# Patient Record
Sex: Female | Born: 1955
Health system: Southern US, Community
[De-identification: ages and names within clinical notes are randomized; demographics above are authoritative.]

## PROBLEM LIST (undated history)

## (undated) DIAGNOSIS — M069 Rheumatoid arthritis, unspecified: Secondary | ICD-10-CM

## (undated) DIAGNOSIS — R202 Paresthesia of skin: Secondary | ICD-10-CM

## (undated) DIAGNOSIS — G709 Myoneural disorder, unspecified: Secondary | ICD-10-CM

## (undated) DIAGNOSIS — F419 Anxiety disorder, unspecified: Secondary | ICD-10-CM

## (undated) DIAGNOSIS — K644 Residual hemorrhoidal skin tags: Secondary | ICD-10-CM

## (undated) DIAGNOSIS — M1990 Unspecified osteoarthritis, unspecified site: Secondary | ICD-10-CM

## (undated) DIAGNOSIS — I509 Heart failure, unspecified: Secondary | ICD-10-CM

## (undated) DIAGNOSIS — R42 Dizziness and giddiness: Secondary | ICD-10-CM

## (undated) DIAGNOSIS — K219 Gastro-esophageal reflux disease without esophagitis: Secondary | ICD-10-CM

## (undated) DIAGNOSIS — E785 Hyperlipidemia, unspecified: Secondary | ICD-10-CM

## (undated) DIAGNOSIS — D649 Anemia, unspecified: Secondary | ICD-10-CM

## (undated) DIAGNOSIS — J45909 Unspecified asthma, uncomplicated: Secondary | ICD-10-CM

## (undated) DIAGNOSIS — I10 Essential (primary) hypertension: Secondary | ICD-10-CM

## (undated) DIAGNOSIS — I429 Cardiomyopathy, unspecified: Secondary | ICD-10-CM

## (undated) DIAGNOSIS — R200 Anesthesia of skin: Secondary | ICD-10-CM

## (undated) DIAGNOSIS — E119 Type 2 diabetes mellitus without complications: Secondary | ICD-10-CM

## (undated) DIAGNOSIS — R350 Frequency of micturition: Secondary | ICD-10-CM

## (undated) HISTORY — DX: Heart failure, unspecified: I50.9

## (undated) HISTORY — PX: ABDOMINAL HYSTERECTOMY: SHX81

## (undated) HISTORY — DX: Type 2 diabetes mellitus without complications: E11.9

## (undated) HISTORY — PX: COLONOSCOPY: SHX174

## (undated) HISTORY — DX: Essential (primary) hypertension: I10

## (undated) HISTORY — DX: Unspecified asthma, uncomplicated: J45.909

## (undated) HISTORY — PX: CARPAL TUNNEL RELEASE: SHX101

## (undated) HISTORY — DX: Myoneural disorder, unspecified: G70.9

## (undated) HISTORY — PX: TUBAL LIGATION: SHX77

## (undated) HISTORY — DX: Cardiomyopathy, unspecified: I42.9

## (undated) HISTORY — PX: COLONOSCOPY W/ POLYPECTOMY: SHX1380

## (undated) HISTORY — PX: HEMORROIDECTOMY: SUR656

## (undated) HISTORY — DX: Hyperlipidemia, unspecified: E78.5

## (undated) HISTORY — DX: Gastro-esophageal reflux disease without esophagitis: K21.9

## (undated) HISTORY — PX: CYST REMOVAL NECK: SHX6281

---

## 2001-08-18 LAB — HM DIABETES EYE EXAM

## 2013-01-15 ENCOUNTER — Telehealth: Payer: Self-pay | Admitting: Internal Medicine

## 2013-01-15 NOTE — Telephone Encounter (Signed)
Received records from Dr.Hoshino;sending 71 pages to Dr.Jones

## 2013-01-30 ENCOUNTER — Ambulatory Visit (INDEPENDENT_AMBULATORY_CARE_PROVIDER_SITE_OTHER): Payer: BC Managed Care – PPO

## 2013-01-30 ENCOUNTER — Ambulatory Visit (INDEPENDENT_AMBULATORY_CARE_PROVIDER_SITE_OTHER): Payer: BC Managed Care – PPO | Admitting: Internal Medicine

## 2013-01-30 ENCOUNTER — Encounter: Payer: Self-pay | Admitting: Internal Medicine

## 2013-01-30 VITALS — BP 128/86 | HR 80 | Temp 98.6°F | Resp 16 | Ht 65.0 in | Wt 221.0 lb

## 2013-01-30 DIAGNOSIS — I10 Essential (primary) hypertension: Secondary | ICD-10-CM

## 2013-01-30 DIAGNOSIS — E785 Hyperlipidemia, unspecified: Secondary | ICD-10-CM

## 2013-01-30 DIAGNOSIS — E876 Hypokalemia: Secondary | ICD-10-CM

## 2013-01-30 DIAGNOSIS — IMO0002 Reserved for concepts with insufficient information to code with codable children: Secondary | ICD-10-CM

## 2013-01-30 DIAGNOSIS — Z23 Encounter for immunization: Secondary | ICD-10-CM

## 2013-01-30 DIAGNOSIS — M48061 Spinal stenosis, lumbar region without neurogenic claudication: Secondary | ICD-10-CM | POA: Insufficient documentation

## 2013-01-30 DIAGNOSIS — Z860100 Personal history of colon polyps, unspecified: Secondary | ICD-10-CM | POA: Insufficient documentation

## 2013-01-30 DIAGNOSIS — M5416 Radiculopathy, lumbar region: Secondary | ICD-10-CM

## 2013-01-30 DIAGNOSIS — F1220 Cannabis dependence, uncomplicated: Secondary | ICD-10-CM

## 2013-01-30 DIAGNOSIS — E118 Type 2 diabetes mellitus with unspecified complications: Secondary | ICD-10-CM | POA: Insufficient documentation

## 2013-01-30 DIAGNOSIS — IMO0001 Reserved for inherently not codable concepts without codable children: Secondary | ICD-10-CM

## 2013-01-30 DIAGNOSIS — N1832 Chronic kidney disease, stage 3b: Secondary | ICD-10-CM | POA: Insufficient documentation

## 2013-01-30 DIAGNOSIS — Z87898 Personal history of other specified conditions: Secondary | ICD-10-CM

## 2013-01-30 LAB — CBC WITH DIFFERENTIAL/PLATELET
Basophils Absolute: 0.1 10*3/uL (ref 0.0–0.1)
Eosinophils Absolute: 0.2 10*3/uL (ref 0.0–0.7)
HCT: 35.7 % — ABNORMAL LOW (ref 36.0–46.0)
Lymphs Abs: 2.7 10*3/uL (ref 0.7–4.0)
MCV: 92.3 fl (ref 78.0–100.0)
Monocytes Absolute: 0.8 10*3/uL (ref 0.1–1.0)
Platelets: 260 10*3/uL (ref 150.0–400.0)
RDW: 14.1 % (ref 11.5–14.6)

## 2013-01-30 LAB — URINALYSIS, ROUTINE W REFLEX MICROSCOPIC
Specific Gravity, Urine: >=1.03 (ref 1.000–1.030)
Total Protein, Urine: NEGATIVE
Urine Glucose: NEGATIVE
Urobilinogen, UA: 0.2 (ref 0.0–1.0)

## 2013-01-30 LAB — LIPID PANEL
Cholesterol: 245 mg/dL — ABNORMAL HIGH (ref 0–200)
Triglycerides: 197 mg/dL — ABNORMAL HIGH (ref 0.0–149.0)

## 2013-01-30 LAB — COMPREHENSIVE METABOLIC PANEL
ALT: 23 U/L (ref 0–35)
Alkaline Phosphatase: 85 U/L (ref 39–117)
Sodium: 140 mEq/L (ref 135–145)
Total Bilirubin: 0.6 mg/dL (ref 0.3–1.2)
Total Protein: 7.4 g/dL (ref 6.0–8.3)

## 2013-01-30 LAB — MICROALBUMIN / CREATININE URINE RATIO: Microalb Creat Ratio: 0.9 mg/g (ref 0.0–30.0)

## 2013-01-30 LAB — HEMOGLOBIN A1C: Hgb A1c MFr Bld: 6.8 % — ABNORMAL HIGH (ref 4.6–6.5)

## 2013-01-30 LAB — LDL CHOLESTEROL, DIRECT: Direct LDL: 162.5 mg/dL

## 2013-01-30 MED ORDER — HYDROCODONE-ACETAMINOPHEN 5-325 MG PO TABS: 1.0000 | ORAL_TABLET | Freq: Four times a day (QID) | ORAL | Status: DC | PRN

## 2013-01-30 NOTE — Progress Notes (Signed)
Subjective:    Patient ID: Emily Wheeler, female    DOB: May 11, 1956, 57 y.o.   MRN: [id number]  Back Pain This is a chronic problem. The current episode started more than 1 year ago. The problem occurs intermittently. The problem has been gradually worsening since onset. The pain is present in the lumbar spine. The quality of the pain is described as burning, aching, shooting and stabbing. The pain radiates to the left thigh. The pain is at a severity of 5/10. The pain is moderate. The pain is worse during the day. The symptoms are aggravated by bending, position, standing and twisting. Associated symptoms include leg pain (left), numbness (left foot) and paresthesias (LLE). Pertinent negatives include no abdominal pain, bladder incontinence, bowel incontinence, chest pain, dysuria, fever, headaches, paresis, pelvic pain, perianal numbness, tingling, weakness or weight loss. She has tried NSAIDs and muscle relaxant for the symptoms. The treatment provided mild relief.      Review of Systems  Constitutional: Negative.  Negative for fever, chills, weight loss, diaphoresis, activity change, appetite change and fatigue.  HENT: Negative.   Eyes: Negative.   Respiratory: Negative.  Negative for cough, choking, chest tightness, shortness of breath, wheezing and stridor.   Cardiovascular: Negative for chest pain, palpitations and leg swelling.  Gastrointestinal: Negative.  Negative for nausea, vomiting, abdominal pain, diarrhea, constipation, blood in stool and bowel incontinence.  Endocrine: Negative.  Negative for polydipsia, polyphagia and polyuria.  Genitourinary: Negative.  Negative for bladder incontinence, dysuria and pelvic pain.  Musculoskeletal: Positive for back pain. Negative for myalgias, joint swelling, arthralgias and gait problem.  Skin: Negative.   Allergic/Immunologic: Negative.   Neurological: Positive for numbness (left foot) and paresthesias (LLE). Negative for dizziness,  tingling, tremors, seizures, syncope, facial asymmetry, speech difficulty, weakness, light-headedness and headaches.  Hematological: Negative.  Negative for adenopathy. Does not bruise/bleed easily.  Psychiatric/Behavioral: Negative.        Objective:   Physical Exam  Vitals reviewed. Constitutional: She is oriented to person, place, and time. She appears well-developed and well-nourished. No distress.  HENT:  Head: Normocephalic and atraumatic.  Mouth/Throat: Oropharynx is clear and moist. No oropharyngeal exudate.  Eyes: Conjunctivae are normal. Right eye exhibits no discharge. Left eye exhibits no discharge. No scleral icterus.  Neck: Normal range of motion. Neck supple. No JVD present. No tracheal deviation present. No thyromegaly present.  Cardiovascular: Normal rate, regular rhythm, normal heart sounds and intact distal pulses.  Exam reveals no gallop and no friction rub.   No murmur heard. Pulmonary/Chest: Effort normal and breath sounds normal. No stridor. No respiratory distress. She has no wheezes. She has no rales. She exhibits no tenderness.  Abdominal: Soft. Bowel sounds are normal. She exhibits no distension and no mass. There is no tenderness. There is no rebound and no guarding.  Musculoskeletal: Normal range of motion. She exhibits no edema and no tenderness.       Lumbar back: Normal. She exhibits normal range of motion, no tenderness, no bony tenderness, no swelling, no edema, no deformity, no laceration, no pain, no spasm and normal pulse.  Lymphadenopathy:    She has no cervical adenopathy.  Neurological: She is alert and oriented to person, place, and time. She has normal strength. She displays no atrophy, no tremor and normal reflexes. No cranial nerve deficit or sensory deficit. She exhibits normal muscle tone. She displays a negative Romberg sign. She displays no seizure activity. Coordination and gait normal. She displays no Babinski's sign on the right side.  She  displays no Babinski's sign on the left side.  Reflex Scores:      Tricep reflexes are 1+ on the right side and 1+ on the left side.      Bicep reflexes are 1+ on the right side and 1+ on the left side.      Brachioradialis reflexes are 1+ on the right side and 1+ on the left side.      Patellar reflexes are 1+ on the right side and 1+ on the left side.      Achilles reflexes are 1+ on the right side and 1+ on the left side. Neg SLR in BLE  Skin: Skin is warm and dry. No rash noted. She is not diaphoretic. No erythema. No pallor.  Psychiatric: She has a normal mood and affect. Her behavior is normal. Judgment and thought content normal.     No results found for this basename: WBC, HGB, HCT, PLT, GLUCOSE, CHOL, TRIG, HDL, LDLDIRECT, LDLCALC, ALT, AST, NA, K, CL, CREATININE, BUN, CO2, TSH, PSA, INR, GLUF, HGBA1C, MICROALBUR       Assessment & Plan:

## 2013-01-30 NOTE — Patient Instructions (Signed)
Back Pain, Adult  Low back pain is very common. About 1 in 5 people have back pain. The cause of low back pain is rarely dangerous. The pain often gets better over time. About half of people with a sudden onset of back pain feel better in just 2 weeks. About 8 in 10 people feel better by 6 weeks.   CAUSES  Some common causes of back pain include:  · Strain of the muscles or ligaments supporting the spine.  · Wear and tear (degeneration) of the spinal discs.  · Arthritis.  · Direct injury to the back.  DIAGNOSIS  Most of the time, the direct cause of low back pain is not known. However, back pain can be treated effectively even when the exact cause of the pain is unknown. Answering your caregiver's questions about your overall health and symptoms is one of the most accurate ways to make sure the cause of your pain is not dangerous. If your caregiver needs more information, he or she may order lab work or imaging tests (X-rays or MRIs). However, even if imaging tests show changes in your back, this usually does not require surgery.  HOME CARE INSTRUCTIONS  For many people, back pain returns. Since low back pain is rarely dangerous, it is often a condition that people can learn to manage on their own.   · Remain active. It is stressful on the back to sit or stand in one place. Do not sit, drive, or stand in one place for more than 30 minutes at a time. Take short walks on level surfaces as soon as pain allows. Try to increase the length of time you walk each day.  · Do not stay in bed. Resting more than 1 or 2 days can delay your recovery.  · Do not avoid exercise or work. Your body is made to move. It is not dangerous to be active, even though your back may hurt. Your back will likely heal faster if you return to being active before your pain is gone.  · Pay attention to your body when you  bend and lift. Many people have less discomfort when lifting if they bend their knees, keep the load close to their bodies, and  avoid twisting. Often, the most comfortable positions are those that put less stress on your recovering back.  · Find a comfortable position to sleep. Use a firm mattress and lie on your side with your knees slightly bent. If you lie on your back, put a pillow under your knees.  · Only take over-the-counter or prescription medicines as directed by your caregiver. Over-the-counter medicines to reduce pain and inflammation are often the most helpful. Your caregiver may prescribe muscle relaxant drugs. These medicines help dull your pain so you can more quickly return to your normal activities and healthy exercise.  · Put ice on the injured area.  · Put ice in a plastic bag.  · Place a towel between your skin and the bag.  · Leave the ice on for 15-20 minutes, 3-4 times a day for the first 2 to 3 days. After that, ice and heat may be alternated to reduce pain and spasms.  · Ask your caregiver about trying back exercises and gentle massage. This may be of some benefit.  · Avoid feeling anxious or stressed. Stress increases muscle tension and can worsen back pain. It is important to recognize when you are anxious or stressed and learn ways to manage it. Exercise is a great option.  SEEK MEDICAL CARE IF:  · You have pain that is not relieved with rest or   medicine.  · You have pain that does not improve in 1 week.  · You have new symptoms.  · You are generally not feeling well.  SEEK IMMEDIATE MEDICAL CARE IF:   · You have pain that radiates from your back into your legs.  · You develop new bowel or bladder control problems.  · You have unusual weakness or numbness in your arms or legs.  · You develop nausea or vomiting.  · You develop abdominal pain.  · You feel faint.  Document Released: 05/14/2005 Document Revised: 11/13/2011 Document Reviewed: 10/02/2010  Exit[employer]® Patient Information ©2014 [employer].

## 2013-01-31 ENCOUNTER — Encounter: Payer: Self-pay | Admitting: Internal Medicine

## 2013-01-31 DIAGNOSIS — Z23 Encounter for immunization: Secondary | ICD-10-CM | POA: Insufficient documentation

## 2013-01-31 DIAGNOSIS — E876 Hypokalemia: Secondary | ICD-10-CM | POA: Insufficient documentation

## 2013-01-31 DIAGNOSIS — F1220 Cannabis dependence, uncomplicated: Secondary | ICD-10-CM | POA: Insufficient documentation

## 2013-01-31 LAB — DRUGS OF ABUSE SCREEN W/O ALC, ROUTINE URINE
Amphetamine Screen, Ur: NEGATIVE
Barbiturate Quant, Ur: NEGATIVE
Benzodiazepines.: NEGATIVE
Cocaine Metabolites: NEGATIVE
Phencyclidine (PCP): NEGATIVE

## 2013-01-31 MED ORDER — POTASSIUM CHLORIDE CRYS ER 20 MEQ PO TBCR: 20.0000 meq | EXTENDED_RELEASE_TABLET | Freq: Three times a day (TID) | ORAL | Status: DC

## 2013-01-31 NOTE — Assessment & Plan Note (Signed)
Her A1C shows good control and her renal function is ok She needs to have an eye exam done

## 2013-01-31 NOTE — Assessment & Plan Note (Signed)
She has radicular s/s so I have ordered an MRI to see if she has HNP, mass/tumor, spinal stenosis, nerve impingement She will add norco for additional pain relief

## 2013-01-31 NOTE — Assessment & Plan Note (Signed)
She is not interested in cessation

## 2013-01-31 NOTE — Assessment & Plan Note (Signed)
I have asked her to get a f/up cononscopy

## 2013-01-31 NOTE — Assessment & Plan Note (Signed)
This is due to the HCTZ She will start K+ replacement therapy

## 2013-01-31 NOTE — Assessment & Plan Note (Signed)
F/up mammogram ordered

## 2013-01-31 NOTE — Assessment & Plan Note (Signed)
Her BP is well controlled Lytes and renal function are normal 

## 2013-02-02 ENCOUNTER — Telehealth: Payer: Self-pay | Admitting: Internal Medicine

## 2013-02-02 NOTE — Telephone Encounter (Signed)
rec'd records from [facility], Forwarding 11pgs to Dr.Jones

## 2013-02-03 LAB — CANNABANOIDS (GC/LC/MS), URINE: THC-COOH (GC/LC/MS), ur confirm: 958 ng/mL

## 2013-02-08 ENCOUNTER — Ambulatory Visit
Admission: RE | Admit: 2013-02-08 | Discharge: 2013-02-08 | Disposition: A | Discharge status: Home / Self Care | Payer: BC Managed Care – PPO | Source: Ambulatory Visit | Attending: Internal Medicine | Admitting: Internal Medicine

## 2013-02-08 DIAGNOSIS — M5416 Radiculopathy, lumbar region: Secondary | ICD-10-CM

## 2013-02-09 ENCOUNTER — Other Ambulatory Visit: Payer: Self-pay | Admitting: Internal Medicine

## 2013-02-09 ENCOUNTER — Encounter: Payer: Self-pay | Admitting: Internal Medicine

## 2013-02-09 ENCOUNTER — Telehealth: Payer: Self-pay

## 2013-02-09 DIAGNOSIS — Z87898 Personal history of other specified conditions: Secondary | ICD-10-CM

## 2013-02-09 DIAGNOSIS — M5416 Radiculopathy, lumbar region: Secondary | ICD-10-CM

## 2013-02-09 MED ORDER — NIFEDIPINE ER OSMOTIC RELEASE 30 MG PO TB24: 30.0000 mg | ORAL_TABLET | Freq: Every day | ORAL | Status: DC

## 2013-02-09 MED ORDER — ATENOLOL 100 MG PO TABS: 100.0000 mg | ORAL_TABLET | Freq: Every day | ORAL | Status: DC

## 2013-02-09 NOTE — Telephone Encounter (Signed)
[facility] w/ [facility] called [facility] stating that original order for mammogram must be changed to diagnostic in order to scheduled. Per [facility], due to hx of abnormal mammogram, they will need to know where last scan was done an d need report.   Order has been changed, routing message to [facility] for other information needed for scheduling. Thanks

## 2013-02-18 ENCOUNTER — Encounter: Payer: Self-pay | Admitting: Gastroenterology

## 2013-02-27 ENCOUNTER — Encounter: Payer: Self-pay | Admitting: Internal Medicine

## 2013-02-27 ENCOUNTER — Ambulatory Visit (INDEPENDENT_AMBULATORY_CARE_PROVIDER_SITE_OTHER): Payer: BC Managed Care – PPO | Admitting: Internal Medicine

## 2013-02-27 VITALS — BP 148/78 | HR 78 | Temp 98.4°F | Resp 16 | Ht 65.0 in | Wt 216.0 lb

## 2013-02-27 DIAGNOSIS — L089 Local infection of the skin and subcutaneous tissue, unspecified: Secondary | ICD-10-CM | POA: Insufficient documentation

## 2013-02-27 DIAGNOSIS — IMO0001 Reserved for inherently not codable concepts without codable children: Secondary | ICD-10-CM

## 2013-02-27 DIAGNOSIS — L723 Sebaceous cyst: Secondary | ICD-10-CM

## 2013-02-27 DIAGNOSIS — M069 Rheumatoid arthritis, unspecified: Secondary | ICD-10-CM

## 2013-02-27 MED ORDER — LEFLUNOMIDE 20 MG PO TABS: 20.0000 mg | ORAL_TABLET | Freq: Every day | ORAL | Status: DC

## 2013-02-27 NOTE — Assessment & Plan Note (Signed)
It does not appear to be infected GS referral for exicsion

## 2013-02-27 NOTE — Assessment & Plan Note (Signed)
She will continue the current meds for now I have asker her to establish with a rheumatologist here

## 2013-02-27 NOTE — Assessment & Plan Note (Signed)
Her blood sugars are well controlled 

## 2013-02-27 NOTE — Progress Notes (Signed)
Subjective:    Patient ID: Emily Wheeler, female    DOB: [birth year], 57 y.o.   MRN: [id number]  Arthritis Presents for follow-up visit. The disease course has been stable. She complains of pain. She reports no stiffness, joint swelling or joint warmth. Affected locations include the left elbow and right elbow. Her pain is at a severity of 2/10. Associated symptoms include pain at night and pain while resting. Pertinent negatives include no diarrhea, dry eyes, dry mouth, dysuria, fatigue, fever, rash, Raynaud's syndrome, uveitis or weight loss. Her past medical history is significant for rheumatoid arthritis. (She has been seeing Dr. Juanetta Snow in [place], [place]) Past treatments include an opioid Ranae Plumber). The treatment provided significant relief. Factors aggravating her arthritis include activity. Compliance with prior treatments has been good.      Review of Systems  Constitutional: Negative.  Negative for fever, chills, weight loss, diaphoresis, activity change, appetite change, fatigue and unexpected weight change.  HENT: Negative.        She has a lump on the back of her right neck that she wants removed.  Eyes: Negative.   Respiratory: Negative.  Negative for cough, chest tightness, shortness of breath, wheezing and stridor.   Cardiovascular: Negative.  Negative for chest pain, palpitations and leg swelling.  Gastrointestinal: Negative.  Negative for nausea, vomiting, abdominal pain and diarrhea.  Endocrine: Negative.   Genitourinary: Negative.  Negative for dysuria.  Musculoskeletal: Positive for back pain and arthritis. Negative for myalgias, joint swelling, gait problem and stiffness.  Skin: Negative.  Negative for rash.  Allergic/Immunologic: Negative.   Neurological: Negative.   Hematological: Negative.  Negative for adenopathy. Does not bruise/bleed easily.  Psychiatric/Behavioral: Negative.        Objective:   Physical Exam  Vitals reviewed. Constitutional: She is oriented to  person, place, and time. She appears well-developed and well-nourished. No distress.  HENT:  Head: Normocephalic and atraumatic.  Mouth/Throat: Oropharynx is clear and moist. No oropharyngeal exudate.  Eyes: Conjunctivae are normal. Right eye exhibits no discharge. Left eye exhibits no discharge. No scleral icterus.  Neck: Normal range of motion. Neck supple. No JVD present. No tracheal deviation present. No mass and no thyromegaly present.    Cardiovascular: Normal rate, regular rhythm, normal heart sounds and intact distal pulses.  Exam reveals no gallop and no friction rub.   No murmur heard. Pulmonary/Chest: Effort normal and breath sounds normal. No stridor. No respiratory distress. She has no wheezes. She has no rales. She exhibits no tenderness.  Abdominal: Soft. Bowel sounds are normal. She exhibits no distension and no mass. There is no tenderness. There is no rebound and no guarding.  Musculoskeletal: Normal range of motion. She exhibits no edema and no tenderness.  Lymphadenopathy:    She has no cervical adenopathy.  Neurological: She is oriented to person, place, and time.  Skin: Skin is warm and dry. No rash noted. She is not diaphoretic. No erythema. No pallor.     Lab Results  Component Value Date   WBC 5.9 01/30/2013   HGB 12.0 01/30/2013   HCT 35.7* 01/30/2013   PLT 260.0 01/30/2013   GLUCOSE 109* 01/30/2013   CHOL 245* 01/30/2013   TRIG 197.0* 01/30/2013   HDL 47.60 01/30/2013   LDLDIRECT 162.5 01/30/2013   ALT 23 01/30/2013   AST 22 01/30/2013   NA 140 01/30/2013   K 3.1* 01/30/2013   CL 103 01/30/2013   CREATININE 0.9 01/30/2013   BUN 14 01/30/2013   CO2 30 01/30/2013  TSH 1.69 01/30/2013   HGBA1C 6.8* 01/30/2013   MICROALBUR 3.0* 01/30/2013        Assessment & Plan:

## 2013-02-27 NOTE — Patient Instructions (Signed)
Type 2 Diabetes Mellitus, Adult Type 2 diabetes mellitus, often simply referred to as type 2 diabetes, is a long-lasting (chronic) disease. In type 2 diabetes, the pancreas does not make enough insulin (a hormone), the cells are less responsive to the insulin that is made (insulin resistance), or both. Normally, insulin moves sugars from food into the tissue cells. The tissue cells use the sugars for energy. The lack of insulin or the lack of normal response to insulin causes excess sugars to build up in the blood instead of going into the tissue cells. As a result, high blood sugar (hyperglycemia) develops. The effect of high sugar (glucose) levels can cause many complications. Type 2 diabetes was also previously called adult-onset diabetes but it can occur at any age.  RISK FACTORS  A person is predisposed to developing type 2 diabetes if someone in the family has the disease and also has one or more of the following primary risk factors:  Overweight.  An inactive lifestyle.  A history of consistently eating high-calorie foods. Maintaining a normal weight and regular physical activity can reduce the chance of developing type 2 diabetes. SYMPTOMS  A person with type 2 diabetes may not show symptoms initially. The symptoms of type 2 diabetes appear slowly. The symptoms include:  Increased thirst (polydipsia).  Increased urination (polyuria).  Increased urination during the night (nocturia).  Weight loss. This weight loss may be rapid.  Frequent, recurring infections.  Tiredness (fatigue).  Weakness.  Vision changes, such as blurred vision.  Fruity smell to your breath.  Abdominal pain.  Nausea or vomiting.  Cuts or bruises which are slow to heal.  Tingling or numbness in the hands or feet. DIAGNOSIS Type 2 diabetes is frequently not diagnosed until complications of diabetes are present. Type 2 diabetes is diagnosed when symptoms or complications are present and when blood  glucose levels are increased. Your blood glucose level may be checked by one or more of the following blood tests:  A fasting blood glucose test. You will not be allowed to eat for at least 8 hours before a blood sample is taken.  A random blood glucose test. Your blood glucose is checked at any time of the day regardless of when you ate.  A hemoglobin A1c blood glucose test. A hemoglobin A1c test provides information about blood glucose control over the previous 3 months.  An oral glucose tolerance test (OGTT). Your blood glucose is measured after you have not eaten (fasted) for 2 hours and then after you drink a glucose-containing beverage. TREATMENT   You may need to take insulin or diabetes medicine daily to keep blood glucose levels in the desired range.  You will need to match insulin dosing with exercise and healthy food choices. The treatment goal is to maintain the before meal blood sugar (preprandial glucose) level at 70 130 mg/dL. HOME CARE INSTRUCTIONS   Have your hemoglobin A1c level checked twice a year.  Perform daily blood glucose monitoring as directed by your caregiver.  Monitor urine ketones when you are ill and as directed by your caregiver.  Take your diabetes medicine or insulin as directed by your caregiver to maintain your blood glucose levels in the desired range.  Never run out of diabetes medicine or insulin. It is needed every day.  Adjust insulin based on your intake of carbohydrates. Carbohydrates can raise blood glucose levels but need to be included in your diet. Carbohydrates provide vitamins, minerals, and fiber which are an essential part of   a healthy diet. Carbohydrates are found in fruits, vegetables, whole grains, dairy products, legumes, and foods containing added sugars.    Eat healthy foods. Alternate 3 meals with 3 snacks.  Lose weight if overweight.  Carry a medical alert card or wear your medical alert jewelry.  Carry a 15 gram  carbohydrate snack with you at all times to treat low blood glucose (hypoglycemia). Some examples of 15 gram carbohydrate snacks include:  Glucose tablets, 3 or 4   Glucose gel, 15 gram tube  Raisins, 2 tablespoons (24 grams)  Jelly beans, 6  Animal crackers, 8  Regular pop, 4 ounces (120 mL)  Gummy treats, 9  Recognize hypoglycemia. Hypoglycemia occurs with blood glucose levels of 70 mg/dL and below. The risk for hypoglycemia increases when fasting or skipping meals, during or after intense exercise, and during sleep. Hypoglycemia symptoms can include:  Tremors or shakes.  Decreased ability to concentrate.  Sweating.  Increased heart rate.  Headache.  Dry mouth.  Hunger.  Irritability.  Anxiety.  Restless sleep.  Altered speech or coordination.  Confusion.  Treat hypoglycemia promptly. If you are alert and able to safely swallow, follow the 15:15 rule:  Take 15 20 grams of rapid-acting glucose or carbohydrate. Rapid-acting options include glucose gel, glucose tablets, or 4 ounces (120 mL) of fruit juice, regular soda, or low fat milk.  Check your blood glucose level 15 minutes after taking the glucose.  Take 15 20 grams more of glucose if the repeat blood glucose level is still 70 mg/dL or below.  Eat a meal or snack within 1 hour once blood glucose levels return to normal.    Be alert to polyuria and polydipsia which are early signs of hyperglycemia. An early awareness of hyperglycemia allows for prompt treatment. Treat hyperglycemia as directed by your caregiver.  Engage in at least 150 minutes of moderate-intensity physical activity a week, spread over at least 3 days of the week or as directed by your caregiver. In addition, you should engage in resistance exercise at least 2 times a week or as directed by your caregiver.  Adjust your medicine and food intake as needed if you start a new exercise or sport.  Follow your sick day plan at any time you  are unable to eat or drink as usual.  Avoid tobacco use.  Limit alcohol intake to no more than 1 drink per day for nonpregnant women and 2 drinks per day for men. You should drink alcohol only when you are also eating food. Talk with your caregiver whether alcohol is safe for you. Tell your caregiver if you drink alcohol several times a week.  Follow up with your caregiver regularly.  Schedule an eye exam soon after the diagnosis of type 2 diabetes and then annually.  Perform daily skin and foot care. Examine your skin and feet daily for cuts, bruises, redness, nail problems, bleeding, blisters, or sores. A foot exam by a caregiver should be done annually.  Brush your teeth and gums at least twice a day and floss at least once a day. Follow up with your dentist regularly.  Share your diabetes management plan with your workplace or school.  Stay up-to-date with immunizations.  Learn to manage stress.  Obtain ongoing diabetes education and support as needed.  Participate in, or seek rehabilitation as needed to maintain or improve independence and quality of life. Request a physical or occupational therapy referral if you are having foot or hand numbness or difficulties with grooming,   dressing, eating, or physical activity. SEEK MEDICAL CARE IF:   You are unable to eat food or drink fluids for more than 6 hours.  You have nausea and vomiting for more than 6 hours.  Your blood glucose level is over 240 mg/dL.  There is a change in mental status.  You develop an additional serious illness.  You have diarrhea for more than 6 hours.  You have been sick or have had a fever for a couple of days and are not getting better.  You have pain during any physical activity.  SEEK IMMEDIATE MEDICAL CARE IF:  You have difficulty breathing.  You have moderate to large ketone levels. MAKE SURE YOU:  Understand these instructions.  Will watch your condition.  Will get help right away if  you are not doing well or get worse. Document Released: 05/14/2005 Document Revised: 02/06/2012 Document Reviewed: 12/11/2011 [employer] Patient Information 2014 [employer].  

## 2013-03-04 ENCOUNTER — Ambulatory Visit (INDEPENDENT_AMBULATORY_CARE_PROVIDER_SITE_OTHER): Payer: BC Managed Care – PPO | Admitting: General Surgery

## 2013-03-27 ENCOUNTER — Encounter (INDEPENDENT_AMBULATORY_CARE_PROVIDER_SITE_OTHER): Payer: Self-pay | Admitting: General Surgery

## 2013-03-27 ENCOUNTER — Ambulatory Visit (INDEPENDENT_AMBULATORY_CARE_PROVIDER_SITE_OTHER): Payer: BC Managed Care – PPO | Admitting: General Surgery

## 2013-03-27 VITALS — BP 140/80 | HR 74 | Temp 97.4°F | Resp 16 | Ht 65.0 in | Wt 212.8 lb

## 2013-03-27 DIAGNOSIS — L720 Epidermal cyst: Secondary | ICD-10-CM

## 2013-03-27 DIAGNOSIS — L723 Sebaceous cyst: Secondary | ICD-10-CM

## 2013-03-27 NOTE — Patient Instructions (Signed)
Stop taking aspirin and fish oil 5 days prior to procedure  Epidermal Cyst An epidermal cyst is sometimes called a sebaceous cyst, epidermal inclusion cyst, or infundibular cyst. These cysts usually contain a substance that looks "pasty" or "cheesy" and may have a bad smell. This substance is a protein called keratin. Epidermal cysts are usually found on the face, neck, or trunk. They may also occur in the vaginal area or other parts of the genitalia of both men and women. Epidermal cysts are usually small, painless, slow-growing bumps or lumps that move freely under the skin. It is important not to try to pop them. This may cause an infection and lead to tenderness and swelling. CAUSES  Epidermal cysts may be caused by a deep penetrating injury to the skin or a plugged hair follicle, often associated with acne. SYMPTOMS  Epidermal cysts can become inflamed and cause:  Redness.  Tenderness.  Increased temperature of the skin over the bumps or lumps.  Grayish-white, bad smelling material that drains from the bump or lump. DIAGNOSIS  Epidermal cysts are easily diagnosed by your caregiver during an exam. Rarely, a tissue sample (biopsy) may be taken to rule out other conditions that may resemble epidermal cysts. TREATMENT   Epidermal cysts often get better and disappear on their own. They are rarely ever cancerous.  If a cyst becomes infected, it may become inflamed and tender. This may require opening and draining the cyst. Treatment with antibiotics may be necessary. When the infection is gone, the cyst may be removed with minor surgery.  Small, inflamed cysts can often be treated with antibiotics or by injecting steroid medicines.  Sometimes, epidermal cysts become large and bothersome. If this happens, surgical removal in your caregiver's office may be necessary. HOME CARE INSTRUCTIONS  Only take over-the-counter or prescription medicines as directed by your caregiver.  Take your  antibiotics as directed. Finish them even if you start to feel better. SEEK MEDICAL CARE IF:   Your cyst becomes tender, red, or swollen.  Your condition is not improving or is getting worse.  You have any other questions or concerns. MAKE SURE YOU:  Understand these instructions.  Will watch your condition.  Will get help right away if you are not doing well or get worse. Document Released: 04/14/2004 Document Revised: 08/06/2011 Document Reviewed: 11/20/2010 [facility] Patient Information 2014 [place], [place].

## 2013-03-27 NOTE — Progress Notes (Signed)
Patient ID: Emily Wheeler, female   DOB: June 08, 1955, 57 y.o.   MRN: [id number]  Chief Complaint  Patient presents with  . New Evaluation    eval seb cyst on neck    HPI Emily Wheeler is a 57 y.o. female.   HPI 57 yo AAF referred by Dr Sanda Linger for evaluation of a posterior neck cyst. The patient states that it has been present for many years. It doesn't cause her any pain or discomfort; however, it drains occasionally and has a real bad odor to it. She denies any other similar soft tissue masses. She states it'll change in size. She denies any fever, chills, night sweats. She is going through menopause. She desires surgical excision. Past Medical History  Diagnosis Date  . Asthma   . Diabetes mellitus without complication   . Hypertension   . Hyperlipidemia     Past Surgical History  Procedure Laterality Date  . Tubal ligation    . Abdominal hysterectomy      Family History  Problem Relation Age of Onset  . Early death Father   . Heart disease Father   . Hypertension Sister   . Hypertension Brother   . Diabetes Brother   . Alcohol abuse Neg Hx   . Cancer Neg Hx   . COPD Neg Hx   . Depression Neg Hx   . Drug abuse Neg Hx   . Hearing loss Neg Hx   . Hyperlipidemia Neg Hx   . Kidney disease Neg Hx   . Stroke Neg Hx     Social History History  Substance Use Topics  . Smoking status: Former Games developer  . Smokeless tobacco: Former Neurosurgeon    Quit date: 01/30/1993  . Alcohol Use: 1.8 oz/week    3 Glasses of wine per week    No Known Allergies  Current Outpatient Prescriptions  Medication Sig Dispense Refill  . atenolol (TENORMIN) 100 MG tablet Take 1 tablet (100 mg total) by mouth daily.  90 tablet  3  . Chromium-Cinnamon [phone] MCG-MG CAPS Take by mouth.      . citalopram (CELEXA) 20 MG tablet Take 20 mg by mouth daily.      Marland Kitchen gabapentin (NEURONTIN) 600 MG tablet Take 600 mg by mouth 3 (three) times daily.      . hydrochlorothiazide (HYDRODIURIL) 25 MG tablet  Take 25 mg by mouth daily.      Marland Kitchen leflunomide (ARAVA) 20 MG tablet Take 1 tablet (20 mg total) by mouth daily.  90 tablet  0  . lisinopril (PRINIVIL,ZESTRIL) 20 MG tablet Take 20 mg by mouth daily.      . metFORMIN (GLUCOPHAGE) 500 MG tablet Take 500 mg by mouth 2 (two) times daily with a meal.      . Multiple Vitamin (MULTIVITAMIN) tablet Take 1 tablet by mouth daily.      Marland Kitchen NIFEdipine (PROCARDIA-XL/ADALAT-CC/NIFEDICAL-XL) 30 MG 24 hr tablet Take 1 tablet (30 mg total) by mouth daily.  90 tablet  3  . Omega-3 Fatty Acids (FISH OIL) 1200 MG CAPS Take by mouth.      . potassium chloride SA (K-DUR,KLOR-CON) 20 MEQ tablet Take 1 tablet (20 mEq total) by mouth 3 (three) times daily.  90 tablet  3  . HYDROcodone-acetaminophen (NORCO/VICODIN) 5-325 MG per tablet Take 1 tablet by mouth every 6 (six) hours as needed for pain.  65 tablet  1   No current facility-administered medications for this visit.    Review of Systems Review of  Systems  Constitutional: Negative for fever, activity change, appetite change and unexpected weight change.  HENT: Negative for nosebleeds and trouble swallowing.   Eyes: Negative for photophobia and visual disturbance.  Respiratory: Negative for chest tightness and shortness of breath.   Cardiovascular: Negative for chest pain and leg swelling.       Denies CP, SOB, orthopnea, PND, DOE  Gastrointestinal: Negative for abdominal pain.  Genitourinary: Negative for dysuria and difficulty urinating.  Musculoskeletal: Negative for arthralgias.  Skin: Negative for pallor and rash.  Neurological: Negative for dizziness, seizures, facial asymmetry and numbness.       Denies TIA and amaurosis fugax   Hematological: Negative for adenopathy. Does not bruise/bleed easily.  Psychiatric/Behavioral: Negative for behavioral problems and agitation.    Blood pressure 140/80, pulse 74, temperature 97.4 F (36.3 C), temperature source Temporal, resp. rate 16, height 5\' 5"  (1.651 m),  weight 212 lb 12.8 oz (96.525 kg), last menstrual period 05/29/1991.  Physical Exam Physical Exam  Vitals reviewed. Constitutional: She is oriented to person, place, and time. She appears well-developed and well-nourished. No distress.  obese  HENT:  Head: Normocephalic and atraumatic.  Right Ear: External ear normal.  Left Ear: External ear normal.  Eyes: Conjunctivae are normal. No scleral icterus.  Neck: Normal range of motion. Neck supple. No tracheal deviation present. No thyromegaly present.    1cm right posterior neck subcu mass at base of hairline. Mobile. Well circumscribed. Soft, NT. No redness  Cardiovascular: Normal rate and normal heart sounds.   Pulmonary/Chest: Effort normal and breath sounds normal. No stridor. No respiratory distress. She has no wheezes.  Abdominal: Soft. She exhibits no distension.  Musculoskeletal: She exhibits no edema and no tenderness.  Lymphadenopathy:    She has no cervical adenopathy.  Neurological: She is alert and oriented to person, place, and time. She exhibits normal muscle tone.  Skin: Skin is warm and dry. No rash noted. She is not diaphoretic. No erythema.  Psychiatric: She has a normal mood and affect. Her behavior is normal. Judgment and thought content normal.    Data Reviewed Dr Yetta Barre note  Assessment    Posterior neck epidermoid inclusion cyst     Plan    We discussed the etiology and management of sebaceous cysts (epidermoid inclusion cysts). The patient was given educational material. We discussed that these lesions can become infected at times. We discussed the signs and symptoms of infection. We discussed the only way to eliminate these lesions is to surgically excise the cyst and its wall in its entirety.   We discussed observation versus surgical excision in the office vs surgery center. We discussed the risks and benefits of surgery including but not limited to bleeding, infection, injury to surrounding structures,  scarring, cosmetic concerns, blood clot formation, anesthesia issues, possible recurrence, and the typical postoperative course.   The patient has elected to have the area excised in the office. She was asked to stop taking ASA and fish oil 5 days prior to procedure which has been scheduled on Wed Nov 23  Mary Sella. Andrey Campanile, MD, FACS General, Bariatric, & Minimally Invasive Surgery [facility], [place]          [facility] M 03/27/2013, 9:29 AM

## 2013-04-16 ENCOUNTER — Encounter (INDEPENDENT_AMBULATORY_CARE_PROVIDER_SITE_OTHER): Payer: Self-pay | Admitting: General Surgery

## 2013-04-16 ENCOUNTER — Ambulatory Visit (INDEPENDENT_AMBULATORY_CARE_PROVIDER_SITE_OTHER): Payer: BC Managed Care – PPO | Admitting: General Surgery

## 2013-04-16 VITALS — BP 172/101 | HR 55 | Temp 98.2°F | Resp 16 | Ht 65.0 in | Wt 216.4 lb

## 2013-04-16 DIAGNOSIS — L720 Epidermal cyst: Secondary | ICD-10-CM

## 2013-04-16 DIAGNOSIS — L723 Sebaceous cyst: Secondary | ICD-10-CM

## 2013-04-16 NOTE — Progress Notes (Signed)
Subjective:     Patient ID: Emily Wheeler, female   DOB: Jan 09, 1956, 57 y.o.   MRN: [id number]  HPI 57 year old African American female comes back in to the office today for excision of a right posterior Epidermoid inclusion cyst. She denies any changes since she was last seen.  PMHx, PSHx, SOCHx, FAMHx, ALL reviewed and unchanged  An 8 point review of systems was performed and all systems are negative  Medications reviewed  Review of Systems     Objective:   Physical Exam  Constitutional: She is oriented to person, place, and time. She appears well-developed and well-nourished. No distress.  obese  HENT:  Head: Normocephalic and atraumatic.  Right Ear: External ear normal.  Left Ear: External ear normal.  Eyes: Conjunctivae are normal.  Neck: Normal range of motion. Neck supple. No tracheal deviation present.    1-1/2 cm soft tissue mass at the base of her right posterior neck line. Soft, nontender, well-circumscribed, no cellulitis  Pulmonary/Chest: Effort normal. No respiratory distress.  Abdominal: She exhibits no distension.  Lymphadenopathy:    She has no cervical adenopathy.  Neurological: She is alert and oriented to person, place, and time.  Skin: Skin is warm and dry. No rash noted. She is not diaphoretic. No erythema.  Psychiatric: She has a normal mood and affect. Her behavior is normal. Judgment and thought content normal.       Assessment:     Right posterior neck epidermoid inclusion     Plan:     We reviewed the risk and benefits of the procedure and she elected to proceed with the procedure.  Preprocedure diagnosis: Right posterior neck epidermoid inclusion cyst  Post procedure diagnosis: Same Procedure: Excision of right posterior neck epidermoid inclusion cyst (1.5 cm)  Surgeon: Mary Sella. Nekeshia Lenhardt M.D. Anesthesia: 4 cc of 1% Xylocaine with epinephrine mixed with sodium bicarbonate  Procedure: After obtaining informed consent, the patient's right  posterior neck was prepped with ChloraPrep. Local was infiltrated directly over the soft tissue mass. A 1-1/2 cm horizontal incision was made sharply with a #15 blade. The deep dermis was incised with the scalpel further. The cyst wall was entered and there was extrusion of sebaceous cyst material. Using a 15 blade I sharply dissected out the entire cyst including its sac. The cyst was completely excised. The wound was irrigated with Betadine. There was some additional local infiltrated. A 3-0 Vicryl was used to close the deep dermis. The skin was closed with a simple interrupted 4-0 Monocryl suture in a subcuticular fashion. Benzoin, Steri-Strips, 2 x 2 and a Tegaderm were applied. The patient tolerated the procedure well. There were no immediate complications. The patient was given wound care instructions. The specimen was discarded. Followup in 4 weeks for a wound check  Mary Sella. Andrey Campanile, MD, FACS General, Bariatric, & Minimally Invasive Surgery [facility], [place]

## 2013-04-16 NOTE — Patient Instructions (Signed)
Keep area dry until Saturday On Saturday, can remove clear see thru bandage White strips on skin will fall off over next 2 weeks  Call for Temperature >101, signs of wound infection (bleeding, redness, worsening swelling, foul smelling drainage), or any questions  [phone]

## 2013-04-20 ENCOUNTER — Ambulatory Visit
Admission: RE | Admit: 2013-04-20 | Discharge: 2013-04-20 | Disposition: A | Discharge status: Home / Self Care | Payer: BC Managed Care – PPO | Source: Ambulatory Visit | Attending: Internal Medicine | Admitting: Internal Medicine

## 2013-04-20 ENCOUNTER — Other Ambulatory Visit: Payer: Self-pay | Admitting: Internal Medicine

## 2013-04-20 DIAGNOSIS — Z87898 Personal history of other specified conditions: Secondary | ICD-10-CM

## 2013-05-14 ENCOUNTER — Encounter (INDEPENDENT_AMBULATORY_CARE_PROVIDER_SITE_OTHER): Payer: BC Managed Care – PPO | Admitting: General Surgery

## 2013-07-03 ENCOUNTER — Telehealth: Payer: Self-pay | Admitting: Internal Medicine

## 2013-07-03 MED ORDER — METFORMIN HCL 500 MG PO TABS: 500.0000 mg | ORAL_TABLET | Freq: Two times a day (BID) | ORAL | Status: DC

## 2013-07-03 MED ORDER — CITALOPRAM HYDROBROMIDE 20 MG PO TABS: 20.0000 mg | ORAL_TABLET | Freq: Every day | ORAL | Status: DC

## 2013-07-03 NOTE — Telephone Encounter (Signed)
Patient is calling to request refill on her Citalopram and Metformin rx's. These were originally rx'd by her PCP while living in [place]. She would like for these to be sent to [facility] on [address]. Please advise.

## 2013-07-03 NOTE — Telephone Encounter (Signed)
Rx sent per pt request 

## 2013-07-10 ENCOUNTER — Other Ambulatory Visit: Payer: Self-pay

## 2013-07-10 MED ORDER — CITALOPRAM HYDROBROMIDE 20 MG PO TABS: 20.0000 mg | ORAL_TABLET | Freq: Every day | ORAL | Status: DC

## 2013-07-10 MED ORDER — METFORMIN HCL 500 MG PO TABS: 500.0000 mg | ORAL_TABLET | Freq: Two times a day (BID) | ORAL | Status: DC

## 2013-07-29 ENCOUNTER — Encounter: Payer: Self-pay | Admitting: Internal Medicine

## 2013-07-29 ENCOUNTER — Ambulatory Visit (INDEPENDENT_AMBULATORY_CARE_PROVIDER_SITE_OTHER): Payer: BC Managed Care – PPO | Admitting: Internal Medicine

## 2013-07-29 ENCOUNTER — Other Ambulatory Visit (INDEPENDENT_AMBULATORY_CARE_PROVIDER_SITE_OTHER): Payer: BC Managed Care – PPO

## 2013-07-29 VITALS — BP 128/70 | HR 55 | Temp 98.6°F | Resp 16 | Ht 65.0 in | Wt 219.0 lb

## 2013-07-29 DIAGNOSIS — J069 Acute upper respiratory infection, unspecified: Secondary | ICD-10-CM

## 2013-07-29 DIAGNOSIS — E785 Hyperlipidemia, unspecified: Secondary | ICD-10-CM

## 2013-07-29 DIAGNOSIS — I10 Essential (primary) hypertension: Secondary | ICD-10-CM

## 2013-07-29 DIAGNOSIS — E876 Hypokalemia: Secondary | ICD-10-CM

## 2013-07-29 DIAGNOSIS — E1165 Type 2 diabetes mellitus with hyperglycemia: Principal | ICD-10-CM

## 2013-07-29 DIAGNOSIS — IMO0001 Reserved for inherently not codable concepts without codable children: Secondary | ICD-10-CM

## 2013-07-29 LAB — HEMOGLOBIN A1C: Hgb A1c MFr Bld: 6.5 % (ref 4.6–6.5)

## 2013-07-29 LAB — BASIC METABOLIC PANEL
BUN: 15 mg/dL (ref 6–23)
CALCIUM: 9.3 mg/dL (ref 8.4–10.5)
CHLORIDE: 105 meq/L (ref 96–112)
CO2: 27 meq/L (ref 19–32)
CREATININE: 0.9 mg/dL (ref 0.4–1.2)
GFR: 78.27 mL/min (ref >60.00)
GLUCOSE: 116 mg/dL — AB (ref 70–99)
Potassium: 4.1 mEq/L (ref 3.5–5.1)
Sodium: 141 mEq/L (ref 135–145)

## 2013-07-29 LAB — CK: Total CK: 202 U/L — ABNORMAL HIGH (ref 7–177)

## 2013-07-29 MED ORDER — ROSUVASTATIN CALCIUM 20 MG PO TABS: 20.0000 mg | ORAL_TABLET | Freq: Every day | ORAL | Status: DC

## 2013-07-29 MED ORDER — PROMETHAZINE-DM 6.25-15 MG/5ML PO SYRP: 5.0000 mL | ORAL_SOLUTION | Freq: Four times a day (QID) | ORAL | Status: DC | PRN

## 2013-07-29 NOTE — Assessment & Plan Note (Signed)
The A1C shows that her blood sugar is well controlled 

## 2013-07-29 NOTE — Patient Instructions (Signed)
Type 2 Diabetes Mellitus, Adult Type 2 diabetes mellitus, often simply referred to as type 2 diabetes, is a long-lasting (chronic) disease. In type 2 diabetes, the pancreas does not make enough insulin (a hormone), the cells are less responsive to the insulin that is made (insulin resistance), or both. Normally, insulin moves sugars from food into the tissue cells. The tissue cells use the sugars for energy. The lack of insulin or the lack of normal response to insulin causes excess sugars to build up in the blood instead of going into the tissue cells. As a result, high blood sugar (hyperglycemia) develops. The effect of high sugar (glucose) levels can cause many complications. Type 2 diabetes was also previously called adult-onset diabetes but it can occur at any age.  RISK FACTORS  A person is predisposed to developing type 2 diabetes if someone in the family has the disease and also has one or more of the following primary risk factors:  Overweight.  An inactive lifestyle.  A history of consistently eating high-calorie foods. Maintaining a normal weight and regular physical activity can reduce the chance of developing type 2 diabetes. SYMPTOMS  A person with type 2 diabetes may not show symptoms initially. The symptoms of type 2 diabetes appear slowly. The symptoms include:  Increased thirst (polydipsia).  Increased urination (polyuria).  Increased urination during the night (nocturia).  Weight loss. This weight loss may be rapid.  Frequent, recurring infections.  Tiredness (fatigue).  Weakness.  Vision changes, such as blurred vision.  Fruity smell to your breath.  Abdominal pain.  Nausea or vomiting.  Cuts or bruises which are slow to heal.  Tingling or numbness in the hands or feet. DIAGNOSIS Type 2 diabetes is frequently not diagnosed until complications of diabetes are present. Type 2 diabetes is diagnosed when symptoms or complications are present and when blood  glucose levels are increased. Your blood glucose level may be checked by one or more of the following blood tests:  A fasting blood glucose test. You will not be allowed to eat for at least 8 hours before a blood sample is taken.  A random blood glucose test. Your blood glucose is checked at any time of the day regardless of when you ate.  A hemoglobin A1c blood glucose test. A hemoglobin A1c test provides information about blood glucose control over the previous 3 months.  An oral glucose tolerance test (OGTT). Your blood glucose is measured after you have not eaten (fasted) for 2 hours and then after you drink a glucose-containing beverage. TREATMENT   You may need to take insulin or diabetes medicine daily to keep blood glucose levels in the desired range.  You will need to match insulin dosing with exercise and healthy food choices. The treatment goal is to maintain the before meal blood sugar (preprandial glucose) level at 70 130 mg/dL. HOME CARE INSTRUCTIONS   Have your hemoglobin A1c level checked twice a year.  Perform daily blood glucose monitoring as directed by your caregiver.  Monitor urine ketones when you are ill and as directed by your caregiver.  Take your diabetes medicine or insulin as directed by your caregiver to maintain your blood glucose levels in the desired range.  Never run out of diabetes medicine or insulin. It is needed every day.  Adjust insulin based on your intake of carbohydrates. Carbohydrates can raise blood glucose levels but need to be included in your diet. Carbohydrates provide vitamins, minerals, and fiber which are an essential part of   a healthy diet. Carbohydrates are found in fruits, vegetables, whole grains, dairy products, legumes, and foods containing added sugars.    Eat healthy foods. Alternate 3 meals with 3 snacks.  Lose weight if overweight.  Carry a medical alert card or wear your medical alert jewelry.  Carry a 15 gram  carbohydrate snack with you at all times to treat low blood glucose (hypoglycemia). Some examples of 15 gram carbohydrate snacks include:  Glucose tablets, 3 or 4   Glucose gel, 15 gram tube  Raisins, 2 tablespoons (24 grams)  Jelly beans, 6  Animal crackers, 8  Regular pop, 4 ounces (120 mL)  Gummy treats, 9  Recognize hypoglycemia. Hypoglycemia occurs with blood glucose levels of 70 mg/dL and below. The risk for hypoglycemia increases when fasting or skipping meals, during or after intense exercise, and during sleep. Hypoglycemia symptoms can include:  Tremors or shakes.  Decreased ability to concentrate.  Sweating.  Increased heart rate.  Headache.  Dry mouth.  Hunger.  Irritability.  Anxiety.  Restless sleep.  Altered speech or coordination.  Confusion.  Treat hypoglycemia promptly. If you are alert and able to safely swallow, follow the 15:15 rule:  Take 15 20 grams of rapid-acting glucose or carbohydrate. Rapid-acting options include glucose gel, glucose tablets, or 4 ounces (120 mL) of fruit juice, regular soda, or low fat milk.  Check your blood glucose level 15 minutes after taking the glucose.  Take 15 20 grams more of glucose if the repeat blood glucose level is still 70 mg/dL or below.  Eat a meal or snack within 1 hour once blood glucose levels return to normal.    Be alert to polyuria and polydipsia which are early signs of hyperglycemia. An early awareness of hyperglycemia allows for prompt treatment. Treat hyperglycemia as directed by your caregiver.  Engage in at least 150 minutes of moderate-intensity physical activity a week, spread over at least 3 days of the week or as directed by your caregiver. In addition, you should engage in resistance exercise at least 2 times a week or as directed by your caregiver.  Adjust your medicine and food intake as needed if you start a new exercise or sport.  Follow your sick day plan at any time you  are unable to eat or drink as usual.  Avoid tobacco use.  Limit alcohol intake to no more than 1 drink per day for nonpregnant women and 2 drinks per day for men. You should drink alcohol only when you are also eating food. Talk with your caregiver whether alcohol is safe for you. Tell your caregiver if you drink alcohol several times a week.  Follow up with your caregiver regularly.  Schedule an eye exam soon after the diagnosis of type 2 diabetes and then annually.  Perform daily skin and foot care. Examine your skin and feet daily for cuts, bruises, redness, nail problems, bleeding, blisters, or sores. A foot exam by a caregiver should be done annually.  Brush your teeth and gums at least twice a day and floss at least once a day. Follow up with your dentist regularly.  Share your diabetes management plan with your workplace or school.  Stay up-to-date with immunizations.  Learn to manage stress.  Obtain ongoing diabetes education and support as needed.  Participate in, or seek rehabilitation as needed to maintain or improve independence and quality of life. Request a physical or occupational therapy referral if you are having foot or hand numbness or difficulties with grooming,   dressing, eating, or physical activity. SEEK MEDICAL CARE IF:   You are unable to eat food or drink fluids for more than 6 hours.  You have nausea and vomiting for more than 6 hours.  Your blood glucose level is over 240 mg/dL.  There is a change in mental status.  You develop an additional serious illness.  You have diarrhea for more than 6 hours.  You have been sick or have had a fever for a couple of days and are not getting better.  You have pain during any physical activity.  SEEK IMMEDIATE MEDICAL CARE IF:  You have difficulty breathing.  You have moderate to large ketone levels. MAKE SURE YOU:  Understand these instructions.  Will watch your condition.  Will get help right away if  you are not doing well or get worse. Document Released: 05/14/2005 Document Revised: 02/06/2012 Document Reviewed: 12/11/2011 [employer] Patient Information 2014 [employer].  

## 2013-07-29 NOTE — Assessment & Plan Note (Signed)
Her BP is well controlled 

## 2013-07-29 NOTE — Assessment & Plan Note (Signed)
This is viral so antibiotics are not indicated She will try phenergan-dm for the cough

## 2013-07-29 NOTE — Assessment & Plan Note (Signed)
I have asked her to start K+ replacement for this

## 2013-07-29 NOTE — Progress Notes (Signed)
Subjective:    Patient ID: Emily Wheeler, female    DOB: May 16, 1956, 58 y.o.   MRN: [id number]  URI  The current episode started in the past 7 days. The problem has been gradually improving. There has been no fever. Associated symptoms include congestion, coughing (mild NP), rhinorrhea, sneezing and a sore throat. Pertinent negatives include no abdominal pain, chest pain, diarrhea, dysuria, ear pain, headaches, joint pain, joint swelling, nausea, neck pain, plugged ear sensation, rash, sinus pain, swollen glands, vomiting or wheezing. She has tried nothing for the symptoms. The treatment provided mild relief.      Review of Systems  Constitutional: Negative.  Negative for fever, chills, diaphoresis, appetite change and fatigue.  HENT: Positive for congestion, postnasal drip, rhinorrhea, sneezing and sore throat. Negative for ear pain, facial swelling, sinus pressure, tinnitus, trouble swallowing and voice change.   Eyes: Negative.   Respiratory: Positive for cough (mild NP). Negative for wheezing.   Cardiovascular: Negative.  Negative for chest pain, palpitations and leg swelling.  Gastrointestinal: Negative.  Negative for nausea, vomiting, abdominal pain, diarrhea, constipation and blood in stool.  Endocrine: Negative.   Genitourinary: Negative.  Negative for dysuria.  Musculoskeletal: Positive for arthralgias and back pain. Negative for gait problem, joint pain, joint swelling, myalgias, neck pain and neck stiffness.  Skin: Negative.  Negative for rash.  Allergic/Immunologic: Negative.   Neurological: Negative.  Negative for headaches.  Hematological: Negative.  Negative for adenopathy. Does not bruise/bleed easily.  Psychiatric/Behavioral: Negative.        Objective:   Physical Exam  Vitals reviewed. Constitutional: She is oriented to person, place, and time. She appears well-developed and well-nourished.  Non-toxic appearance. She does not have a sickly appearance. She does not  appear ill. No distress.  HENT:  Head: Normocephalic and atraumatic.  Right Ear: Hearing, tympanic membrane, external ear and ear canal normal.  Left Ear: Hearing, tympanic membrane, external ear and ear canal normal.  Nose: No mucosal edema or rhinorrhea. Right sinus exhibits no maxillary sinus tenderness and no frontal sinus tenderness. Left sinus exhibits no maxillary sinus tenderness and no frontal sinus tenderness.  Mouth/Throat: Oropharynx is clear and moist and mucous membranes are normal. Mucous membranes are not pale, not dry and not cyanotic. No oral lesions. No trismus in the jaw. No uvula swelling. No oropharyngeal exudate, posterior oropharyngeal edema, posterior oropharyngeal erythema or tonsillar abscesses.  Eyes: Conjunctivae are normal. Right eye exhibits no discharge. Left eye exhibits no discharge. No scleral icterus.  Neck: Normal range of motion. Neck supple. No JVD present. No tracheal deviation present. No thyromegaly present.  Cardiovascular: Normal rate, regular rhythm, normal heart sounds and intact distal pulses.  Exam reveals no gallop and no friction rub.   No murmur heard. Pulmonary/Chest: Effort normal and breath sounds normal. No stridor. No respiratory distress. She has no wheezes. She has no rales. She exhibits no tenderness.  Abdominal: Soft. Bowel sounds are normal. She exhibits no distension and no mass. There is no tenderness. There is no rebound and no guarding.  Musculoskeletal: Normal range of motion. She exhibits no edema and no tenderness.  Lymphadenopathy:    She has no cervical adenopathy.  Neurological: She is oriented to person, place, and time.  Skin: Skin is warm and dry. No rash noted. She is not diaphoretic. No erythema. No pallor.      Lab Results  Component Value Date   WBC 5.9 01/30/2013   HGB 12.0 01/30/2013   HCT 35.7* 01/30/2013  PLT 260.0 01/30/2013   GLUCOSE 109* 01/30/2013   CHOL 245* 01/30/2013   TRIG 197.0* 01/30/2013   HDL 47.60  01/30/2013   LDLDIRECT 162.5 01/30/2013   ALT 23 01/30/2013   AST 22 01/30/2013   NA 140 01/30/2013   K 3.1* 01/30/2013   CL 103 01/30/2013   CREATININE 0.9 01/30/2013   BUN 14 01/30/2013   CO2 30 01/30/2013   TSH 1.69 01/30/2013   HGBA1C 6.8* 01/30/2013   MICROALBUR 3.0* 01/30/2013      Assessment & Plan:

## 2013-07-29 NOTE — Assessment & Plan Note (Signed)
Will stop lovastatin due to a lack of efficacy I have asked her to start crestor

## 2013-08-03 ENCOUNTER — Telehealth: Payer: Self-pay | Admitting: *Deleted

## 2013-08-03 MED ORDER — LISINOPRIL 20 MG PO TABS: 20.0000 mg | ORAL_TABLET | Freq: Every day | ORAL | Status: DC

## 2013-08-03 NOTE — Telephone Encounter (Signed)
Patient phoned requesting refill for lisinopril.  Refilled per protocol and notified patient via personal voicemail message.

## 2013-08-12 ENCOUNTER — Telehealth: Payer: Self-pay

## 2013-08-12 DIAGNOSIS — M5416 Radiculopathy, lumbar region: Secondary | ICD-10-CM

## 2013-08-12 NOTE — Telephone Encounter (Signed)
The patient called and is hoping to get a refill on her Norco rx.    Callback - [phone]

## 2013-08-13 MED ORDER — HYDROCODONE-ACETAMINOPHEN 5-325 MG PO TABS: 1.0000 | ORAL_TABLET | Freq: Four times a day (QID) | ORAL | Status: DC | PRN

## 2013-08-13 NOTE — Telephone Encounter (Signed)
Med refilled, pt will pick up rx from office

## 2013-08-13 NOTE — Telephone Encounter (Signed)
This pt would like a refill on her norco

## 2013-08-13 NOTE — Telephone Encounter (Signed)
done

## 2013-09-02 ENCOUNTER — Telehealth: Payer: Self-pay | Admitting: *Deleted

## 2013-09-02 DIAGNOSIS — M069 Rheumatoid arthritis, unspecified: Secondary | ICD-10-CM

## 2013-09-02 MED ORDER — ALBUTEROL SULFATE HFA 108 (90 BASE) MCG/ACT IN AERS: 1.0000 | INHALATION_SPRAY | Freq: Four times a day (QID) | RESPIRATORY_TRACT | Status: DC | PRN

## 2013-09-02 MED ORDER — HYDROCHLOROTHIAZIDE 25 MG PO TABS: 25.0000 mg | ORAL_TABLET | Freq: Every day | ORAL | Status: DC

## 2013-09-02 MED ORDER — LEFLUNOMIDE 20 MG PO TABS: 20.0000 mg | ORAL_TABLET | Freq: Every day | ORAL | Status: DC

## 2013-09-02 NOTE — Telephone Encounter (Signed)
done

## 2013-09-02 NOTE — Telephone Encounter (Signed)
Pt called states she uses Albuterol, however it is not listed on med list.  Please advise refill.

## 2013-09-02 NOTE — Telephone Encounter (Signed)
Spoke with pt advised rx sent. 

## 2013-09-09 ENCOUNTER — Other Ambulatory Visit: Payer: Self-pay | Admitting: Neurosurgery

## 2013-09-14 ENCOUNTER — Telehealth: Payer: Self-pay | Admitting: *Deleted

## 2013-09-14 ENCOUNTER — Other Ambulatory Visit: Payer: Self-pay | Admitting: Internal Medicine

## 2013-09-14 DIAGNOSIS — Z87898 Personal history of other specified conditions: Secondary | ICD-10-CM

## 2013-09-14 NOTE — Telephone Encounter (Signed)
done

## 2013-09-14 NOTE — Telephone Encounter (Signed)
Spoke with pt advised referral sent 

## 2013-09-14 NOTE — Telephone Encounter (Signed)
[facility].  Please advise

## 2013-09-15 ENCOUNTER — Other Ambulatory Visit: Payer: Self-pay | Admitting: Internal Medicine

## 2013-09-15 DIAGNOSIS — N63 Unspecified lump in unspecified breast: Secondary | ICD-10-CM

## 2013-09-22 ENCOUNTER — Other Ambulatory Visit: Payer: Self-pay

## 2013-09-22 ENCOUNTER — Other Ambulatory Visit: Payer: Self-pay | Admitting: Internal Medicine

## 2013-09-22 DIAGNOSIS — N63 Unspecified lump in unspecified breast: Secondary | ICD-10-CM

## 2013-09-24 ENCOUNTER — Encounter (HOSPITAL_COMMUNITY): Payer: Self-pay | Admitting: Pharmacy Technician

## 2013-09-28 ENCOUNTER — Ambulatory Visit
Admission: RE | Admit: 2013-09-28 | Discharge: 2013-09-28 | Disposition: A | Discharge status: Home / Self Care | Payer: BC Managed Care – PPO | Source: Ambulatory Visit | Attending: Internal Medicine | Admitting: Internal Medicine

## 2013-09-28 DIAGNOSIS — N63 Unspecified lump in unspecified breast: Secondary | ICD-10-CM

## 2013-09-28 LAB — HM MAMMOGRAPHY: HM MAMMO: ABNORMAL

## 2013-09-30 ENCOUNTER — Encounter (HOSPITAL_COMMUNITY): Payer: Self-pay

## 2013-09-30 ENCOUNTER — Encounter (HOSPITAL_COMMUNITY)
Admission: RE | Admit: 2013-09-30 | Discharge: 2013-09-30 | Disposition: A | Discharge status: Home / Self Care | Payer: BC Managed Care – PPO | Source: Ambulatory Visit | Attending: Neurosurgery | Admitting: Neurosurgery

## 2013-09-30 DIAGNOSIS — Z01810 Encounter for preprocedural cardiovascular examination: Secondary | ICD-10-CM | POA: Insufficient documentation

## 2013-09-30 DIAGNOSIS — Z01818 Encounter for other preprocedural examination: Secondary | ICD-10-CM | POA: Insufficient documentation

## 2013-09-30 DIAGNOSIS — Z01812 Encounter for preprocedural laboratory examination: Secondary | ICD-10-CM | POA: Insufficient documentation

## 2013-09-30 HISTORY — DX: Unspecified osteoarthritis, unspecified site: M19.90

## 2013-09-30 HISTORY — DX: Anesthesia of skin: R20.2

## 2013-09-30 HISTORY — DX: Anxiety disorder, unspecified: F41.9

## 2013-09-30 HISTORY — DX: Anemia, unspecified: D64.9

## 2013-09-30 HISTORY — DX: Dizziness and giddiness: R42

## 2013-09-30 HISTORY — DX: Residual hemorrhoidal skin tags: K64.4

## 2013-09-30 HISTORY — DX: Rheumatoid arthritis, unspecified: M06.9

## 2013-09-30 HISTORY — DX: Frequency of micturition: R35.0

## 2013-09-30 HISTORY — DX: Anesthesia of skin: R20.0

## 2013-09-30 LAB — CBC
HEMATOCRIT: 34.6 % — AB (ref 36.0–46.0)
Hemoglobin: 11.5 g/dL — ABNORMAL LOW (ref 12.0–15.0)
MCH: 30.7 pg (ref 26.0–34.0)
MCHC: 33.2 g/dL (ref 30.0–36.0)
MCV: 92.3 fL (ref 78.0–100.0)
Platelets: 237 10*3/uL (ref 150–400)
RBC: 3.75 MIL/uL — ABNORMAL LOW (ref 3.87–5.11)
RDW: 13.9 % (ref 11.5–15.5)
WBC: 6 10*3/uL (ref 4.0–10.5)

## 2013-09-30 LAB — BASIC METABOLIC PANEL
BUN: 14 mg/dL (ref 6–23)
CALCIUM: 8.9 mg/dL (ref 8.4–10.5)
CHLORIDE: 105 meq/L (ref 96–112)
CO2: 25 meq/L (ref 19–32)
CREATININE: 0.83 mg/dL (ref 0.50–1.10)
GFR calc Af Amer: 88 mL/min — ABNORMAL LOW (ref >90)
GFR calc non Af Amer: 76 mL/min — ABNORMAL LOW (ref >90)
Glucose, Bld: 161 mg/dL — ABNORMAL HIGH (ref 70–99)
Potassium: 4 mEq/L (ref 3.7–5.3)
Sodium: 144 mEq/L (ref 137–147)

## 2013-09-30 LAB — SURGICAL PCR SCREEN
MRSA, PCR: NEGATIVE
Staphylococcus aureus: NEGATIVE

## 2013-09-30 LAB — TYPE AND SCREEN
ABO/RH(D): O POS
Antibody Screen: NEGATIVE

## 2013-09-30 LAB — ABO/RH: ABO/RH(D): O POS

## 2013-09-30 NOTE — Progress Notes (Signed)
Patient denied having a stress test, cardiac cath, or sleep study. PCP is Sanda Linger.

## 2013-09-30 NOTE — Pre-Procedure Instructions (Signed)
Emily Wheeler  09/30/2013   Your procedure is scheduled on:  Thursday Oct 08, 2013 at 7:30 AM.  Report to [facility] Short Stay Entrance "A"  Admitting at 5:30 AM.  Call this number if you have problems the morning of surgery: [phone]   Remember:   Do not eat food or drink liquids after midnight.   Take these medicines the morning of surgery with A SIP OF WATER: Albuterol inhaler if needed, Atenolol (Tenormin), Citalopram (Celexa), Gabapentin (Neurontin), Hydrocodone if needed for pain, Nifedipine (Procardia)    Discontinue aspirin, and herbal medications 7 days before surgery (Ex. Fish oil, Cinnamon)   Do not take any diabetic medications the morning of your surgery   Do not wear jewelry, make-up or nail polish.  Do not wear lotions, powders, or perfumes.   Do not shave 48 hours prior to surgery.   Do not bring valuables to the hospital.  [facility] is not responsible for any belongings or valuables.               Contacts, dentures or bridgework may not be worn into surgery.  Leave suitcase in the car. After surgery it may be brought to your room.  For patients admitted to the hospital, discharge time is determined by your treatment team.               Patients discharged the day of surgery will not be allowed to drive home.  Name and phone number of your driver: Family/Friend  Special Instructions: Shower the night before and the morning of your surgery using CHG soap   Please read over the following fact sheets that you were given: Pain Booklet, Coughing and Deep Breathing, Blood Transfusion Information, MRSA Information and Surgical Site Infection Prevention

## 2013-10-01 ENCOUNTER — Encounter (HOSPITAL_COMMUNITY): Payer: Self-pay

## 2013-10-01 NOTE — Progress Notes (Signed)
Anesthesia Chart Review:  Patient is a 58 year old female scheduled for L3-4, L4-5, L5-S1 MAS, PLIF on 10/08/13 by Dr. Venetia Maxon.  History includes former smoker, DM2, asthma, HLD, HTN, anxiety, arthritis, anemia, RA, hysterectomy, marijuana use (reports none in "months").  BMI is 38 consistent with obesity.  PCP is Dr. Sanda Linger. Rheumatologist is Dr. Dareen Piano.  Remicade was held for surgery. [place] two [place].  EKG on 09/30/13 showed: SB @ 55 bpm, LAD, incomplete right BBB, minimal voltage criteria for LVH, may be normal variant, septal infarct (age undetermined), non-specific ST/T wave abnormality, in inferolateral leads.  There are no currently available comparison EKGs available in Epic or Muse. She denied prior stress, echo, or cath. She denies CP, SOB.  She get mild occasional mild edema in her LLE (the one affected by her disc disease) and also in other joints affected by RA.  She is not particularly active as she has to walk with a cane.   CXR on 09/30/13 showed: No acute infiltrate or pulmonary edema. Mild degenerative changes thoracic spine.  Preoperative labs noted.  Glucose 161. A1C was 6.5 on 07/29/13.   History and EKG reviewed with anesthesiologist Dr. Jean Rosenthal.  If patient remains asymptomatic from a CV standpoint then it is anticipated that she could proceed as planned.  Velna Ochs [facility] Short Stay Center/Anesthesiology Phone [phone] 10/02/2013 3:14 PM

## 2013-10-07 MED ORDER — CEFAZOLIN SODIUM-DEXTROSE 2-3 GM-% IV SOLR
2.0000 g | INTRAVENOUS | Status: AC
  Administered 2013-10-08 (×2): 2 g via INTRAVENOUS
  Filled 2013-10-07: qty 50

## 2013-10-07 NOTE — H&P (Signed)
> [address] [place], [place] [address] Phone: [phone]   Patient ID:   [id number] Patient: Emily Wheeler  Date of Birth: July 03, 1955 Visit Type: Office Visit   Date: 09/07/2013 03:00 PM Provider: Danae Orleans. Venetia Maxon MD   This 58 year old female presents for Follow Up of back pain.  History of Present Illness: 1.  Follow Up of back pain  Emily Wheeler visits reporting lumbar and left hip/leg pain x39yrs.  She saw Dr. Conchita Paris once, transferring at her sister's urging.  Norco BID-TID helps some.   MRI, X-ray on [place]  Patient comes into the office today with her sister.  I performed a lumbar fusion surgery on her sister and she did extremely well.  She encouraged her to see me for further care.  The patient has rheumatoid arthritis.  She describes 10 out of 10 pain.  She has lost weight and went from 265 pounds to 222 pounds but has a BMI of 37.  I reviewed her goal weight and with a height of 5 feet 5 inches she should be proximally 160 pounds.  The patient is currently describing left greater than right lower extremity pain.  She says she is falling and has weakness in the legs and can barely walk.  She says she uses a cane.  She did not get relief with conservative management including injections and therapy.  She says this is been going on for greater than 2 years and is steadily worsening and is now taking Norco up to 4 times daily.  Patient has bilaterally positive straight leg raise left greater than right.  She has sciatic notch discomfort and is limited in terms of walking due to back and leg pain.        PAST MEDICAL/SURGICAL HISTORY   (Detailed)  Disease/disorder Onset Date Management Date Comments    Hysterectomy, total 1985     Carpal tunnel release 1980     Tubal Ligation 1977   Arthritis      Diabetes type 2      High cholesterol      Hypertension       DIAGNOSTICS HISTORY: Test Ordered Interpretation Result completed  ESI - L4-L5  (2nd of 2 planned) 03/05/2013   03/05/2013   Test Ordered Ordering Comments Modifier  ESI - L4-L5 (2nd of 2 planned) 03/05/2013       PAST MEDICAL HISTORY, SURGICAL HISTORY, FAMILY HISTORY, SOCIAL HISTORY AND REVIEW OF SYSTEMS I have reviewed the patient's past medical, surgical, family and social history as well as the comprehensive review of systems as included on the [facility] history form dated 03/05/2013, which I have signed.  Family History  (Detailed)  Relationship Family Member Name Deceased Age at Death Condition Onset Age Cause of Death      Family history of Cancer, colon  N      Family history of Diabetes mellitus  N      Family history of Myocardial infarction  N   SOCIAL HISTORY  (Detailed) Tobacco use reviewed. Preferred language is Unknown.   Smoking status: Former smoker.  SMOKING STATUS Use Status Type Smoking Status Usage Per Day Years Used Total Pack Years  yes Cigarette Former smoker     yes  Former smoker      CESSATION Type Date Quit Longest Tobacco Free Cessation Method  Cigarette 05/28/1992            MEDICATIONS(added, continued or stopped this visit):  Started Medication Directions Instruction Stopped   atenolol 100 mg tablet take 1 tablet by oral route  every day     citalopram 20 mg tablet take 1 tablet by oral route  every day     gabapentin 600 mg tablet take 1 tablet by oral route 2 times every day     hydrochlorothiazide 25 mg tablet take 1 tablet by oral route  every day     Klor-Con 20  ORAL      leflunomide 20 mg tablet take 1 tablet by oral route  every day     lisinopril 20 mg tablet take 1 tablet by oral route  every day     metformin 500 mg tablet take 1 tablet by oral route 2 times every day with morning and evening meals     Norco 5 mg-325 mg tablet take 1 tablet by oral route  every 6 hours as needed for pain     ProAir HFA 90 mcg/actuation aerosol inhaler inhale 2 puff by inhalation route   every 4 - 6 hours as needed     Procardia XL 30 mg tablet,extended release take 1 tablet by oral route  every day      ALLERGIES:  Ingredient Reaction Medication Name Comment  NO KNOWN ALLERGIES     No known allergies. Reviewed, no changes.   Vitals Date Temp F BP Pulse Ht In Wt Lb BMI BSA Pain Score  09/07/2013  162/96 58 64 221 37.93  9/10      DIAGNOSTIC RESULTS I reviewed and lumbar MRI and also plain lumbar radiographs.  Lumbar radiographs demonstrate spondylolisthesis of L3 on L4 and at L4-L5 with retrolisthesis of L5 on S1.  At L3 L4 neutral lateral radiograph there is 9.6 mm of listhesis which increases to 10.5 mm on flexion and decreases to 8.5 mm on extension.  At the L4 L5 level there is 8 mm of anterolisthesis on neutral lateral radiograph which increases to 9 mm on flexion and decreases to 6.5 mm on extension.  At the L5-S1 level there is 7 mm of retrolisthesis on extension and 5 mm on neutral lateral radiograph and flexion.  There is severe degeneration at the 3 lowest disc levels in her lumbar spine along with associated lumbar spinal stenosis.    IMPRESSION Emily Wheeler explained to the patient that she has 3 level lumbar disc disease and is obese and that she needs to lose weight.  She says she is not able to tolerate her current level of discomfort and wants to get some relief.  She has not improved with conservative management including physical therapy and injections.  I therefore recommended proceeding with decompression and fusion at the L3 L4, L4 L5, L5-S1 levels.  She wishes to do so and we went over the details of the surgery today.  Completed Orders (this encounter) Order Details Reason Side Interpretation Result Initial Treatment Date Region  Lifestyle education regarding diet Encouraged to eat a well balanced diet and follow up with primary care physician.        Hypertension education Follow up with primary care physician.        Lumbar Spine-  AP/Lat/Obls/Spot/Flex/Ex      09/07/2013 All Levels to All Levels   Assessment/Plan # Detail Type Description   1. Assessment BMI 37.0-37.9,ADULT (V85.37).   Plan Orders Today's instructions / counseling include(s) Lifestyle education regarding diet.       2. Assessment Hypertension, Unspecified (401.9).  3. Assessment Lumbar stenosis with neurogenic claudication (724.03).       4. Assessment Lumbosacral spondylosis without myelopathy (721.3).       5. Assessment Acquired spondylolisthesis (738.4).       6. Assessment Lumbago (724.2).       7. Assessment Lumbar radiculopathy (724.4).         Pain Assessment/Treatment Pain Scale: 9/10. Method: Numeric Pain Intensity Scale. Location: back. Onset: 09/08/2010. Duration: varies. Quality: stabbing. Pain Assessment/Treatment follow-up plan of care: Patient currently taking pain medication daily..  Plan his decompression and fusion L3 L4 L4 L5 and L5-S1 levels on 10/08/13.  Patient was fitted for an LSO brace in the office today.  Orders: Diagnostic Procedures: Assessment Procedure  724.03 Lumbar Spine- AP/Lat/Obls/Spot/Flex/Ex  738.4 PLIF - L3-L4 - L4-L5 - L5-S1  Instruction(s)/Education: Assessment Instruction  401.9 Hypertension education  V85.37 Lifestyle education regarding diet             Provider:  Danae Orleans. Venetia Maxon MD  09/09/2013 08:01 AM Dictation edited by: Danae Orleans. Venetia Maxon    CC Providers: Sanda Linger [facility] [address] [place],  [place]  [address]-   Sanda Linger [facility] [address] [place], [place] [address]-  ----------------------------------------------------------------------------------------------------------------------------------------------------------------------         Electronically signed by Danae Orleans. Venetia Maxon MD on 09/09/2013 08:01 AM

## 2013-10-08 ENCOUNTER — Inpatient Hospital Stay (HOSPITAL_COMMUNITY): Payer: BC Managed Care – PPO | Admitting: Anesthesiology

## 2013-10-08 ENCOUNTER — Encounter (HOSPITAL_COMMUNITY)
Admission: RE | Disposition: A | Discharge status: Home / Self Care | Payer: Self-pay | Source: Ambulatory Visit | Attending: Neurosurgery

## 2013-10-08 ENCOUNTER — Encounter (HOSPITAL_COMMUNITY): Payer: BC Managed Care – PPO | Admitting: Vascular Surgery

## 2013-10-08 ENCOUNTER — Inpatient Hospital Stay (HOSPITAL_COMMUNITY): Payer: BC Managed Care – PPO

## 2013-10-08 ENCOUNTER — Encounter (HOSPITAL_COMMUNITY): Payer: Self-pay | Admitting: Anesthesiology

## 2013-10-08 ENCOUNTER — Inpatient Hospital Stay (HOSPITAL_COMMUNITY)
Admission: RE | Admit: 2013-10-08 | Discharge: 2013-10-12 | DRG: 460 | Disposition: A | Discharge status: Home / Self Care | Payer: BC Managed Care – PPO | Source: Ambulatory Visit | Attending: Neurosurgery | Admitting: Neurosurgery

## 2013-10-08 DIAGNOSIS — Z87891 Personal history of nicotine dependence: Secondary | ICD-10-CM

## 2013-10-08 DIAGNOSIS — M519 Unspecified thoracic, thoracolumbar and lumbosacral intervertebral disc disorder: Secondary | ICD-10-CM | POA: Diagnosis present

## 2013-10-08 DIAGNOSIS — M4310 Spondylolisthesis, site unspecified: Principal | ICD-10-CM | POA: Diagnosis present

## 2013-10-08 DIAGNOSIS — I10 Essential (primary) hypertension: Secondary | ICD-10-CM | POA: Diagnosis present

## 2013-10-08 DIAGNOSIS — Z6838 Body mass index (BMI) 38.0-38.9, adult: Secondary | ICD-10-CM

## 2013-10-08 DIAGNOSIS — E7800 Pure hypercholesterolemia, unspecified: Secondary | ICD-10-CM | POA: Diagnosis present

## 2013-10-08 DIAGNOSIS — M47817 Spondylosis without myelopathy or radiculopathy, lumbosacral region: Secondary | ICD-10-CM | POA: Diagnosis present

## 2013-10-08 DIAGNOSIS — M069 Rheumatoid arthritis, unspecified: Secondary | ICD-10-CM | POA: Diagnosis present

## 2013-10-08 DIAGNOSIS — M4316 Spondylolisthesis, lumbar region: Secondary | ICD-10-CM | POA: Diagnosis present

## 2013-10-08 DIAGNOSIS — J45909 Unspecified asthma, uncomplicated: Secondary | ICD-10-CM | POA: Diagnosis present

## 2013-10-08 DIAGNOSIS — E119 Type 2 diabetes mellitus without complications: Secondary | ICD-10-CM | POA: Diagnosis present

## 2013-10-08 DIAGNOSIS — M62838 Other muscle spasm: Secondary | ICD-10-CM | POA: Diagnosis not present

## 2013-10-08 DIAGNOSIS — F411 Generalized anxiety disorder: Secondary | ICD-10-CM | POA: Diagnosis not present

## 2013-10-08 HISTORY — PX: MAXIMUM ACCESS (MAS)POSTERIOR LUMBAR INTERBODY FUSION (PLIF) 3 LEVEL: SHX6370

## 2013-10-08 LAB — GLUCOSE, CAPILLARY
GLUCOSE-CAPILLARY: 144 mg/dL — AB (ref 70–99)
Glucose-Capillary: 151 mg/dL — ABNORMAL HIGH (ref 70–99)
Glucose-Capillary: 152 mg/dL — ABNORMAL HIGH (ref 70–99)
Glucose-Capillary: 119 mg/dL — ABNORMAL HIGH (ref 70–99)

## 2013-10-08 LAB — HEMOGLOBIN A1C
HEMOGLOBIN A1C: 7.1 % — AB (ref <5.7)
Mean Plasma Glucose: 157 mg/dL — ABNORMAL HIGH (ref <117)

## 2013-10-08 SURGERY — FOR MAXIMUM ACCESS (MAS) POSTERIOR LUMBAR INTERBODY FUSION (PLIF) 3 LEVEL
Anesthesia: General | Site: Back

## 2013-10-08 MED ORDER — ONDANSETRON HCL 4 MG/2ML IJ SOLN
INTRAMUSCULAR | Status: AC
  Filled 2013-10-08: qty 2

## 2013-10-08 MED ORDER — ATORVASTATIN CALCIUM 10 MG PO TABS
10.0000 mg | ORAL_TABLET | Freq: Every day | ORAL | Status: DC
  Administered 2013-10-08 – 2013-10-11 (×4): 10 mg via ORAL
  Filled 2013-10-08 (×5): qty 1

## 2013-10-08 MED ORDER — LISINOPRIL 20 MG PO TABS
20.0000 mg | ORAL_TABLET | Freq: Every day | ORAL | Status: DC
  Administered 2013-10-08 – 2013-10-12 (×4): 20 mg via ORAL
  Filled 2013-10-08 (×5): qty 1

## 2013-10-08 MED ORDER — PROPOFOL 10 MG/ML IV BOLUS
INTRAVENOUS | Status: AC
  Filled 2013-10-08: qty 20

## 2013-10-08 MED ORDER — SODIUM CHLORIDE 0.9 % IV SOLN: 250.0000 mL | INTRAVENOUS | Status: DC

## 2013-10-08 MED ORDER — THROMBIN 20000 UNITS EX SOLR
CUTANEOUS | Status: DC | PRN
  Administered 2013-10-08: 09:00:00 via TOPICAL

## 2013-10-08 MED ORDER — SODIUM CHLORIDE 0.9 % IJ SOLN
3.0000 mL | Freq: Two times a day (BID) | INTRAMUSCULAR | Status: DC
  Administered 2013-10-08 – 2013-10-11 (×7): 3 mL via INTRAVENOUS

## 2013-10-08 MED ORDER — LIDOCAINE HCL (CARDIAC) 20 MG/ML IV SOLN
INTRAVENOUS | Status: AC
  Filled 2013-10-08: qty 5

## 2013-10-08 MED ORDER — FENTANYL CITRATE 0.05 MG/ML IJ SOLN
INTRAMUSCULAR | Status: AC
  Filled 2013-10-08: qty 5

## 2013-10-08 MED ORDER — PHENOL 1.4 % MT LIQD: 1.0000 | OROMUCOSAL | Status: DC | PRN

## 2013-10-08 MED ORDER — LABETALOL HCL 5 MG/ML IV SOLN
INTRAVENOUS | Status: AC
  Filled 2013-10-08: qty 4

## 2013-10-08 MED ORDER — CEFAZOLIN SODIUM-DEXTROSE 2-3 GM-% IV SOLR
INTRAVENOUS | Status: AC
  Filled 2013-10-08: qty 50

## 2013-10-08 MED ORDER — HYDROCODONE-ACETAMINOPHEN 5-325 MG PO TABS: 1.0000 | ORAL_TABLET | Freq: Four times a day (QID) | ORAL | Status: DC | PRN

## 2013-10-08 MED ORDER — ACETAMINOPHEN 325 MG PO TABS: 650.0000 mg | ORAL_TABLET | ORAL | Status: DC | PRN

## 2013-10-08 MED ORDER — VANCOMYCIN HCL 1000 MG IV SOLR
INTRAVENOUS | Status: AC
  Filled 2013-10-08: qty 1000

## 2013-10-08 MED ORDER — HYDROMORPHONE HCL PF 1 MG/ML IJ SOLN
INTRAMUSCULAR | Status: DC | PRN
  Administered 2013-10-08 (×2): 0.5 mg via INTRAVENOUS

## 2013-10-08 MED ORDER — METFORMIN HCL 500 MG PO TABS
500.0000 mg | ORAL_TABLET | Freq: Two times a day (BID) | ORAL | Status: DC
  Administered 2013-10-08 – 2013-10-12 (×7): 500 mg via ORAL
  Filled 2013-10-08 (×10): qty 1

## 2013-10-08 MED ORDER — VANCOMYCIN HCL 1000 MG IV SOLR
INTRAVENOUS | Status: DC | PRN
  Administered 2013-10-08: 1000 mg

## 2013-10-08 MED ORDER — CHROMIUM-CINNAMON 200-1000 MCG-MG PO CAPS: 1.0000 | ORAL_CAPSULE | Freq: Every day | ORAL | Status: DC

## 2013-10-08 MED ORDER — LIDOCAINE-EPINEPHRINE 1 %-1:100000 IJ SOLN
INTRAMUSCULAR | Status: DC | PRN
  Administered 2013-10-08: 10 mL

## 2013-10-08 MED ORDER — PHENYLEPHRINE 40 MCG/ML (10ML) SYRINGE FOR IV PUSH (FOR BLOOD PRESSURE SUPPORT)
PREFILLED_SYRINGE | INTRAVENOUS | Status: AC
  Filled 2013-10-08: qty 10

## 2013-10-08 MED ORDER — DIAZEPAM 5 MG PO TABS
5.0000 mg | ORAL_TABLET | Freq: Four times a day (QID) | ORAL | Status: DC | PRN
  Administered 2013-10-09 – 2013-10-12 (×9): 5 mg via ORAL
  Filled 2013-10-08 (×9): qty 1

## 2013-10-08 MED ORDER — ARTIFICIAL TEARS OP OINT
TOPICAL_OINTMENT | OPHTHALMIC | Status: AC
  Filled 2013-10-08: qty 3.5

## 2013-10-08 MED ORDER — SENNA 8.6 MG PO TABS
1.0000 | ORAL_TABLET | Freq: Two times a day (BID) | ORAL | Status: DC
  Administered 2013-10-08 – 2013-10-12 (×6): 8.6 mg via ORAL
  Filled 2013-10-08 (×8): qty 1

## 2013-10-08 MED ORDER — PHENYLEPHRINE HCL 10 MG/ML IJ SOLN
10.0000 mg | INTRAVENOUS | Status: DC | PRN
  Administered 2013-10-08: 10 ug/min via INTRAVENOUS

## 2013-10-08 MED ORDER — DOCUSATE SODIUM 100 MG PO CAPS
100.0000 mg | ORAL_CAPSULE | Freq: Two times a day (BID) | ORAL | Status: DC
  Administered 2013-10-09 – 2013-10-11 (×4): 100 mg via ORAL
  Filled 2013-10-08 (×7): qty 1

## 2013-10-08 MED ORDER — SODIUM CHLORIDE 0.9 % IJ SOLN: 3.0000 mL | INTRAMUSCULAR | Status: DC | PRN

## 2013-10-08 MED ORDER — ADULT MULTIVITAMIN W/MINERALS CH
1.0000 | ORAL_TABLET | Freq: Every day | ORAL | Status: DC
  Administered 2013-10-08 – 2013-10-12 (×4): 1 via ORAL
  Filled 2013-10-08 (×5): qty 1

## 2013-10-08 MED ORDER — HYDROCODONE-ACETAMINOPHEN 5-325 MG PO TABS: 1.0000 | ORAL_TABLET | ORAL | Status: DC | PRN

## 2013-10-08 MED ORDER — HYDROMORPHONE HCL PF 1 MG/ML IJ SOLN
0.2500 mg | INTRAMUSCULAR | Status: DC | PRN
  Administered 2013-10-08 (×2): 0.5 mg via INTRAVENOUS

## 2013-10-08 MED ORDER — OXYCODONE-ACETAMINOPHEN 5-325 MG PO TABS
1.0000 | ORAL_TABLET | ORAL | Status: DC | PRN
Start: 2013-10-08 — End: 2013-10-12
  Administered 2013-10-08 – 2013-10-12 (×13): 2 via ORAL
  Filled 2013-10-08 (×14): qty 2

## 2013-10-08 MED ORDER — SUCCINYLCHOLINE CHLORIDE 20 MG/ML IJ SOLN
INTRAMUSCULAR | Status: AC
  Filled 2013-10-08: qty 1

## 2013-10-08 MED ORDER — LACTATED RINGERS IV SOLN
INTRAVENOUS | Status: DC | PRN
  Administered 2013-10-08 (×4): via INTRAVENOUS

## 2013-10-08 MED ORDER — OXYCODONE HCL 5 MG/5ML PO SOLN: 5.0000 mg | Freq: Once | ORAL | Status: DC | PRN

## 2013-10-08 MED ORDER — BUPIVACAINE HCL (PF) 0.5 % IJ SOLN
INTRAMUSCULAR | Status: DC | PRN
  Administered 2013-10-08: 10 mL

## 2013-10-08 MED ORDER — KCL IN DEXTROSE-NACL 20-5-0.45 MEQ/L-%-% IV SOLN
INTRAVENOUS | Status: DC
  Administered 2013-10-08 – 2013-10-10 (×3): via INTRAVENOUS
  Filled 2013-10-08 (×8): qty 1000

## 2013-10-08 MED ORDER — MENTHOL 3 MG MT LOZG: 1.0000 | LOZENGE | OROMUCOSAL | Status: DC | PRN

## 2013-10-08 MED ORDER — NIFEDIPINE ER 30 MG PO TB24
30.0000 mg | ORAL_TABLET | Freq: Every day | ORAL | Status: DC
  Administered 2013-10-08 – 2013-10-12 (×4): 30 mg via ORAL
  Filled 2013-10-08 (×5): qty 1

## 2013-10-08 MED ORDER — LIDOCAINE HCL 4 % MT SOLN
OROMUCOSAL | Status: DC | PRN
  Administered 2013-10-08: 2 mL via TOPICAL

## 2013-10-08 MED ORDER — FLEET ENEMA 7-19 GM/118ML RE ENEM: 1.0000 | ENEMA | Freq: Once | RECTAL | Status: AC | PRN

## 2013-10-08 MED ORDER — CEFAZOLIN SODIUM 1-5 GM-% IV SOLN
1.0000 g | Freq: Three times a day (TID) | INTRAVENOUS | Status: AC
  Administered 2013-10-08 – 2013-10-09 (×2): 1 g via INTRAVENOUS
  Filled 2013-10-08 (×3): qty 50

## 2013-10-08 MED ORDER — 0.9 % SODIUM CHLORIDE (POUR BTL) OPTIME
TOPICAL | Status: DC | PRN
  Administered 2013-10-08: 1000 mL

## 2013-10-08 MED ORDER — ALUM & MAG HYDROXIDE-SIMETH 200-200-20 MG/5ML PO SUSP: 30.0000 mL | Freq: Four times a day (QID) | ORAL | Status: DC | PRN

## 2013-10-08 MED ORDER — ONDANSETRON HCL 4 MG/2ML IJ SOLN
4.0000 mg | INTRAMUSCULAR | Status: DC | PRN
  Administered 2013-10-09: 4 mg via INTRAVENOUS
  Filled 2013-10-08: qty 2

## 2013-10-08 MED ORDER — FENTANYL CITRATE 0.05 MG/ML IJ SOLN
INTRAMUSCULAR | Status: DC | PRN
  Administered 2013-10-08: 100 ug via INTRAVENOUS
  Administered 2013-10-08 (×8): 50 ug via INTRAVENOUS
  Administered 2013-10-08: 100 ug via INTRAVENOUS
  Administered 2013-10-08 (×3): 50 ug via INTRAVENOUS
  Administered 2013-10-08 (×2): 100 ug via INTRAVENOUS
  Administered 2013-10-08: 50 ug via INTRAVENOUS

## 2013-10-08 MED ORDER — EPHEDRINE SULFATE 50 MG/ML IJ SOLN
INTRAMUSCULAR | Status: DC | PRN
  Administered 2013-10-08 (×2): 10 mg via INTRAVENOUS
  Administered 2013-10-08: 5 mg via INTRAVENOUS

## 2013-10-08 MED ORDER — ACETAMINOPHEN 650 MG RE SUPP: 650.0000 mg | RECTAL | Status: DC | PRN

## 2013-10-08 MED ORDER — HYDROCHLOROTHIAZIDE 25 MG PO TABS
25.0000 mg | ORAL_TABLET | Freq: Every day | ORAL | Status: DC
  Administered 2013-10-08 – 2013-10-12 (×4): 25 mg via ORAL
  Filled 2013-10-08 (×5): qty 1

## 2013-10-08 MED ORDER — PROPOFOL 10 MG/ML IV BOLUS
INTRAVENOUS | Status: DC | PRN
  Administered 2013-10-08: 250 mg via INTRAVENOUS
  Administered 2013-10-08: 50 mg via INTRAVENOUS

## 2013-10-08 MED ORDER — METOCLOPRAMIDE HCL 5 MG/ML IJ SOLN: 10.0000 mg | Freq: Once | INTRAMUSCULAR | Status: DC | PRN

## 2013-10-08 MED ORDER — LIDOCAINE HCL (CARDIAC) 20 MG/ML IV SOLN
INTRAVENOUS | Status: DC | PRN
  Administered 2013-10-08: 40 mg via INTRAVENOUS
  Administered 2013-10-08: 20 mg via INTRAVENOUS

## 2013-10-08 MED ORDER — PROPOFOL INFUSION 10 MG/ML OPTIME
INTRAVENOUS | Status: DC | PRN
  Administered 2013-10-08: 50 ug/kg/min via INTRAVENOUS

## 2013-10-08 MED ORDER — ONE-DAILY MULTI VITAMINS PO TABS: 1.0000 | ORAL_TABLET | Freq: Every day | ORAL | Status: DC

## 2013-10-08 MED ORDER — MIDAZOLAM HCL 5 MG/5ML IJ SOLN
INTRAMUSCULAR | Status: DC | PRN
  Administered 2013-10-08: 2 mg via INTRAVENOUS

## 2013-10-08 MED ORDER — GLYCOPYRROLATE 0.2 MG/ML IJ SOLN
INTRAMUSCULAR | Status: DC | PRN
  Administered 2013-10-08: 0.2 mg via INTRAVENOUS

## 2013-10-08 MED ORDER — MIDAZOLAM HCL 2 MG/2ML IJ SOLN
INTRAMUSCULAR | Status: AC
  Filled 2013-10-08: qty 2

## 2013-10-08 MED ORDER — POTASSIUM CHLORIDE CRYS ER 20 MEQ PO TBCR
20.0000 meq | EXTENDED_RELEASE_TABLET | Freq: Three times a day (TID) | ORAL | Status: DC
  Administered 2013-10-08 – 2013-10-12 (×9): 20 meq via ORAL
  Filled 2013-10-08 (×16): qty 1

## 2013-10-08 MED ORDER — CITALOPRAM HYDROBROMIDE 20 MG PO TABS
20.0000 mg | ORAL_TABLET | Freq: Every day | ORAL | Status: DC
  Administered 2013-10-08 – 2013-10-12 (×5): 20 mg via ORAL
  Filled 2013-10-08 (×5): qty 1

## 2013-10-08 MED ORDER — STERILE WATER FOR INJECTION IJ SOLN
INTRAMUSCULAR | Status: AC
  Filled 2013-10-08: qty 10

## 2013-10-08 MED ORDER — HYDROMORPHONE HCL PF 1 MG/ML IJ SOLN
INTRAMUSCULAR | Status: AC
  Filled 2013-10-08: qty 1

## 2013-10-08 MED ORDER — ONDANSETRON HCL 4 MG/2ML IJ SOLN
INTRAMUSCULAR | Status: DC | PRN
  Administered 2013-10-08: 4 mg via INTRAVENOUS

## 2013-10-08 MED ORDER — BUPIVACAINE LIPOSOME 1.3 % IJ SUSP
INTRAMUSCULAR | Status: DC | PRN
  Administered 2013-10-08: 20 mL

## 2013-10-08 MED ORDER — ALBUTEROL SULFATE (2.5 MG/3ML) 0.083% IN NEBU: 3.0000 mL | INHALATION_SOLUTION | Freq: Four times a day (QID) | RESPIRATORY_TRACT | Status: DC | PRN

## 2013-10-08 MED ORDER — INSULIN ASPART 100 UNIT/ML ~~LOC~~ SOLN: 0.0000 [IU] | Freq: Every day | SUBCUTANEOUS | Status: DC

## 2013-10-08 MED ORDER — INSULIN ASPART 100 UNIT/ML ~~LOC~~ SOLN
0.0000 [IU] | Freq: Three times a day (TID) | SUBCUTANEOUS | Status: DC
Start: 2013-10-08 — End: 2013-10-12
  Administered 2013-10-08: 3 [IU] via SUBCUTANEOUS
  Administered 2013-10-09: 2 [IU] via SUBCUTANEOUS
  Administered 2013-10-10: 5 [IU] via SUBCUTANEOUS
  Administered 2013-10-10: 2 [IU] via SUBCUTANEOUS
  Administered 2013-10-11 (×2): 3 [IU] via SUBCUTANEOUS

## 2013-10-08 MED ORDER — MORPHINE SULFATE 2 MG/ML IJ SOLN
1.0000 mg | INTRAMUSCULAR | Status: DC | PRN
  Administered 2013-10-08 – 2013-10-09 (×2): 4 mg via INTRAVENOUS
  Administered 2013-10-09 – 2013-10-10 (×4): 2 mg via INTRAVENOUS
  Filled 2013-10-08: qty 2
  Filled 2013-10-08 (×4): qty 1
  Filled 2013-10-08: qty 2

## 2013-10-08 MED ORDER — INSULIN ASPART 100 UNIT/ML ~~LOC~~ SOLN
4.0000 [IU] | Freq: Three times a day (TID) | SUBCUTANEOUS | Status: DC
  Administered 2013-10-08 – 2013-10-12 (×10): 4 [IU] via SUBCUTANEOUS

## 2013-10-08 MED ORDER — ARTIFICIAL TEARS OP OINT
TOPICAL_OINTMENT | OPHTHALMIC | Status: DC | PRN
  Administered 2013-10-08: 1 via OPHTHALMIC

## 2013-10-08 MED ORDER — OXYCODONE HCL 5 MG PO TABS: 5.0000 mg | ORAL_TABLET | Freq: Once | ORAL | Status: DC | PRN

## 2013-10-08 MED ORDER — SODIUM CHLORIDE 0.9 % IV SOLN
INTRAVENOUS | Status: DC | PRN
  Administered 2013-10-08: 13:00:00 via INTRAVENOUS

## 2013-10-08 MED ORDER — LABETALOL HCL 5 MG/ML IV SOLN
INTRAVENOUS | Status: DC | PRN
  Administered 2013-10-08 (×3): 5 mg via INTRAVENOUS

## 2013-10-08 MED ORDER — PANTOPRAZOLE SODIUM 40 MG IV SOLR
40.0000 mg | Freq: Every day | INTRAVENOUS | Status: DC
  Administered 2013-10-09 (×2): 40 mg via INTRAVENOUS
  Filled 2013-10-08 (×4): qty 40

## 2013-10-08 MED ORDER — BUPIVACAINE LIPOSOME 1.3 % IJ SUSP
20.0000 mL | INTRAMUSCULAR | Status: DC
  Filled 2013-10-08: qty 20

## 2013-10-08 MED ORDER — EPHEDRINE SULFATE 50 MG/ML IJ SOLN
INTRAMUSCULAR | Status: AC
  Filled 2013-10-08: qty 1

## 2013-10-08 MED ORDER — ALBUMIN HUMAN 5 % IV SOLN
INTRAVENOUS | Status: DC | PRN
  Administered 2013-10-08 (×2): via INTRAVENOUS

## 2013-10-08 MED ORDER — ATENOLOL 100 MG PO TABS
100.0000 mg | ORAL_TABLET | Freq: Every day | ORAL | Status: DC
  Administered 2013-10-08 – 2013-10-12 (×4): 100 mg via ORAL
  Filled 2013-10-08 (×5): qty 1

## 2013-10-08 MED ORDER — GABAPENTIN 600 MG PO TABS
600.0000 mg | ORAL_TABLET | Freq: Three times a day (TID) | ORAL | Status: DC
  Administered 2013-10-08 – 2013-10-12 (×11): 600 mg via ORAL
  Filled 2013-10-08 (×15): qty 1

## 2013-10-08 MED ORDER — SUCCINYLCHOLINE CHLORIDE 20 MG/ML IJ SOLN
INTRAMUSCULAR | Status: DC | PRN
  Administered 2013-10-08: 120 mg via INTRAVENOUS

## 2013-10-08 MED ORDER — SENNOSIDES-DOCUSATE SODIUM 8.6-50 MG PO TABS: 1.0000 | ORAL_TABLET | Freq: Every evening | ORAL | Status: DC | PRN

## 2013-10-08 MED ORDER — BISACODYL 10 MG RE SUPP: 10.0000 mg | Freq: Every day | RECTAL | Status: DC | PRN

## 2013-10-08 SURGICAL SUPPLY — 99 items
BAG DECANTER FOR FLEXI CONT (MISCELLANEOUS) ×3 IMPLANT
BENZOIN TINCTURE PRP APPL 2/3 (GAUZE/BANDAGES/DRESSINGS) ×3 IMPLANT
BLADE 10 SAFETY STRL DISP (BLADE) IMPLANT
BLADE SURG ROTATE 9660 (MISCELLANEOUS) IMPLANT
BONE MATRIX OSTEOCEL PRO MED (Bone Implant) ×9 IMPLANT
BUR MATCHSTICK NEURO 3.0 LAGG (BURR) ×3 IMPLANT
BUR PRECISION FLUTE 5.0 (BURR) ×3 IMPLANT
CAGE COROENT LG 10X9X23-12 (Cage) ×6 IMPLANT
CAGE PLIF 8X9X23-12 LUMBAR (Cage) ×12 IMPLANT
CANISTER SUCT 3000ML (MISCELLANEOUS) ×3 IMPLANT
CLIP NEUROVISION LG (CLIP) ×3 IMPLANT
CLOSURE WOUND 1/2 X4 (GAUZE/BANDAGES/DRESSINGS) ×1
CONT SPEC 4OZ CLIKSEAL STRL BL (MISCELLANEOUS) ×6 IMPLANT
COVER BACK TABLE 24X17X13 BIG (DRAPES) IMPLANT
COVER TABLE BACK 60X90 (DRAPES) ×3 IMPLANT
DERMABOND ADHESIVE PROPEN (GAUZE/BANDAGES/DRESSINGS) ×2
DERMABOND ADVANCED (GAUZE/BANDAGES/DRESSINGS) ×2
DERMABOND ADVANCED .7 DNX12 (GAUZE/BANDAGES/DRESSINGS) ×1 IMPLANT
DERMABOND ADVANCED .7 DNX6 (GAUZE/BANDAGES/DRESSINGS) ×1 IMPLANT
DRAPE C-ARM 42X72 X-RAY (DRAPES) ×6 IMPLANT
DRAPE LAPAROTOMY 100X72X124 (DRAPES) ×3 IMPLANT
DRAPE POUCH INSTRU U-SHP 10X18 (DRAPES) ×3 IMPLANT
DRAPE SURG 17X23 STRL (DRAPES) ×3 IMPLANT
DRESSING TELFA 8X3 (GAUZE/BANDAGES/DRESSINGS) IMPLANT
DRSG OPSITE 4X5.5 SM (GAUZE/BANDAGES/DRESSINGS) ×3 IMPLANT
DRSG OPSITE POSTOP 4X8 (GAUZE/BANDAGES/DRESSINGS) ×3 IMPLANT
DURAPREP 26ML APPLICATOR (WOUND CARE) ×3 IMPLANT
ELECT BLADE 4.0 EZ CLEAN MEGAD (MISCELLANEOUS) ×3
ELECT REM PT RETURN 9FT ADLT (ELECTROSURGICAL) ×3
ELECTRODE BLDE 4.0 EZ CLN MEGD (MISCELLANEOUS) ×1 IMPLANT
ELECTRODE REM PT RTRN 9FT ADLT (ELECTROSURGICAL) ×1 IMPLANT
EVACUATOR 1/8 PVC DRAIN (DRAIN) IMPLANT
GAUZE SPONGE 4X4 16PLY XRAY LF (GAUZE/BANDAGES/DRESSINGS) IMPLANT
GLOVE BIO SURGEON STRL SZ8 (GLOVE) ×9 IMPLANT
GLOVE BIOGEL PI IND STRL 7.0 (GLOVE) ×1 IMPLANT
GLOVE BIOGEL PI IND STRL 7.5 (GLOVE) ×1 IMPLANT
GLOVE BIOGEL PI IND STRL 8 (GLOVE) ×2 IMPLANT
GLOVE BIOGEL PI IND STRL 8.5 (GLOVE) ×2 IMPLANT
GLOVE BIOGEL PI INDICATOR 7.0 (GLOVE) ×2
GLOVE BIOGEL PI INDICATOR 7.5 (GLOVE) ×2
GLOVE BIOGEL PI INDICATOR 8 (GLOVE) ×4
GLOVE BIOGEL PI INDICATOR 8.5 (GLOVE) ×4
GLOVE ECLIPSE 7.0 STRL STRAW (GLOVE) ×3 IMPLANT
GLOVE ECLIPSE 8.0 STRL XLNG CF (GLOVE) ×6 IMPLANT
GLOVE EXAM NITRILE LRG STRL (GLOVE) IMPLANT
GLOVE EXAM NITRILE MD LF STRL (GLOVE) IMPLANT
GLOVE EXAM NITRILE XL STR (GLOVE) IMPLANT
GLOVE EXAM NITRILE XS STR PU (GLOVE) IMPLANT
GLOVE SS N UNI LF 7.0 STRL (GLOVE) ×12 IMPLANT
GOWN BRE IMP SLV AUR LG STRL (GOWN DISPOSABLE) IMPLANT
GOWN BRE IMP SLV AUR XL STRL (GOWN DISPOSABLE) ×9 IMPLANT
GOWN STRL REIN 2XL LVL4 (GOWN DISPOSABLE) IMPLANT
GOWN STRL REUS W/ TWL LRG LVL3 (GOWN DISPOSABLE) ×1 IMPLANT
GOWN STRL REUS W/ TWL XL LVL3 (GOWN DISPOSABLE) ×3 IMPLANT
GOWN STRL REUS W/TWL 2XL LVL3 (GOWN DISPOSABLE) ×3 IMPLANT
GOWN STRL REUS W/TWL LRG LVL3 (GOWN DISPOSABLE) ×2
GOWN STRL REUS W/TWL XL LVL3 (GOWN DISPOSABLE) ×6
KIT BASIN OR (CUSTOM PROCEDURE TRAY) ×3 IMPLANT
KIT NEEDLE NVM5 EMG ELECT (KITS) ×1 IMPLANT
KIT NEEDLE NVM5 EMG ELECTRODE (KITS) ×2
KIT POSITION SURG JACKSON T1 (MISCELLANEOUS) ×3 IMPLANT
KIT ROOM TURNOVER OR (KITS) ×3 IMPLANT
MILL MEDIUM DISP (BLADE) ×3 IMPLANT
NEEDLE HYPO 25X1 1.5 SAFETY (NEEDLE) ×3 IMPLANT
NEEDLE SPNL 18GX3.5 QUINCKE PK (NEEDLE) IMPLANT
NS IRRIG 1000ML POUR BTL (IV SOLUTION) ×3 IMPLANT
PACK LAMINECTOMY NEURO (CUSTOM PROCEDURE TRAY) ×3 IMPLANT
PAD ARMBOARD 7.5X6 YLW CONV (MISCELLANEOUS) ×9 IMPLANT
PATTIES SURGICAL .5 X.5 (GAUZE/BANDAGES/DRESSINGS) IMPLANT
PATTIES SURGICAL .5 X1 (DISPOSABLE) IMPLANT
PATTIES SURGICAL 1X1 (DISPOSABLE) IMPLANT
ROD PREBENT PLIF 90MM (Rod) ×6 IMPLANT
SCREW 5.0X30 (Screw) ×3 IMPLANT
SCREW LOCK (Screw) ×16 IMPLANT
SCREW LOCK FXNS SPNE MAS PL (Screw) ×8 IMPLANT
SCREW MAS PLIF 5.5X30 (Screw) ×8 IMPLANT
SCREW PAS PLIF 5X30 (Screw) ×4 IMPLANT
SCREW PLIF MAS 5.5X35 LUMBAR (Screw) ×3 IMPLANT
SCREW SHANK 5.0X30MM (Screw) ×6 IMPLANT
SCREW TULIP 5.5 (Screw) ×6 IMPLANT
SPONGE GAUZE 4X4 12PLY (GAUZE/BANDAGES/DRESSINGS) ×3 IMPLANT
SPONGE LAP 4X18 X RAY DECT (DISPOSABLE) IMPLANT
SPONGE SURGIFOAM ABS GEL 100 (HEMOSTASIS) ×3 IMPLANT
STAPLER SKIN PROX WIDE 3.9 (STAPLE) IMPLANT
STRIP CLOSURE SKIN 1/2X4 (GAUZE/BANDAGES/DRESSINGS) ×2 IMPLANT
SUT VIC AB 1 CT1 18XBRD ANBCTR (SUTURE) ×2 IMPLANT
SUT VIC AB 1 CT1 8-18 (SUTURE) ×4
SUT VIC AB 2-0 CT1 18 (SUTURE) ×6 IMPLANT
SUT VIC AB 3-0 SH 8-18 (SUTURE) ×6 IMPLANT
SYR 20CC LL (SYRINGE) ×3 IMPLANT
SYR 20ML ECCENTRIC (SYRINGE) ×3 IMPLANT
SYR 3ML LL SCALE MARK (SYRINGE) ×12 IMPLANT
SYR 5ML LL (SYRINGE) IMPLANT
TAPE STRIPS DRAPE STRL (GAUZE/BANDAGES/DRESSINGS) ×3 IMPLANT
TOWEL OR 17X24 6PK STRL BLUE (TOWEL DISPOSABLE) ×3 IMPLANT
TOWEL OR 17X26 10 PK STRL BLUE (TOWEL DISPOSABLE) ×3 IMPLANT
TRAP SPECIMEN MUCOUS 40CC (MISCELLANEOUS) ×3 IMPLANT
TRAY FOLEY CATH 14FRSI W/METER (CATHETERS) ×3 IMPLANT
WATER STERILE IRR 1000ML POUR (IV SOLUTION) ×3 IMPLANT

## 2013-10-08 NOTE — Interval H&P Note (Signed)
History and Physical Interval Note:  10/08/2013 7:21 AM  Emily Wheeler  has presented today for surgery, with the diagnosis of Spondylolisthesis, Lumbar stenosis, Lumbar spondylosis, Lumbar radiculopathy  The various methods of treatment have been discussed with the patient and family. After consideration of risks, benefits and other options for treatment, the patient has consented to  Procedure(s) with comments: FOR MAXIMUM ACCESS (MAS) POSTERIOR LUMBAR INTERBODY FUSION (PLIF) 3 LEVEL (N/A) - L3-4 L4-5 L5-S1 maximum access posterior lumbar interbody fusion with decompression as a surgical intervention .  The patient's history has been reviewed, patient examined, no change in status, stable for surgery.  I have reviewed the patient's chart and labs.  Questions were answered to the patient's satisfaction.     Maeola Harman

## 2013-10-08 NOTE — Anesthesia Preprocedure Evaluation (Signed)
Anesthesia Evaluation  Patient identified by MRN, date of birth, ID band Patient awake    Reviewed: Allergy & Precautions, H&P , NPO status , Patient's Chart, lab work & pertinent test results, reviewed documented beta blocker date and time   Airway Mallampati: II TM Distance: >3 FB Neck ROM: full    Dental   Pulmonary asthma , former smoker,  breath sounds clear to auscultation        Cardiovascular hypertension, On Medications and On Home Beta Blockers Rhythm:regular     Neuro/Psych negative neurological ROS  negative psych ROS   GI/Hepatic negative GI ROS, Neg liver ROS,   Endo/Other  diabetes, Oral Hypoglycemic AgentsMorbid obesity  Renal/GU negative Renal ROS  negative genitourinary   Musculoskeletal   Abdominal   Peds  Hematology negative hematology ROS (+)   Anesthesia Other Findings See surgeon's H&P   Reproductive/Obstetrics negative OB ROS                           Anesthesia Physical Anesthesia Plan  ASA: III  Anesthesia Plan: General   Post-op Pain Management:    Induction: Intravenous  Airway Management Planned: Oral ETT  Additional Equipment:   Intra-op Plan:   Post-operative Plan: Extubation in OR  Informed Consent: I have reviewed the patients History and Physical, chart, labs and discussed the procedure including the risks, benefits and alternatives for the proposed anesthesia with the patient or authorized representative who has indicated his/her understanding and acceptance.   Dental Advisory Given  Plan Discussed with: CRNA and Surgeon  Anesthesia Plan Comments:         Anesthesia Quick Evaluation

## 2013-10-08 NOTE — Progress Notes (Signed)
PHARMACIST - PHYSICIAN ORDER COMMUNICATION  CONCERNING: P&T Medication Policy on Herbal Medications  DESCRIPTION:  This patient's order for:  Chromium-Cinnamon  has been noted.  This product(s) is classified as an "herbal" or natural product. Due to a lack of definitive safety studies or FDA approval, nonstandard manufacturing practices, plus the potential risk of unknown drug-drug interactions while on inpatient medications, the Pharmacy and Therapeutics Committee does not permit the use of "herbal" or natural products of this type within [facility].   ACTION TAKEN: The pharmacy department is unable to verify this order at this time and your patient has been informed of this safety policy. Please reevaluate patient's clinical condition at discharge and address if the herbal or natural product(s) should be resumed at that time.   Marisue Humble, PharmD Clinical Pharmacist [facility]

## 2013-10-08 NOTE — Op Note (Signed)
10/08/2013  2:19 PM  PATIENT:  Emily Wheeler  58 y.o. female  PRE-OPERATIVE DIAGNOSIS:  Spondylolisthesis, Lumbar stenosis, Lumbar spondylosis, Lumbar radiculopathy, Rheumatoid arthritis L 34, L 45, L 5 S1 levels  POST-OPERATIVE DIAGNOSIS:  Spondylolisthesis, Lumbar stenosis, Lumbar spondylosis, Lumbar radiculopathy, Rheumatoid arthritis L 34, L 45, L 5 S1 levels   PROCEDURE:  Procedure(s) with comments: FOR MAXIMUM ACCESS (MAS) POSTERIOR LUMBAR INTERBODY FUSION (PLIF) 3 LEVEL (N/A) - L3-4 L4-5 L5-S1 maximum access posterior lumbar interbody fusion with decompression Decompression greater than typical for standard posterior decompression and fusion at each level  SURGEON:  Surgeon(s) and Role:    * Edia Pursifull, MD - Primary    * David S Jones, MD - Assisting  PHYSICIAN ASSISTANT:   ASSISTANTS: Poteat, RN   ANESTHESIA:   general  EBL:  Total I/O In: 4240 [I.V.:3500; Blood:240; IV Piggyback:500] Out: 1150 [Urine:250; Blood:900]  BLOOD ADMINISTERED:200 CC PRBC  DRAINS: (Medium) Hemovact drain(s) in the epidural space with  Suction Open   LOCAL MEDICATIONS USED:  MARCAINE     SPECIMEN:  No Specimen  DISPOSITION OF SPECIMEN:  N/A  COUNTS:  YES  TOURNIQUET:  * No tourniquets in log *  DICTATION: DICTATION: Patient is a 58-year-old with spondylosis , stenosis, spondylolisthesis, disc herniation and severe back and bilateral lower extremity pain at L 34, L45, L 5 S1  levels of the lumbar spine. It was elected to take her to surgery for Decompression with MASPLIFat L 34, L45, L 5 S1 levels  with posterolateral arthrodesis.  Procedure:   Following uncomplicated induction of GETA, and placement of electrodes for neural monitoring, patient was turned into a prone position on the [place] tableand using AP  fluoroscopy the area of planned incision was marked, prepped with betadine scrub and Duraprep, then draped. Exposure was performed of facet joint complex at at L 34, L45, L 5  S1  levels and the MAS retractor was placed. 5.0 x 30 mm cortical Nuvasive screws were placed at L 3 bilaterally according to standard landmarks using neural monitoring.  A total laminectomy of L 3, 4, 5 S 1 was then performed with disarticulation of facets.  Decompression was performed with painstaking dissection of scarred in neural elements.  This dissection was far more involved than in standard PLIF procedure. This bone was saved for grafting, combined with Osteocel after being run through bone mill and was placed in bone packing device.  Thorough discectomy was performed bilaterally at at L 34, L45, L 5 S1  levels  and the endplates were prepared for grafting.  23 x 8 x 12 degree cages were placed in the interspace and positioning was confirmed with AP and lateral fluoroscopy at both the L 34 and L 5 S1 levels.  23 x 10 x 12 degree cages were placed at L 45 level.  I was able to rotate the left cage fully, but not completely on the right. 10 cc of autograft/Osteocel was packed in the interspace medial to the second cage at each level.   Remaining screws were placed at L 4,  L 5 and S 1 levels  and 90 mm rods were placed.   And the screws were locked and torqued.Final Xrays showed well positioned implants and screw fixation. The posterolateral region was packed with remaining 60 cc of autograft (30 cc on each side of midline). A medium Hemovac drain was placed. The wounds were irrigated with Vancomycin and then closed with 1, 2-0 and 3-0 Vicryl   stitches. Sterile occlusive dressing was placed with Dermabond. The patient was then extubated in the operating room and taken to recovery in stable and satisfactory condition having tolerated her operation well. Counts were correct at the end of the case.  PLAN OF CARE: Admit to inpatient   PATIENT DISPOSITION:  PACU - hemodynamically stable.   Delay start of Pharmacological VTE agent (>24hrs) due to surgical blood loss or risk of bleeding: yes  

## 2013-10-08 NOTE — Transfer of Care (Signed)
Immediate Anesthesia Transfer of Care Note  Patient: Emily Wheeler  Procedure(s) Performed: Procedure(s) with comments: FOR MAXIMUM ACCESS (MAS) POSTERIOR LUMBAR INTERBODY FUSION (PLIF) 3 LEVEL (N/A) - L3-4 L4-5 L5-S1 maximum access posterior lumbar interbody fusion with decompression  Patient Location: PACU  Anesthesia Type:General  Level of Consciousness: awake, patient cooperative and responds to stimulation  Airway & Oxygen Therapy: Patient Spontanous Breathing and Patient connected to nasal cannula oxygen  Post-op Assessment: Report given to PACU RN, Post -op Vital signs reviewed and stable and Patient moving all extremities X 4  Post vital signs: Reviewed and stable  Complications: No apparent anesthesia complications

## 2013-10-08 NOTE — Brief Op Note (Signed)
10/08/2013  2:19 PM  PATIENT:  Emily Wheeler  58 y.o. female  PRE-OPERATIVE DIAGNOSIS:  Spondylolisthesis, Lumbar stenosis, Lumbar spondylosis, Lumbar radiculopathy, Rheumatoid arthritis L 34, L 45, L 5 S1 levels  POST-OPERATIVE DIAGNOSIS:  Spondylolisthesis, Lumbar stenosis, Lumbar spondylosis, Lumbar radiculopathy, Rheumatoid arthritis L 34, L 45, L 5 S1 levels   PROCEDURE:  Procedure(s) with comments: FOR MAXIMUM ACCESS (MAS) POSTERIOR LUMBAR INTERBODY FUSION (PLIF) 3 LEVEL (N/A) - L3-4 L4-5 L5-S1 maximum access posterior lumbar interbody fusion with decompression Decompression greater than typical for standard posterior decompression and fusion at each level  SURGEON:  Surgeon(s) and Role:    * Maeola Harman, MD - Primary    * Tia Alert, MD - Assisting  PHYSICIAN ASSISTANT:   ASSISTANTS: Poteat, RN   ANESTHESIA:   general  EBL:  Total I/O In: 4240 [I.V.:3500; Blood:240; IV Piggyback:500] Out: 1150 [Urine:250; Blood:900]  BLOOD ADMINISTERED:200 CC PRBC  DRAINS: (Medium) Hemovact drain(s) in the epidural space with  Suction Open   LOCAL MEDICATIONS USED:  MARCAINE     SPECIMEN:  No Specimen  DISPOSITION OF SPECIMEN:  N/A  COUNTS:  YES  TOURNIQUET:  * No tourniquets in log *  DICTATION: DICTATION: Patient is a 58 year old with spondylosis , stenosis, spondylolisthesis, disc herniation and severe back and bilateral lower extremity pain at L 34, L45, L 5 S1  levels of the lumbar spine. It was elected to take her to surgery for Decompression with MASPLIFat L 34, L45, L 5 S1 levels  with posterolateral arthrodesis.  Procedure:   Following uncomplicated induction of GETA, and placement of electrodes for neural monitoring, patient was turned into a prone position on the [place] tableand using AP  fluoroscopy the area of planned incision was marked, prepped with betadine scrub and Duraprep, then draped. Exposure was performed of facet joint complex at at L 34, L45, L 5  S1  levels and the MAS retractor was placed. 5.0 x 30 mm cortical Nuvasive screws were placed at L 3 bilaterally according to standard landmarks using neural monitoring.  A total laminectomy of L 3, 4, 5 S 1 was then performed with disarticulation of facets.  Decompression was performed with painstaking dissection of scarred in neural elements.  This dissection was far more involved than in standard PLIF procedure. This bone was saved for grafting, combined with Osteocel after being run through bone mill and was placed in bone packing device.  Thorough discectomy was performed bilaterally at at L 34, L45, L 5 S1  levels  and the endplates were prepared for grafting.  23 x 8 x 12 degree cages were placed in the interspace and positioning was confirmed with AP and lateral fluoroscopy at both the L 34 and L 5 S1 levels.  23 x 10 x 12 degree cages were placed at L 45 level.  I was able to rotate the left cage fully, but not completely on the right. 10 cc of autograft/Osteocel was packed in the interspace medial to the second cage at each level.   Remaining screws were placed at L 4,  L 5 and S 1 levels  and 90 mm rods were placed.   And the screws were locked and torqued.Final Xrays showed well positioned implants and screw fixation. The posterolateral region was packed with remaining 60 cc of autograft (30 cc on each side of midline). A medium Hemovac drain was placed. The wounds were irrigated with Vancomycin and then closed with 1, 2-0 and 3-0 Vicryl  stitches. Sterile occlusive dressing was placed with Dermabond. The patient was then extubated in the operating room and taken to recovery in stable and satisfactory condition having tolerated her operation well. Counts were correct at the end of the case.  PLAN OF CARE: Admit to inpatient   PATIENT DISPOSITION:  PACU - hemodynamically stable.   Delay start of Pharmacological VTE agent (>24hrs) due to surgical blood loss or risk of bleeding: yes

## 2013-10-08 NOTE — Progress Notes (Signed)
Awake, alert, conversant.  Full strength both lower extremities.  Doing well.  

## 2013-10-08 NOTE — Progress Notes (Signed)
Patient admitted to the floor via OR.Patient alert and oriented x4.

## 2013-10-08 NOTE — Progress Notes (Signed)
Pt has on pad from bleeding hemmoroids today

## 2013-10-08 NOTE — Anesthesia Postprocedure Evaluation (Signed)
Anesthesia Post Note  Patient: Emily Wheeler  Procedure(s) Performed: Procedure(s) (LRB): FOR MAXIMUM ACCESS (MAS) POSTERIOR LUMBAR INTERBODY FUSION (PLIF) 3 LEVEL (N/A)  Anesthesia type: General  Patient location: PACU  Post pain: Pain level controlled  Post assessment: Patient's Cardiovascular Status Stable  Last Vitals:  Filed Vitals:   10/08/13 1416  BP: 122/71  Pulse: 97  Temp:   Resp: 22    Post vital signs: Reviewed and stable  Level of consciousness: alert  Complications: No apparent anesthesia complications

## 2013-10-09 LAB — GLUCOSE, CAPILLARY
GLUCOSE-CAPILLARY: 169 mg/dL — AB (ref 70–99)
Glucose-Capillary: 167 mg/dL — ABNORMAL HIGH (ref 70–99)
Glucose-Capillary: 123 mg/dL — ABNORMAL HIGH (ref 70–99)
Glucose-Capillary: 192 mg/dL — ABNORMAL HIGH (ref 70–99)

## 2013-10-09 NOTE — Evaluation (Signed)
Physical Therapy Evaluation Patient Details Name: Emily Wheeler MRN: [id number] DOB: 27-Feb-1956 Today's Date: 10/09/2013   History of Present Illness  Pt is 58 y.o. Female s/p PLIF 3 level for spondylolisthesis of lumbar region on 10/08/13.  Clinical Impression  Pt admitted with/for back pain, weakness and numbness. S/P multi-level lumbar PLIF.  Pt currently limited functionally due to the problems listed below.  (see problems list.)  Pt will benefit from PT to maximize function and safety to be able to get home safely with available assist of family.     Follow Up Recommendations Other (comment) (likely will not need followup)    Equipment Recommendations  None recommended by PT    Recommendations for Other Services       Precautions / Restrictions Precautions Precautions: Back Precaution Booklet Issued: Yes (comment) Precaution Comments: Educated pt on 3/3 back precautions and incorporating into ADLs. Required Braces or Orthoses: Spinal Brace Spinal Brace: Lumbar corset;Applied in sitting position Restrictions Weight Bearing Restrictions: No      Mobility  Bed Mobility Overal bed mobility: Needs Assistance Bed Mobility: Sidelying to Sit;Sit to Sidelying   Sidelying to sit: Min assist     Sit to sidelying: Min assist General bed mobility comments: cues for log roll and best technique to EOB.  Truncal assist needed  Transfers Overall transfer level: Needs assistance Equipment used: Rolling walker (2 wheeled) Transfers: Sit to/from [employer] Sit to Stand: Min assist Stand pivot transfers: Min assist       General transfer comment: cues for hand placement, sequencing/safe technique;  mild lifting assist  Ambulation/Gait Ambulation/Gait assistance: Min assist;Min guard Ambulation Distance (Feet): 100 Feet Assistive device: Rolling walker (2 wheeled) Gait Pattern/deviations: Step-through pattern   Gait velocity interpretation: Below normal  speed for age/gender General Gait Details: mildly unsteady at times.  Heavy use of the RW  Stairs            Wheelchair Mobility    Modified Rankin (Stroke Patients Only)       Balance Overall balance assessment: No apparent balance deficits (not formally assessed) Sitting-balance support: No upper extremity supported;Feet supported Sitting balance-Leahy Scale: Fair     Standing balance support: Single extremity supported;During functional activity Standing balance-Leahy Scale: Poor Standing balance comment: Pt required one person hand held assist for functional mobility and balance.                             Pertinent Vitals/Pain 5/10 ~45 minutes after pain meds    Home Living Family/patient expects to be discharged to:: Private residence Living Arrangements: Other relatives (pt lives with brother and sister-in-law) Available Help at Discharge: Family;Available 24 hours/day Type of Home: House Home Access: Stairs to enter Entrance Stairs-Rails: None Entrance Stairs-Number of Steps: 3 Home Layout: One level Home Equipment: Shower seat;Bedside commode;Grab bars - tub/shower;Hand held shower head      Prior Function Level of Independence: Independent         Comments: Pt reports she was independent, however had difficulty with LB ADLs.     Hand Dominance   Dominant Hand: Right    Extremity/Trunk Assessment   Upper Extremity Assessment: Overall WFL for tasks assessed           Lower Extremity Assessment: Generalized weakness      Cervical / Trunk Assessment: Normal  Communication   Communication: No difficulties  Cognition Arousal/Alertness: Awake/alert Behavior During Therapy: WFL for tasks assessed/performed Overall  Cognitive Status: Within Functional Limits for tasks assessed       Memory: Decreased recall of precautions              General Comments General comments (skin integrity, edema, etc.): Educated/reinforced  back prec., lifting restrictions, bracing issues and progression of activity as well as practice on bed mobility.    Exercises        Assessment/Plan    PT Assessment Patient needs continued PT services  PT Diagnosis Acute pain;Generalized weakness   PT Problem List Decreased strength;Decreased activity tolerance;Decreased mobility;Decreased knowledge of precautions;Pain  PT Treatment Interventions DME instruction;Gait training;Stair training;Functional mobility training;Therapeutic activities;Patient/family education   PT Goals (Current goals can be found in the Care Plan section) Acute Rehab PT Goals Patient Stated Goal: To return home PT Goal Formulation: With patient Time For Goal Achievement: 10/16/13 Potential to Achieve Goals: Good    Frequency Min 5X/week   Barriers to discharge        Co-evaluation               End of Session Equipment Utilized During Treatment: Back brace Activity Tolerance: Patient tolerated treatment well Patient left: in bed;with call bell/phone within reach Nurse Communication: Mobility status         Time: 1520-1550 PT Time Calculation (min): 30 min   Charges:   PT Evaluation $Initial PT Evaluation Tier I: 1 Procedure PT Treatments $Gait Training: 8-22 mins $Self Care/Home Management: 8-22   PT G CodesEliseo Gum Brodi Kari 10/09/2013, 4:00 PM 10/09/2013  Badger Lee Bing, PT [phone]  (pager)

## 2013-10-09 NOTE — Progress Notes (Signed)
Occupational Therapy Evaluation Patient Details Name: Emily Wheeler MRN: [id number] DOB: 26-Nov-1955 Today's Date: 10/09/2013    History of Present Illness Pt is 58 y.o. Female s/p PLIF 3 level for spondylolisthesis of lumbar region on 10/08/13.   Clinical Impression   PTA pt lived at home with her brother and sister-in-law and was independent with ADLs and functional mobility, however she reports LB ADLs were very difficult. Pt participated fully in education and training. Pt would benefit from continued OT to increase independence prior to d/c home.     Follow Up Recommendations  No OT follow up;Supervision/Assistance - 24 hour    Equipment Recommendations  None recommended by OT       Precautions / Restrictions Precautions Precautions: Back Precaution Booklet Issued: Yes (comment) Precaution Comments: Educated pt on 3/3 back precautions and incorporating into ADLs. Required Braces or Orthoses: Spinal Brace Spinal Brace: Lumbar corset;Applied in sitting position Restrictions Weight Bearing Restrictions: No      Mobility Bed Mobility               General bed mobility comments: Pt sitting on BSC when OT arrived- pt reports she performed log roll to get OOB. Reviewed log roll technique and demonstrated for pt.   Transfers Overall transfer level: Needs assistance Equipment used: 1 person hand held assist Transfers: Sit to/from [employer] Sit to Stand: Min assist Stand pivot transfers: Min assist       General transfer comment: Pt required min (A) to stand, however feel that she will progress quickly. Verbal cues for sequencing and reaching back with arms before sitting.    Balance Overall balance assessment: Needs assistance Sitting-balance support: No upper extremity supported;Feet supported Sitting balance-Leahy Scale: Fair     Standing balance support: Single extremity supported;During functional activity Standing balance-Leahy Scale:  Poor Standing balance comment: Pt required one person hand held assist for functional mobility and balance.                            ADL Overall ADL's : Needs assistance/impaired Eating/Feeding: Independent;Sitting   Grooming: Set up;Sitting   Upper Body Bathing: Supervision/ safety;Set up;Sitting   Lower Body Bathing: Minimal assistance;Sit to/from stand;Adhering to back precautions   Upper Body Dressing : Minimal assistance;Sitting (Min A due to assist for donning/doffing brace)   Lower Body Dressing: Sit to/from stand;Minimal assistance;Adhering to back precautions   Toilet Transfer: Minimal assistance;Stand-pivot;BSC Toilet Transfer Details (indicate cue type and reason): min (A) to power up to full standing Toileting- Clothing Manipulation and Hygiene: Minimal assistance;Adhering to back precautions;Sit to/from stand Toileting - Clothing Manipulation Details (indicate cue type and reason): educated pt on use of baby wipes and tongs to perform toilet hygiene while adhering to back precautions. Tub/ Shower Transfer: Tub transfer;Minimal assistance;Ambulation;Shower seat;Grab bars (On person hand held assist)   Functional mobility during ADLs: Minimal assistance (One person hand held assist) General ADL Comments: Pt performed transfer from [employer] recliner. Pt required assist to power up to standing and utilized one person hand held assist to take steps to the recliner. Educated pt on incorporating back precautions into ADLs, energy conservation, and fall prevention strategies.      Vision  Per pt report, no change from baseline.                   Perception Perception Perception Tested?: No   Praxis Praxis Praxis tested?: Within functional limits    Pertinent Vitals/Pain  Pt reports pain as 8/10 and notified RN. Repositioned pt in recliner for comfort.     Hand Dominance Right   Extremity/Trunk Assessment Upper Extremity Assessment Upper Extremity  Assessment: Overall WFL for tasks assessed   Lower Extremity Assessment Lower Extremity Assessment: Defer to PT evaluation   Cervical / Trunk Assessment Cervical / Trunk Assessment: Normal   Communication Communication Communication: No difficulties   Cognition Arousal/Alertness: Awake/alert Behavior During Therapy: WFL for tasks assessed/performed Overall Cognitive Status: Within Functional Limits for tasks assessed       Memory: Decreased recall of precautions                        Home Living Family/patient expects to be discharged to:: Private residence Living Arrangements: Other relatives (pt lives with brother and sister-in-law) Available Help at Discharge: Family;Available 24 hours/day Type of Home: House Home Access: Stairs to enter [employer] of Steps: 3 Entrance Stairs-Rails: None Home Layout: One level     Bathroom Shower/Tub: Tub/shower unit Shower/tub characteristics: Engineer, building services: Standard     Home Equipment: Shower seat;Bedside commode;Grab bars - tub/shower;Hand held shower head          Prior Functioning/Environment Level of Independence: Independent        Comments: Pt reports she was independent, however had difficulty with LB ADLs.    OT Diagnosis: Generalized weakness;Acute pain   OT Problem List: Decreased strength;Decreased range of motion;Decreased activity tolerance;Impaired balance (sitting and/or standing);Decreased safety awareness;Decreased knowledge of use of DME or AE;Decreased knowledge of precautions;Pain   OT Treatment/Interventions: Self-care/ADL training;Therapeutic exercise;Energy conservation;DME and/or AE instruction;Therapeutic activities;Patient/family education;Balance training    OT Goals(Current goals can be found in the care plan section) Acute Rehab OT Goals Patient Stated Goal: To return home OT Goal Formulation: With patient Time For Goal Achievement: 10/16/13 Potential to  Achieve Goals: Good ADL Goals Pt Will Perform Grooming: with supervision;standing Pt Will Perform Lower Body Bathing: with supervision;with adaptive equipment;sit to/from stand Pt Will Perform Lower Body Dressing: with supervision;with adaptive equipment;sit to/from stand Pt Will Transfer to Toilet: with supervision;ambulating;bedside commode Pt Will Perform Toileting - Clothing Manipulation and hygiene: with supervision;with adaptive equipment;sit to/from stand Pt Will Perform Tub/Shower Transfer: Tub transfer;with min guard assist;ambulating;shower seat;grab bars Additional ADL Goal #1: Pt will perform bed mobility using log roll technique with supervision to prepare for ADLs.   OT Frequency: Min 2X/week    End of Session Equipment Utilized During Treatment: Gait belt;Back brace Nurse Communication: Patient requests pain meds  Activity Tolerance: Patient tolerated treatment well Patient left: in chair;with call bell/phone within reach   Time: 1158-1225 OT Time Calculation (min): 27 min Charges:  OT General Charges $OT Visit: 1 Procedure OT Evaluation $Initial OT Evaluation Tier I: 1 Procedure OT Treatments $Self Care/Home Management : 8-22 mins  Rae Lips [phone] 10/09/2013, 1:29 PM

## 2013-10-09 NOTE — Progress Notes (Signed)
Pt nauseated this am, vomiting large amount of bile-colored emesis. IV Zofran given with relief, pt medicated for pain afterward. Pt now resting comfortably.

## 2013-10-09 NOTE — Progress Notes (Signed)
CARE MANAGEMENT NOTE 10/09/2013  Patient:  Emily Wheeler, Emily Wheeler   Account Number:  [id number]  Date Initiated:  10/09/2013  Documentation initiated by:  Jiles Crocker  Subjective/Objective Assessment:   ADMITTED FOR BACK SURGERY     Action/Plan:   CM FOLLOWING FOR DCP   Anticipated DC Date:  10/16/2013   Anticipated DC Plan:  AWAITING ON PT/OT EVALS FOR DISPOSITION NEEDS     DC Planning Services  CM consult          Status of service:  In process, will continue to follow Medicare Important Message given?  NA - LOS <3 / Initial given by admissions (If response is "NO", the following Medicare IM given date fields will be blank)  Per UR Regulation:  Reviewed for med. necessity/level of care/duration of stay Comments:  5/15/2015Abelino Derrick RN,BSN,MHA [phone]

## 2013-10-09 NOTE — Progress Notes (Signed)
Subjective: Patient reports "I'm numb in my thighs...my back hurts"  Objective: Vital signs in last 24 hours: Temp:  [97.9 F (36.6 C)-98.5 F (36.9 C)] 98 F (36.7 C) (05/15 0654) Pulse Rate:  [66-104] 80 (05/15 0654) Resp:  [12-27] 18 (05/15 0654) BP: (122-202)/(69-100) 151/69 mmHg (05/15 0654) SpO2:  [95 %-100 %] 100 % (05/15 0654) Weight:  [100.699 kg (222 lb)] 100.699 kg (222 lb) (05/14 1728)  Intake/Output from previous day: 05/14 0701 - 05/15 0700 In: 4443 [I.V.:3703; Blood:240; IV Piggyback:500] Out: 3635 [Urine:2300; Drains:435; Blood:900] Intake/Output this shift:    Alert, cooperative. Good strength BLE. Pt reports vomiting earlier, lumbar pain & bilat thigh numbness. Drsg intact, with small amount blood on chux from dislodged drain. Site withou erythema or swelling.  Lab Results: No results found for this basename: WBC, HGB, HCT, PLT,  in the last 72 hours BMET No results found for this basename: NA, K, CL, CO2, GLUCOSE, BUN, CREATININE, CALCIUM,  in the last 72 hours  Studies/Results: Dg Lumbar Spine 2-3 Views  10/08/2013   CLINICAL DATA:  L3 through S1 PLIF.  EXAM: LUMBAR SPINE - 2-3 VIEW  COMPARISON:  DG LUMBAR SPINE COMPLETE W/ BEND 6+V dated 09/07/2013  FINDINGS: Two fluoroscopic spot views of the lumbar spine submitted, radiologist was not present at time of image acquisition.  Intraoperative fluoroscopic spot views demonstrate L3 through S1 bilateral pedicle screw placement with L3-4 through L5-S1 interbody disc material with radio-opaque markers present.  IMPRESSION: Intraoperative fluoroscopic spot views demonstrate L3 through S1 bilateral pedicle screw placement with L3-4 through L5-S1 interbody disc material and radio-opaque markers .   Electronically Signed   By: Awilda Metro   On: 10/08/2013 14:12    Assessment/Plan: Improving   LOS: 1 day  Hemovac removed (no longer effective) & drsg patched. Reassured pt re: lumbar pain & thigh numbness. Will  mobilize in LSO with PT & monitor pain levels.   Arlys John Scot Shiraishi 10/09/2013, 9:32 AM

## 2013-10-09 NOTE — Clinical Social Work Note (Signed)
CSW received consult for possible SNF placement at time of discharge. CSW awaiting PT/OT evaluations for recommendations. CSW continuing to follow for discharge disposition.  Darlyn Chamber, LCSWA Clinical Social Worker [phone]

## 2013-10-09 NOTE — Significant Event (Signed)
Rapid Response Event Note  Overview:  Called by Rn for respiratory distress Time Called: 1248 Arrival Time: 1252 Event Type: Other (Comment)  Initial Focused Assessment:  Upon my arrival to patients room family and RN at bedside.  VS 179/99, Sat 100% on 2 lpm.  As per Rn patient got out of bed and was sitting in chair about to eat lunch when she suddenly developed respiratory distress.  S per patient used her inhaler 2 times at bedside with no relief.     Interventions:  Breath sounds diminished throughout.  Patient appears to be anxious, body is shaking and hyperventilating.  MD paged and updated.  Primary RN administered valium as per order.  Patient states she is feeling better now.  Rn to call if assistance needed   Event Summary:   at      at          [facility]

## 2013-10-09 NOTE — Progress Notes (Addendum)
RN received call from pt room stating that she "couldn't breathe". Pt used her own inhaler and self-administered 3 puffs Albuterol.  Upon entering the room, pt was shaking, diaphoretic, and tearful, taking deep breaths.  V/S taken, BP 179/99, HR 103, O2 sats 100 on 5L [place].  Rapid RN and MD paged, instructed to give 5 mg Valium for anxiety.  Pt now resting, visibly in less distress and verbalized that she feels much better and that the meds "are working", although she is having some new numbness in her lips (MD aware). Call light within reach and pt educated about when to call RN.  Will continue to monitor closely.

## 2013-10-09 NOTE — Progress Notes (Signed)
Patient progressing well on POD 1.  Will mobilize with PT.

## 2013-10-10 LAB — GLUCOSE, CAPILLARY
Glucose-Capillary: 234 mg/dL — ABNORMAL HIGH (ref 70–99)
Glucose-Capillary: 82 mg/dL (ref 70–99)
Glucose-Capillary: 142 mg/dL — ABNORMAL HIGH (ref 70–99)
Glucose-Capillary: 90 mg/dL (ref 70–99)

## 2013-10-10 MED ORDER — PANTOPRAZOLE SODIUM 40 MG PO TBEC
40.0000 mg | DELAYED_RELEASE_TABLET | Freq: Every day | ORAL | Status: DC
  Administered 2013-10-10 – 2013-10-11 (×2): 40 mg via ORAL
  Filled 2013-10-10 (×2): qty 1

## 2013-10-10 NOTE — Progress Notes (Signed)
Occupational Therapy Treatment Patient Details Name: Emily Wheeler MRN: [id number] DOB: [birth year] Today's Date: 10/10/2013    History of present illness Pt is 58 y.o. Female s/p PLIF 3 level for spondylolisthesis of lumbar region on 10/08/13.   OT comments   Still requiring assistance for sit/stand with cues for tech. And maintaining back precautions.  States RW difficult secondary to hx of RA with reports that RUE is greatly affected.  Able to complete ub/lb dressing and all aspects of toileting.  Reports sister (who was present for today's session-joan) will be able to assist at home.    Follow Up Recommendations  No OT follow up;Supervision/Assistance - 24 hour    Equipment Recommendations  None recommended by OT          Precautions / Restrictions Precautions Precautions: Back Precaution Comments: Educated pt on 3/3 back precautions and incorporating into ADLs. Required Braces or Orthoses: Spinal Brace Spinal Brace: Lumbar corset;Applied in sitting position       Mobility Bed Mobility Overal bed mobility: Needs Assistance Bed Mobility: Rolling;Sidelying to Sit Rolling: Min assist Sidelying to sit: Min assist       General bed mobility comments: cues for log rolling tech. and assistance for guiding trunk while transitioning into sitting eob  Transfers Overall transfer level: Needs assistance Equipment used: Rolling walker (2 wheeled) Transfers: Sit to/from Stand Sit to Stand: Min assist         General transfer comment: cues for hand placement, sequencing/safe technique;  mild lifting assist                                       ADL Overall ADL's : Needs assistance/impaired     Grooming: Wash/dry hands;Wash/dry face;Applying deodorant;Minimal assistance;Set up;Sitting Grooming Details (indicate cue type and reason): cues for maintaining back precautions Upper Body Bathing: Min guard;Sitting Upper Body Bathing Details (indicate cue type  and reason): cues for maintaining back precautions     Upper Body Dressing : Minimal assistance;Sitting Upper Body Dressing Details (indicate cue type and reason): cues for maintaining back precautions Lower Body Dressing: Minimal assistance;Cueing for back precautions;Sitting/lateral leans Lower Body Dressing Details (indicate cue type and reason): able to pull b les onto bed one at a time to reach feet, states she has always done it that way secondary to reported pre-existing arthritis issues Toilet Transfer: Min guard;Ambulation;Comfort height toilet;Grab bars Toilet Transfer Details (indicate cue type and reason): cues not to twist while cleaning peri areas Toileting- Clothing Manipulation and Hygiene: Min guard;Cueing for back precautions;Sitting/lateral lean       Functional mobility during ADLs: Minimal assistance General ADL Comments: reports she will have assistance from her sister at d/c.  has reported hx of RA so states she has been implementing compensatory tech. for years now.  able to perform LB dressing while seated by pulling b les up on bed.  donned brace and gown with setup/cga.  demonstrational cues to state 3/3 precautions.  intermittent cues during toileting to maintain precautions.  Pertinent Vitals/ Pain       Pt. Did not rate pain but did moan initially with sit/stand                                                          Frequency Min 2X/week     Progress Toward Goals  OT Goals(current goals can now be found in the care plan section)  Progress towards OT goals: Progressing toward goals     Plan Discharge plan remains appropriate                     End of Session Equipment Utilized During Treatment: Back brace;Rolling walker   Activity Tolerance Patient tolerated treatment well    Patient Left in chair;with call bell/phone within reach;with family/visitor present             Time: 1950-9326 OT Time Calculation (min): 36 min  Charges: OT General Charges $OT Visit: 1 Procedure OT Treatments $Self Care/Home Management : 23-37 mins  Earvin Hansen Keerthana Vanrossum, COTA/L 10/10/2013, 1:56 PM

## 2013-10-10 NOTE — Progress Notes (Signed)
Physical Therapy Treatment Patient Details Name: Emily Wheeler MRN: [id number] DOB: 1956/01/21 Today's Date: 2013-11-07    History of Present Illness Pt is 58 y.o. Female s/p PLIF 3 level for spondylolisthesis of lumbar region on 10/08/13.    PT Comments    Pt making steady progress toward goals.  Follow Up Recommendations  Other (comment) (likley will not need follow up)     Equipment Recommendations  None recommended by PT    Precautions / Restrictions Precautions Precautions: Back Precaution Comments: Reinforced education on 3/3 back precautions throughout session Required Braces or Orthoses: Spinal Brace Spinal Brace: Lumbar corset;Applied in sitting position Restrictions Weight Bearing Restrictions: No    Mobility  Bed Mobility Overal bed mobility: Needs Assistance Bed Mobility: Rolling;Sidelying to Sit Rolling: Min assist Sidelying to sit: Min assist       General bed mobility comments: out of bed in chair before and after session  Transfers Overall transfer level: Needs assistance Equipment used: Rolling walker (2 wheeled) Transfers: Sit to/from Stand Sit to Stand: Min assist         General transfer comment: cues for hand placement, anterior weight shift and to power through legs with standing. minimal assist needed to complete the stand. min guard assist with sitting back down with cues on technique.  Ambulation/Gait Ambulation/Gait assistance: Min guard Ambulation Distance (Feet): 150 Feet Assistive device: Rolling walker (2 wheeled) Gait Pattern/deviations: Step-through pattern;Decreased stride length Gait velocity: decreased Gait velocity interpretation: Below normal speed for age/gender General Gait Details: cues on posture and to decr UE reliance on walker with gait.        Cognition Arousal/Alertness: Awake/alert Behavior During Therapy: WFL for tasks assessed/performed Overall Cognitive Status: Within Functional Limits for tasks  assessed                 PT Goals (current goals can now be found in the care plan section) Acute Rehab PT Goals Patient Stated Goal: To return home PT Goal Formulation: With patient Time For Goal Achievement: 10/16/13 Potential to Achieve Goals: Good Progress towards PT goals: Progressing toward goals    Frequency  Min 5X/week    PT Plan Current plan remains appropriate    End of Session Equipment Utilized During Treatment: Gait belt;Back brace Activity Tolerance: Patient tolerated treatment well Patient left: in chair;with family/visitor present;with call bell/phone within reach     Time: 1025-8527 PT Time Calculation (min): 16 min  Charges:  $Gait Training: 8-22 mins                    G Codes:      Sallyanne Kuster 11/07/2013, 3:23 PM  Sallyanne Kuster, PTA Office- [phone]

## 2013-10-10 NOTE — Progress Notes (Signed)
Subjective: Patient reports numbness in both thighs  Objective: Vital signs in last 24 hours: Temp:  [97.3 F (36.3 C)-98.9 F (37.2 C)] 98 F (36.7 C) (05/16 1008) Pulse Rate:  [88-103] 98 (05/16 1008) Resp:  [18-20] 20 (05/16 1008) BP: (98-179)/(52-99) 132/52 mmHg (05/16 1008) SpO2:  [99 %-100 %] 99 % (05/16 1008)  Intake/Output from previous day: 05/15 0701 - 05/16 0700 In: 940 [P.O.:940] Out: 1350 [Urine:1350] Intake/Output this shift: Total I/O In: 120 [P.O.:120] Out: -   Physical Exam: Full strength both lower extremities.  Dressing CDI.  C/o numbness in both thighs to touch.  Lab Results: No results found for this basename: WBC, HGB, HCT, PLT,  in the last 72 hours BMET No results found for this basename: NA, K, CL, CO2, GLUCOSE, BUN, CREATININE, CALCIUM,  in the last 72 hours  Studies/Results: Dg Lumbar Spine 2-3 Views  10/08/2013   CLINICAL DATA:  L3 through S1 PLIF.  EXAM: LUMBAR SPINE - 2-3 VIEW  COMPARISON:  DG LUMBAR SPINE COMPLETE W/ BEND 6+V dated 09/07/2013  FINDINGS: Two fluoroscopic spot views of the lumbar spine submitted, radiologist was not present at time of image acquisition.  Intraoperative fluoroscopic spot views demonstrate L3 through S1 bilateral pedicle screw placement with L3-4 through L5-S1 interbody disc material with radio-opaque markers present.  IMPRESSION: Intraoperative fluoroscopic spot views demonstrate L3 through S1 bilateral pedicle screw placement with L3-4 through L5-S1 interbody disc material and radio-opaque markers .   Electronically Signed   By: Awilda Metro   On: 10/08/2013 14:12    Assessment/Plan: Improving, continue to mobilize, valium for muscle spasms and anxiety.    LOS: 2 days    Maeola Harman, MD 10/10/2013, 10:46 AM

## 2013-10-11 LAB — GLUCOSE, CAPILLARY
GLUCOSE-CAPILLARY: 116 mg/dL — AB (ref 70–99)
GLUCOSE-CAPILLARY: 137 mg/dL — AB (ref 70–99)
GLUCOSE-CAPILLARY: 147 mg/dL — AB (ref 70–99)
Glucose-Capillary: 112 mg/dL — ABNORMAL HIGH (ref 70–99)

## 2013-10-11 NOTE — Progress Notes (Signed)
Subjective: Patient reports sore in back  Objective: Vital signs in last 24 hours: Temp:  [98 F (36.7 C)-99.6 F (37.6 C)] 98 F (36.7 C) (05/17 1003) Pulse Rate:  [76-92] 92 (05/17 1003) Resp:  [20] 20 (05/17 1003) BP: (115-144)/(59-78) 140/68 mmHg (05/17 1003) SpO2:  [100 %] 100 % (05/17 1003)  Intake/Output from previous day: 05/16 0701 - 05/17 0700 In: 960 [P.O.:960] Out: 550 [Urine:550] Intake/Output this shift:    Physical Exam: Strength full, still c/o numbness both thighs.  Dressing CDI  Lab Results: No results found for this basename: WBC, HGB, HCT, PLT,  in the last 72 hours BMET No results found for this basename: NA, K, CL, CO2, GLUCOSE, BUN, CREATININE, CALCIUM,  in the last 72 hours  Studies/Results: No results found.  Assessment/Plan: Mobilizing with PT.  Still with significant back pain, but leg pain much improved.    LOS: 3 days    Maeola Harman, MD 10/11/2013, 11:50 AM

## 2013-10-12 LAB — GLUCOSE, CAPILLARY
GLUCOSE-CAPILLARY: 119 mg/dL — AB (ref 70–99)
GLUCOSE-CAPILLARY: 107 mg/dL — AB (ref 70–99)

## 2013-10-12 MED FILL — Heparin Sodium (Porcine) Inj 1000 Unit/ML: INTRAMUSCULAR | Qty: 30 | Status: AC

## 2013-10-12 MED FILL — Sodium Chloride IV Soln 0.9%: INTRAVENOUS | Qty: 3000 | Status: AC

## 2013-10-12 NOTE — Discharge Summary (Signed)
Physician Discharge Summary  Patient ID: Emily Wheeler MRN: [id number] DOB/AGE: [birth year] 58 y.o.  Admit date: 10/08/2013 Discharge date: 10/12/2013  Admission Diagnoses: Spondylolisthesis, Lumbar stenosis, Lumbar spondylosis, Lumbar radiculopathy, Rheumatoid arthritis L 34, L 45, L 5 S1 levels   Discharge Diagnoses: Spondylolisthesis, Lumbar stenosis, Lumbar spondylosis, Lumbar radiculopathy, Rheumatoid arthritis L 34, L 45, L 5 S1 levels s/p FOR MAXIMUM ACCESS (MAS) POSTERIOR LUMBAR INTERBODY FUSION (PLIF) 3 LEVEL (N/A) - L3-4 L4-5 L5-S1 maximum access posterior lumbar interbody fusion with decompression Decompression greater than typical for standard posterior decompression and fusion at each level  Active Problems:   Spondylolisthesis of lumbar region   Discharged Condition: good  Hospital Course: Emily Wheeler was admitted for surgery with Dx spondylolisthesis, stenosis, and radiculopathy. Following uncomplicated MAS PLIF L3-S1, she recovered and transferred to [facility] for nursing care and therapies. She has progressed steadily, with one apparent panic attack episode.    Consults: None  Significant Diagnostic Studies: radiology: X-Ray: intra-operative  Treatments: surgery: FOR MAXIMUM ACCESS (MAS) POSTERIOR LUMBAR INTERBODY FUSION (PLIF) 3 LEVEL (N/A) - L3-4 L4-5 L5-S1 maximum access posterior lumbar interbody fusion with decompression Decompression greater than typical for standard posterior decompression and fusion at each level   Discharge Exam: Blood pressure 120/73, pulse 78, temperature 98.8 F (37.1 C), temperature source Oral, resp. rate 20, height 5\' 4"  (1.626 m), weight 100.699 kg (222 lb), last menstrual period 05/29/1991, SpO2 100.00%. Alert, smiling, eating breakfast. Good strength BLE. Incision without erythema, swelling, or drainage. Pt reports buttock pain/spasms this am, but lumbar pain & leg pain absent this am   Disposition: Discharge to home. Reassured re:  muscle spasms buttocks, back.   Pt verbalizes understanding of d/c instructions. Rx's for Percocet & Valium. Pt will call office to schedule 3-4 week f/u with DrStern. She will contact DrJones, her primary care MD for f/u of panic attacks and any future prescriptions r/t anxiety.      Medication List    ASK your doctor about these medications       albuterol 108 (90 BASE) MCG/ACT inhaler  Commonly known as:  PROAIR HFA  Inhale 1-2 puffs into the lungs every 6 (six) hours as needed for wheezing or shortness of breath.     atenolol 100 MG tablet  Commonly known as:  TENORMIN  Take 1 tablet (100 mg total) by mouth daily.     Chromium-Cinnamon [phone] MCG-MG Caps  Take 1 tablet by mouth daily.     citalopram 20 MG tablet  Commonly known as:  CELEXA  Take 1 tablet (20 mg total) by mouth daily.     Fish Oil 1200 MG Caps  Take 1,200 mg by mouth daily.     gabapentin 600 MG tablet  Commonly known as:  NEURONTIN  Take 600 mg by mouth 3 (three) times daily.     hydrochlorothiazide 25 MG tablet  Commonly known as:  HYDRODIURIL  Take 1 tablet (25 mg total) by mouth daily.     HYDROcodone-acetaminophen 5-325 MG per tablet  Commonly known as:  NORCO/VICODIN  Take 1 tablet by mouth every 6 (six) hours as needed (pain).     leflunomide 20 MG tablet  Commonly known as:  ARAVA  Take 1 tablet (20 mg total) by mouth daily.     lisinopril 20 MG tablet  Commonly known as:  PRINIVIL,ZESTRIL  Take 1 tablet (20 mg total) by mouth daily.     metFORMIN 500 MG tablet  Commonly known as:  GLUCOPHAGE  Take 1 tablet (500 mg total) by mouth 2 (two) times daily with a meal.     multivitamin tablet  Take 1 tablet by mouth daily.     NIFEdipine 30 MG 24 hr tablet  Commonly known as:  PROCARDIA-XL/ADALAT-CC/NIFEDICAL-XL  Take 1 tablet (30 mg total) by mouth daily.     potassium chloride SA 20 MEQ tablet  Commonly known as:  K-DUR,KLOR-CON  Take 1 tablet (20 mEq total) by mouth 3 (three)  times daily.     REMICADE 100 MG injection  Generic drug:  inFLIXimab  Inject 300 mg into the vein every 8 (eight) weeks.     rosuvastatin 20 MG tablet  Commonly known as:  CRESTOR  Take 1 tablet (20 mg total) by mouth daily.         SignedGeorgiann Cocker 10/12/2013, 8:29 AM

## 2013-10-12 NOTE — Progress Notes (Signed)
Subjective: Patient reports "I'm doing fine! I hurt some in my butt today, but my legs are fine"  Objective: Vital signs in last 24 hours: Temp:  [97.9 F (36.6 C)-99 F (37.2 C)] 98.8 F (37.1 C) (05/18 0526) Pulse Rate:  [70-92] 78 (05/18 0526) Resp:  [18-22] 20 (05/18 0526) BP: (120-160)/(64-88) 120/73 mmHg (05/18 0526) SpO2:  [98 %-100 %] 100 % (05/18 0526)  Intake/Output from previous day: 05/17 0701 - 05/18 0700 In: 240 [P.O.:240] Out: -  Intake/Output this shift:    Alert, smiling, eating breakfast. Good strength BLE. Incision without erythema, swelling, or drainage. Pt reports buttock pain/spasms this am, but lumbar pain & leg pain absent this am.   Lab Results: No results found for this basename: WBC, HGB, HCT, PLT,  in the last 72 hours BMET No results found for this basename: NA, K, CL, CO2, GLUCOSE, BUN, CREATININE, CALCIUM,  in the last 72 hours  Studies/Results: No results found.  Assessment/Plan: Improved   LOS: 4 days  Reassured re: muscle spasms buttocks, back.  Per DrStern, d/c IV, d/c to home.  Pt verbalizes understanding of d/c instructions. Rx's for Percocet & Valium. Pt will call offie to schedule 3-4 week f/u with DrStern. She will contact DrJones, her primary care MD for f/u of panic attacks and any future prescriptions r/t anxiety.   Arlys John Shannon Balthazar 10/12/2013, 8:21 AM

## 2013-10-12 NOTE — Progress Notes (Signed)
OT Cancellation Note  Patient Details Name: Emily Wheeler MRN: [id number] DOB: [birth year]   Cancelled Treatment:    Reason Eval/Treat Not Completed: Other (comment).  Pt states she has no OT concerns at this time and is discharging today. Pt has assistance at home.  Earlie Raveling OTR/L [phone] 10/12/2013, 8:58 AM

## 2013-10-13 ENCOUNTER — Encounter (HOSPITAL_COMMUNITY): Payer: Self-pay | Admitting: Neurosurgery

## 2013-11-02 ENCOUNTER — Other Ambulatory Visit: Payer: Self-pay

## 2013-11-02 MED ORDER — GABAPENTIN 600 MG PO TABS: 600.0000 mg | ORAL_TABLET | Freq: Three times a day (TID) | ORAL | Status: DC

## 2014-01-27 ENCOUNTER — Other Ambulatory Visit: Payer: Self-pay | Admitting: Internal Medicine

## 2014-01-27 DIAGNOSIS — M25559 Pain in unspecified hip: Secondary | ICD-10-CM | POA: Diagnosis not present

## 2014-01-27 DIAGNOSIS — M069 Rheumatoid arthritis, unspecified: Secondary | ICD-10-CM | POA: Diagnosis not present

## 2014-01-27 DIAGNOSIS — M1710 Unilateral primary osteoarthritis, unspecified knee: Secondary | ICD-10-CM | POA: Diagnosis not present

## 2014-01-27 DIAGNOSIS — IMO0002 Reserved for concepts with insufficient information to code with codable children: Secondary | ICD-10-CM | POA: Diagnosis not present

## 2014-01-27 DIAGNOSIS — M25569 Pain in unspecified knee: Secondary | ICD-10-CM | POA: Diagnosis not present

## 2014-02-08 ENCOUNTER — Ambulatory Visit (INDEPENDENT_AMBULATORY_CARE_PROVIDER_SITE_OTHER): Payer: Medicare Other | Admitting: Internal Medicine

## 2014-02-08 ENCOUNTER — Encounter: Payer: Self-pay | Admitting: Internal Medicine

## 2014-02-08 ENCOUNTER — Other Ambulatory Visit (INDEPENDENT_AMBULATORY_CARE_PROVIDER_SITE_OTHER): Payer: Medicare Other

## 2014-02-08 VITALS — BP 162/94 | HR 62 | Temp 98.1°F | Resp 16 | Wt 214.1 lb

## 2014-02-08 DIAGNOSIS — E876 Hypokalemia: Secondary | ICD-10-CM | POA: Diagnosis not present

## 2014-02-08 DIAGNOSIS — I10 Essential (primary) hypertension: Secondary | ICD-10-CM | POA: Diagnosis not present

## 2014-02-08 DIAGNOSIS — D539 Nutritional anemia, unspecified: Secondary | ICD-10-CM

## 2014-02-08 DIAGNOSIS — E785 Hyperlipidemia, unspecified: Secondary | ICD-10-CM | POA: Diagnosis not present

## 2014-02-08 DIAGNOSIS — Z23 Encounter for immunization: Secondary | ICD-10-CM

## 2014-02-08 DIAGNOSIS — IMO0001 Reserved for inherently not codable concepts without codable children: Secondary | ICD-10-CM

## 2014-02-08 DIAGNOSIS — E1165 Type 2 diabetes mellitus with hyperglycemia: Principal | ICD-10-CM

## 2014-02-08 DIAGNOSIS — Z86010 Personal history of colonic polyps: Secondary | ICD-10-CM

## 2014-02-08 DIAGNOSIS — D510 Vitamin B12 deficiency anemia due to intrinsic factor deficiency: Secondary | ICD-10-CM | POA: Insufficient documentation

## 2014-02-08 LAB — HEMOGLOBIN A1C: Hgb A1c MFr Bld: 7.8 % — ABNORMAL HIGH (ref 4.6–6.5)

## 2014-02-08 LAB — COMPREHENSIVE METABOLIC PANEL
ALBUMIN: 3.6 g/dL (ref 3.5–5.2)
ALT: 18 U/L (ref 0–35)
AST: 18 U/L (ref 0–37)
Alkaline Phosphatase: 91 U/L (ref 39–117)
BUN: 16 mg/dL (ref 6–23)
CALCIUM: 8.5 mg/dL (ref 8.4–10.5)
CHLORIDE: 104 meq/L (ref 96–112)
CO2: 25 meq/L (ref 19–32)
Creatinine, Ser: 0.9 mg/dL (ref 0.4–1.2)
GFR: 81.11 mL/min (ref >60.00)
Glucose, Bld: 138 mg/dL — ABNORMAL HIGH (ref 70–99)
POTASSIUM: 3.7 meq/L (ref 3.5–5.1)
Sodium: 139 mEq/L (ref 135–145)
Total Bilirubin: 0.6 mg/dL (ref 0.2–1.2)
Total Protein: 7.1 g/dL (ref 6.0–8.3)

## 2014-02-08 LAB — RETICULOCYTES
ABS RETIC: 38.8 10*3/uL (ref 19.0–186.0)
RBC.: 3.88 MIL/uL (ref 3.87–5.11)
RETIC CT PCT: 1 % (ref 0.4–2.3)

## 2014-02-08 LAB — CBC WITH DIFFERENTIAL/PLATELET
Basophils Absolute: 0 10*3/uL (ref 0.0–0.1)
Basophils Relative: 0.4 % (ref 0.0–3.0)
EOS ABS: 0.3 10*3/uL (ref 0.0–0.7)
Eosinophils Relative: 4.4 % (ref 0.0–5.0)
HCT: 35.1 % — ABNORMAL LOW (ref 36.0–46.0)
HEMOGLOBIN: 11.4 g/dL — AB (ref 12.0–15.0)
LYMPHS PCT: 43.5 % (ref 12.0–46.0)
Lymphs Abs: 2.6 10*3/uL (ref 0.7–4.0)
MCHC: 32.6 g/dL (ref 30.0–36.0)
MCV: 90.4 fl (ref 78.0–100.0)
MONOS PCT: 9.9 % (ref 3.0–12.0)
Monocytes Absolute: 0.6 10*3/uL (ref 0.1–1.0)
NEUTROS ABS: 2.5 10*3/uL (ref 1.4–7.7)
NEUTROS PCT: 41.8 % — AB (ref 43.0–77.0)
PLATELETS: 245 10*3/uL (ref 150.0–400.0)
RBC: 3.88 Mil/uL (ref 3.87–5.11)
RDW: 16.4 % — AB (ref 11.5–15.5)
WBC: 6 10*3/uL (ref 4.0–10.5)

## 2014-02-08 LAB — LIPID PANEL
Cholesterol: 145 mg/dL (ref 0–200)
HDL: 36.6 mg/dL — AB (ref >39.00)
LDL Cholesterol: 72 mg/dL (ref 0–99)
NONHDL: 108.4
Total CHOL/HDL Ratio: 4
Triglycerides: 183 mg/dL — ABNORMAL HIGH (ref 0.0–149.0)
VLDL: 36.6 mg/dL (ref 0.0–40.0)

## 2014-02-08 LAB — IBC PANEL
Iron: 52 ug/dL (ref 42–145)
Saturation Ratios: 13.8 % — ABNORMAL LOW (ref 20.0–50.0)
Transferrin: 269.3 mg/dL (ref 212.0–360.0)

## 2014-02-08 LAB — FERRITIN: Ferritin: 24.9 ng/mL (ref 10.0–291.0)

## 2014-02-08 LAB — TSH: TSH: 0.71 u[IU]/mL (ref 0.35–4.50)

## 2014-02-08 MED ORDER — NEBIVOLOL HCL 5 MG PO TABS: 5.0000 mg | ORAL_TABLET | Freq: Every day | ORAL | Status: DC

## 2014-02-08 MED ORDER — ROSUVASTATIN CALCIUM 20 MG PO TABS: 20.0000 mg | ORAL_TABLET | Freq: Every day | ORAL | Status: DC

## 2014-02-08 NOTE — Patient Instructions (Signed)

## 2014-02-08 NOTE — Assessment & Plan Note (Signed)
She is due for a follow up colonoscopy

## 2014-02-08 NOTE — Assessment & Plan Note (Signed)
She is doing well con crestor Will recheck her FLP today

## 2014-02-08 NOTE — Progress Notes (Signed)
   Subjective:    Patient ID: Emily Wheeler, female    DOB: Oct 20, 1955, 58 y.o.   MRN: [id number]  Hypertension This is a chronic problem. The current episode started more than 1 year ago. The problem is unchanged. The problem is uncontrolled. Pertinent negatives include no anxiety, blurred vision, chest pain, headaches, malaise/fatigue, neck pain, orthopnea, palpitations, peripheral edema, PND, shortness of breath or sweats. There are no associated agents to hypertension. Past treatments include calcium channel blockers, beta blockers, diuretics and ACE inhibitors. The current treatment provides moderate improvement. Compliance problems include diet and exercise.       Review of Systems  Constitutional: Negative.  Negative for fever, chills, malaise/fatigue, diaphoresis, appetite change and fatigue.  HENT: Negative.   Eyes: Negative.  Negative for blurred vision.  Respiratory: Negative.  Negative for cough, choking, chest tightness, shortness of breath and stridor.   Cardiovascular: Negative.  Negative for chest pain, palpitations, orthopnea, leg swelling and PND.  Gastrointestinal: Positive for blood in stool and anal bleeding. Negative for nausea, vomiting, abdominal pain, diarrhea, constipation, abdominal distention and rectal pain.  Endocrine: Negative.   Genitourinary: Negative.   Musculoskeletal: Negative.  Negative for arthralgias, myalgias and neck pain.  Skin: Negative.  Negative for rash.  Allergic/Immunologic: Negative.   Neurological: Negative.  Negative for dizziness, syncope, facial asymmetry, speech difficulty, light-headedness, numbness and headaches.  Hematological: Negative.  Negative for adenopathy. Does not bruise/bleed easily.  Psychiatric/Behavioral: Negative.        Objective:   Physical Exam  Vitals reviewed. Constitutional: She is oriented to person, place, and time. She appears well-developed and well-nourished. No distress.  HENT:  Head: Normocephalic  and atraumatic.  Mouth/Throat: No oropharyngeal exudate.  Eyes: Conjunctivae are normal. Right eye exhibits no discharge. Left eye exhibits no discharge. No scleral icterus.  Neck: Normal range of motion. Neck supple. No JVD present. No tracheal deviation present. No thyromegaly present.  Cardiovascular: Normal rate, regular rhythm, normal heart sounds and intact distal pulses.  Exam reveals no gallop and no friction rub.   No murmur heard. Pulmonary/Chest: Effort normal and breath sounds normal. No stridor. No respiratory distress. She has no wheezes. She has no rales. She exhibits no tenderness.  Abdominal: Soft. Bowel sounds are normal. She exhibits no distension and no mass. There is no tenderness. There is no rebound and no guarding.  Musculoskeletal: Normal range of motion. She exhibits no edema and no tenderness.  Lymphadenopathy:    She has no cervical adenopathy.  Neurological: She is oriented to person, place, and time.  Skin: Skin is warm and dry. No rash noted. She is not diaphoretic. No erythema. No pallor.     Lab Results  Component Value Date   WBC 6.0 09/30/2013   HGB 11.5* 09/30/2013   HCT 34.6* 09/30/2013   PLT 237 09/30/2013   GLUCOSE 161* 09/30/2013   CHOL 245* 01/30/2013   TRIG 197.0* 01/30/2013   HDL 47.60 01/30/2013   LDLDIRECT 162.5 01/30/2013   ALT 23 01/30/2013   AST 22 01/30/2013   NA 144 09/30/2013   K 4.0 09/30/2013   CL 105 09/30/2013   CREATININE 0.83 09/30/2013   BUN 14 09/30/2013   CO2 25 09/30/2013   TSH 1.69 01/30/2013   HGBA1C 7.1* 10/08/2013   MICROALBUR 3.0* 01/30/2013       Assessment & Plan:

## 2014-02-08 NOTE — Assessment & Plan Note (Signed)
Her BP is not well controlled I think bystolic would be a more effective Beta-blocker than atenolol for BP control so will make that change She will cont all the other agents as directed I will monitor her lytes and renal function today

## 2014-02-08 NOTE — Assessment & Plan Note (Signed)
The software blocked my order for a B12 and folate level Will recheck her CBC and iron levels

## 2014-02-08 NOTE — Assessment & Plan Note (Signed)
I will recheck her A1C and will address if needed Will also monitor her renal function 

## 2014-02-08 NOTE — Progress Notes (Signed)
Pre visit review using our clinic review tool, if applicable. No additional management support is needed unless otherwise documented below in the visit note. 

## 2014-02-09 ENCOUNTER — Encounter: Payer: Self-pay | Admitting: Internal Medicine

## 2014-02-10 ENCOUNTER — Other Ambulatory Visit (HOSPITAL_COMMUNITY): Payer: Self-pay | Admitting: *Deleted

## 2014-02-11 ENCOUNTER — Encounter (HOSPITAL_COMMUNITY)
Admission: RE | Admit: 2014-02-11 | Discharge: 2014-02-11 | Disposition: A | Discharge status: Home / Self Care | Payer: Medicare Other | Source: Ambulatory Visit | Attending: Rheumatology | Admitting: Rheumatology

## 2014-02-11 DIAGNOSIS — M069 Rheumatoid arthritis, unspecified: Secondary | ICD-10-CM | POA: Diagnosis not present

## 2014-02-11 MED ORDER — ACETAMINOPHEN 325 MG PO TABS
ORAL_TABLET | ORAL | Status: AC
  Administered 2014-02-11: 650 mg via ORAL
  Filled 2014-02-11: qty 2

## 2014-02-11 MED ORDER — SODIUM CHLORIDE 0.9 % IV SOLN
3.0000 mg/kg | INTRAVENOUS | Status: DC
  Administered 2014-02-11: 300 mg via INTRAVENOUS
  Filled 2014-02-11: qty 30

## 2014-02-11 MED ORDER — ACETAMINOPHEN 325 MG PO TABS
650.0000 mg | ORAL_TABLET | Freq: Four times a day (QID) | ORAL | Status: DC | PRN
  Administered 2014-02-11: 650 mg via ORAL

## 2014-02-11 MED ORDER — SODIUM CHLORIDE 0.9 % IV SOLN
INTRAVENOUS | Status: DC
  Administered 2014-02-11: 13:00:00 via INTRAVENOUS

## 2014-02-25 ENCOUNTER — Other Ambulatory Visit: Payer: Self-pay | Admitting: Internal Medicine

## 2014-03-05 ENCOUNTER — Other Ambulatory Visit: Payer: Self-pay | Admitting: Internal Medicine

## 2014-03-05 DIAGNOSIS — N63 Unspecified lump in unspecified breast: Secondary | ICD-10-CM

## 2014-03-18 ENCOUNTER — Encounter: Payer: Self-pay | Admitting: Gastroenterology

## 2014-03-26 ENCOUNTER — Other Ambulatory Visit: Payer: Self-pay | Admitting: Internal Medicine

## 2014-03-30 ENCOUNTER — Telehealth: Payer: Self-pay | Admitting: *Deleted

## 2014-03-30 DIAGNOSIS — I10 Essential (primary) hypertension: Secondary | ICD-10-CM

## 2014-03-30 NOTE — Telephone Encounter (Signed)
ok 

## 2014-03-30 NOTE — Telephone Encounter (Signed)
Left msg on triage wanting to get handi-cap form, also want to know does md have some bystolic...Raechel Chute

## 2014-03-31 MED ORDER — NEBIVOLOL HCL 5 MG PO TABS: 5.0000 mg | ORAL_TABLET | Freq: Every day | ORAL | Status: DC

## 2014-03-31 NOTE — Telephone Encounter (Signed)
Notified pt form ready for pick-up../lmb 

## 2014-03-31 NOTE — Telephone Encounter (Signed)
Completed handi-capp form place on md desk for signature...Raechel Chute

## 2014-04-05 ENCOUNTER — Other Ambulatory Visit: Payer: Self-pay | Admitting: Internal Medicine

## 2014-04-08 ENCOUNTER — Ambulatory Visit (HOSPITAL_COMMUNITY)
Admission: RE | Admit: 2014-04-08 | Discharge: 2014-04-08 | Disposition: A | Discharge status: Home / Self Care | Payer: Medicare Other | Source: Ambulatory Visit | Attending: Rheumatology | Admitting: Rheumatology

## 2014-04-08 DIAGNOSIS — M069 Rheumatoid arthritis, unspecified: Secondary | ICD-10-CM | POA: Insufficient documentation

## 2014-04-08 MED ORDER — SODIUM CHLORIDE 0.9 % IV SOLN
3.0000 mg/kg | INTRAVENOUS | Status: DC
  Administered 2014-04-08: 300 mg via INTRAVENOUS
  Filled 2014-04-08: qty 30

## 2014-04-08 MED ORDER — SODIUM CHLORIDE 0.9 % IV SOLN
INTRAVENOUS | Status: DC
  Administered 2014-04-08: 10:00:00 via INTRAVENOUS

## 2014-04-08 MED ORDER — ACETAMINOPHEN 325 MG PO TABS
650.0000 mg | ORAL_TABLET | Freq: Four times a day (QID) | ORAL | Status: DC | PRN
  Administered 2014-04-08: 650 mg via ORAL

## 2014-04-08 MED ORDER — ACETAMINOPHEN 325 MG PO TABS
ORAL_TABLET | ORAL | Status: AC
  Filled 2014-04-08: qty 2

## 2014-05-06 ENCOUNTER — Other Ambulatory Visit: Payer: Medicare Other

## 2014-05-07 ENCOUNTER — Other Ambulatory Visit: Payer: Self-pay | Admitting: Internal Medicine

## 2014-05-10 DIAGNOSIS — M4316 Spondylolisthesis, lumbar region: Secondary | ICD-10-CM | POA: Diagnosis not present

## 2014-05-10 DIAGNOSIS — M5416 Radiculopathy, lumbar region: Secondary | ICD-10-CM | POA: Diagnosis not present

## 2014-05-10 DIAGNOSIS — Z6837 Body mass index (BMI) 37.0-37.9, adult: Secondary | ICD-10-CM | POA: Diagnosis not present

## 2014-05-10 DIAGNOSIS — I10 Essential (primary) hypertension: Secondary | ICD-10-CM | POA: Diagnosis not present

## 2014-05-10 DIAGNOSIS — M545 Low back pain: Secondary | ICD-10-CM | POA: Diagnosis not present

## 2014-05-12 ENCOUNTER — Telehealth: Payer: Self-pay | Admitting: Internal Medicine

## 2014-05-12 DIAGNOSIS — I10 Essential (primary) hypertension: Secondary | ICD-10-CM

## 2014-05-12 MED ORDER — CARVEDILOL 6.25 MG PO TABS: 6.2500 mg | ORAL_TABLET | Freq: Two times a day (BID) | ORAL | Status: DC

## 2014-05-12 NOTE — Telephone Encounter (Signed)
Pt called in and said that her ins is no longer covering her nebivolol (BYSTOLIC) 5 MG tablet [[id number]] .  What should she do?

## 2014-05-12 NOTE — Telephone Encounter (Signed)
Changed to a generic 

## 2014-05-19 ENCOUNTER — Encounter: Payer: Self-pay | Admitting: Gastroenterology

## 2014-05-19 ENCOUNTER — Ambulatory Visit (INDEPENDENT_AMBULATORY_CARE_PROVIDER_SITE_OTHER): Payer: Medicare Other | Admitting: Gastroenterology

## 2014-05-19 VITALS — BP 140/82 | HR 64 | Ht 64.0 in | Wt 218.0 lb

## 2014-05-19 DIAGNOSIS — Z86010 Personal history of colonic polyps: Secondary | ICD-10-CM

## 2014-05-19 NOTE — Progress Notes (Signed)
HPI: This is a   very pleasant 58 year old woman whom I am meeting for the first time today.   Had colonoscopy in [place], 10 years ago, Dr. Berta Minor colonoscopy.  Underwent 2 procedures to remove a polyp. I think this was 2 colonoscopies however it may have a flexible sigmoidoscopy followed by a colonoscopy. She did not undergo surgery definitely.Emily Wheeler  She was told she did not need another colonoscopy for 10 years.  No colon cancer in immediate family.  No bowel changes.    Overall her weight is up and down.   Review of systems: Pertinent positive and negative review of systems were noted in the above HPI section. Complete review of systems was performed and was otherwise normal.    Past Medical History  Diagnosis Date  . Asthma   . Diabetes mellitus without complication   . Hypertension   . Hyperlipidemia   . Anxiety   . Vertigo     hx of  . Frequency of urination     at night  . Numbness and tingling of both legs   . Arthritis   . Rheumatoid arthritis   . Hemorrhoids, external   . Anemia     mild    Past Surgical History  Procedure Laterality Date  . Tubal ligation    . Abdominal hysterectomy    . Carpal tunnel release Left   . Cyst removal neck    . Colonoscopy w/ polypectomy    . Hemorroidectomy    . Maximum access (mas)posterior lumbar interbody fusion (plif) 3 level N/A 10/08/2013    Procedure: FOR MAXIMUM ACCESS (MAS) POSTERIOR LUMBAR INTERBODY FUSION (PLIF) 3 LEVEL;  Surgeon: Maeola Harman, MD;  Location: [facility];  Service: Neurosurgery;  Laterality: N/A;  L3-4 L4-5 L5-S1 maximum access posterior lumbar interbody fusion with decompression    Current Outpatient Prescriptions  Medication Sig Dispense Refill  . albuterol (PROAIR HFA) 108 (90 BASE) MCG/ACT inhaler Inhale 1-2 puffs into the lungs every 6 (six) hours as needed for wheezing or shortness of breath. 18 g 3  . carvedilol (COREG) 6.25 MG tablet Take 1 tablet (6.25 mg total) by mouth 2 (two) times  daily with a meal. 180 tablet 3  . Chromium-Cinnamon [phone] MCG-MG CAPS Take 1 tablet by mouth daily.     . citalopram (CELEXA) 20 MG tablet TAKE ONE TABLET BY MOUTH ONCE DAILY 30 tablet 11  . hydrochlorothiazide (HYDRODIURIL) 25 MG tablet Take 1 tablet (25 mg total) by mouth daily. 90 tablet 3  . inFLIXimab (REMICADE) 100 MG injection Inject into the vein every 8 (eight) weeks.    Emily Wheeler lisinopril (PRINIVIL,ZESTRIL) 20 MG tablet TAKE ONE TABLET BY MOUTH ONCE DAILY 30 tablet 5  . metFORMIN (GLUCOPHAGE) 500 MG tablet TAKE ONE TABLET BY MOUTH TWICE DAILY WITH A MEAL 60 tablet 5  . Multiple Vitamin (MULTIVITAMIN) tablet Take 1 tablet by mouth daily.    Emily Wheeler NIFEdipine (PROCARDIA-XL/ADALAT-CC/NIFEDICAL-XL) 30 MG 24 hr tablet TAKE ONE TABLET BY MOUTH ONCE DAILY 90 tablet 3  . Omega-3 Fatty Acids (FISH OIL) 1200 MG CAPS Take 1,200 mg by mouth daily.     . rosuvastatin (CRESTOR) 20 MG tablet Take 1 tablet (20 mg total) by mouth daily. 90 tablet 3   No current facility-administered medications for this visit.    Allergies as of 05/19/2014  . (No Known Allergies)    Family History  Problem Relation Age of Onset  . Early death Father   . Heart disease Father   .  Hypertension Sister   . Hypertension Brother   . Diabetes Brother   . Alcohol abuse Neg Hx   . Hashimoto's thyroiditis Sister   . COPD Neg Hx   . Depression Neg Hx   . Drug abuse Neg Hx   . Hearing loss Neg Hx   . Hyperlipidemia Neg Hx   . Kidney disease Neg Hx   . Stroke Neg Hx   . Colon cancer Maternal Grandmother     in her 35s    History   Social History  . Marital Status: Single    Spouse Name: N/A    Number of Children: N/A  . Years of Education: N/A   Occupational History  . Not on file.   Social History Main Topics  . Smoking status: Former Games developer  . Smokeless tobacco: Former Neurosurgeon    Quit date: 01/30/1993  . Alcohol Use: 1.8 oz/week    3 Glasses of wine per week     Comment: 2 glasses of wine per week  . Drug  Use: Yes    Special: Marijuana  . Sexual Activity: Not Currently   Other Topics Concern  . Not on file   Social History Narrative       Physical Exam: BP 140/82 mmHg  Pulse 64  Ht 5\' 4"  (1.626 m)  Wt 218 lb (98.884 kg)  BMI 37.40 kg/m2  LMP 05/29/1991 Constitutional: generally well-appearing Psychiatric: alert and oriented x3 Eyes: extraocular movements intact Mouth: oral pharynx moist, no lesions Neck: supple no lymphadenopathy Cardiovascular: heart regular rate and rhythm Lungs: clear to auscultation bilaterally Abdomen: soft, nontender, nondistended, no obvious ascites, no peritoneal signs, normal bowel sounds Extremities: no lower extremity edema bilaterally Skin: no lesions on visible extremities    Assessment and plan: 58 y.o. female with  history of colon polyps  We will try to track down her colonoscopy, pathology reports from 65 from colonoscopy that was about 10 years ago and I will advise her on timing of her next screening, surveillance colonoscopy.

## 2014-05-19 NOTE — Patient Instructions (Addendum)
We will get records sent from your previous gastroenterologist in [facility], for review.  This will include any endoscopic (colonoscopy or upper endoscopy) procedures and any associated pathology reports.   After reviewing those records will decide on timing of your next screening, surveillance colonoscopy.

## 2014-05-26 ENCOUNTER — Ambulatory Visit
Admission: RE | Admit: 2014-05-26 | Discharge: 2014-05-26 | Disposition: A | Discharge status: Home / Self Care | Payer: Medicare Other | Source: Ambulatory Visit | Attending: Internal Medicine | Admitting: Internal Medicine

## 2014-05-26 ENCOUNTER — Other Ambulatory Visit: Payer: Self-pay | Admitting: Internal Medicine

## 2014-05-26 DIAGNOSIS — N63 Unspecified lump in unspecified breast: Secondary | ICD-10-CM

## 2014-05-28 LAB — HM MAMMOGRAPHY: HM Mammogram: NORMAL

## 2014-06-02 ENCOUNTER — Other Ambulatory Visit (HOSPITAL_COMMUNITY): Payer: Self-pay | Admitting: *Deleted

## 2014-06-03 ENCOUNTER — Ambulatory Visit (HOSPITAL_COMMUNITY)
Admission: RE | Admit: 2014-06-03 | Discharge: 2014-06-03 | Disposition: A | Discharge status: Home / Self Care | Payer: Medicare Other | Source: Ambulatory Visit | Attending: Rheumatology | Admitting: Rheumatology

## 2014-06-03 DIAGNOSIS — M456 Ankylosing spondylitis lumbar region: Secondary | ICD-10-CM | POA: Insufficient documentation

## 2014-06-03 MED ORDER — ACETAMINOPHEN 325 MG PO TABS
ORAL_TABLET | ORAL | Status: AC
  Administered 2014-06-03: 650 mg via ORAL
  Filled 2014-06-03: qty 2

## 2014-06-03 MED ORDER — ACETAMINOPHEN 325 MG PO TABS
650.0000 mg | ORAL_TABLET | ORAL | Status: AC
  Administered 2014-06-03 (×2): 650 mg via ORAL

## 2014-06-03 MED ORDER — SODIUM CHLORIDE 0.9 % IV SOLN
3.0000 mg/kg | INTRAVENOUS | Status: DC
  Administered 2014-06-03: 300 mg via INTRAVENOUS
  Filled 2014-06-03: qty 30

## 2014-06-03 MED ORDER — SODIUM CHLORIDE 0.9 % IV SOLN
INTRAVENOUS | Status: DC
  Administered 2014-06-03: 12:00:00 via INTRAVENOUS

## 2014-06-08 ENCOUNTER — Telehealth: Payer: Self-pay | Admitting: Internal Medicine

## 2014-06-08 DIAGNOSIS — M069 Rheumatoid arthritis, unspecified: Secondary | ICD-10-CM

## 2014-06-08 NOTE — Telephone Encounter (Signed)
done

## 2014-06-08 NOTE — Telephone Encounter (Signed)
Pt requesting a new referral to a rheumatologist, she has been seeing Dr Dareen Piano and owes him money and cannot be seen there, requesting new referral.

## 2014-06-14 ENCOUNTER — Other Ambulatory Visit: Payer: Self-pay | Admitting: Internal Medicine

## 2014-06-14 DIAGNOSIS — M069 Rheumatoid arthritis, unspecified: Secondary | ICD-10-CM

## 2014-07-08 ENCOUNTER — Ambulatory Visit (INDEPENDENT_AMBULATORY_CARE_PROVIDER_SITE_OTHER): Payer: Medicare Other | Admitting: Internal Medicine

## 2014-07-08 ENCOUNTER — Other Ambulatory Visit (INDEPENDENT_AMBULATORY_CARE_PROVIDER_SITE_OTHER): Payer: Medicare Other

## 2014-07-08 ENCOUNTER — Encounter: Payer: Self-pay | Admitting: Internal Medicine

## 2014-07-08 VITALS — BP 140/80 | HR 70 | Temp 98.5°F | Resp 20 | Ht 65.0 in | Wt 219.5 lb

## 2014-07-08 DIAGNOSIS — R0609 Other forms of dyspnea: Secondary | ICD-10-CM | POA: Insufficient documentation

## 2014-07-08 DIAGNOSIS — I10 Essential (primary) hypertension: Secondary | ICD-10-CM

## 2014-07-08 DIAGNOSIS — E876 Hypokalemia: Secondary | ICD-10-CM

## 2014-07-08 DIAGNOSIS — R05 Cough: Secondary | ICD-10-CM | POA: Insufficient documentation

## 2014-07-08 DIAGNOSIS — E118 Type 2 diabetes mellitus with unspecified complications: Secondary | ICD-10-CM | POA: Diagnosis not present

## 2014-07-08 DIAGNOSIS — T464X5A Adverse effect of angiotensin-converting-enzyme inhibitors, initial encounter: Principal | ICD-10-CM

## 2014-07-08 DIAGNOSIS — R9431 Abnormal electrocardiogram [ECG] [EKG]: Secondary | ICD-10-CM

## 2014-07-08 LAB — URINALYSIS, ROUTINE W REFLEX MICROSCOPIC
Bilirubin Urine: NEGATIVE
Hgb urine dipstick: NEGATIVE
Ketones, ur: NEGATIVE
Nitrite: NEGATIVE
RBC / HPF: NONE SEEN (ref 0–?)
Specific Gravity, Urine: 1.02 (ref 1.000–1.030)
Total Protein, Urine: NEGATIVE
Urine Glucose: NEGATIVE
Urobilinogen, UA: 0.2 (ref 0.0–1.0)
pH: 7 (ref 5.0–8.0)

## 2014-07-08 LAB — CBC WITH DIFFERENTIAL/PLATELET
Basophils Absolute: 0 10*3/uL (ref 0.0–0.1)
Basophils Relative: 0.7 % (ref 0.0–3.0)
Eosinophils Absolute: 0.3 10*3/uL (ref 0.0–0.7)
Eosinophils Relative: 5.3 % — ABNORMAL HIGH (ref 0.0–5.0)
HCT: 34.2 % — ABNORMAL LOW (ref 36.0–46.0)
Hemoglobin: 11.6 g/dL — ABNORMAL LOW (ref 12.0–15.0)
Lymphocytes Relative: 47.5 % — ABNORMAL HIGH (ref 12.0–46.0)
Lymphs Abs: 2.4 10*3/uL (ref 0.7–4.0)
MCHC: 33.8 g/dL (ref 30.0–36.0)
MCV: 90 fl (ref 78.0–100.0)
Monocytes Absolute: 0.7 10*3/uL (ref 0.1–1.0)
Monocytes Relative: 14.2 % — ABNORMAL HIGH (ref 3.0–12.0)
Neutro Abs: 1.6 10*3/uL (ref 1.4–7.7)
Neutrophils Relative %: 32.3 % — ABNORMAL LOW (ref 43.0–77.0)
Platelets: 219 10*3/uL (ref 150.0–400.0)
RBC: 3.79 Mil/uL — ABNORMAL LOW (ref 3.87–5.11)
RDW: 14.8 % (ref 11.5–15.5)
WBC: 5 10*3/uL (ref 4.0–10.5)

## 2014-07-08 LAB — COMPREHENSIVE METABOLIC PANEL
ALT: 19 U/L (ref 0–35)
AST: 18 U/L (ref 0–37)
Albumin: 4 g/dL (ref 3.5–5.2)
Alkaline Phosphatase: 96 U/L (ref 39–117)
BUN: 16 mg/dL (ref 6–23)
CO2: 29 mEq/L (ref 19–32)
Calcium: 8.9 mg/dL (ref 8.4–10.5)
Chloride: 108 mEq/L (ref 96–112)
Creatinine, Ser: 0.87 mg/dL (ref 0.40–1.20)
GFR: 85.31 mL/min (ref >60.00)
Glucose, Bld: 107 mg/dL — ABNORMAL HIGH (ref 70–99)
Potassium: 3.8 mEq/L (ref 3.5–5.1)
Sodium: 142 mEq/L (ref 135–145)
Total Bilirubin: 0.5 mg/dL (ref 0.2–1.2)
Total Protein: 6.9 g/dL (ref 6.0–8.3)

## 2014-07-08 LAB — HEMOGLOBIN A1C: Hgb A1c MFr Bld: 7.1 % — ABNORMAL HIGH (ref 4.6–6.5)

## 2014-07-08 LAB — CARDIAC PANEL
CK-MB: 2.4 ng/mL (ref 0.3–4.0)
Relative Index: 1.5 calc (ref 0.0–2.5)
Total CK: 158 U/L (ref 7–177)

## 2014-07-08 LAB — MICROALBUMIN / CREATININE URINE RATIO
Creatinine,U: 118.9 mg/dL
Microalb Creat Ratio: 1.8 mg/g (ref 0.0–30.0)
Microalb, Ur: 2.2 mg/dL — ABNORMAL HIGH (ref 0.0–1.9)

## 2014-07-08 LAB — TROPONIN I: TNIDX: 0 ug/l (ref 0.00–0.06)

## 2014-07-08 LAB — TSH: TSH: 1.42 u[IU]/mL (ref 0.35–4.50)

## 2014-07-08 LAB — BRAIN NATRIURETIC PEPTIDE: PRO B NATRI PEPTIDE: 24 pg/mL (ref 0.0–100.0)

## 2014-07-08 NOTE — Assessment & Plan Note (Signed)
Her exam is normal but her EKG shows mild LVH with NS ST/T wave changes Will check labs today for fluid overload, ischemia, anemia, and other causes Will check a CXR for edema and have asked her to get an ECHO done to check her EF and to check for DD

## 2014-07-08 NOTE — Assessment & Plan Note (Signed)
Will stop the ACEI Will check a CXR to look for mass, edema, TB, PNA

## 2014-07-08 NOTE — Progress Notes (Signed)
Pre visit review using our clinic review tool, if applicable. No additional management support is needed unless otherwise documented below in the visit note. 

## 2014-07-08 NOTE — Progress Notes (Signed)
Subjective:    Patient ID: Emily Wheeler, female    DOB: April 17, 1956, 59 y.o.   MRN: [id number]  Cough This is a recurrent problem. The current episode started more than 1 month ago. The problem has been unchanged. The cough is non-productive. Associated symptoms include shortness of breath (mild DOE). Pertinent negatives include no chest pain, chills, ear congestion, ear pain, fever, headaches, heartburn, hemoptysis, myalgias, nasal congestion, postnasal drip, rash, rhinorrhea, sore throat, sweats, weight loss or wheezing. Nothing aggravates the symptoms. She has tried OTC cough suppressant for the symptoms. The treatment provided mild relief. There is no history of asthma, bronchiectasis, bronchitis, COPD, emphysema, environmental allergies or pneumonia.      Review of Systems  Constitutional: Negative.  Negative for fever, chills, weight loss, diaphoresis, appetite change and fatigue.  HENT: Negative.  Negative for ear pain, postnasal drip, rhinorrhea and sore throat.   Eyes: Negative.   Respiratory: Positive for cough and shortness of breath (mild DOE). Negative for apnea, hemoptysis, choking, chest tightness, wheezing and stridor.   Cardiovascular: Negative.  Negative for chest pain, palpitations and leg swelling.  Gastrointestinal: Negative.  Negative for heartburn, nausea, vomiting, abdominal pain, diarrhea, constipation and blood in stool.  Endocrine: Negative.   Genitourinary: Negative.  Negative for dysuria, urgency, frequency, hematuria, decreased urine volume and difficulty urinating.  Musculoskeletal: Positive for arthralgias. Negative for myalgias, back pain, joint swelling, gait problem, neck pain and neck stiffness.  Skin: Negative.  Negative for rash.  Allergic/Immunologic: Negative.  Negative for environmental allergies.  Neurological: Negative.  Negative for headaches.  Hematological: Negative.  Negative for adenopathy. Does not bruise/bleed easily.    Psychiatric/Behavioral: Negative.        Objective:   Physical Exam  Constitutional: She is oriented to person, place, and time. She appears well-developed and well-nourished. No distress.  HENT:  Head: Normocephalic and atraumatic.  Mouth/Throat: Oropharynx is clear and moist. No oropharyngeal exudate.  Eyes: Conjunctivae are normal. Right eye exhibits no discharge. Left eye exhibits no discharge. No scleral icterus.  Neck: Normal range of motion. Neck supple. No JVD present. No tracheal deviation present. No thyromegaly present.  Cardiovascular: Normal rate, regular rhythm, normal heart sounds and intact distal pulses.  Exam reveals no gallop and no friction rub.   No murmur heard. Pulmonary/Chest: Effort normal and breath sounds normal. No stridor. No respiratory distress. She has no wheezes. She has no rales. She exhibits no tenderness.  Abdominal: Soft. Bowel sounds are normal. She exhibits no distension and no mass. There is no tenderness. There is no rebound and no guarding.  Musculoskeletal: Normal range of motion. She exhibits no edema or tenderness.  Lymphadenopathy:    She has no cervical adenopathy.  Neurological: She is oriented to person, place, and time.  Skin: Skin is warm and dry. No rash noted. She is not diaphoretic. No erythema. No pallor.  Vitals reviewed.     Lab Results  Component Value Date   WBC 6.0 02/08/2014   HGB 11.4* 02/08/2014   HCT 35.1* 02/08/2014   PLT 245.0 02/08/2014   GLUCOSE 138* 02/08/2014   CHOL 145 02/08/2014   TRIG 183.0* 02/08/2014   HDL 36.60* 02/08/2014   LDLDIRECT 162.5 01/30/2013   LDLCALC 72 02/08/2014   ALT 18 02/08/2014   AST 18 02/08/2014   NA 139 02/08/2014   K 3.7 02/08/2014   CL 104 02/08/2014   CREATININE 0.9 02/08/2014   BUN 16 02/08/2014   CO2 25 02/08/2014   TSH  0.71 02/08/2014   HGBA1C 7.8* 02/08/2014   MICROALBUR 3.0* 01/30/2013      Assessment & Plan:

## 2014-07-08 NOTE — Patient Instructions (Signed)
Cough, Adult  A cough is a reflex that helps clear your throat and airways. It can help heal the body or may be a reaction to an irritated airway. A cough may only last 2 or 3 weeks (acute) or may last more than 8 weeks (chronic).  CAUSES Acute cough:  Viral or bacterial infections. Chronic cough:  Infections.  Allergies.  Asthma.  Post-nasal drip.  Smoking.  Heartburn or acid reflux.  Some medicines.  Chronic lung problems (COPD).  Cancer. SYMPTOMS   Cough.  Fever.  Chest pain.  Increased breathing rate.  High-pitched whistling sound when breathing (wheezing).  Colored mucus that you cough up (sputum). TREATMENT   A bacterial cough may be treated with antibiotic medicine.  A viral cough must run its course and will not respond to antibiotics.  Your caregiver may recommend other treatments if you have a chronic cough. HOME CARE INSTRUCTIONS   Only take over-the-counter or prescription medicines for pain, discomfort, or fever as directed by your caregiver. Use cough suppressants only as directed by your caregiver.  Use a cold steam vaporizer or humidifier in your bedroom or home to help loosen secretions.  Sleep in a semi-upright position if your cough is worse at night.  Rest as needed.  Stop smoking if you smoke. SEEK IMMEDIATE MEDICAL CARE IF:   You have pus in your sputum.  Your cough starts to worsen.  You cannot control your cough with suppressants and are losing sleep.  You begin coughing up blood.  You have difficulty breathing.  You develop pain which is getting worse or is uncontrolled with medicine.  You have a fever. MAKE SURE YOU:   Understand these instructions.  Will watch your condition.  Will get help right away if you are not doing well or get worse. Document Released: 11/10/2010 Document Revised: 08/06/2011 Document Reviewed: 11/10/2010 ExitCare Patient Information 2015 [employer]. This information is not intended  to replace advice given to you by your health care provider. Make sure you discuss any questions you have with your health care provider.  

## 2014-07-08 NOTE — Assessment & Plan Note (Signed)
She is due for an A1C Will also check her renal function

## 2014-07-08 NOTE — Assessment & Plan Note (Signed)
Her BP is well controlled Will stop the ACEI Will recheck BP in 2-3 months and will treat if needed

## 2014-07-09 ENCOUNTER — Ambulatory Visit (INDEPENDENT_AMBULATORY_CARE_PROVIDER_SITE_OTHER)
Admission: RE | Admit: 2014-07-09 | Discharge: 2014-07-09 | Disposition: A | Discharge status: Home / Self Care | Payer: Medicare Other | Source: Ambulatory Visit | Attending: Internal Medicine | Admitting: Internal Medicine

## 2014-07-09 DIAGNOSIS — T464X5A Adverse effect of angiotensin-converting-enzyme inhibitors, initial encounter: Principal | ICD-10-CM

## 2014-07-09 DIAGNOSIS — R0602 Shortness of breath: Secondary | ICD-10-CM | POA: Diagnosis not present

## 2014-07-09 DIAGNOSIS — R05 Cough: Secondary | ICD-10-CM

## 2014-07-11 ENCOUNTER — Encounter: Payer: Self-pay | Admitting: Internal Medicine

## 2014-07-23 ENCOUNTER — Other Ambulatory Visit: Payer: Self-pay | Admitting: Internal Medicine

## 2014-07-29 ENCOUNTER — Ambulatory Visit (HOSPITAL_COMMUNITY)
Admission: RE | Admit: 2014-07-29 | Discharge: 2014-07-29 | Disposition: A | Discharge status: Home / Self Care | Payer: Medicare Other | Source: Ambulatory Visit | Attending: Rheumatology | Admitting: Rheumatology

## 2014-07-29 DIAGNOSIS — M0579 Rheumatoid arthritis with rheumatoid factor of multiple sites without organ or systems involvement: Secondary | ICD-10-CM | POA: Diagnosis not present

## 2014-07-29 MED ORDER — SODIUM CHLORIDE 0.9 % IV SOLN
INTRAVENOUS | Status: AC
  Administered 2014-07-29: 11:00:00 via INTRAVENOUS

## 2014-07-29 MED ORDER — SODIUM CHLORIDE 0.9 % IV SOLN
3.0000 mg/kg | INTRAVENOUS | Status: AC
  Administered 2014-07-29: 300 mg via INTRAVENOUS
  Filled 2014-07-29: qty 30

## 2014-07-29 MED ORDER — ACETAMINOPHEN 325 MG PO TABS
650.0000 mg | ORAL_TABLET | ORAL | Status: DC
  Administered 2014-07-29: 650 mg via ORAL

## 2014-07-30 MED ORDER — ACETAMINOPHEN 325 MG PO TABS
ORAL_TABLET | ORAL | Status: AC
  Filled 2014-07-30: qty 2

## 2014-08-30 ENCOUNTER — Ambulatory Visit (HOSPITAL_COMMUNITY): Payer: Medicare Other | Attending: Cardiology | Admitting: Cardiology

## 2014-08-30 DIAGNOSIS — M25562 Pain in left knee: Secondary | ICD-10-CM | POA: Diagnosis not present

## 2014-08-30 DIAGNOSIS — R9431 Abnormal electrocardiogram [ECG] [EKG]: Secondary | ICD-10-CM | POA: Diagnosis not present

## 2014-08-30 DIAGNOSIS — M25561 Pain in right knee: Secondary | ICD-10-CM | POA: Diagnosis not present

## 2014-08-30 DIAGNOSIS — M79641 Pain in right hand: Secondary | ICD-10-CM | POA: Diagnosis not present

## 2014-08-30 DIAGNOSIS — R0609 Other forms of dyspnea: Secondary | ICD-10-CM

## 2014-08-30 DIAGNOSIS — M79642 Pain in left hand: Secondary | ICD-10-CM | POA: Diagnosis not present

## 2014-08-30 DIAGNOSIS — M0570 Rheumatoid arthritis with rheumatoid factor of unspecified site without organ or systems involvement: Secondary | ICD-10-CM | POA: Diagnosis not present

## 2014-08-30 DIAGNOSIS — F5104 Psychophysiologic insomnia: Secondary | ICD-10-CM | POA: Diagnosis not present

## 2014-08-30 NOTE — Progress Notes (Signed)
Echo performed. 

## 2014-09-01 ENCOUNTER — Encounter: Payer: Self-pay | Admitting: Internal Medicine

## 2014-09-01 ENCOUNTER — Other Ambulatory Visit: Payer: Self-pay | Admitting: Internal Medicine

## 2014-09-01 DIAGNOSIS — R9431 Abnormal electrocardiogram [ECG] [EKG]: Secondary | ICD-10-CM

## 2014-09-01 DIAGNOSIS — R0609 Other forms of dyspnea: Secondary | ICD-10-CM

## 2014-09-01 DIAGNOSIS — R9430 Abnormal result of cardiovascular function study, unspecified: Secondary | ICD-10-CM

## 2014-09-02 ENCOUNTER — Other Ambulatory Visit: Payer: Self-pay | Admitting: Internal Medicine

## 2014-09-22 ENCOUNTER — Other Ambulatory Visit (HOSPITAL_COMMUNITY): Payer: Self-pay

## 2014-09-23 ENCOUNTER — Inpatient Hospital Stay (HOSPITAL_COMMUNITY): Admission: RE | Admit: 2014-09-23 | Payer: Medicare Other | Source: Ambulatory Visit

## 2014-10-19 ENCOUNTER — Ambulatory Visit (INDEPENDENT_AMBULATORY_CARE_PROVIDER_SITE_OTHER): Payer: Medicare Other | Admitting: Cardiology

## 2014-10-19 ENCOUNTER — Encounter: Payer: Self-pay | Admitting: Cardiology

## 2014-10-19 VITALS — BP 124/64 | HR 68 | Ht 64.0 in | Wt 218.0 lb

## 2014-10-19 DIAGNOSIS — E785 Hyperlipidemia, unspecified: Secondary | ICD-10-CM

## 2014-10-19 DIAGNOSIS — I429 Cardiomyopathy, unspecified: Secondary | ICD-10-CM | POA: Diagnosis not present

## 2014-10-19 DIAGNOSIS — I10 Essential (primary) hypertension: Secondary | ICD-10-CM

## 2014-10-19 DIAGNOSIS — R9430 Abnormal result of cardiovascular function study, unspecified: Secondary | ICD-10-CM | POA: Diagnosis not present

## 2014-10-19 DIAGNOSIS — R0609 Other forms of dyspnea: Secondary | ICD-10-CM | POA: Diagnosis not present

## 2014-10-19 MED ORDER — LOSARTAN POTASSIUM 50 MG PO TABS: 50.0000 mg | ORAL_TABLET | Freq: Every day | ORAL | Status: DC

## 2014-10-19 NOTE — Progress Notes (Signed)
Patient ID: Emily Wheeler, female   DOB: 10/21/1955, 59 y.o.   MRN: [id number]      Cardiology Office Note  Date:  10/19/2014   ID:  Emily Wheeler, DOB 10/22/1955, MRN [id number]  PCP:  Thomas Jones, MD  Cardiologist:  Jasier Calabretta H, MD   No chief complaint on file.  History of Present Illness: Emily Wheeler is a 59 y.o. female who presents for DOE. On disability for arthritis, doesn't walk because of that. Able to do ADL with limitations. Able to go buy groceries. She denies orthopnea, SOB at rest, PND, LE edema. No claudications, she quit smoking when she was 38. Denies chest pain.  Mother had MI, CHF in 50', father died of MI at age 34.    Past Medical History  Diagnosis Date  . Asthma   . Diabetes mellitus without complication   . Hypertension   . Hyperlipidemia   . Anxiety   . Vertigo     hx of  . Frequency of urination     at night  . Numbness and tingling of both legs   . Arthritis   . Rheumatoid arthritis   . Hemorrhoids, external   . Anemia     mild    Past Surgical History  Procedure Laterality Date  . Tubal ligation    . Abdominal hysterectomy    . Carpal tunnel release Left   . Cyst removal neck    . Colonoscopy w/ polypectomy    . Hemorroidectomy    . Maximum access (mas)posterior lumbar interbody fusion (plif) 3 level N/A 10/08/2013    Procedure: FOR MAXIMUM ACCESS (MAS) POSTERIOR LUMBAR INTERBODY FUSION (PLIF) 3 LEVEL;  Surgeon: Joseph Stern, MD;  Location: [facility];  Service: Neurosurgery;  Laterality: N/A;  L3-4 L4-5 L5-S1 maximum access posterior lumbar interbody fusion with decompression    Current Outpatient Prescriptions  Medication Sig Dispense Refill  . albuterol (PROAIR HFA) 108 (90 BASE) MCG/ACT inhaler Inhale 1-2 puffs into the lungs every 6 (six) hours as needed for wheezing or shortness of breath. 18 g 3  . carvedilol (COREG) 6.25 MG tablet Take 1 tablet (6.25 mg total) by mouth 2 (two) times daily with a meal. 180 tablet 3   . Chromium-Cinnamon 200-1000 MCG-MG CAPS Take 1 tablet by mouth daily.     . citalopram (CELEXA) 20 MG tablet TAKE ONE TABLET BY MOUTH ONCE DAILY 30 tablet 11  . gabapentin (NEURONTIN) 100 MG capsule Take 200 mg by mouth 3 (three) times daily.    . gabapentin (NEURONTIN) 600 MG tablet Take 1 tablet (600 mg total) by mouth 3 (three) times daily. 270 tablet 1  . hydrochlorothiazide (HYDRODIURIL) 25 MG tablet Take 1 tablet (25 mg total) by mouth daily. 90 tablet 3  . inFLIXimab (REMICADE) 100 MG injection Inject into the vein every 8 (eight) weeks.    . leflunomide (ARAVA) 10 MG tablet Take 10 mg by mouth daily.    . metFORMIN (GLUCOPHAGE) 500 MG tablet TAKE ONE TABLET BY MOUTH TWICE DAILY WITH A MEAL 60 tablet 5  . Multiple Vitamin (MULTIVITAMIN) tablet Take 1 tablet by mouth daily.    . Omega-3 Fatty Acids (FISH OIL) 1200 MG CAPS Take 1,200 mg by mouth daily.     . rosuvastatin (CRESTOR) 20 MG tablet Take 1 tablet (20 mg total) by mouth daily. 90 tablet 3  . losartan (COZAAR) 50 MG tablet Take 1 tablet (50 mg total) by mouth daily. 90 tablet 3     No current facility-administered medications for this visit.    Allergies:   Lisinopril    Social History:  The patient  reports that she has quit smoking. She quit smokeless tobacco use about 21 years ago. She reports that she drinks about 1.8 oz of alcohol per week. She reports that she uses illicit drugs (Marijuana).   Family History:  The patient's family history includes Colon cancer in her maternal grandmother; Diabetes in her brother; Early death in her father; Hashimoto's thyroiditis in her sister; Heart disease in her father; Hypertension in her brother and sister. There is no history of Alcohol abuse, COPD, Depression, Drug abuse, Hearing loss, Hyperlipidemia, Kidney disease, or Stroke.    ROS:  Please see the history of present illness.   Otherwise, review of systems are positive for none.   All other systems are reviewed and negative.     PHYSICAL EXAM: VS:  BP 124/64 mmHg  Pulse 68  Ht 5\' 4"  (1.626 m)  Wt 218 lb (98.884 kg)  BMI 37.40 kg/m2  LMP 05/29/1991 , BMI Body mass index is 37.4 kg/(m^2). GEN: Well nourished, well developed, in no acute distress HEENT: normal Neck: no JVD, carotid bruits, or masses Cardiac: RRR; 2/6 systolic murmur, rubs, or gallops,no edema  Respiratory:  clear to auscultation bilaterally, normal work of breathing GI: soft, nontender, nondistended, + BS MS: no deformity or atrophy Skin: warm and dry, no rash Neuro:  Strength and sensation are intact Psych: euthymic mood, full affect   EKG:  EKG is ordered today. The ekg ordered today demonstrates SR, LAD   Recent Labs: 07/08/2014: ALT 19; BUN 16; Creatinine 0.87; Hemoglobin 11.6*; Platelets 219.0; Potassium 3.8; Pro B Natriuretic peptide (BNP) 24.0; Sodium 142; TSH 1.42    Lipid Panel    Component Value Date/Time   CHOL 145 02/08/2014 1114   TRIG 183.0* 02/08/2014 1114   HDL 36.60* 02/08/2014 1114   CHOLHDL 4 02/08/2014 1114   VLDL 36.6 02/08/2014 1114   LDLCALC 72 02/08/2014 1114   LDLDIRECT 162.5 01/30/2013 1602      Wt Readings from Last 3 Encounters:  10/19/14 218 lb (98.884 kg)  07/29/14 219 lb (99.338 kg)  07/08/14 219 lb 8 oz (99.565 kg)     TTE: 08/30/14 Left ventricle: The cavity size was moderately dilated. Systolic function was moderately reduced. The estimated ejection fraction was in the range of 35% to 40%. Diffuse hypokinesis. There was an increased relative contribution of atrial contraction to ventricular filling. Doppler parameters are consistent with abnormal left ventricular relaxation (grade 1 diastolic dysfunction). - Mitral valve: There was trivial regurgitation. - Left atrium: The atrium was mildly dilated. - Tricuspid valve: There was trivial regurgitation. - Pulmonic valve: There was trivial regurgitation.   ASSESSMENT AND PLAN:  1. New dg of cardiomyopathy of unknown  etiology, moderately dilated LV with LVEF 35-40%, diffuse hypokinesis, we will schedule a Lexiscan nuclear stress test to evaluate for ischemia. Continue carvedilol, Lisinopril was discontinued because of cough, we will start losartan 50 mg po daily and d/c nifedipine.   2. HTN - controlled  3. HLP - on crestor 20 mg po daily.    Labs/ tests ordered today include:   Orders Placed This Encounter  Procedures  . Myocardial Perfusion Imaging  . EKG 12-Lead    Disposition:  Follow up in 3 months.  Signed, 10/30/14, MD  10/19/2014 3:01 PM    [facility] [address] [place], [place], [place]  [place]  Phone: [phone]; Fax: [phone]

## 2014-10-19 NOTE — Patient Instructions (Signed)
Medication Instructions:   STOP TAKING NIFEDIPINE NOW  START TAKING LOSARTAN 50 MG ONCE DAILY    Testing/Procedures:  Your physician has requested that you have a lexiscan myoview. For further information please visit [email or web address] Please follow instruction sheet, as given.    Follow-Up:  3 MONTHS WITH DR Delton See  Any Other Special Instructions Will Be Listed Below (If Applicable).

## 2014-10-20 ENCOUNTER — Ambulatory Visit (HOSPITAL_COMMUNITY): Payer: Medicare Other | Attending: Cardiology

## 2014-10-20 DIAGNOSIS — I429 Cardiomyopathy, unspecified: Secondary | ICD-10-CM | POA: Diagnosis not present

## 2014-10-20 DIAGNOSIS — R9430 Abnormal result of cardiovascular function study, unspecified: Secondary | ICD-10-CM

## 2014-10-20 DIAGNOSIS — R0609 Other forms of dyspnea: Secondary | ICD-10-CM

## 2014-10-20 MED ORDER — TECHNETIUM TC 99M SESTAMIBI GENERIC - CARDIOLITE
33.0000 | Freq: Once | INTRAVENOUS | Status: AC | PRN
  Administered 2014-10-20: 33 via INTRAVENOUS

## 2014-10-20 MED ORDER — REGADENOSON 0.4 MG/5ML IV SOLN
0.4000 mg | Freq: Once | INTRAVENOUS | Status: AC
  Administered 2014-10-20: 0.4 mg via INTRAVENOUS

## 2014-10-21 ENCOUNTER — Ambulatory Visit (HOSPITAL_COMMUNITY): Payer: Medicare Other | Attending: Internal Medicine

## 2014-10-21 LAB — MYOCARDIAL PERFUSION IMAGING
Estimated workload: 1 METS
LV dias vol: 164 mL
LV sys vol: 104 mL
Nuc Stress EF: 36 %
Peak BP: 158 mmHg
Peak HR: 108 {beats}/min
Percent of predicted max HR: 67 %
RATE: 0.28
Rest HR: 55 {beats}/min
SDS: 7
SRS: 6
SSS: 13
Stage 1 DBP: 94 mmHg
Stage 1 Grade: 0 %
Stage 1 HR: 59 {beats}/min
Stage 1 SBP: 159 mmHg
Stage 1 Speed: 0 mph
Stage 2 Grade: 0 %
Stage 2 HR: 58 {beats}/min
Stage 2 Speed: 0 mph
Stage 3 Grade: 0 %
Stage 3 HR: 98 {beats}/min
Stage 3 Speed: 0 mph
Stage 4 DBP: 99 mmHg
Stage 4 Grade: 0 %
Stage 4 HR: 108 {beats}/min
Stage 4 SBP: 158 mmHg
Stage 4 Speed: 0 mph
Stage 5 DBP: 101 mmHg
Stage 5 Grade: 0 %
Stage 5 HR: 105 {beats}/min
Stage 5 SBP: 172 mmHg
Stage 5 Speed: 0 mph
Stage 6 DBP: 101 mmHg
Stage 6 Grade: 0 %
Stage 6 HR: 77 {beats}/min
Stage 6 SBP: 166 mmHg
Stage 6 Speed: 0 mph
TID: 0.99

## 2014-10-21 MED ORDER — TECHNETIUM TC 99M SESTAMIBI GENERIC - CARDIOLITE
33.0000 | Freq: Once | INTRAVENOUS | Status: AC | PRN
  Administered 2014-10-21: 33 via INTRAVENOUS

## 2014-10-22 ENCOUNTER — Encounter: Payer: Self-pay | Admitting: *Deleted

## 2014-10-22 ENCOUNTER — Telehealth: Payer: Self-pay | Admitting: *Deleted

## 2014-10-22 DIAGNOSIS — Z01812 Encounter for preprocedural laboratory examination: Secondary | ICD-10-CM

## 2014-10-22 MED ORDER — ASPIRIN EC 81 MG PO TBEC: 81.0000 mg | DELAYED_RELEASE_TABLET | Freq: Every day | ORAL | Status: DC

## 2014-10-22 NOTE — Telephone Encounter (Signed)
Contacted the pt back to inform her that I scheduled her left cardiac cath for next Thursday 10/28/14 at 0730 with Dr Eldridge Dace.  Informed the pt that she must arrive at [facility], at 0530 the morning of her cath.  Informed the pt that I scheduled her for a lab appt at our office for next Tuesday 5/31 to check pre-procedure labs--PT/INR, CMET, CBC W DIFF.  Informed the pt that she will need to hold 2 meds for her cath.  Informed the pt that she will need to hold her HCTZ the day of, and hold her Metformin 24 hours prior to her cath, and 48 hours after her cath.  Informed the pt that all her other meds are safe to take.  Informed the pt that when she comes to our office for her 5/31 lab appt, there will be a cath letter of instruction for her to pick-up.  Informed the pt that she should ask the front desk receptionist at our office for this instruction letter, for this will inform her of all instructions to follow prior to having a cardiac cath.  Informed the pt if she has additional questions at that time, then she could ask to speak with myself or a triage nurse to go over the instruction letter with her.  Pt verbalized understanding and agrees with this plan.  Will send Dr Delton See a message to place hospital orders in for 6/2 cath.

## 2014-10-22 NOTE — Telephone Encounter (Signed)
Contacted the pt to inform her that per Dr Delton See her nuclear stress test was abnormal and she needs to be scheduled for a left sided cardiac cath. Informed the pt that this is done at [facility], report to short stay.  Informed the pt that she will need to come in for pre-procedure lab work at our office prior to her cath.  Pt requesting her cath to be set up for next Thursday 10/28/14, early morning, and come in for lab appt next Tuesday 5/31.  Informed the pt that I will call the cath lab to schedule this and return a call back to her very shortly.  Pt verbalized understanding and agrees with this plan.

## 2014-10-22 NOTE — Addendum Note (Signed)
Addended by: Lars Masson on: 10/22/2014 12:42 PM   Modules accepted: Orders

## 2014-10-22 NOTE — Telephone Encounter (Signed)
-----   Message from Lars Masson, MD sent at 10/22/2014  7:37 AM EDT ----- Abnormal stress test, she needs to be scheduled for a left sided cath

## 2014-10-22 NOTE — Telephone Encounter (Signed)
Contacted the pt to inform her that per Dr Delton See she would also like for the pt to start taking Aspirin 81 mg po daily, and continue taking this especially prior to her cath on 6/2.  Pt verbalized understanding and agrees with this plan.

## 2014-10-26 ENCOUNTER — Other Ambulatory Visit (INDEPENDENT_AMBULATORY_CARE_PROVIDER_SITE_OTHER): Payer: Medicare Other | Admitting: *Deleted

## 2014-10-26 DIAGNOSIS — Z01812 Encounter for preprocedural laboratory examination: Secondary | ICD-10-CM | POA: Diagnosis not present

## 2014-10-26 DIAGNOSIS — Z5181 Encounter for therapeutic drug level monitoring: Secondary | ICD-10-CM | POA: Diagnosis not present

## 2014-10-26 DIAGNOSIS — E785 Hyperlipidemia, unspecified: Secondary | ICD-10-CM | POA: Diagnosis not present

## 2014-10-26 DIAGNOSIS — I10 Essential (primary) hypertension: Secondary | ICD-10-CM | POA: Diagnosis not present

## 2014-10-26 LAB — COMPREHENSIVE METABOLIC PANEL
ALT: 20 U/L (ref 0–35)
AST: 18 U/L (ref 0–37)
Albumin: 4 g/dL (ref 3.5–5.2)
Alkaline Phosphatase: 76 U/L (ref 39–117)
BUN: 18 mg/dL (ref 6–23)
CO2: 30 mEq/L (ref 19–32)
Calcium: 8.9 mg/dL (ref 8.4–10.5)
Chloride: 103 mEq/L (ref 96–112)
Creatinine, Ser: 0.83 mg/dL (ref 0.40–1.20)
GFR: 89.98 mL/min (ref >60.00)
Glucose, Bld: 114 mg/dL — ABNORMAL HIGH (ref 70–99)
Potassium: 3.7 mEq/L (ref 3.5–5.1)
Sodium: 139 mEq/L (ref 135–145)
Total Bilirubin: 0.4 mg/dL (ref 0.2–1.2)
Total Protein: 6.9 g/dL (ref 6.0–8.3)

## 2014-10-26 LAB — CBC WITH DIFFERENTIAL/PLATELET
Basophils Absolute: 0 10*3/uL (ref 0.0–0.1)
Basophils Relative: 0.5 % (ref 0.0–3.0)
Eosinophils Absolute: 0.3 10*3/uL (ref 0.0–0.7)
Eosinophils Relative: 5.5 % — ABNORMAL HIGH (ref 0.0–5.0)
HCT: 34.2 % — ABNORMAL LOW (ref 36.0–46.0)
Hemoglobin: 11.4 g/dL — ABNORMAL LOW (ref 12.0–15.0)
Lymphocytes Relative: 40.8 % (ref 12.0–46.0)
Lymphs Abs: 2.2 10*3/uL (ref 0.7–4.0)
MCHC: 33.4 g/dL (ref 30.0–36.0)
MCV: 92.8 fl (ref 78.0–100.0)
Monocytes Absolute: 0.5 10*3/uL (ref 0.1–1.0)
Monocytes Relative: 9.6 % (ref 3.0–12.0)
Neutro Abs: 2.4 10*3/uL (ref 1.4–7.7)
Neutrophils Relative %: 43.6 % (ref 43.0–77.0)
Platelets: 239 10*3/uL (ref 150.0–400.0)
RBC: 3.68 Mil/uL — ABNORMAL LOW (ref 3.87–5.11)
RDW: 14.6 % (ref 11.5–15.5)
WBC: 5.4 10*3/uL (ref 4.0–10.5)

## 2014-10-26 LAB — PROTIME-INR
INR: 0.9 ratio (ref 0.8–1.0)
Prothrombin Time: 10.4 s (ref 9.6–13.1)

## 2014-10-28 ENCOUNTER — Ambulatory Visit (HOSPITAL_COMMUNITY)
Admission: RE | Admit: 2014-10-28 | Discharge: 2014-10-28 | Disposition: A | Discharge status: Home / Self Care | Payer: Commercial Managed Care - HMO | Source: Ambulatory Visit | Attending: Interventional Cardiology | Admitting: Interventional Cardiology

## 2014-10-28 ENCOUNTER — Encounter (HOSPITAL_COMMUNITY)
Admission: RE | Disposition: A | Discharge status: Home / Self Care | Payer: Commercial Managed Care - HMO | Source: Ambulatory Visit | Attending: Interventional Cardiology

## 2014-10-28 ENCOUNTER — Encounter (HOSPITAL_COMMUNITY): Payer: Self-pay | Admitting: Interventional Cardiology

## 2014-10-28 DIAGNOSIS — I2510 Atherosclerotic heart disease of native coronary artery without angina pectoris: Secondary | ICD-10-CM | POA: Diagnosis not present

## 2014-10-28 DIAGNOSIS — Z9851 Tubal ligation status: Secondary | ICD-10-CM | POA: Diagnosis not present

## 2014-10-28 DIAGNOSIS — M069 Rheumatoid arthritis, unspecified: Secondary | ICD-10-CM | POA: Diagnosis not present

## 2014-10-28 DIAGNOSIS — E119 Type 2 diabetes mellitus without complications: Secondary | ICD-10-CM | POA: Diagnosis not present

## 2014-10-28 DIAGNOSIS — J45909 Unspecified asthma, uncomplicated: Secondary | ICD-10-CM | POA: Diagnosis not present

## 2014-10-28 DIAGNOSIS — Z79899 Other long term (current) drug therapy: Secondary | ICD-10-CM | POA: Insufficient documentation

## 2014-10-28 DIAGNOSIS — R931 Abnormal findings on diagnostic imaging of heart and coronary circulation: Secondary | ICD-10-CM | POA: Diagnosis present

## 2014-10-28 DIAGNOSIS — F419 Anxiety disorder, unspecified: Secondary | ICD-10-CM | POA: Diagnosis not present

## 2014-10-28 DIAGNOSIS — R9430 Abnormal result of cardiovascular function study, unspecified: Secondary | ICD-10-CM

## 2014-10-28 DIAGNOSIS — I10 Essential (primary) hypertension: Secondary | ICD-10-CM | POA: Insufficient documentation

## 2014-10-28 DIAGNOSIS — E785 Hyperlipidemia, unspecified: Secondary | ICD-10-CM | POA: Insufficient documentation

## 2014-10-28 DIAGNOSIS — Z90710 Acquired absence of both cervix and uterus: Secondary | ICD-10-CM | POA: Insufficient documentation

## 2014-10-28 DIAGNOSIS — R9439 Abnormal result of other cardiovascular function study: Secondary | ICD-10-CM | POA: Insufficient documentation

## 2014-10-28 HISTORY — PX: CARDIAC CATHETERIZATION: SHX172

## 2014-10-28 LAB — GLUCOSE, CAPILLARY: Glucose-Capillary: 129 mg/dL — ABNORMAL HIGH (ref 65–99)

## 2014-10-28 SURGERY — LEFT HEART CATH AND CORONARY ANGIOGRAPHY
Anesthesia: LOCAL

## 2014-10-28 MED ORDER — SODIUM CHLORIDE 0.9 % IJ SOLN: 3.0000 mL | INTRAMUSCULAR | Status: DC | PRN

## 2014-10-28 MED ORDER — ASPIRIN 81 MG PO CHEW: 81.0000 mg | CHEWABLE_TABLET | ORAL | Status: DC

## 2014-10-28 MED ORDER — VERAPAMIL HCL 2.5 MG/ML IV SOLN
INTRAVENOUS | Status: DC | PRN
  Administered 2014-10-28: 08:00:00 via INTRA_ARTERIAL

## 2014-10-28 MED ORDER — FENTANYL CITRATE (PF) 100 MCG/2ML IJ SOLN
INTRAMUSCULAR | Status: AC
  Filled 2014-10-28: qty 2

## 2014-10-28 MED ORDER — HEPARIN SODIUM (PORCINE) 1000 UNIT/ML IJ SOLN
INTRAMUSCULAR | Status: DC | PRN
  Administered 2014-10-28: 5000 [IU] via INTRAVENOUS

## 2014-10-28 MED ORDER — MIDAZOLAM HCL 2 MG/2ML IJ SOLN
INTRAMUSCULAR | Status: AC
  Filled 2014-10-28: qty 2

## 2014-10-28 MED ORDER — VERAPAMIL HCL 2.5 MG/ML IV SOLN
INTRAVENOUS | Status: AC
  Filled 2014-10-28: qty 2

## 2014-10-28 MED ORDER — HEPARIN (PORCINE) IN NACL 2-0.9 UNIT/ML-% IJ SOLN
INTRAMUSCULAR | Status: AC
  Filled 2014-10-28: qty 1000

## 2014-10-28 MED ORDER — FENTANYL CITRATE (PF) 100 MCG/2ML IJ SOLN
INTRAMUSCULAR | Status: DC | PRN
  Administered 2014-10-28 (×2): 25 ug via INTRAVENOUS

## 2014-10-28 MED ORDER — METFORMIN HCL 500 MG PO TABS: 500.0000 mg | ORAL_TABLET | Freq: Two times a day (BID) | ORAL | Status: DC

## 2014-10-28 MED ORDER — SODIUM CHLORIDE 0.9 % IV SOLN
INTRAVENOUS | Status: DC
  Administered 2014-10-28: 06:00:00 via INTRAVENOUS

## 2014-10-28 MED ORDER — SODIUM CHLORIDE 0.9 % IJ SOLN: 3.0000 mL | Freq: Two times a day (BID) | INTRAMUSCULAR | Status: DC

## 2014-10-28 MED ORDER — LIDOCAINE HCL (PF) 1 % IJ SOLN
INTRAMUSCULAR | Status: AC
  Filled 2014-10-28: qty 30

## 2014-10-28 MED ORDER — NITROGLYCERIN 1 MG/10 ML FOR IR/CATH LAB
INTRA_ARTERIAL | Status: DC | PRN
  Administered 2014-10-28: 200 ug via INTRACORONARY

## 2014-10-28 MED ORDER — SODIUM CHLORIDE 0.9 % IV SOLN: 250.0000 mL | INTRAVENOUS | Status: DC | PRN

## 2014-10-28 MED ORDER — HEPARIN SODIUM (PORCINE) 1000 UNIT/ML IJ SOLN
INTRAMUSCULAR | Status: AC
  Filled 2014-10-28: qty 1

## 2014-10-28 MED ORDER — MIDAZOLAM HCL 2 MG/2ML IJ SOLN
INTRAMUSCULAR | Status: DC | PRN
  Administered 2014-10-28: 2 mg via INTRAVENOUS
  Administered 2014-10-28: 1 mg via INTRAVENOUS

## 2014-10-28 MED ORDER — SODIUM CHLORIDE 0.9 % WEIGHT BASED INFUSION: 1.0000 mL/kg/h | INTRAVENOUS | Status: DC

## 2014-10-28 MED ORDER — IOHEXOL 350 MG/ML SOLN
INTRAVENOUS | Status: DC | PRN
  Administered 2014-10-28: 70 mL via INTRA_ARTERIAL

## 2014-10-28 MED ORDER — NITROGLYCERIN 1 MG/10 ML FOR IR/CATH LAB
INTRA_ARTERIAL | Status: AC
  Filled 2014-10-28: qty 10

## 2014-10-28 SURGICAL SUPPLY — 14 items

## 2014-10-28 NOTE — Discharge Instructions (Signed)
Radial Site Care °Refer to this sheet in the next few weeks. These instructions provide you with information on caring for yourself after your procedure. Your caregiver may also give you more specific instructions. Your treatment has been planned according to current medical practices, but problems sometimes occur. Call your caregiver if you have any problems or questions after your procedure. °HOME CARE INSTRUCTIONS °· You may shower the day after the procedure. Remove the bandage (dressing) and gently wash the site with plain soap and water. Gently pat the site dry. °· Do not apply powder or lotion to the site. °· Do not submerge the affected site in water for 3 to 5 days. °· Inspect the site at least twice daily. °· Do not flex or bend the affected arm for 24 hours. °· No lifting over 5 pounds (2.3 kg) for 5 days after your procedure. °· Do not drive home if you are discharged the same day of the procedure. Have someone else drive you. °· You may drive 24 hours after the procedure unless otherwise instructed by your caregiver. °· Do not operate machinery or power tools for 24 hours. °· A responsible adult should be with you for the first 24 hours after you arrive home. °What to expect: °· Any bruising will usually fade within 1 to 2 weeks. °· Blood that collects in the tissue (hematoma) may be painful to the touch. It should usually decrease in size and tenderness within 1 to 2 weeks. °SEEK IMMEDIATE MEDICAL CARE IF: °· You have unusual pain at the radial site. °· You have redness, warmth, swelling, or pain at the radial site. °· You have drainage (other than a small amount of blood on the dressing). °· You have chills. °· You have a fever or persistent symptoms for more than 72 hours. °· You have a fever and your symptoms suddenly get worse. °· Your arm becomes pale, cool, tingly, or numb. °· You have heavy bleeding from the site. Hold pressure on the site and call 911. °Document Released: 06/16/2010 Document  Revised: 08/06/2011 Document Reviewed: 06/16/2010 °ExitCare® Patient Information ©2015 [employer]. This information is not intended to replace advice given to you by your health care provider. Make sure you discuss any questions you have with your health care provider. ° °

## 2014-10-28 NOTE — Interval H&P Note (Signed)
History and Physical Interval Note:  10/28/2014 7:53 AM  Emily Wheeler  has presented today for surgery, with the diagnosis of abnormal stress test  The various methods of treatment have been discussed with the patient and family. After consideration of risks, benefits and other options for treatment, the patient has consented to  Procedure(s): Left Heart Cath and Coronary Angiography (N/A) as a surgical intervention .  The patient's history has been reviewed, patient examined, no change in status, stable for surgery.  I have reviewed the patient's chart and labs.  Questions were answered to the patient's satisfaction.    Cath Lab Visit (complete for each Cath Lab visit)  Clinical Evaluation Leading to the Procedure:   ACS: No.  Non-ACS:    Anginal Classification: CCS III  Anti-ischemic medical therapy: Minimal Therapy (1 class of medications)  Non-Invasive Test Results: Intermediate-risk stress test findings: cardiac mortality 1-3%/year  Prior CABG: No previous CABG  Ischemic Symptoms? CCS III (Marked limitation of ordinary activity) Anti-ischemic Medical Therapy? Minimal Therapy (1 class of medications) Non-invasive Test Results? Intermediate-risk stress test findings: cardiac mortality 1-3%/year Prior CABG? No Previous CABG   Patient Information:   1-2V CAD, no prox LAD  U (6)  Indication: 16; Score: 6   Patient Information:   CTO of 1 vessel, no other CAD  U (6)  Indication: 26; Score: 6   Patient Information:   1V CAD with prox LAD  A (7)  Indication: 32; Score: 7   Patient Information:   2V-CAD with prox LAD  A (8)  Indication: 38; Score: 8   Patient Information:   3V-CAD without LMCA  A (8)  Indication: 44; Score: 8   Patient Information:   3V-CAD without LMCA With Abnormal LV systolic function  A (9)  Indication: 48; Score: 9   Patient Information:   LMCA-CAD  A (9)  Indication: 49; Score: 9   Patient Information:   2V-CAD  with prox LAD PCI  A (7)  Indication: 62; Score: 7   Patient Information:   2V-CAD with prox LAD CABG  A (8)  Indication: 62; Score: 8   Patient Information:   3V-CAD without LMCA With Low CAD burden(i.e., 3 focal stenoses, low SYNTAX score) PCI  A (7)  Indication: 63; Score: 7   Patient Information:   3V-CAD without LMCA With Low CAD burden(i.e., 3 focal stenoses, low SYNTAX score) CABG  A (9)  Indication: 63; Score: 9   Patient Information:   3V-CAD without LMCA E06c - Intermediate-high CAD burden (i.e., multiple diffuse lesions, presence of CTO, or high SYNTAX score) PCI  U (4)  Indication: 64; Score: 4   Patient Information:   3V-CAD without LMCA E06c - Intermediate-high CAD burden (i.e., multiple diffuse lesions, presence of CTO, or high SYNTAX score) CABG  A (9)  Indication: 64; Score: 9   Patient Information:   LMCA-CAD With Isolated LMCA stenosis  PCI  U (6)  Indication: 65; Score: 6   Patient Information:   LMCA-CAD Additional CAD, low CAD burden (i.e., 1- to 2-vessel additional involvement, low SYNTAX score) PCI  U (5)  Indication: 66; Score: 5   Patient Information:   LMCA-CAD Additional CAD, low CAD burden (i.e., 1- to 2-vessel additional involvement, low SYNTAX score) CABG  A (9)  Indication: 66; Score: 9   Patient Information:   LMCA-CAD With Isolated LMCA stenosis  CABG  A (9)  Indication: 66; Score: 9   Patient Information:   LMCA-CAD Additional CAD,  intermediate-high CAD burden (i.e., 3-vessel involvement, presence of CTO, or high SYNTAX score) PCI  I (3)  Indication: 67; Score: 3   Patient Information:   LMCA-CAD Additional CAD, intermediate-high CAD burden (i.e., 3-vessel involvement, presence of CTO, or high SYNTAX score) CABG  A (9)  Indication: 67; Score: 9      Eren Ryser S.

## 2014-10-28 NOTE — H&P (View-Only) (Signed)
Patient ID: Emily Wheeler, female   DOB: 05-18-1956, 59 y.o.   MRN: [id number]      Cardiology Office Note  Date:  10/19/2014   ID:  Emily Wheeler, DOB March 01, 1956, MRN [id number]  PCP:  Sanda Linger, MD  Cardiologist:  Lars Masson, MD   No chief complaint on file.  History of Present Illness: Emily Wheeler is a 59 y.o. female who presents for DOE. On disability for arthritis, doesn't walk because of that. Able to do ADL with limitations. Able to go buy groceries. She denies orthopnea, SOB at rest, PND, LE edema. No claudications, she quit smoking when she was 38. Denies chest pain.  Mother had MI, CHF in 73', father died of MI at age 76.    Past Medical History  Diagnosis Date  . Asthma   . Diabetes mellitus without complication   . Hypertension   . Hyperlipidemia   . Anxiety   . Vertigo     hx of  . Frequency of urination     at night  . Numbness and tingling of both legs   . Arthritis   . Rheumatoid arthritis   . Hemorrhoids, external   . Anemia     mild    Past Surgical History  Procedure Laterality Date  . Tubal ligation    . Abdominal hysterectomy    . Carpal tunnel release Left   . Cyst removal neck    . Colonoscopy w/ polypectomy    . Hemorroidectomy    . Maximum access (mas)posterior lumbar interbody fusion (plif) 3 level N/A 10/08/2013    Procedure: FOR MAXIMUM ACCESS (MAS) POSTERIOR LUMBAR INTERBODY FUSION (PLIF) 3 LEVEL;  Surgeon: Maeola Harman, MD;  Location: [facility];  Service: Neurosurgery;  Laterality: N/A;  L3-4 L4-5 L5-S1 maximum access posterior lumbar interbody fusion with decompression    Current Outpatient Prescriptions  Medication Sig Dispense Refill  . albuterol (PROAIR HFA) 108 (90 BASE) MCG/ACT inhaler Inhale 1-2 puffs into the lungs every 6 (six) hours as needed for wheezing or shortness of breath. 18 g 3  . carvedilol (COREG) 6.25 MG tablet Take 1 tablet (6.25 mg total) by mouth 2 (two) times daily with a meal. 180 tablet 3   . Chromium-Cinnamon [phone] MCG-MG CAPS Take 1 tablet by mouth daily.     . citalopram (CELEXA) 20 MG tablet TAKE ONE TABLET BY MOUTH ONCE DAILY 30 tablet 11  . gabapentin (NEURONTIN) 100 MG capsule Take 200 mg by mouth 3 (three) times daily.    Marland Kitchen gabapentin (NEURONTIN) 600 MG tablet Take 1 tablet (600 mg total) by mouth 3 (three) times daily. 270 tablet 1  . hydrochlorothiazide (HYDRODIURIL) 25 MG tablet Take 1 tablet (25 mg total) by mouth daily. 90 tablet 3  . inFLIXimab (REMICADE) 100 MG injection Inject into the vein every 8 (eight) weeks.    Marland Kitchen leflunomide (ARAVA) 10 MG tablet Take 10 mg by mouth daily.    . metFORMIN (GLUCOPHAGE) 500 MG tablet TAKE ONE TABLET BY MOUTH TWICE DAILY WITH A MEAL 60 tablet 5  . Multiple Vitamin (MULTIVITAMIN) tablet Take 1 tablet by mouth daily.    . Omega-3 Fatty Acids (FISH OIL) 1200 MG CAPS Take 1,200 mg by mouth daily.     . rosuvastatin (CRESTOR) 20 MG tablet Take 1 tablet (20 mg total) by mouth daily. 90 tablet 3  . losartan (COZAAR) 50 MG tablet Take 1 tablet (50 mg total) by mouth daily. 90 tablet 3  No current facility-administered medications for this visit.    Allergies:   Lisinopril    Social History:  The patient  reports that she has quit smoking. She quit smokeless tobacco use about 21 years ago. She reports that she drinks about 1.8 oz of alcohol per week. She reports that she uses illicit drugs (Marijuana).   Family History:  The patient's family history includes Colon cancer in her maternal grandmother; Diabetes in her brother; Early death in her father; Hashimoto's thyroiditis in her sister; Heart disease in her father; Hypertension in her brother and sister. There is no history of Alcohol abuse, COPD, Depression, Drug abuse, Hearing loss, Hyperlipidemia, Kidney disease, or Stroke.    ROS:  Please see the history of present illness.   Otherwise, review of systems are positive for none.   All other systems are reviewed and negative.     PHYSICAL EXAM: VS:  BP 124/64 mmHg  Pulse 68  Ht 5\' 4"  (1.626 m)  Wt 218 lb (98.884 kg)  BMI 37.40 kg/m2  LMP 05/29/1991 , BMI Body mass index is 37.4 kg/(m^2). GEN: Well nourished, well developed, in no acute distress HEENT: normal Neck: no JVD, carotid bruits, or masses Cardiac: RRR; 2/6 systolic murmur, rubs, or gallops,no edema  Respiratory:  clear to auscultation bilaterally, normal work of breathing GI: soft, nontender, nondistended, + BS MS: no deformity or atrophy Skin: warm and dry, no rash Neuro:  Strength and sensation are intact Psych: euthymic mood, full affect   EKG:  EKG is ordered today. The ekg ordered today demonstrates SR, LAD   Recent Labs: 07/08/2014: ALT 19; BUN 16; Creatinine 0.87; Hemoglobin 11.6*; Platelets 219.0; Potassium 3.8; Pro B Natriuretic peptide (BNP) 24.0; Sodium 142; TSH 1.42    Lipid Panel    Component Value Date/Time   CHOL 145 02/08/2014 1114   TRIG 183.0* 02/08/2014 1114   HDL 36.60* 02/08/2014 1114   CHOLHDL 4 02/08/2014 1114   VLDL 36.6 02/08/2014 1114   LDLCALC 72 02/08/2014 1114   LDLDIRECT 162.5 01/30/2013 1602      Wt Readings from Last 3 Encounters:  10/19/14 218 lb (98.884 kg)  07/29/14 219 lb (99.338 kg)  07/08/14 219 lb 8 oz (99.565 kg)     TTE: 08/30/14 Left ventricle: The cavity size was moderately dilated. Systolic function was moderately reduced. The estimated ejection fraction was in the range of 35% to 40%. Diffuse hypokinesis. There was an increased relative contribution of atrial contraction to ventricular filling. Doppler parameters are consistent with abnormal left ventricular relaxation (grade 1 diastolic dysfunction). - Mitral valve: There was trivial regurgitation. - Left atrium: The atrium was mildly dilated. - Tricuspid valve: There was trivial regurgitation. - Pulmonic valve: There was trivial regurgitation.   ASSESSMENT AND PLAN:  1. New dg of cardiomyopathy of unknown  etiology, moderately dilated LV with LVEF 35-40%, diffuse hypokinesis, we will schedule a Lexiscan nuclear stress test to evaluate for ischemia. Continue carvedilol, Lisinopril was discontinued because of cough, we will start losartan 50 mg po daily and d/c nifedipine.   2. HTN - controlled  3. HLP - on crestor 20 mg po daily.    Labs/ tests ordered today include:   Orders Placed This Encounter  Procedures  . Myocardial Perfusion Imaging  . EKG 12-Lead    Disposition:  Follow up in 3 months.  Signed, 10/30/14, MD  10/19/2014 3:01 PM    [facility] [address] [place], [place], [place]  [place]  Phone: [phone]; Fax: [phone]

## 2014-10-29 ENCOUNTER — Telehealth: Payer: Self-pay | Admitting: *Deleted

## 2014-10-29 DIAGNOSIS — I428 Other cardiomyopathies: Secondary | ICD-10-CM

## 2014-10-29 MED FILL — Lidocaine HCl Local Preservative Free (PF) Inj 1%: INTRAMUSCULAR | Qty: 30 | Status: AC

## 2014-10-29 MED FILL — Heparin Sodium (Porcine) 2 Unit/ML in Sodium Chloride 0.9%: INTRAMUSCULAR | Qty: 1000 | Status: AC

## 2014-10-29 NOTE — Telephone Encounter (Signed)
Pt scheduled for an echo on 01/12/15 at 1130, prior to follow-up OV with Dr Delton See.

## 2014-10-29 NOTE — Telephone Encounter (Signed)
Contacted the pt to inform her that Dr Delton See reviewed her cardiac cath report that was sent by Dr Eldridge Dace, who performed her cath.  Per Dr Hoyle Barr report the pt has non-ischemic cardiomyopathy.  Provided pt education on what this diagnosis is.  Informed the pt that per Dr Delton See her recommendations for this pt based on her cath report is for her to have a repeat echo in early August, prior to her scheduled follow-up appt with Delton See on 01/20/15.  Informed the pt that I will send our schedulers a message to contact the pt to have this test scheduled.  Pt verbalized understanding and agrees with this plan.

## 2014-11-02 DIAGNOSIS — M069 Rheumatoid arthritis, unspecified: Secondary | ICD-10-CM | POA: Diagnosis not present

## 2014-11-02 DIAGNOSIS — M545 Low back pain: Secondary | ICD-10-CM | POA: Diagnosis not present

## 2014-11-02 DIAGNOSIS — M79641 Pain in right hand: Secondary | ICD-10-CM | POA: Diagnosis not present

## 2014-11-02 DIAGNOSIS — M79642 Pain in left hand: Secondary | ICD-10-CM | POA: Diagnosis not present

## 2014-11-02 DIAGNOSIS — M2392 Unspecified internal derangement of left knee: Secondary | ICD-10-CM | POA: Diagnosis not present

## 2014-11-09 ENCOUNTER — Other Ambulatory Visit (HOSPITAL_COMMUNITY): Payer: Commercial Managed Care - HMO

## 2014-11-30 ENCOUNTER — Other Ambulatory Visit: Payer: Self-pay | Admitting: Internal Medicine

## 2014-12-08 ENCOUNTER — Telehealth: Payer: Self-pay | Admitting: Internal Medicine

## 2014-12-08 DIAGNOSIS — M4316 Spondylolisthesis, lumbar region: Secondary | ICD-10-CM

## 2014-12-08 NOTE — Telephone Encounter (Signed)
Pt called in said that she needs another referral to Dr Andres Shad office, Neurology.  They updated her medicare card and now she needs a new referral put in

## 2014-12-09 NOTE — Addendum Note (Signed)
Addended by: [name] on: 12/09/2014 08:20 AM   Modules accepted: Orders

## 2014-12-09 NOTE — Telephone Encounter (Signed)
Dr. Venetia Maxon Berkley Harvey #[id number] valid 12/09/14-06/07/15 for 6 visits. Left message for patient to make her aware.

## 2014-12-31 ENCOUNTER — Other Ambulatory Visit: Payer: Self-pay | Admitting: Internal Medicine

## 2015-01-12 ENCOUNTER — Other Ambulatory Visit (HOSPITAL_COMMUNITY): Payer: Commercial Managed Care - HMO

## 2015-01-20 ENCOUNTER — Ambulatory Visit: Payer: Medicare Other | Admitting: Cardiology

## 2015-01-25 DIAGNOSIS — M545 Low back pain: Secondary | ICD-10-CM | POA: Diagnosis not present

## 2015-01-25 DIAGNOSIS — M5416 Radiculopathy, lumbar region: Secondary | ICD-10-CM | POA: Diagnosis not present

## 2015-01-25 DIAGNOSIS — M4316 Spondylolisthesis, lumbar region: Secondary | ICD-10-CM | POA: Diagnosis not present

## 2015-02-01 ENCOUNTER — Other Ambulatory Visit: Payer: Self-pay | Admitting: Internal Medicine

## 2015-02-02 DIAGNOSIS — Z79899 Other long term (current) drug therapy: Secondary | ICD-10-CM | POA: Diagnosis not present

## 2015-02-02 DIAGNOSIS — M2550 Pain in unspecified joint: Secondary | ICD-10-CM | POA: Diagnosis not present

## 2015-02-02 DIAGNOSIS — E119 Type 2 diabetes mellitus without complications: Secondary | ICD-10-CM | POA: Diagnosis not present

## 2015-02-02 DIAGNOSIS — M25421 Effusion, right elbow: Secondary | ICD-10-CM | POA: Diagnosis not present

## 2015-02-02 DIAGNOSIS — M25461 Effusion, right knee: Secondary | ICD-10-CM | POA: Diagnosis not present

## 2015-02-10 ENCOUNTER — Other Ambulatory Visit: Payer: Self-pay

## 2015-02-10 ENCOUNTER — Ambulatory Visit (HOSPITAL_COMMUNITY): Payer: Commercial Managed Care - HMO | Attending: Cardiology

## 2015-02-10 DIAGNOSIS — E785 Hyperlipidemia, unspecified: Secondary | ICD-10-CM | POA: Diagnosis not present

## 2015-02-10 DIAGNOSIS — E119 Type 2 diabetes mellitus without complications: Secondary | ICD-10-CM | POA: Insufficient documentation

## 2015-02-10 DIAGNOSIS — I428 Other cardiomyopathies: Secondary | ICD-10-CM

## 2015-02-10 DIAGNOSIS — I10 Essential (primary) hypertension: Secondary | ICD-10-CM | POA: Insufficient documentation

## 2015-02-10 DIAGNOSIS — I429 Cardiomyopathy, unspecified: Secondary | ICD-10-CM | POA: Diagnosis not present

## 2015-02-11 ENCOUNTER — Other Ambulatory Visit: Payer: Self-pay | Admitting: Internal Medicine

## 2015-02-17 ENCOUNTER — Encounter: Payer: Self-pay | Admitting: Cardiology

## 2015-02-17 ENCOUNTER — Ambulatory Visit (INDEPENDENT_AMBULATORY_CARE_PROVIDER_SITE_OTHER): Payer: Commercial Managed Care - HMO | Admitting: Cardiology

## 2015-02-17 VITALS — BP 142/80 | HR 57 | Ht 64.0 in | Wt 219.2 lb

## 2015-02-17 DIAGNOSIS — E785 Hyperlipidemia, unspecified: Secondary | ICD-10-CM

## 2015-02-17 DIAGNOSIS — I5021 Acute systolic (congestive) heart failure: Secondary | ICD-10-CM

## 2015-02-17 DIAGNOSIS — I10 Essential (primary) hypertension: Secondary | ICD-10-CM | POA: Diagnosis not present

## 2015-02-17 DIAGNOSIS — I429 Cardiomyopathy, unspecified: Secondary | ICD-10-CM

## 2015-02-17 LAB — COMPREHENSIVE METABOLIC PANEL
ALT: 20 U/L (ref 0–35)
AST: 11 U/L (ref 0–37)
Albumin: 4 g/dL (ref 3.5–5.2)
Alkaline Phosphatase: 82 U/L (ref 39–117)
BUN: 20 mg/dL (ref 6–23)
CO2: 31 mEq/L (ref 19–32)
Calcium: 9 mg/dL (ref 8.4–10.5)
Chloride: 99 mEq/L (ref 96–112)
Creatinine, Ser: 0.88 mg/dL (ref 0.40–1.20)
GFR: 84.02 mL/min (ref >60.00)
Glucose, Bld: 129 mg/dL — ABNORMAL HIGH (ref 70–99)
Potassium: 3.9 mEq/L (ref 3.5–5.1)
Sodium: 136 mEq/L (ref 135–145)
Total Bilirubin: 0.5 mg/dL (ref 0.2–1.2)
Total Protein: 6.9 g/dL (ref 6.0–8.3)

## 2015-02-17 LAB — LIPID PANEL
Cholesterol: 167 mg/dL (ref 0–200)
HDL: 61.8 mg/dL (ref >39.00)
LDL Cholesterol: 77 mg/dL (ref 0–99)
NonHDL: 104.71
Total CHOL/HDL Ratio: 3
Triglycerides: 140 mg/dL (ref 0.0–149.0)
VLDL: 28 mg/dL (ref 0.0–40.0)

## 2015-02-17 MED ORDER — LOSARTAN POTASSIUM 100 MG PO TABS: 100.0000 mg | ORAL_TABLET | Freq: Every day | ORAL | Status: DC

## 2015-02-17 NOTE — Progress Notes (Signed)
Patient ID: Emily Wheeler, female   DOB: July 12, 1955, 59 y.o.   MRN: [id number] Patient ID: Emily Wheeler, female   DOB: 27-Mar-1956, 59 y.o.   MRN: [id number]      Cardiology Office Note  Date:  2015/03/10   ID:  Emily Wheeler, DOB [birth year], MRN [id number]  PCP:  Sanda Linger, MD  Cardiologist:  Lars Masson, MD   No chief complaint on file.  History of Present Illness: Emily Wheeler is a 59 y.o. female who presents for DOE. On disability for arthritis, doesn't walk because of that. Able to do ADL with limitations. Able to go buy groceries. She denies orthopnea, SOB at rest, PND, LE edema. No claudications, she quit smoking when she was 38. Denies chest pain.  Mother had MI, CHF in 16', father died of MI at age 15.   03-10-2015 - 4 months follow up, she underwent cardiac cath for an abnormal stress test, cath showed mild CAD, a diagnosis of NICMP was made. She feels much better, trying to exercise, her functional capacity is improving, denies chest pain mild DOE, improved palpitations with no dizziness. No orthopnea, PND, minimal LE edema.   Past Medical History  Diagnosis Date  . Asthma   . Diabetes mellitus without complication   . Hypertension   . Hyperlipidemia   . Anxiety   . Vertigo     hx of  . Frequency of urination     at night  . Numbness and tingling of both legs   . Arthritis   . Rheumatoid arthritis   . Hemorrhoids, external   . Anemia     mild    Past Surgical History  Procedure Laterality Date  . Tubal ligation    . Abdominal hysterectomy    . Carpal tunnel release Left   . Cyst removal neck    . Colonoscopy w/ polypectomy    . Hemorroidectomy    . Maximum access (mas)posterior lumbar interbody fusion (plif) 3 level N/A 10/08/2013    Procedure: FOR MAXIMUM ACCESS (MAS) POSTERIOR LUMBAR INTERBODY FUSION (PLIF) 3 LEVEL;  Surgeon: Maeola Harman, MD;  Location: [facility];  Service: Neurosurgery;  Laterality: N/A;  L3-4 L4-5 L5-S1 maximum  access posterior lumbar interbody fusion with decompression  . Cardiac catheterization N/A 10/28/2014    Procedure: Left Heart Cath and Coronary Angiography;  Surgeon: Corky Crafts, MD;  Location: [facility] CV [facility];  Service: Cardiovascular;  Laterality: N/A;    Current Outpatient Prescriptions  Medication Sig Dispense Refill  . albuterol (PROAIR HFA) 108 (90 BASE) MCG/ACT inhaler Inhale 1-2 puffs into the lungs every 6 (six) hours as needed for wheezing or shortness of breath. 18 g 3  . aspirin EC 81 MG tablet Take 1 tablet (81 mg total) by mouth daily. 90 tablet 3  . Calcium Carb-Cholecalciferol (CALCIUM 600 + D PO) Take 1 tablet by mouth daily.    . carvedilol (COREG) 6.25 MG tablet Take 1 tablet (6.25 mg total) by mouth 2 (two) times daily with a meal. 180 tablet 3  . Chromium-Cinnamon [phone] MCG-MG CAPS Take 1 tablet by mouth daily.     . citalopram (CELEXA) 20 MG tablet TAKE ONE TABLET BY MOUTH ONCE DAILY 30 tablet 11  . gabapentin (NEURONTIN) 600 MG tablet TAKE ONE TABLET BY MOUTH THREE TIMES DAILY 270 tablet 3  . Glycerin-Hypromellose-PEG 400 (VISINE TEARS OP) Place 1 drop into both eyes daily as needed (for dry eyes).    . hydrochlorothiazide (  HYDRODIURIL) 25 MG tablet TAKE ONE TABLET BY MOUTH ONCE DAILY 90 tablet 0  . losartan (COZAAR) 50 MG tablet Take 1 tablet (50 mg total) by mouth daily. 90 tablet 3  . metFORMIN (GLUCOPHAGE) 500 MG tablet Take 1 tablet (500 mg total) by mouth 2 (two) times daily with a meal. 60 tablet 5  . Multiple Vitamin (MULTIVITAMIN) tablet Take 1 tablet by mouth daily.    . Omega-3 Fatty Acids (FISH OIL) 1200 MG CAPS Take 1,200 mg by mouth daily.     . predniSONE (DELTASONE) 10 MG tablet Take 10 mg by mouth daily.    . rosuvastatin (CRESTOR) 20 MG tablet Take 1 tablet (20 mg total) by mouth daily. 90 tablet 3  . vitamin C (ASCORBIC ACID) 500 MG tablet Take 500 mg by mouth daily as needed (for cold symptoms).     No current facility-administered  medications for this visit.    Allergies:   Lisinopril    Social History:  The patient  reports that she has quit smoking. She quit smokeless tobacco use about 22 years ago. She reports that she drinks about 1.8 oz of alcohol per week. She reports that she uses illicit drugs (Marijuana).   Family History:  The patient's family history includes Colon cancer in her maternal grandmother; Diabetes in her brother; Early death in her father; Hashimoto's thyroiditis in her sister; Heart disease in her father; Hypertension in her brother and sister. There is no history of Alcohol abuse, COPD, Depression, Drug abuse, Hearing loss, Hyperlipidemia, Kidney disease, or Stroke.    ROS:  Please see the history of present illness.   Otherwise, review of systems are positive for none.   All other systems are reviewed and negative.    PHYSICAL EXAM: VS:  BP 142/80 mmHg  Pulse 57  Ht 5\' 4"  (1.626 m)  Wt 219 lb 3.2 oz (99.428 kg)  BMI 37.61 kg/m2  LMP 05/29/1991 , BMI Body mass index is 37.61 kg/(m^2). GEN: Well nourished, well developed, in no acute distress HEENT: normal Neck: no JVD, carotid bruits, or masses Cardiac: RRR; 2/6 systolic murmur, rubs, or gallops,no edema  Respiratory:  clear to auscultation bilaterally, normal work of breathing GI: soft, nontender, nondistended, + BS MS: no deformity or atrophy Skin: warm and dry, no rash Neuro:  Strength and sensation are intact Psych: euthymic mood, full affect   EKG:  EKG is ordered today. The ekg ordered today demonstrates SR, LAD   Recent Labs: 07/08/2014: Pro B Natriuretic peptide (BNP) 24.0; TSH 1.42 10/26/2014: ALT 20; BUN 18; Creatinine, Ser 0.83; Hemoglobin 11.4*; Platelets 239.0; Potassium 3.7; Sodium 139    Lipid Panel    Component Value Date/Time   CHOL 145 02/08/2014 1114   TRIG 183.0* 02/08/2014 1114   HDL 36.60* 02/08/2014 1114   CHOLHDL 4 02/08/2014 1114   VLDL 36.6 02/08/2014 1114   LDLCALC 72 02/08/2014 1114    LDLDIRECT 162.5 01/30/2013 1602      Wt Readings from Last 3 Encounters:  02/17/15 219 lb 3.2 oz (99.428 kg)  10/28/14 219 lb (99.338 kg)  10/20/14 215 lb (97.523 kg)     TTE: 08/30/14 Left ventricle: The cavity size was moderately dilated. Systolic function was moderately reduced. The estimated ejection fraction was in the range of 35% to 40%. Diffuse hypokinesis. There was an increased relative contribution of atrial contraction to ventricular filling. Doppler parameters are consistent with abnormal left ventricular relaxation (grade 1 diastolic dysfunction). - Mitral valve: There was trivial  regurgitation. - Left atrium: The atrium was mildly dilated. - Tricuspid valve: There was trivial regurgitation. - Pulmonic valve: There was trivial regurgitation.  TTE 02/10/2015 - Left ventricle: The cavity size was normal. Wall thickness was normal. Systolic function was mildly to moderately reduced. The estimated ejection fraction was in the range of 40% to 45%. Diffuse hypokinesis. Doppler parameters are consistent with abnormal left ventricular relaxation (grade 1 diastolic dysfunction). Impressions: - When compared to prior, EF is mildly improved.    ASSESSMENT AND PLAN:  1. New dg of cardiomyopathy of unknown etiology, moderately dilated LV with LVEF 35-40%, diffuse hypokinesis, cath showed mild to moderate CAD, continue carvedilol, losartan, crestor, ASA. Lisinopril was discontinued because of cough.  LVEF improved to 40-45% on echo from 02/10/15, appears euvolemic, NYHA II. Increase losartan to 100 mg po daily.  2. HTN - uncontrolled, increase losartan to 100 mg po daily.  3. HLP - on crestor 20 mg po daily.   Follow up in 3 months, check CMP and lipids today.  Signed, Lars Masson, MD  02/17/2015 10:26 AM    [facility] [address] [place], [place], [place]  [address] Phone: [phone]; Fax: [phone]

## 2015-02-17 NOTE — Patient Instructions (Signed)
Medication Instructions:   INCREASE YOUR LOSARTAN TO 100 MG ONCE DAILY    Labwork:  TODAY---CMET AND LIPIDS      Follow-Up:  3 MONTHS WITH DR Delton See

## 2015-02-18 ENCOUNTER — Other Ambulatory Visit: Payer: Self-pay | Admitting: Internal Medicine

## 2015-02-24 ENCOUNTER — Other Ambulatory Visit: Payer: Self-pay | Admitting: Internal Medicine

## 2015-02-28 DIAGNOSIS — M25521 Pain in right elbow: Secondary | ICD-10-CM | POA: Diagnosis not present

## 2015-02-28 DIAGNOSIS — M25561 Pain in right knee: Secondary | ICD-10-CM | POA: Diagnosis not present

## 2015-02-28 DIAGNOSIS — Z79899 Other long term (current) drug therapy: Secondary | ICD-10-CM | POA: Diagnosis not present

## 2015-02-28 DIAGNOSIS — M25562 Pain in left knee: Secondary | ICD-10-CM | POA: Diagnosis not present

## 2015-02-28 DIAGNOSIS — M069 Rheumatoid arthritis, unspecified: Secondary | ICD-10-CM | POA: Diagnosis not present

## 2015-03-05 ENCOUNTER — Other Ambulatory Visit: Payer: Self-pay | Admitting: Internal Medicine

## 2015-03-09 ENCOUNTER — Encounter: Payer: Self-pay | Admitting: Internal Medicine

## 2015-03-09 ENCOUNTER — Ambulatory Visit (INDEPENDENT_AMBULATORY_CARE_PROVIDER_SITE_OTHER): Payer: Commercial Managed Care - HMO | Admitting: Internal Medicine

## 2015-03-09 ENCOUNTER — Other Ambulatory Visit (INDEPENDENT_AMBULATORY_CARE_PROVIDER_SITE_OTHER): Payer: Commercial Managed Care - HMO

## 2015-03-09 VITALS — BP 124/80 | HR 64 | Temp 98.1°F | Resp 16 | Ht 64.0 in | Wt 220.0 lb

## 2015-03-09 DIAGNOSIS — D510 Vitamin B12 deficiency anemia due to intrinsic factor deficiency: Secondary | ICD-10-CM | POA: Diagnosis not present

## 2015-03-09 DIAGNOSIS — Z23 Encounter for immunization: Secondary | ICD-10-CM

## 2015-03-09 DIAGNOSIS — I10 Essential (primary) hypertension: Secondary | ICD-10-CM

## 2015-03-09 DIAGNOSIS — E785 Hyperlipidemia, unspecified: Secondary | ICD-10-CM

## 2015-03-09 DIAGNOSIS — M48061 Spinal stenosis, lumbar region without neurogenic claudication: Secondary | ICD-10-CM

## 2015-03-09 DIAGNOSIS — E118 Type 2 diabetes mellitus with unspecified complications: Secondary | ICD-10-CM | POA: Diagnosis not present

## 2015-03-09 DIAGNOSIS — E876 Hypokalemia: Secondary | ICD-10-CM | POA: Diagnosis not present

## 2015-03-09 DIAGNOSIS — I429 Cardiomyopathy, unspecified: Secondary | ICD-10-CM

## 2015-03-09 DIAGNOSIS — Z86010 Personal history of colonic polyps: Secondary | ICD-10-CM | POA: Insufficient documentation

## 2015-03-09 DIAGNOSIS — M4806 Spinal stenosis, lumbar region: Secondary | ICD-10-CM

## 2015-03-09 LAB — IBC PANEL
Iron: 81 ug/dL (ref 42–145)
SATURATION RATIOS: 21.8 % (ref 20.0–50.0)
TRANSFERRIN: 266 mg/dL (ref 212.0–360.0)

## 2015-03-09 LAB — CBC WITH DIFFERENTIAL/PLATELET
BASOS ABS: 0 10*3/uL (ref 0.0–0.1)
BASOS PCT: 0.4 % (ref 0.0–3.0)
EOS ABS: 0.1 10*3/uL (ref 0.0–0.7)
Eosinophils Relative: 1.4 % (ref 0.0–5.0)
HEMATOCRIT: 36.9 % (ref 36.0–46.0)
HEMOGLOBIN: 11.9 g/dL — AB (ref 12.0–15.0)
LYMPHS PCT: 52 % — AB (ref 12.0–46.0)
Lymphs Abs: 4.1 10*3/uL — ABNORMAL HIGH (ref 0.7–4.0)
MCHC: 32.2 g/dL (ref 30.0–36.0)
MCV: 93.9 fl (ref 78.0–100.0)
Monocytes Absolute: 0.6 10*3/uL (ref 0.1–1.0)
Monocytes Relative: 7.9 % (ref 3.0–12.0)
Neutro Abs: 3 10*3/uL (ref 1.4–7.7)
Neutrophils Relative %: 38.3 % — ABNORMAL LOW (ref 43.0–77.0)
Platelets: 227 10*3/uL (ref 150.0–400.0)
RBC: 3.93 Mil/uL (ref 3.87–5.11)
RDW: 16.6 % — ABNORMAL HIGH (ref 11.5–15.5)
WBC: 7.9 10*3/uL (ref 4.0–10.5)

## 2015-03-09 LAB — BASIC METABOLIC PANEL
BUN: 15 mg/dL (ref 6–23)
CALCIUM: 8.9 mg/dL (ref 8.4–10.5)
CO2: 30 mEq/L (ref 19–32)
CREATININE: 0.82 mg/dL (ref 0.40–1.20)
Chloride: 102 mEq/L (ref 96–112)
GFR: 91.14 mL/min (ref >60.00)
Glucose, Bld: 111 mg/dL — ABNORMAL HIGH (ref 70–99)
Potassium: 3.4 mEq/L — ABNORMAL LOW (ref 3.5–5.1)
SODIUM: 139 meq/L (ref 135–145)

## 2015-03-09 LAB — HEMOGLOBIN A1C: Hgb A1c MFr Bld: 7.6 % — ABNORMAL HIGH (ref 4.6–6.5)

## 2015-03-09 LAB — VITAMIN B12: VITAMIN B 12: 332 pg/mL (ref 211–911)

## 2015-03-09 LAB — TSH: TSH: 1.5 u[IU]/mL (ref 0.35–4.50)

## 2015-03-09 LAB — CK: CK TOTAL: 84 U/L (ref 7–177)

## 2015-03-09 LAB — FOLATE: Folate: >23.8 ng/mL (ref >5.9)

## 2015-03-09 LAB — FERRITIN: FERRITIN: 14.8 ng/mL (ref 10.0–291.0)

## 2015-03-09 MED ORDER — GABAPENTIN 600 MG PO TABS: 600.0000 mg | ORAL_TABLET | Freq: Three times a day (TID) | ORAL | Status: DC

## 2015-03-09 MED ORDER — POTASSIUM CHLORIDE CRYS ER 20 MEQ PO TBCR: 20.0000 meq | EXTENDED_RELEASE_TABLET | Freq: Two times a day (BID) | ORAL | Status: DC

## 2015-03-09 MED ORDER — CRESTOR 20 MG PO TABS: 20.0000 mg | ORAL_TABLET | Freq: Every day | ORAL | Status: DC

## 2015-03-09 MED ORDER — CANAGLIFLOZIN 100 MG PO TABS: 100.0000 mg | ORAL_TABLET | Freq: Every day | ORAL | Status: DC

## 2015-03-09 NOTE — Patient Instructions (Signed)

## 2015-03-09 NOTE — Progress Notes (Signed)
Pre visit review using our clinic review tool, if applicable. No additional management support is needed unless otherwise documented below in the visit note. 

## 2015-03-09 NOTE — Progress Notes (Signed)
Subjective:  Patient ID: Emily Wheeler, female    DOB: 07/31/1955  Age: 59 y.o. MRN: [id number]  CC: Hypertension; Hyperlipidemia; and Diabetes   HPI Emily Wheeler presents for follow up on DM2. She offers no complaints today.  Outpatient Prescriptions Prior to Visit  Medication Sig Dispense Refill  . albuterol (PROAIR HFA) 108 (90 BASE) MCG/ACT inhaler Inhale 1-2 puffs into the lungs every 6 (six) hours as needed for wheezing or shortness of breath. 18 g 3  . aspirin EC 81 MG tablet Take 1 tablet (81 mg total) by mouth daily. 90 tablet 3  . Calcium Carb-Cholecalciferol (CALCIUM 600 + D PO) Take 1 tablet by mouth daily.    . carvedilol (COREG) 6.25 MG tablet Take 1 tablet (6.25 mg total) by mouth 2 (two) times daily with a meal. 180 tablet 3  . Chromium-Cinnamon [phone] MCG-MG CAPS Take 1 tablet by mouth daily.     . citalopram (CELEXA) 20 MG tablet TAKE ONE TABLET BY MOUTH ONCE DAILY 30 tablet 11  . Glycerin-Hypromellose-PEG 400 (VISINE TEARS OP) Place 1 drop into both eyes daily as needed (for dry eyes).    . hydrochlorothiazide (HYDRODIURIL) 25 MG tablet TAKE ONE TABLET BY MOUTH ONCE DAILY 90 tablet 0  . losartan (COZAAR) 100 MG tablet Take 1 tablet (100 mg total) by mouth daily. 90 tablet 3  . metFORMIN (GLUCOPHAGE) 500 MG tablet Take 1 tablet (500 mg total) by mouth 2 (two) times daily with a meal. 60 tablet 5  . Multiple Vitamin (MULTIVITAMIN) tablet Take 1 tablet by mouth daily.    . Omega-3 Fatty Acids (FISH OIL) 1200 MG CAPS Take 1,200 mg by mouth daily.     . predniSONE (DELTASONE) 10 MG tablet Take 10 mg by mouth daily.    . vitamin C (ASCORBIC ACID) 500 MG tablet Take 500 mg by mouth daily as needed (for cold symptoms).    . CRESTOR 20 MG tablet TAKE ONE TABLET BY MOUTH ONCE DAILY 30 tablet 0  . gabapentin (NEURONTIN) 600 MG tablet TAKE ONE TABLET BY MOUTH THREE TIMES DAILY 270 tablet 3   No facility-administered medications prior to visit.    ROS Review of  Systems  Constitutional: Negative.  Negative for fever, chills, diaphoresis, appetite change and fatigue.  HENT: Negative.  Negative for sore throat.   Eyes: Negative.  Negative for photophobia and visual disturbance.  Respiratory: Negative.  Negative for cough, choking, chest tightness, shortness of breath and stridor.   Cardiovascular: Negative.  Negative for chest pain, palpitations and leg swelling.  Gastrointestinal: Negative.  Negative for nausea, vomiting, abdominal pain, constipation and blood in stool.  Endocrine: Negative.  Negative for polydipsia, polyphagia and polyuria.  Genitourinary: Negative.  Negative for dysuria, urgency, frequency, enuresis and difficulty urinating.  Musculoskeletal: Positive for back pain. Negative for myalgias, joint swelling and arthralgias.  Skin: Negative.   Allergic/Immunologic: Negative.   Neurological: Negative.  Negative for dizziness.  Hematological: Negative.  Negative for adenopathy. Does not bruise/bleed easily.  Psychiatric/Behavioral: Negative.     Objective:  BP 124/80 mmHg  Pulse 57  Temp(Src) 98.1 F (36.7 C) (Oral)  Ht 5\' 4"  (1.626 m)  Wt 220 lb (99.791 kg)  BMI 37.74 kg/m2  SpO2 97%  LMP 05/29/1991  BP Readings from Last 3 Encounters:  03/09/15 124/80  02/17/15 142/80  10/28/14 122/83    Wt Readings from Last 3 Encounters:  03/09/15 220 lb (99.791 kg)  02/17/15 219 lb 3.2 oz (99.428 kg)  10/28/14 219 lb (99.338 kg)    Physical Exam  Constitutional: She is oriented to person, place, and time. She appears well-developed and well-nourished. No distress.  HENT:  Mouth/Throat: Oropharynx is clear and moist. No oropharyngeal exudate.  Eyes: Conjunctivae are normal. Right eye exhibits no discharge. Left eye exhibits no discharge. No scleral icterus.  Neck: Normal range of motion. Neck supple. No JVD present. No tracheal deviation present. No thyromegaly present.  Cardiovascular: Normal rate, regular rhythm, normal heart  sounds and intact distal pulses.  Exam reveals no gallop and no friction rub.   No murmur heard. Pulmonary/Chest: Effort normal and breath sounds normal. No stridor. No respiratory distress. She has no wheezes. She has no rales. She exhibits no tenderness.  Abdominal: Soft. Bowel sounds are normal. She exhibits no distension and no mass. There is no tenderness. There is no rebound and no guarding.  Musculoskeletal: Normal range of motion. She exhibits no edema or tenderness.  Lymphadenopathy:    She has no cervical adenopathy.  Neurological: She is oriented to person, place, and time.  Skin: Skin is warm and dry. No rash noted. She is not diaphoretic. No erythema. No pallor.  Psychiatric: She has a normal mood and affect. Her behavior is normal. Judgment and thought content normal.  Vitals reviewed.   Lab Results  Component Value Date   WBC 7.9 03/09/2015   HGB 11.9* 03/09/2015   HCT 36.9 03/09/2015   PLT 227.0 03/09/2015   GLUCOSE 111* 03/09/2015   CHOL 167 02/17/2015   TRIG 140.0 02/17/2015   HDL 61.80 02/17/2015   LDLDIRECT 162.5 01/30/2013   LDLCALC 77 02/17/2015   ALT 20 02/17/2015   AST 11 02/17/2015   NA 139 03/09/2015   K 3.4* 03/09/2015   CL 102 03/09/2015   CREATININE 0.82 03/09/2015   BUN 15 03/09/2015   CO2 30 03/09/2015   TSH 1.50 03/09/2015   INR 0.9 10/26/2014   HGBA1C 7.6* 03/09/2015   MICROALBUR 2.2* 07/08/2014    No results found.  Assessment & Plan:   Emily Wheeler was seen today for hypertension, hyperlipidemia and diabetes.  Diagnoses and all orders for this visit:  Type 2 diabetes mellitus with complication, without long-term current use of insulin (HCC)- Her A1C is up to 7.6%, I have asked her to add an SGLT-2 I to her medical regimen -     CRESTOR 20 MG tablet; Take 1 tablet (20 mg total) by mouth daily. -     Basic metabolic panel; Future -     Hemoglobin A1c; Future -     Ambulatory referral to Ophthalmology -     canagliflozin (INVOKANA) 100 MG  TABS tablet; Take 1 tablet (100 mg total) by mouth daily.  Essential hypertension, benign- her BP is well controlled but her K+ level is slightly low so I have asked her to start K+ replacement therapy -     Basic metabolic panel; Future -     potassium chloride SA (K-DUR,KLOR-CON) 20 MEQ tablet; Take 1 tablet (20 mEq total) by mouth 2 (two) times daily.  Cardiomyopathy (HCC)  Pernicious anemia- improvement noted, her vitamin levels are normal, she may have some degree of anemia of chronic disease -     CBC with Differential/Platelet; Future -     Folate; Future -     Ferritin; Future -     IBC panel; Future -     Vitamin B12; Future  Hypokalemia- will start a K+ supplement -  Basic metabolic panel; Future -     potassium chloride SA (K-DUR,KLOR-CON) 20 MEQ tablet; Take 1 tablet (20 mEq total) by mouth 2 (two) times daily.  Spinal stenosis of lumbar region -     gabapentin (NEURONTIN) 600 MG tablet; Take 1 tablet (600 mg total) by mouth 3 (three) times daily.  Hyperlipidemia with target LDL less than 100- she has achieved her LDL goal -     CRESTOR 20 MG tablet; Take 1 tablet (20 mg total) by mouth daily. -     TSH; Future -     CK; Future  History of colonic polyps -     Ambulatory referral to Gastroenterology  Need for influenza vaccination -     Flu Vaccine QUAD 36+ mos IM   I have changed Emily Wheeler's gabapentin and CRESTOR. I am also having her start on canagliflozin and potassium chloride SA. Additionally, I am having her maintain her Chromium-Cinnamon, multivitamin, Fish Oil, albuterol, carvedilol, aspirin EC, Calcium Carb-Cholecalciferol (CALCIUM 600 + D PO), vitamin C, Glycerin-Hypromellose-PEG 400 (VISINE TEARS OP), metFORMIN, hydrochlorothiazide, citalopram, predniSONE, losartan, and leflunomide.  Meds ordered this encounter  Medications  . leflunomide (ARAVA) 20 MG tablet    Sig:   . gabapentin (NEURONTIN) 600 MG tablet    Sig: Take 1 tablet (600 mg total) by  mouth 3 (three) times daily.    Dispense:  270 tablet    Refill:  3  . CRESTOR 20 MG tablet    Sig: Take 1 tablet (20 mg total) by mouth daily.    Dispense:  30 tablet    Refill:  11  . canagliflozin (INVOKANA) 100 MG TABS tablet    Sig: Take 1 tablet (100 mg total) by mouth daily.    Dispense:  30 tablet    Refill:  11  . potassium chloride SA (K-DUR,KLOR-CON) 20 MEQ tablet    Sig: Take 1 tablet (20 mEq total) by mouth 2 (two) times daily.    Dispense:  60 tablet    Refill:  5     Follow-up: Return in about 4 months (around 07/10/2015).  Sanda Linger, MD

## 2015-03-23 LAB — HM DIABETES EYE EXAM

## 2015-03-28 ENCOUNTER — Other Ambulatory Visit: Payer: Self-pay | Admitting: Internal Medicine

## 2015-04-07 DIAGNOSIS — Z79899 Other long term (current) drug therapy: Secondary | ICD-10-CM | POA: Diagnosis not present

## 2015-04-07 DIAGNOSIS — M069 Rheumatoid arthritis, unspecified: Secondary | ICD-10-CM | POA: Diagnosis not present

## 2015-04-27 DIAGNOSIS — H2513 Age-related nuclear cataract, bilateral: Secondary | ICD-10-CM | POA: Diagnosis not present

## 2015-04-27 DIAGNOSIS — H1851 Endothelial corneal dystrophy: Secondary | ICD-10-CM | POA: Diagnosis not present

## 2015-04-27 DIAGNOSIS — E119 Type 2 diabetes mellitus without complications: Secondary | ICD-10-CM | POA: Diagnosis not present

## 2015-05-09 ENCOUNTER — Telehealth: Payer: Self-pay | Admitting: *Deleted

## 2015-05-09 DIAGNOSIS — M069 Rheumatoid arthritis, unspecified: Secondary | ICD-10-CM

## 2015-05-09 NOTE — Telephone Encounter (Signed)
Left msg on triage stating she is needing refill on her rheumatoid arthritis medication Dr. Reeves Dam will not refill unless she has an appt & pt states she can not afford the specialist copay. Wanting to ask Dr. Yetta Barre will he ok refill...Raechel Chute

## 2015-05-10 NOTE — Telephone Encounter (Signed)
Pls advise on msg below.../lmb 

## 2015-05-11 MED ORDER — LEFLUNOMIDE 20 MG PO TABS: 20.0000 mg | ORAL_TABLET | Freq: Every day | ORAL | Status: DC

## 2015-05-11 NOTE — Telephone Encounter (Signed)
Pt called to check up on this request. Please advise, she is out of this med for couple days now.

## 2015-05-11 NOTE — Telephone Encounter (Signed)
Rx sent to [facility]. 

## 2015-05-11 NOTE — Telephone Encounter (Signed)
Notified pt md sent to [facility]..../lmb 

## 2015-05-11 NOTE — Telephone Encounter (Signed)
Its the Arava...Emily Wheeler

## 2015-05-11 NOTE — Telephone Encounter (Signed)
Which med?

## 2015-05-14 ENCOUNTER — Other Ambulatory Visit: Payer: Self-pay | Admitting: Interventional Cardiology

## 2015-05-25 ENCOUNTER — Ambulatory Visit: Payer: Commercial Managed Care - HMO | Admitting: Cardiology

## 2015-05-25 ENCOUNTER — Other Ambulatory Visit: Payer: Self-pay | Admitting: Internal Medicine

## 2015-07-14 ENCOUNTER — Other Ambulatory Visit: Payer: Self-pay | Admitting: General Practice

## 2015-07-14 MED ORDER — METFORMIN HCL 500 MG PO TABS: 500.0000 mg | ORAL_TABLET | Freq: Two times a day (BID) | ORAL | Status: DC

## 2015-07-21 ENCOUNTER — Telehealth: Payer: Self-pay | Admitting: Internal Medicine

## 2015-07-21 DIAGNOSIS — E785 Hyperlipidemia, unspecified: Secondary | ICD-10-CM

## 2015-07-21 DIAGNOSIS — E118 Type 2 diabetes mellitus with unspecified complications: Secondary | ICD-10-CM

## 2015-07-21 MED ORDER — ATORVASTATIN CALCIUM 40 MG PO TABS: 40.0000 mg | ORAL_TABLET | Freq: Every day | ORAL | Status: DC

## 2015-07-21 NOTE — Telephone Encounter (Signed)
Patient called to advise that her CRESTOR 20 MG tablet [[id number]] is not covered by insurance any longer. She is asking that a generic be called in or an alternative. Pharmacy is [facility] on [address].

## 2015-07-21 NOTE — Telephone Encounter (Signed)
done

## 2015-08-01 ENCOUNTER — Telehealth: Payer: Self-pay | Admitting: Internal Medicine

## 2015-08-01 DIAGNOSIS — M069 Rheumatoid arthritis, unspecified: Secondary | ICD-10-CM

## 2015-08-01 DIAGNOSIS — I10 Essential (primary) hypertension: Secondary | ICD-10-CM

## 2015-08-01 MED ORDER — CARVEDILOL 6.25 MG PO TABS: 6.2500 mg | ORAL_TABLET | Freq: Two times a day (BID) | ORAL | Status: DC

## 2015-08-01 MED ORDER — LEFLUNOMIDE 20 MG PO TABS: 20.0000 mg | ORAL_TABLET | Freq: Every day | ORAL | Status: DC

## 2015-08-01 NOTE — Telephone Encounter (Signed)
30 day supply sent must keep appt for further fills

## 2015-08-01 NOTE — Telephone Encounter (Signed)
Pt is needing refills for leflunomide (ARAVA) 20 MG tablet [[id number] and carvedilol (COREG) 6.25 MG tablet [[id number] Pharmacy is [facility]  I did get her scheduled for next week

## 2015-08-08 ENCOUNTER — Encounter: Payer: Self-pay | Admitting: Internal Medicine

## 2015-08-08 ENCOUNTER — Other Ambulatory Visit (INDEPENDENT_AMBULATORY_CARE_PROVIDER_SITE_OTHER): Payer: Commercial Managed Care - HMO

## 2015-08-08 ENCOUNTER — Ambulatory Visit (INDEPENDENT_AMBULATORY_CARE_PROVIDER_SITE_OTHER): Payer: Commercial Managed Care - HMO | Admitting: Internal Medicine

## 2015-08-08 VITALS — BP 130/82 | HR 59 | Temp 98.2°F | Resp 18 | Ht 64.0 in | Wt 214.0 lb

## 2015-08-08 DIAGNOSIS — D510 Vitamin B12 deficiency anemia due to intrinsic factor deficiency: Secondary | ICD-10-CM

## 2015-08-08 DIAGNOSIS — I10 Essential (primary) hypertension: Secondary | ICD-10-CM

## 2015-08-08 DIAGNOSIS — Z86010 Personal history of colonic polyps: Secondary | ICD-10-CM

## 2015-08-08 DIAGNOSIS — E118 Type 2 diabetes mellitus with unspecified complications: Secondary | ICD-10-CM

## 2015-08-08 DIAGNOSIS — E876 Hypokalemia: Secondary | ICD-10-CM | POA: Diagnosis not present

## 2015-08-08 DIAGNOSIS — Z87898 Personal history of other specified conditions: Secondary | ICD-10-CM

## 2015-08-08 DIAGNOSIS — M069 Rheumatoid arthritis, unspecified: Secondary | ICD-10-CM | POA: Diagnosis not present

## 2015-08-08 LAB — CBC WITH DIFFERENTIAL/PLATELET
BASOS ABS: 0 10*3/uL (ref 0.0–0.1)
Basophils Relative: 0.6 % (ref 0.0–3.0)
EOS ABS: 0.2 10*3/uL (ref 0.0–0.7)
Eosinophils Relative: 3.6 % (ref 0.0–5.0)
HCT: 37.6 % (ref 36.0–46.0)
Hemoglobin: 12.3 g/dL (ref 12.0–15.0)
LYMPHS ABS: 2.6 10*3/uL (ref 0.7–4.0)
Lymphocytes Relative: 45.4 % (ref 12.0–46.0)
MCHC: 32.8 g/dL (ref 30.0–36.0)
MCV: 92 fl (ref 78.0–100.0)
MONO ABS: 0.7 10*3/uL (ref 0.1–1.0)
MONOS PCT: 12.3 % — AB (ref 3.0–12.0)
NEUTROS PCT: 38.1 % — AB (ref 43.0–77.0)
Neutro Abs: 2.2 10*3/uL (ref 1.4–7.7)
Platelets: 267 10*3/uL (ref 150.0–400.0)
RBC: 4.09 Mil/uL (ref 3.87–5.11)
RDW: 15.7 % — ABNORMAL HIGH (ref 11.5–15.5)
WBC: 5.8 10*3/uL (ref 4.0–10.5)

## 2015-08-08 LAB — BASIC METABOLIC PANEL
BUN: 15 mg/dL (ref 6–23)
CALCIUM: 9.1 mg/dL (ref 8.4–10.5)
CO2: 28 mEq/L (ref 19–32)
Chloride: 104 mEq/L (ref 96–112)
Creatinine, Ser: 0.87 mg/dL (ref 0.40–1.20)
GFR: 85 mL/min (ref >60.00)
GLUCOSE: 106 mg/dL — AB (ref 70–99)
Potassium: 3.7 mEq/L (ref 3.5–5.1)
SODIUM: 141 meq/L (ref 135–145)

## 2015-08-08 LAB — URINALYSIS, ROUTINE W REFLEX MICROSCOPIC
Bilirubin Urine: NEGATIVE
Hgb urine dipstick: NEGATIVE
KETONES UR: NEGATIVE
Leukocytes, UA: NEGATIVE
Nitrite: NEGATIVE
PH: 7 (ref 5.0–8.0)
RBC / HPF: NONE SEEN (ref 0–?)
SPECIFIC GRAVITY, URINE: 1.01 (ref 1.000–1.030)
Total Protein, Urine: NEGATIVE
Urobilinogen, UA: 0.2 (ref 0.0–1.0)

## 2015-08-08 LAB — MICROALBUMIN / CREATININE URINE RATIO
Creatinine,U: 51.4 mg/dL
MICROALB UR: 0.8 mg/dL (ref 0.0–1.9)
MICROALB/CREAT RATIO: 1.6 mg/g (ref 0.0–30.0)

## 2015-08-08 LAB — HEMOGLOBIN A1C: Hgb A1c MFr Bld: 6.8 % — ABNORMAL HIGH (ref 4.6–6.5)

## 2015-08-08 LAB — MAGNESIUM: MAGNESIUM: 1.8 mg/dL (ref 1.5–2.5)

## 2015-08-08 MED ORDER — LEFLUNOMIDE 20 MG PO TABS: 20.0000 mg | ORAL_TABLET | Freq: Every day | ORAL | Status: DC

## 2015-08-08 MED ORDER — CARVEDILOL 6.25 MG PO TABS: 6.2500 mg | ORAL_TABLET | Freq: Two times a day (BID) | ORAL | Status: DC

## 2015-08-08 NOTE — Progress Notes (Signed)
Subjective:  Patient ID: Emily Wheeler, female    DOB: 12-28-1955  Age: 60 y.o. MRN: [id number]  CC: Anemia; Hypertension; and Diabetes   HPI Emily Wheeler presents for f/up on several medical concerns.  She needs a refill on Arava for rheumatoid arthritis. She has mild aching in both knees and hips but says it's improving since she restarted Arava.  She tells me her blood sugars have been well controlled and she has improved on her lifestyle modifications. She has lost about 8 pounds since I last saw her. She tells me that her blood sugars are usually in the 110-139 range. She denies polyuria, polydipsia or polyphagia.  She also tells me that her blood pressures been well controlled on the combination of carvedilol, hydrochlorothiazide, and losartan. She denies any episodes of chest pain, shortness of breath, palpates patients, or dizziness.  Outpatient Prescriptions Prior to Visit  Medication Sig Dispense Refill  . albuterol (PROAIR HFA) 108 (90 BASE) MCG/ACT inhaler Inhale 1-2 puffs into the lungs every 6 (six) hours as needed for wheezing or shortness of breath. 18 g 3  . aspirin EC 81 MG tablet Take 1 tablet (81 mg total) by mouth daily. 90 tablet 3  . atorvastatin (LIPITOR) 40 MG tablet Take 1 tablet (40 mg total) by mouth daily. 90 tablet 3  . Calcium Carb-Cholecalciferol (CALCIUM 600 + D PO) Take 1 tablet by mouth daily.    . canagliflozin (INVOKANA) 100 MG TABS tablet Take 1 tablet (100 mg total) by mouth daily. 30 tablet 11  . Chromium-Cinnamon [phone] MCG-MG CAPS Take 1 tablet by mouth daily.     . citalopram (CELEXA) 20 MG tablet TAKE ONE TABLET BY MOUTH ONCE DAILY 30 tablet 11  . gabapentin (NEURONTIN) 600 MG tablet Take 1 tablet (600 mg total) by mouth 3 (three) times daily. 270 tablet 3  . Glycerin-Hypromellose-PEG 400 (VISINE TEARS OP) Place 1 drop into both eyes daily as needed (for dry eyes).    . hydrochlorothiazide (HYDRODIURIL) 25 MG tablet TAKE ONE TABLET BY  MOUTH ONCE DAILY 90 tablet 1  . losartan (COZAAR) 100 MG tablet Take 1 tablet (100 mg total) by mouth daily. 90 tablet 3  . metFORMIN (GLUCOPHAGE) 500 MG tablet Take 1 tablet (500 mg total) by mouth 2 (two) times daily with a meal. 180 tablet 0  . Multiple Vitamin (MULTIVITAMIN) tablet Take 1 tablet by mouth daily.    . Omega-3 Fatty Acids (FISH OIL) 1200 MG CAPS Take 1,200 mg by mouth daily.     . potassium chloride SA (K-DUR,KLOR-CON) 20 MEQ tablet Take 1 tablet (20 mEq total) by mouth 2 (two) times daily. 60 tablet 5  . vitamin C (ASCORBIC ACID) 500 MG tablet Take 500 mg by mouth daily as needed (for cold symptoms).    . carvedilol (COREG) 6.25 MG tablet Take 1 tablet (6.25 mg total) by mouth 2 (two) times daily with a meal. 60 tablet 0  . leflunomide (ARAVA) 20 MG tablet Take 1 tablet (20 mg total) by mouth daily. 30 tablet 0  . predniSONE (DELTASONE) 10 MG tablet Take 10 mg by mouth daily.     No facility-administered medications prior to visit.    ROS Review of Systems  Constitutional: Negative.  Negative for fever, chills, diaphoresis, activity change, appetite change, fatigue and unexpected weight change.  HENT: Negative.  Negative for sinus pressure and sore throat.   Eyes: Negative.   Respiratory: Negative.  Negative for cough, choking, chest tightness,  shortness of breath and stridor.   Cardiovascular: Negative.  Negative for chest pain, palpitations and leg swelling.  Gastrointestinal: Negative.  Negative for nausea, vomiting, abdominal pain, diarrhea and constipation.  Endocrine: Negative.  Negative for polydipsia, polyphagia and polyuria.  Genitourinary: Negative.  Negative for difficulty urinating.  Musculoskeletal: Positive for arthralgias. Negative for myalgias, back pain, joint swelling and neck pain.  Skin: Negative.  Negative for color change and rash.  Allergic/Immunologic: Negative.   Neurological: Negative.  Negative for dizziness, tremors, weakness,  light-headedness, numbness and headaches.  Hematological: Negative.  Negative for adenopathy. Does not bruise/bleed easily.  Psychiatric/Behavioral: Negative.     Objective:  BP 130/82 mmHg  Pulse 59  Temp(Src) 98.2 F (36.8 C) (Oral)  Resp 18  Ht 5\' 4"  (1.626 m)  Wt 214 lb (97.07 kg)  BMI 36.72 kg/m2  SpO2 98%  LMP 05/29/1991  BP Readings from Last 3 Encounters:  08/08/15 130/82  03/09/15 124/80  02/17/15 142/80    Wt Readings from Last 3 Encounters:  08/08/15 214 lb (97.07 kg)  03/09/15 220 lb (99.791 kg)  02/17/15 219 lb 3.2 oz (99.428 kg)    Physical Exam  Constitutional: She is oriented to person, place, and time. No distress.  HENT:  Head: Normocephalic and atraumatic.  Mouth/Throat: Oropharynx is clear and moist. No oropharyngeal exudate.  Eyes: Conjunctivae are normal. Right eye exhibits no discharge. Left eye exhibits no discharge. No scleral icterus.  Neck: Normal range of motion. Neck supple. No JVD present. No tracheal deviation present. No thyromegaly present.  Cardiovascular: Normal rate, regular rhythm, normal heart sounds and intact distal pulses.  Exam reveals no gallop and no friction rub.   No murmur heard. Pulmonary/Chest: Effort normal and breath sounds normal. No stridor. No respiratory distress. She has no wheezes. She has no rales. She exhibits no tenderness.  Abdominal: Soft. Bowel sounds are normal. She exhibits no distension and no mass. There is no tenderness. There is no rebound and no guarding.  Musculoskeletal: Normal range of motion. She exhibits no edema or tenderness.  Lymphadenopathy:    She has no cervical adenopathy.  Neurological: She is oriented to person, place, and time.  Skin: Skin is warm and dry. No rash noted. She is not diaphoretic. No erythema. No pallor.  Psychiatric: She has a normal mood and affect. Her behavior is normal. Judgment and thought content normal.  Vitals reviewed.   Lab Results  Component Value Date    WBC 5.8 08/08/2015   HGB 12.3 08/08/2015   HCT 37.6 08/08/2015   PLT 267.0 08/08/2015   GLUCOSE 106* 08/08/2015   CHOL 167 02/17/2015   TRIG 140.0 02/17/2015   HDL 61.80 02/17/2015   LDLDIRECT 162.5 01/30/2013   LDLCALC 77 02/17/2015   ALT 20 02/17/2015   AST 11 02/17/2015   NA 141 08/08/2015   K 3.7 08/08/2015   CL 104 08/08/2015   CREATININE 0.87 08/08/2015   BUN 15 08/08/2015   CO2 28 08/08/2015   TSH 1.50 03/09/2015   INR 0.9 10/26/2014   HGBA1C 6.8* 08/08/2015   MICROALBUR 0.8 08/08/2015    No results found.  Assessment & Plan:   Emily Wheeler was seen today for anemia, hypertension and diabetes.  Diagnoses and all orders for this visit:  Rheumatoid arthritis involving multiple joints (HCC) -     leflunomide (ARAVA) 20 MG tablet; Take 1 tablet (20 mg total) by mouth daily.  Type 2 diabetes mellitus with complication, without long-term current use of insulin (HCC)-  Her A1c is down to 6.8%, her blood sugars are well-controlled. She will continue taking metformin and info,. -     Basic metabolic panel; Future -     Microalbumin / creatinine urine ratio; Future -     Hemoglobin A1c; Future  Essential hypertension, benign- her blood pressures well controlled and electrolytes and renal function are stable. -     carvedilol (COREG) 6.25 MG tablet; Take 1 tablet (6.25 mg total) by mouth 2 (two) times daily with a meal. -     Urinalysis, Routine w reflex microscopic (not at [facility]); Future  Hypokalemia- improvement noted -     Basic metabolic panel; Future -     Magnesium; Future  Pernicious anemia- her hemoglobin and hematocrit are normal today, this has resolved. -     CBC with Differential/Platelet; Future  Personal history of colonic polyps -     Ambulatory referral to Gastroenterology  Hx of abnormal mammogram -     MM DIGITAL SCREENING BILATERAL; Future   I have discontinued Ms. Abaya's predniSONE. I am also having her maintain her Chromium-Cinnamon, multivitamin,  Fish Oil, albuterol, aspirin EC, Calcium Carb-Cholecalciferol (CALCIUM 600 + D PO), vitamin C, Glycerin-Hypromellose-PEG 400 (VISINE TEARS OP), citalopram, losartan, gabapentin, canagliflozin, potassium chloride SA, hydrochlorothiazide, metFORMIN, atorvastatin, leflunomide, and carvedilol.  Meds ordered this encounter  Medications  . leflunomide (ARAVA) 20 MG tablet    Sig: Take 1 tablet (20 mg total) by mouth daily.    Dispense:  90 tablet    Refill:  1  . carvedilol (COREG) 6.25 MG tablet    Sig: Take 1 tablet (6.25 mg total) by mouth 2 (two) times daily with a meal.    Dispense:  180 tablet    Refill:  1     Follow-up: Return in about 6 months (around 02/08/2016).  Sanda Linger, MD

## 2015-08-08 NOTE — Patient Instructions (Signed)

## 2015-08-08 NOTE — Progress Notes (Signed)
Pre visit review using our clinic review tool, if applicable. No additional management support is needed unless otherwise documented below in the visit note. 

## 2015-08-09 ENCOUNTER — Encounter: Payer: Self-pay | Admitting: Gastroenterology

## 2015-08-09 ENCOUNTER — Other Ambulatory Visit: Payer: Self-pay | Admitting: Internal Medicine

## 2015-08-09 DIAGNOSIS — Z1231 Encounter for screening mammogram for malignant neoplasm of breast: Secondary | ICD-10-CM

## 2015-09-15 ENCOUNTER — Other Ambulatory Visit: Payer: Self-pay | Admitting: Internal Medicine

## 2015-09-20 ENCOUNTER — Ambulatory Visit
Admission: RE | Admit: 2015-09-20 | Discharge: 2015-09-20 | Disposition: A | Discharge status: Home / Self Care | Payer: Commercial Managed Care - HMO | Source: Ambulatory Visit | Attending: Internal Medicine | Admitting: Internal Medicine

## 2015-09-20 DIAGNOSIS — Z1231 Encounter for screening mammogram for malignant neoplasm of breast: Secondary | ICD-10-CM

## 2015-09-20 LAB — HM MAMMOGRAPHY

## 2015-09-20 NOTE — Addendum Note (Signed)
Addended by: [name] on: 09/20/2015 05:05 PM   Modules accepted: Kipp Brood

## 2015-09-26 ENCOUNTER — Encounter: Payer: Self-pay | Admitting: Gastroenterology

## 2015-09-26 ENCOUNTER — Ambulatory Visit (AMBULATORY_SURGERY_CENTER): Payer: Self-pay | Admitting: *Deleted

## 2015-09-26 VITALS — Ht 65.0 in | Wt 218.0 lb

## 2015-09-26 DIAGNOSIS — Z1211 Encounter for screening for malignant neoplasm of colon: Secondary | ICD-10-CM

## 2015-09-26 MED ORDER — NA SULFATE-K SULFATE-MG SULF 17.5-3.13-1.6 GM/177ML PO SOLN: 1.0000 | Freq: Once | ORAL | Status: DC

## 2015-09-26 NOTE — Progress Notes (Signed)
No egg or soy allergy known to patient  No issues with past sedation with any surgeries  or procedures, no intubation problems  No diet pills per patient No home 02 use per patient  No blood thinners per patient  Pt denies issues with constipation   

## 2015-10-10 ENCOUNTER — Ambulatory Visit (AMBULATORY_SURGERY_CENTER): Payer: Commercial Managed Care - HMO | Admitting: Gastroenterology

## 2015-10-10 ENCOUNTER — Encounter: Payer: Self-pay | Admitting: Gastroenterology

## 2015-10-10 VITALS — BP 141/88 | HR 59 | Temp 97.5°F | Resp 16 | Ht 65.0 in | Wt 218.0 lb

## 2015-10-10 DIAGNOSIS — I509 Heart failure, unspecified: Secondary | ICD-10-CM | POA: Diagnosis not present

## 2015-10-10 DIAGNOSIS — Z1211 Encounter for screening for malignant neoplasm of colon: Secondary | ICD-10-CM | POA: Diagnosis not present

## 2015-10-10 DIAGNOSIS — J45909 Unspecified asthma, uncomplicated: Secondary | ICD-10-CM | POA: Diagnosis not present

## 2015-10-10 DIAGNOSIS — M069 Rheumatoid arthritis, unspecified: Secondary | ICD-10-CM | POA: Diagnosis not present

## 2015-10-10 DIAGNOSIS — I10 Essential (primary) hypertension: Secondary | ICD-10-CM | POA: Diagnosis not present

## 2015-10-10 DIAGNOSIS — E119 Type 2 diabetes mellitus without complications: Secondary | ICD-10-CM | POA: Diagnosis not present

## 2015-10-10 DIAGNOSIS — F329 Major depressive disorder, single episode, unspecified: Secondary | ICD-10-CM | POA: Diagnosis not present

## 2015-10-10 LAB — GLUCOSE, CAPILLARY
GLUCOSE-CAPILLARY: 137 mg/dL — AB (ref 65–99)
Glucose-Capillary: 126 mg/dL — ABNORMAL HIGH (ref 65–99)

## 2015-10-10 LAB — HM COLONOSCOPY

## 2015-10-10 MED ORDER — SODIUM CHLORIDE 0.9 % IV SOLN: 500.0000 mL | INTRAVENOUS | Status: DC

## 2015-10-10 NOTE — Patient Instructions (Signed)
Discharge instructions given. Handouts on hemorrhoids and a high fiber diet. Resume previous medications. YOU HAD AN ENDOSCOPIC PROCEDURE TODAY AT THE [facility]:   Refer to the procedure report that was given to you for any specific questions about what was found during the examination.  If the procedure report does not answer your questions, please call your gastroenterologist to clarify.  If you requested that your care partner not be given the details of your procedure findings, then the procedure report has been included in a sealed envelope for you to review at your convenience later.  YOU SHOULD EXPECT: Some feelings of bloating in the abdomen. Passage of more gas than usual.  Walking can help get rid of the air that was put into your GI tract during the procedure and reduce the bloating. If you had a lower endoscopy (such as a colonoscopy or flexible sigmoidoscopy) you may notice spotting of blood in your stool or on the toilet paper. If you underwent a bowel prep for your procedure, you may not have a normal bowel movement for a few days.  Please Note:  You might notice some irritation and congestion in your nose or some drainage.  This is from the oxygen used during your procedure.  There is no need for concern and it should clear up in a day or so.  SYMPTOMS TO REPORT IMMEDIATELY:   Following lower endoscopy (colonoscopy or flexible sigmoidoscopy):  Excessive amounts of blood in the stool  Significant tenderness or worsening of abdominal pains  Swelling of the abdomen that is new, acute  Fever of 100F or higher   For urgent or emergent issues, a gastroenterologist can be reached at any hour by calling [phone].   DIET: Your first meal following the procedure should be a small meal and then it is ok to progress to your normal diet. Heavy or fried foods are harder to digest and may make you feel nauseous or bloated.  Likewise, meals heavy in dairy and vegetables  can increase bloating.  Drink plenty of fluids but you should avoid alcoholic beverages for 24 hours.  ACTIVITY:  You should plan to take it easy for the rest of today and you should NOT DRIVE or use heavy machinery until tomorrow (because of the sedation medicines used during the test).    FOLLOW UP: Our staff will call the number listed on your records the next business day following your procedure to check on you and address any questions or concerns that you may have regarding the information given to you following your procedure. If we do not reach you, we will leave a message.  However, if you are feeling well and you are not experiencing any problems, there is no need to return our call.  We will assume that you have returned to your regular daily activities without incident.  If any biopsies were taken you will be contacted by phone or by letter within the next 1-3 weeks.  Please call us at [phone] if you have not heard about the biopsies in 3 weeks.    SIGNATURES/CONFIDENTIALITY: You and/or your care partner have signed paperwork which will be entered into your electronic medical record.  These signatures attest to the fact that that the information above on your After Visit Summary has been reviewed and is understood.  Full responsibility of the confidentiality of this discharge information lies with you and/or your care-partner. 

## 2015-10-10 NOTE — Progress Notes (Signed)
Report to PACU, RN, vss, BBS= Clear.  

## 2015-10-10 NOTE — Op Note (Signed)
[facility] Patient Name: Emily Wheeler Procedure Date: 10/10/2015 7:49 AM MRN: [id number] Endoscopist: Rachael Fee , MD Age: 60 Referring MD:  Date of Birth: 1955/11/14 Gender: Female Procedure:                Colonoscopy Indications:              Screening for colorectal malignant neoplasm Medicines:                Monitored Anesthesia Care Procedure:                Pre-Anesthesia Assessment:                           - Prior to the procedure, a History and Physical                            was performed, and patient medications and                            allergies were reviewed. The patient's tolerance of                            previous anesthesia was also reviewed. The risks                            and benefits of the procedure and the sedation                            options and risks were discussed with the patient.                            All questions were answered, and informed consent                            was obtained. Prior Anticoagulants: The patient has                            taken no previous anticoagulant or antiplatelet                            agents. ASA Grade Assessment: II - A patient with                            mild systemic disease. After reviewing the risks                            and benefits, the patient was deemed in                            satisfactory condition to undergo the procedure.                           After obtaining informed consent, the colonoscope  was passed under direct vision. Throughout the                            procedure, the patient's blood pressure, pulse, and                            oxygen saturations were monitored continuously. The                            Model CF-HQ190L [id number]) scope was introduced                            through the anus and advanced to the the cecum,                            identified by appendiceal orifice and  ileocecal                            valve. The colonoscopy was performed without                            difficulty. The patient tolerated the procedure                            well. The quality of the bowel preparation was                            excellent. The ileocecal valve, appendiceal                            orifice, and rectum were photographed. Scope In: 7:55:02 AM Scope Out: 8:02:07 AM Scope Withdrawal Time: 0 hours 5 minutes 49 seconds  Total Procedure Duration: 0 hours 7 minutes 5 seconds  Findings:                 External and internal hemorrhoids were found. The                            hemorrhoids were medium-sized.                           The appendiceal orifice was slightly inverted.                           The exam was otherwise without abnormality on                            direct and retroflexion views. Complications:            No immediate complications. Estimated blood loss:                            None. Estimated Blood Loss:     Estimated blood loss: none. Impression:               - External and internal hemorrhoids.                           -  The examination was otherwise normal on direct                            and retroflexion views.                           - The appendiceal orifice was slightl inverted.                           - No specimens collected. Recommendation:           - Patient has a contact number available for                            emergencies. The signs and symptoms of potential                            delayed complications were discussed with the                            patient. Return to normal activities tomorrow.                            Written discharge instructions were provided to the                            patient.                           - Resume previous diet.                           - Continue present medications.                           - Repeat colonoscopy in 10 years for  screening                            purposes. There is no need for colon cancer                            screening by any method (including stool testing)                            prior to then. Rachael Fee, MD 10/10/2015 8:05:02 AM This report has been signed electronically.

## 2015-10-10 NOTE — Addendum Note (Signed)
Addended by: [name] on: 10/10/2015 09:14 AM   Modules accepted: Kipp Brood

## 2015-10-11 ENCOUNTER — Telehealth: Payer: Self-pay

## 2015-10-11 LAB — HM COLONOSCOPY

## 2015-10-11 NOTE — Telephone Encounter (Signed)
Left message on answering machine. 

## 2015-10-11 NOTE — Addendum Note (Signed)
Addended by: [name] on: 10/11/2015 04:13 PM   Modules accepted: Kipp Brood

## 2015-11-24 ENCOUNTER — Other Ambulatory Visit: Payer: Self-pay | Admitting: Internal Medicine

## 2015-12-28 ENCOUNTER — Other Ambulatory Visit: Payer: Self-pay | Admitting: Internal Medicine

## 2016-01-05 ENCOUNTER — Telehealth: Payer: Self-pay | Admitting: *Deleted

## 2016-01-05 MED ORDER — ALBUTEROL SULFATE HFA 108 (90 BASE) MCG/ACT IN AERS: 1.0000 | INHALATION_SPRAY | Freq: Four times a day (QID) | RESPIRATORY_TRACT | 11 refills | Status: DC | PRN

## 2016-01-05 MED ORDER — CITALOPRAM HYDROBROMIDE 20 MG PO TABS: 20.0000 mg | ORAL_TABLET | Freq: Every day | ORAL | 1 refills | Status: DC

## 2016-01-05 NOTE — Addendum Note (Signed)
Addended by: Deatra James on: 01/05/2016 12:16 PM   Modules accepted: Orders

## 2016-01-05 NOTE — Telephone Encounter (Signed)
Rec'd fax stating pt insurance plan does not cover  ProAir. Pls send script for Ventolin. Resent electronically...Emily Wheeler

## 2016-01-31 ENCOUNTER — Other Ambulatory Visit: Payer: Self-pay | Admitting: Internal Medicine

## 2016-02-01 ENCOUNTER — Other Ambulatory Visit: Payer: Self-pay | Admitting: Cardiology

## 2016-02-01 DIAGNOSIS — E785 Hyperlipidemia, unspecified: Secondary | ICD-10-CM

## 2016-02-01 DIAGNOSIS — I5021 Acute systolic (congestive) heart failure: Secondary | ICD-10-CM

## 2016-02-01 DIAGNOSIS — I429 Cardiomyopathy, unspecified: Secondary | ICD-10-CM

## 2016-02-01 DIAGNOSIS — I10 Essential (primary) hypertension: Secondary | ICD-10-CM

## 2016-02-02 ENCOUNTER — Other Ambulatory Visit: Payer: Self-pay | Admitting: Internal Medicine

## 2016-02-02 DIAGNOSIS — I10 Essential (primary) hypertension: Secondary | ICD-10-CM

## 2016-03-09 ENCOUNTER — Other Ambulatory Visit: Payer: Self-pay | Admitting: Internal Medicine

## 2016-03-09 DIAGNOSIS — E118 Type 2 diabetes mellitus with unspecified complications: Secondary | ICD-10-CM

## 2016-03-19 ENCOUNTER — Ambulatory Visit: Payer: Self-pay | Admitting: Internal Medicine

## 2016-03-26 ENCOUNTER — Encounter: Payer: Self-pay | Admitting: Cardiology

## 2016-03-27 ENCOUNTER — Other Ambulatory Visit: Payer: Self-pay | Admitting: Cardiology

## 2016-03-27 DIAGNOSIS — I10 Essential (primary) hypertension: Secondary | ICD-10-CM

## 2016-03-27 DIAGNOSIS — I429 Cardiomyopathy, unspecified: Secondary | ICD-10-CM

## 2016-03-27 DIAGNOSIS — E785 Hyperlipidemia, unspecified: Secondary | ICD-10-CM

## 2016-03-27 DIAGNOSIS — I5021 Acute systolic (congestive) heart failure: Secondary | ICD-10-CM

## 2016-03-28 ENCOUNTER — Ambulatory Visit (INDEPENDENT_AMBULATORY_CARE_PROVIDER_SITE_OTHER): Payer: Commercial Managed Care - HMO | Admitting: Cardiology

## 2016-03-28 VITALS — BP 124/80 | HR 59 | Ht 65.0 in | Wt 212.0 lb

## 2016-03-28 DIAGNOSIS — E782 Mixed hyperlipidemia: Secondary | ICD-10-CM

## 2016-03-28 DIAGNOSIS — I428 Other cardiomyopathies: Secondary | ICD-10-CM

## 2016-03-28 DIAGNOSIS — R0609 Other forms of dyspnea: Secondary | ICD-10-CM

## 2016-03-28 DIAGNOSIS — R0600 Dyspnea, unspecified: Secondary | ICD-10-CM

## 2016-03-28 DIAGNOSIS — I110 Hypertensive heart disease with heart failure: Secondary | ICD-10-CM | POA: Diagnosis not present

## 2016-03-28 DIAGNOSIS — R9430 Abnormal result of cardiovascular function study, unspecified: Secondary | ICD-10-CM | POA: Diagnosis not present

## 2016-03-28 NOTE — Patient Instructions (Signed)
**Note De-Identified  Obfuscation** Medication Instructions:  Same-no changes  Labwork: Nine  Testing/Procedures: Your physician has requested that you have an echocardiogram. Echocardiography is a painless test that uses sound waves to create images of your heart. It provides your doctor with information about the size and shape of your heart and how well your heart's chambers and valves are working. This procedure takes approximately one hour. There are no restrictions for this procedure.   Follow-Up: Your physician wants you to follow-up in: 6 months. You will receive a reminder letter in the mail two months in advance. If you don't receive a letter, please call our office to schedule the follow-up appointment.     If you need a refill on your cardiac medications before your next appointment, please call your pharmacy.

## 2016-03-28 NOTE — Progress Notes (Signed)
Cardiology Office Note    Date:  03/28/2016   ID:  Emily Wheeler, DOB 27-Feb-1956, MRN [id number]  PCP:  Sanda Linger, MD  Cardiologist:   Tobias Alexander, MD   No chief complaint on file.   History of Present Illness:  Emily Wheeler is a 60 y.o. female who originally presented for DOE. On disability for arthritis, doesn't walk because of that. Able to do ADL with limitations. Able to go buy groceries. She denies orthopnea, SOB at rest, PND, LE edema. No claudications, she quit smoking when she was 38. Denies chest pain.  Mother had MI, CHF in 42', father died of MI at age 91.  She underwent cardiac cath for an abnormal stress test  In June 2016, cath showed mild CAD, a diagnosis of NICMP was made.    03/28/2016 -  The patient is coming after one year , she has been doing well , she has stable dyspnea on exertion , but overall improved from when she first presented. She denies any chest pain , she states that she gets occasional few second lasting palpitations with uneasy feeling but no dizziness or syncope. She has chronic stable puffiness in her ankles but no edema, no orthopnea or paroxymal nocturnal dyspnea. She ha been compliant with he medicines.  Past Medical History:  Diagnosis Date  . Anemia    mild  . Anxiety   . Arthritis   . Asthma   . Cardiomyopathy (HCC)   . CHF (congestive heart failure) (HCC)   . Diabetes mellitus without complication (HCC)   . Frequency of urination    at night  . GERD (gastroesophageal reflux disease)    potassium worsens reflux  . Hemorrhoids, external   . Hyperlipidemia   . Hypertension   . Neuromuscular disorder (HCC)    neuropathy  . Numbness and tingling of both legs   . Rheumatoid arthritis (HCC)   . Vertigo    hx of    Past Surgical History:  Procedure Laterality Date  . ABDOMINAL HYSTERECTOMY    . CARDIAC CATHETERIZATION N/A 10/28/2014   Procedure: Left Heart Cath and Coronary Angiography;  Surgeon: Corky Crafts,  MD;  Location: [facility] INVASIVE CV LAB;  Service: Cardiovascular;  Laterality: N/A;  . CARPAL TUNNEL RELEASE Left   . COLONOSCOPY     pt states 11 yr ago in [place] had a colon with 2 polyps- one cecal polyp per pt. one ? location  . COLONOSCOPY W/ POLYPECTOMY    . CYST REMOVAL NECK    . HEMORROIDECTOMY    . MAXIMUM ACCESS (MAS)POSTERIOR LUMBAR INTERBODY FUSION (PLIF) 3 LEVEL N/A 10/08/2013   Procedure: FOR MAXIMUM ACCESS (MAS) POSTERIOR LUMBAR INTERBODY FUSION (PLIF) 3 LEVEL;  Surgeon: Maeola Harman, MD;  Location: [facility] NEURO ORS;  Service: Neurosurgery;  Laterality: N/A;  L3-4 L4-5 L5-S1 maximum access posterior lumbar interbody fusion with decompression  . TUBAL LIGATION      Current Medications: Outpatient Medications Prior to Visit  Medication Sig Dispense Refill  . albuterol (PROVENTIL HFA;VENTOLIN HFA) 108 (90 Base) MCG/ACT inhaler Inhale 1-2 puffs into the lungs every 6 (six) hours as needed for wheezing or shortness of breath. 18 g 11  . aspirin EC 81 MG tablet Take 1 tablet (81 mg total) by mouth daily. 90 tablet 3  . atorvastatin (LIPITOR) 40 MG tablet Take 1 tablet (40 mg total) by mouth daily. 90 tablet 3  . Calcium Carb-Cholecalciferol (CALCIUM 600 + D PO) Take 1 tablet by  mouth daily.    . canagliflozin (INVOKANA) 100 MG TABS tablet Take 1 tablet (100 mg total) by mouth daily. Yearly physical w/labs are due must see Md for refills 30 tablet 0  . carvedilol (COREG) 6.25 MG tablet TAKE ONE TABLET BY MOUTH TWICE DAILY WITH A  MEAL 180 tablet 1  . Chromium-Cinnamon [phone] MCG-MG CAPS Take 1 tablet by mouth daily.     . citalopram (CELEXA) 20 MG tablet Take 1 tablet (20 mg total) by mouth daily. 90 tablet 1  . gabapentin (NEURONTIN) 600 MG tablet Take 1 tablet (600 mg total) by mouth 3 (three) times daily. 270 tablet 3  . Glycerin-Hypromellose-PEG 400 (VISINE TEARS OP) Place 1 drop into both eyes daily as needed (for dry eyes).    . hydrochlorothiazide (HYDRODIURIL) 25 MG tablet TAKE ONE  TABLET BY MOUTH ONCE DAILY 90 tablet 1  . leflunomide (ARAVA) 20 MG tablet Take 1 tablet (20 mg total) by mouth daily. 90 tablet 1  . losartan (COZAAR) 100 MG tablet TAKE ONE TABLET BY MOUTH ONCE DAILY. NEED OFFICE VISIT 30 tablet 0  . metFORMIN (GLUCOPHAGE) 500 MG tablet TAKE ONE TABLET BY MOUTH TWICE DAILY A MEAL 180 tablet 1  . Omega-3 Fatty Acids (FISH OIL) 1200 MG CAPS Take 1,200 mg by mouth daily.     . potassium chloride SA (K-DUR,KLOR-CON) 20 MEQ tablet Take 1 tablet (20 mEq total) by mouth 2 (two) times daily. 60 tablet 5  . vitamin C (ASCORBIC ACID) 500 MG tablet Take 500 mg by mouth daily as needed (for cold symptoms).    . citalopram (CELEXA) 20 MG tablet TAKE ONE TABLET BY MOUTH ONCE DAILY 90 tablet 1  . metFORMIN (GLUCOPHAGE) 500 MG tablet Take 1 tablet (500 mg total) by mouth 2 (two) times daily with a meal. 180 tablet 0   No facility-administered medications prior to visit.      Allergies:   Lisinopril   Social History   Social History  . Marital status: Single    Spouse name: N/A  . Number of children: N/A  . Years of education: N/A   Social History Main Topics  . Smoking status: Former Games developer  . Smokeless tobacco: Never Used  . Alcohol use Yes     Comment: 3 x a year -rare   . Drug use: No  . Sexual activity: Not Currently   Other Topics Concern  . Not on file   Social History Narrative  . No narrative on file     Family History:  The patient's family history includes Colon cancer in her maternal grandmother; Colon polyps in her mother; Diabetes in her brother; Early death in her father; Hashimoto's thyroiditis in her sister; Heart disease in her father; Hypertension in her brother and sister; Non-Hodgkin's lymphoma in her sister; Stomach cancer in her maternal aunt.   ROS:   Please see the history of present illness.    ROS All other systems reviewed and are negative.   PHYSICAL EXAM:   VS:  BP 124/80   Pulse (!) 59   Ht 5\' 5"  (1.651 m)   Wt 212 lb  (96.2 kg)   LMP 05/29/1991   BMI 35.28 kg/m    GEN: Well nourished, well developed, in no acute distress  HEENT: normal  Neck: no JVD, carotid bruits, or masses Cardiac: RRR; no murmurs, rubs, or gallops,no edema  Respiratory:  clear to auscultation bilaterally, normal work of breathing GI: soft, nontender, nondistended, + BS MS: no  deformity or atrophy  Skin: warm and dry, no rash Neuro:  Alert and Oriented x 3, Strength and sensation are intact Psych: euthymic mood, full affect  Wt Readings from Last 3 Encounters:  03/28/16 212 lb (96.2 kg)  10/10/15 218 lb (98.9 kg)  09/26/15 218 lb (98.9 kg)      Studies/Labs Reviewed:   EKG:  EKG is ordered today.  The ekg ordered today demonstrates  Sinus rhythm, new  ST depression and negative T waves in inferior and anteroateral leads.  Recent Labs: 08/08/2015: BUN 15; Creatinine, Ser 0.87; Hemoglobin 12.3; Magnesium 1.8; Platelets 267.0; Potassium 3.7; Sodium 141   Lipid Panel    Component Value Date/Time   CHOL 167 02/17/2015 1103   TRIG 140.0 02/17/2015 1103   HDL 61.80 02/17/2015 1103   CHOLHDL 3 02/17/2015 1103   VLDL 28.0 02/17/2015 1103   LDLCALC 77 02/17/2015 1103   LDLDIRECT 162.5 01/30/2013 1602    Additional studies/ records that were reviewed today include:  TTE: 08/30/14 Left ventricle: The cavity size was moderately dilated. Systolic function was moderately reduced. The estimated ejection fraction was in the range of 35% to 40%. Diffuse hypokinesis. There was an increased relative contribution of atrial contraction to ventricular filling. Doppler parameters are consistent with abnormal left ventricular relaxation (grade 1 diastolic dysfunction). - Mitral valve: There was trivial regurgitation. - Left atrium: The atrium was mildly dilated. - Tricuspid valve: There was trivial regurgitation. - Pulmonic valve: There was trivial regurgitation.  TTE 02/10/2015 - Left ventricle: The cavity size was  normal. Wall thickness was normal. Systolic function was mildly to moderately reduced. The estimated ejection fraction was in the range of 40% to 45%. Diffuse hypokinesis. Doppler parameters are consistent with abnormal left ventricular relaxation (grade 1 diastolic dysfunction). Impressions: - When compared to prior, EF is mildly improved.    ASSESSMENT:    1. Other cardiomyopathy (HCC)   2. Non-ischemic cardiomyopathy (HCC)   3. Cardiac LV ejection fraction 30-35%   4. Hypertensive heart disease with CHF (HCC)   5. Mixed hyperlipidemia   6. DOE (dyspnea on exertion)      PLAN:  In order of problems listed above:  1.  Nonischemic cardiomyopathy, moderately dilated LV with LVEF 35-40% in June 2016, improved to 40-45% in September 2016, diffuse hypokinesis, cath showed mild to moderate CAD, continue carvedilol, losartan, crestor, ASA. Lisinopril was discontinued because of cough. Her blood pressure is well controlled we will repeat an echocardiogram to evalute her LVEF.  2. CAD -  Today's EKG shows new ST depression negative T wave in inferio and anterolateral leads these are new compared to the old  EKG , however she is asymptomatic , we will order an echocardiogram and follow. 2. HTN - uncontrolled, increase losartan to 100 mg po daily. Continue aggressive medical management for moderate CAD.  3. HLP -  On atorvastatin 40 mg daily , all lipids at goal in October 2016 she'll have repeat labs tomorrow.   Follow up in 6 months.   Medication Adjustments/Labs and Tests Ordered: Current medicines are reviewed at length with the patient today.  Concerns regarding medicines are outlined above.  Medication changes, Labs and Tests ordered today are listed in the Patient Instructions below. Patient Instructions  Medication Instructions:  Same-no changes  Labwork: Nine  Testing/Procedures: Your physician has requested that you have an echocardiogram. Echocardiography is a  painless test that uses sound waves to create images of your heart. It provides your doctor with information  about the size and shape of your heart and how well your heart's chambers and valves are working. This procedure takes approximately one hour. There are no restrictions for this procedure.   Follow-Up: Your physician wants you to follow-up in: 6 months. You will receive a reminder letter in the mail two months in advance. If you don't receive a letter, please call our office to schedule the follow-up appointment.     If you need a refill on your cardiac medications before your next appointment, please call your pharmacy.      Signed, Tobias Alexander, MD  03/28/2016 8:33 AM    [facility] [address] [place], [place], [place]  [address] Phone: [phone]; Fax: [phone]

## 2016-03-29 ENCOUNTER — Encounter: Payer: Self-pay | Admitting: Internal Medicine

## 2016-03-29 ENCOUNTER — Other Ambulatory Visit (INDEPENDENT_AMBULATORY_CARE_PROVIDER_SITE_OTHER): Payer: Commercial Managed Care - HMO

## 2016-03-29 ENCOUNTER — Ambulatory Visit (INDEPENDENT_AMBULATORY_CARE_PROVIDER_SITE_OTHER): Payer: Commercial Managed Care - HMO | Admitting: Internal Medicine

## 2016-03-29 VITALS — BP 150/78 | HR 55 | Temp 98.5°F | Ht 65.0 in | Wt 210.2 lb

## 2016-03-29 DIAGNOSIS — E118 Type 2 diabetes mellitus with unspecified complications: Secondary | ICD-10-CM

## 2016-03-29 DIAGNOSIS — I10 Essential (primary) hypertension: Secondary | ICD-10-CM

## 2016-03-29 DIAGNOSIS — M069 Rheumatoid arthritis, unspecified: Secondary | ICD-10-CM

## 2016-03-29 DIAGNOSIS — E876 Hypokalemia: Secondary | ICD-10-CM

## 2016-03-29 DIAGNOSIS — E785 Hyperlipidemia, unspecified: Secondary | ICD-10-CM

## 2016-03-29 DIAGNOSIS — Z23 Encounter for immunization: Secondary | ICD-10-CM | POA: Diagnosis not present

## 2016-03-29 DIAGNOSIS — I5021 Acute systolic (congestive) heart failure: Secondary | ICD-10-CM

## 2016-03-29 DIAGNOSIS — Z0000 Encounter for general adult medical examination without abnormal findings: Secondary | ICD-10-CM | POA: Diagnosis not present

## 2016-03-29 LAB — URINALYSIS, ROUTINE W REFLEX MICROSCOPIC
Bilirubin Urine: NEGATIVE
Hgb urine dipstick: NEGATIVE
KETONES UR: NEGATIVE
Nitrite: NEGATIVE
PH: 6 (ref 5.0–8.0)
SPECIFIC GRAVITY, URINE: 1.01 (ref 1.000–1.030)
Total Protein, Urine: NEGATIVE
UROBILINOGEN UA: 0.2 (ref 0.0–1.0)

## 2016-03-29 LAB — COMPREHENSIVE METABOLIC PANEL
ALBUMIN: 4.2 g/dL (ref 3.5–5.2)
ALK PHOS: 82 U/L (ref 39–117)
ALT: 12 U/L (ref 0–35)
AST: 13 U/L (ref 0–37)
BILIRUBIN TOTAL: 0.5 mg/dL (ref 0.2–1.2)
BUN: 14 mg/dL (ref 6–23)
CALCIUM: 9.5 mg/dL (ref 8.4–10.5)
CHLORIDE: 105 meq/L (ref 96–112)
CO2: 30 mEq/L (ref 19–32)
CREATININE: 0.95 mg/dL (ref 0.40–1.20)
GFR: 76.64 mL/min (ref >60.00)
Glucose, Bld: 103 mg/dL — ABNORMAL HIGH (ref 70–99)
Potassium: 4.2 mEq/L (ref 3.5–5.1)
Sodium: 141 mEq/L (ref 135–145)
TOTAL PROTEIN: 6.8 g/dL (ref 6.0–8.3)

## 2016-03-29 LAB — CBC WITH DIFFERENTIAL/PLATELET
BASOS ABS: 0 10*3/uL (ref 0.0–0.1)
BASOS PCT: 0.5 % (ref 0.0–3.0)
EOS ABS: 0.3 10*3/uL (ref 0.0–0.7)
Eosinophils Relative: 4.8 % (ref 0.0–5.0)
HEMATOCRIT: 36 % (ref 36.0–46.0)
HEMOGLOBIN: 11.8 g/dL — AB (ref 12.0–15.0)
LYMPHS PCT: 41.9 % (ref 12.0–46.0)
Lymphs Abs: 2.5 10*3/uL (ref 0.7–4.0)
MCHC: 32.9 g/dL (ref 30.0–36.0)
MCV: 93 fl (ref 78.0–100.0)
Monocytes Absolute: 0.8 10*3/uL (ref 0.1–1.0)
Monocytes Relative: 13.1 % — ABNORMAL HIGH (ref 3.0–12.0)
Neutro Abs: 2.4 10*3/uL (ref 1.4–7.7)
Neutrophils Relative %: 39.7 % — ABNORMAL LOW (ref 43.0–77.0)
Platelets: 235 10*3/uL (ref 150.0–400.0)
RBC: 3.87 Mil/uL (ref 3.87–5.11)
RDW: 15 % (ref 11.5–15.5)
WBC: 6 10*3/uL (ref 4.0–10.5)

## 2016-03-29 LAB — MICROALBUMIN / CREATININE URINE RATIO
CREATININE, U: 84.2 mg/dL
Microalb Creat Ratio: 1.2 mg/g (ref 0.0–30.0)
Microalb, Ur: 1 mg/dL (ref 0.0–1.9)

## 2016-03-29 LAB — LIPID PANEL
CHOLESTEROL: 149 mg/dL (ref 0–200)
HDL: 46.4 mg/dL (ref >39.00)
LDL CALC: 76 mg/dL (ref 0–99)
NonHDL: 102.94
TRIGLYCERIDES: 136 mg/dL (ref 0.0–149.0)
Total CHOL/HDL Ratio: 3
VLDL: 27.2 mg/dL (ref 0.0–40.0)

## 2016-03-29 LAB — HEMOGLOBIN A1C: HEMOGLOBIN A1C: 6.3 % (ref 4.6–6.5)

## 2016-03-29 LAB — TSH: TSH: 1.19 u[IU]/mL (ref 0.35–4.50)

## 2016-03-29 LAB — MAGNESIUM: MAGNESIUM: 1.8 mg/dL (ref 1.5–2.5)

## 2016-03-29 MED ORDER — LOSARTAN POTASSIUM 100 MG PO TABS: 100.0000 mg | ORAL_TABLET | Freq: Every day | ORAL | 3 refills | Status: DC

## 2016-03-29 NOTE — Patient Instructions (Signed)
Preventive Care for Adults, Female A healthy lifestyle and preventive care can promote health and wellness. Preventive health guidelines for women include the following key practices.  A routine yearly physical is a good way to check with your health care provider about your health and preventive screening. It is a chance to share any concerns and updates on your health and to receive a thorough exam.  Visit your dentist for a routine exam and preventive care every 6 months. Brush your teeth twice a day and floss once a day. Good oral hygiene prevents tooth decay and gum disease.  The frequency of eye exams is based on your age, health, family medical history, use of contact lenses, and other factors. Follow your health care provider's recommendations for frequency of eye exams.  Eat a healthy diet. Foods like vegetables, fruits, whole grains, low-fat dairy products, and lean protein foods contain the nutrients you need without too many calories. Decrease your intake of foods high in solid fats, added sugars, and salt. Eat the right amount of calories for you.Get information about a proper diet from your health care provider, if necessary.  Regular physical exercise is one of the most important things you can do for your health. Most adults should get at least 150 minutes of moderate-intensity exercise (any activity that increases your heart rate and causes you to sweat) each week. In addition, most adults need muscle-strengthening exercises on 2 or more days a week.  Maintain a healthy weight. The body mass index (BMI) is a screening tool to identify possible weight problems. It provides an estimate of body fat based on height and weight. Your health care provider can find your BMI and can help you achieve or maintain a healthy weight.For adults 20 years and older:  A BMI below 18.5 is considered underweight.  A BMI of 18.5 to 24.9 is normal.  A BMI of 25 to 29.9 is considered overweight.  A  BMI of 30 and above is considered obese.  Maintain normal blood lipids and cholesterol levels by exercising and minimizing your intake of saturated fat. Eat a balanced diet with plenty of fruit and vegetables. Blood tests for lipids and cholesterol should begin at age 45 and be repeated every 5 years. If your lipid or cholesterol levels are high, you are over 50, or you are at high risk for heart disease, you may need your cholesterol levels checked more frequently.Ongoing high lipid and cholesterol levels should be treated with medicines if diet and exercise are not working.  If you smoke, find out from your health care provider how to quit. If you do not use tobacco, do not start.  Lung cancer screening is recommended for adults aged 45-80 years who are at high risk for developing lung cancer because of a history of smoking. A yearly low-dose CT scan of the lungs is recommended for people who have at least a 30-pack-year history of smoking and are a current smoker or have quit within the past 15 years. A pack year of smoking is smoking an average of 1 pack of cigarettes a day for 1 year (for example: 1 pack a day for 30 years or 2 packs a day for 15 years). Yearly screening should continue until the smoker has stopped smoking for at least 15 years. Yearly screening should be stopped for people who develop a health problem that would prevent them from having lung cancer treatment.  If you are pregnant, do not drink alcohol. If you are  breastfeeding, be very cautious about drinking alcohol. If you are not pregnant and choose to drink alcohol, do not have more than 1 drink per day. One drink is considered to be 12 ounces (355 mL) of beer, 5 ounces (148 mL) of wine, or 1.5 ounces (44 mL) of liquor.  Avoid use of street drugs. Do not share needles with anyone. Ask for help if you need support or instructions about stopping the use of drugs.  High blood pressure causes heart disease and increases the risk  of stroke. Your blood pressure should be checked at least every 1 to 2 years. Ongoing high blood pressure should be treated with medicines if weight loss and exercise do not work.  If you are 55-79 years old, ask your health care provider if you should take aspirin to prevent strokes.  Diabetes screening is done by taking a blood sample to check your blood glucose level after you have not eaten for a certain period of time (fasting). If you are not overweight and you do not have risk factors for diabetes, you should be screened once every 3 years starting at age 45. If you are overweight or obese and you are 40-70 years of age, you should be screened for diabetes every year as part of your cardiovascular risk assessment.  Breast cancer screening is essential preventive care for women. You should practice "breast self-awareness." This means understanding the normal appearance and feel of your breasts and may include breast self-examination. Any changes detected, no matter how small, should be reported to a health care provider. Women in their 20s and 30s should have a clinical breast exam (CBE) by a health care provider as part of a regular health exam every 1 to 3 years. After age 40, women should have a CBE every year. Starting at age 40, women should consider having a mammogram (breast X-ray test) every year. Women who have a family history of breast cancer should talk to their health care provider about genetic screening. Women at a high risk of breast cancer should talk to their health care providers about having an MRI and a mammogram every year.  Breast cancer gene (BRCA)-related cancer risk assessment is recommended for women who have family members with BRCA-related cancers. BRCA-related cancers include breast, ovarian, tubal, and peritoneal cancers. Having family members with these cancers may be associated with an increased risk for harmful changes (mutations) in the breast cancer genes BRCA1 and  BRCA2. Results of the assessment will determine the need for genetic counseling and BRCA1 and BRCA2 testing.  Your health care provider may recommend that you be screened regularly for cancer of the pelvic organs (ovaries, uterus, and vagina). This screening involves a pelvic examination, including checking for microscopic changes to the surface of your cervix (Pap test). You may be encouraged to have this screening done every 3 years, beginning at age 21.  For women ages 30-65, health care providers may recommend pelvic exams and Pap testing every 3 years, or they may recommend the Pap and pelvic exam, combined with testing for human papilloma virus (HPV), every 5 years. Some types of HPV increase your risk of cervical cancer. Testing for HPV may also be done on women of any age with unclear Pap test results.  Other health care providers may not recommend any screening for nonpregnant women who are considered low risk for pelvic cancer and who do not have symptoms. Ask your health care provider if a screening pelvic exam is right for   you.  If you have had past treatment for cervical cancer or a condition that could lead to cancer, you need Pap tests and screening for cancer for at least 20 years after your treatment. If Pap tests have been discontinued, your risk factors (such as having a new sexual partner) need to be reassessed to determine if screening should resume. Some women have medical problems that increase the chance of getting cervical cancer. In these cases, your health care provider may recommend more frequent screening and Pap tests.  Colorectal cancer can be detected and often prevented. Most routine colorectal cancer screening begins at the age of 50 years and continues through age 75 years. However, your health care provider may recommend screening at an earlier age if you have risk factors for colon cancer. On a yearly basis, your health care provider may provide home test kits to check  for hidden blood in the stool. Use of a small camera at the end of a tube, to directly examine the colon (sigmoidoscopy or colonoscopy), can detect the earliest forms of colorectal cancer. Talk to your health care provider about this at age 50, when routine screening begins. Direct exam of the colon should be repeated every 5-10 years through age 75 years, unless early forms of precancerous polyps or small growths are found.  People who are at an increased risk for hepatitis B should be screened for this virus. You are considered at high risk for hepatitis B if:  You were born in a country where hepatitis B occurs often. Talk with your health care provider about which countries are considered high risk.  Your parents were born in a high-risk country and you have not received a shot to protect against hepatitis B (hepatitis B vaccine).  You have HIV or AIDS.  You use needles to inject street drugs.  You live with, or have sex with, someone who has hepatitis B.  You get hemodialysis treatment.  You take certain medicines for conditions like cancer, organ transplantation, and autoimmune conditions.  Hepatitis C blood testing is recommended for all people born from 1945 through 1965 and any individual with known risks for hepatitis C.  Practice safe sex. Use condoms and avoid high-risk sexual practices to reduce the spread of sexually transmitted infections (STIs). STIs include gonorrhea, chlamydia, syphilis, trichomonas, herpes, HPV, and human immunodeficiency virus (HIV). Herpes, HIV, and HPV are viral illnesses that have no cure. They can result in disability, cancer, and death.  You should be screened for sexually transmitted illnesses (STIs) including gonorrhea and chlamydia if:  You are sexually active and are younger than 24 years.  You are older than 24 years and your health care provider tells you that you are at risk for this type of infection.  Your sexual activity has changed  since you were last screened and you are at an increased risk for chlamydia or gonorrhea. Ask your health care provider if you are at risk.  If you are at risk of being infected with HIV, it is recommended that you take a prescription medicine daily to prevent HIV infection. This is called preexposure prophylaxis (PrEP). You are considered at risk if:  You are sexually active and do not regularly use condoms or know the HIV status of your partner(s).  You take drugs by injection.  You are sexually active with a partner who has HIV.  Talk with your health care provider about whether you are at high risk of being infected with HIV. If   you choose to begin PrEP, you should first be tested for HIV. You should then be tested every 3 months for as long as you are taking PrEP.  Osteoporosis is a disease in which the bones lose minerals and strength with aging. This can result in serious bone fractures or breaks. The risk of osteoporosis can be identified using a bone density scan. Women ages 67 years and over and women at risk for fractures or osteoporosis should discuss screening with their health care providers. Ask your health care provider whether you should take a calcium supplement or vitamin D to reduce the rate of osteoporosis.  Menopause can be associated with physical symptoms and risks. Hormone replacement therapy is available to decrease symptoms and risks. You should talk to your health care provider about whether hormone replacement therapy is right for you.  Use sunscreen. Apply sunscreen liberally and repeatedly throughout the day. You should seek shade when your shadow is shorter than you. Protect yourself by wearing long sleeves, pants, a wide-brimmed hat, and sunglasses year round, whenever you are outdoors.  Once a month, do a whole body skin exam, using a mirror to look at the skin on your back. Tell your health care provider of new moles, moles that have irregular borders, moles that  are larger than a pencil eraser, or moles that have changed in shape or color.  Stay current with required vaccines (immunizations).  Influenza vaccine. All adults should be immunized every year.  Tetanus, diphtheria, and acellular pertussis (Td, Tdap) vaccine. Pregnant women should receive 1 dose of Tdap vaccine during each pregnancy. The dose should be obtained regardless of the length of time since the last dose. Immunization is preferred during the 27th-36th week of gestation. An adult who has not previously received Tdap or who does not know her vaccine status should receive 1 dose of Tdap. This initial dose should be followed by tetanus and diphtheria toxoids (Td) booster doses every 10 years. Adults with an unknown or incomplete history of completing a 3-dose immunization series with Td-containing vaccines should begin or complete a primary immunization series including a Tdap dose. Adults should receive a Td booster every 10 years.  Varicella vaccine. An adult without evidence of immunity to varicella should receive 2 doses or a second dose if she has previously received 1 dose. Pregnant females who do not have evidence of immunity should receive the first dose after pregnancy. This first dose should be obtained before leaving the health care facility. The second dose should be obtained 4-8 weeks after the first dose.  Human papillomavirus (HPV) vaccine. Females aged 13-26 years who have not received the vaccine previously should obtain the 3-dose series. The vaccine is not recommended for use in pregnant females. However, pregnancy testing is not needed before receiving a dose. If a female is found to be pregnant after receiving a dose, no treatment is needed. In that case, the remaining doses should be delayed until after the pregnancy. Immunization is recommended for any person with an immunocompromised condition through the age of 61 years if she did not get any or all doses earlier. During the  3-dose series, the second dose should be obtained 4-8 weeks after the first dose. The third dose should be obtained 24 weeks after the first dose and 16 weeks after the second dose.  Zoster vaccine. One dose is recommended for adults aged 30 years or older unless certain conditions are present.  Measles, mumps, and rubella (MMR) vaccine. Adults born  before 1957 generally are considered immune to measles and mumps. Adults born in 1957 or later should have 1 or more doses of MMR vaccine unless there is a contraindication to the vaccine or there is laboratory evidence of immunity to each of the three diseases. A routine second dose of MMR vaccine should be obtained at least 28 days after the first dose for students attending postsecondary schools, health care workers, or international travelers. People who received inactivated measles vaccine or an unknown type of measles vaccine during 1963-1967 should receive 2 doses of MMR vaccine. People who received inactivated mumps vaccine or an unknown type of mumps vaccine before 1979 and are at high risk for mumps infection should consider immunization with 2 doses of MMR vaccine. For females of childbearing age, rubella immunity should be determined. If there is no evidence of immunity, females who are not pregnant should be vaccinated. If there is no evidence of immunity, females who are pregnant should delay immunization until after pregnancy. Unvaccinated health care workers born before 1957 who lack laboratory evidence of measles, mumps, or rubella immunity or laboratory confirmation of disease should consider measles and mumps immunization with 2 doses of MMR vaccine or rubella immunization with 1 dose of MMR vaccine.  Pneumococcal 13-valent conjugate (PCV13) vaccine. When indicated, a person who is uncertain of his immunization history and has no record of immunization should receive the PCV13 vaccine. All adults 65 years of age and older should receive this  vaccine. An adult aged 19 years or older who has certain medical conditions and has not been previously immunized should receive 1 dose of PCV13 vaccine. This PCV13 should be followed with a dose of pneumococcal polysaccharide (PPSV23) vaccine. Adults who are at high risk for pneumococcal disease should obtain the PPSV23 vaccine at least 8 weeks after the dose of PCV13 vaccine. Adults older than 62 years of age who have normal immune system function should obtain the PPSV23 vaccine dose at least 1 year after the dose of PCV13 vaccine.  Pneumococcal polysaccharide (PPSV23) vaccine. When PCV13 is also indicated, PCV13 should be obtained first. All adults aged 65 years and older should be immunized. An adult younger than age 65 years who has certain medical conditions should be immunized. Any person who resides in a nursing home or long-term care facility should be immunized. An adult smoker should be immunized. People with an immunocompromised condition and certain other conditions should receive both PCV13 and PPSV23 vaccines. People with human immunodeficiency virus (HIV) infection should be immunized as soon as possible after diagnosis. Immunization during chemotherapy or radiation therapy should be avoided. Routine use of PPSV23 vaccine is not recommended for American Indians, [place] Natives, or people younger than 65 years unless there are medical conditions that require PPSV23 vaccine. When indicated, people who have unknown immunization and have no record of immunization should receive PPSV23 vaccine. One-time revaccination 5 years after the first dose of PPSV23 is recommended for people aged 19-64 years who have chronic kidney failure, nephrotic syndrome, asplenia, or immunocompromised conditions. People who received 1-2 doses of PPSV23 before age 65 years should receive another dose of PPSV23 vaccine at age 65 years or later if at least 5 years have passed since the previous dose. Doses of PPSV23 are not  needed for people immunized with PPSV23 at or after age 65 years.  Meningococcal vaccine. Adults with asplenia or persistent complement component deficiencies should receive 2 doses of quadrivalent meningococcal conjugate (MenACWY-D) vaccine. The doses should be obtained   at least 2 months apart. Microbiologists working with certain meningococcal bacteria, [place] recruits, people at risk during an outbreak, and people who travel to or live in countries with a high rate of meningitis should be immunized. A first-year college student up through age 34 years who is living in a residence hall should receive a dose if she did not receive a dose on or after her 16th birthday. Adults who have certain high-risk conditions should receive one or more doses of vaccine.  Hepatitis A vaccine. Adults who wish to be protected from this disease, have certain high-risk conditions, work with hepatitis A-infected animals, work in hepatitis A research labs, or travel to or work in countries with a high rate of hepatitis A should be immunized. Adults who were previously unvaccinated and who anticipate close contact with an international adoptee during the first 60 days after arrival in the [place] from a country with a high rate of hepatitis A should be immunized.  Hepatitis B vaccine. Adults who wish to be protected from this disease, have certain high-risk conditions, may be exposed to blood or other infectious body fluids, are household contacts or sex partners of hepatitis B positive people, are clients or workers in certain care facilities, or travel to or work in countries with a high rate of hepatitis B should be immunized.  Haemophilus influenzae type b (Hib) vaccine. A previously unvaccinated person with asplenia or sickle cell disease or having a scheduled splenectomy should receive 1 dose of Hib vaccine. Regardless of previous immunization, a recipient of a hematopoietic stem cell transplant should receive a  3-dose series 6-12 months after her successful transplant. Hib vaccine is not recommended for adults with HIV infection. Preventive Services / Frequency Ages 35 to 4 years  Blood pressure check.** / Every 3-5 years.  Lipid and cholesterol check.** / Every 5 years beginning at age 60.  Clinical breast exam.** / Every 3 years for women in their 71s and 10s.  BRCA-related cancer risk assessment.** / For women who have family members with a BRCA-related cancer (breast, ovarian, tubal, or peritoneal cancers).  Pap test.** / Every 2 years from ages 76 through 26. Every 3 years starting at age 61 through age 76 or 93 with a history of 3 consecutive normal Pap tests.  HPV screening.** / Every 3 years from ages 37 through ages 60 to 51 with a history of 3 consecutive normal Pap tests.  Hepatitis C blood test.** / For any individual with known risks for hepatitis C.  Skin self-exam. / Monthly.  Influenza vaccine. / Every year.  Tetanus, diphtheria, and acellular pertussis (Tdap, Td) vaccine.** / Consult your health care provider. Pregnant women should receive 1 dose of Tdap vaccine during each pregnancy. 1 dose of Td every 10 years.  Varicella vaccine.** / Consult your health care provider. Pregnant females who do not have evidence of immunity should receive the first dose after pregnancy.  HPV vaccine. / 3 doses over 6 months, if 93 and younger. The vaccine is not recommended for use in pregnant females. However, pregnancy testing is not needed before receiving a dose.  Measles, mumps, rubella (MMR) vaccine.** / You need at least 1 dose of MMR if you were born in 1957 or later. You may also need a 2nd dose. For females of childbearing age, rubella immunity should be determined. If there is no evidence of immunity, females who are not pregnant should be vaccinated. If there is no evidence of immunity, females who are  pregnant should delay immunization until after pregnancy.  Pneumococcal  13-valent conjugate (PCV13) vaccine.** / Consult your health care provider.  Pneumococcal polysaccharide (PPSV23) vaccine.** / 1 to 2 doses if you smoke cigarettes or if you have certain conditions.  Meningococcal vaccine.** / 1 dose if you are age 68 to 8 years and a Market researcher living in a residence hall, or have one of several medical conditions, you need to get vaccinated against meningococcal disease. You may also need additional booster doses.  Hepatitis A vaccine.** / Consult your health care provider.  Hepatitis B vaccine.** / Consult your health care provider.  Haemophilus influenzae type b (Hib) vaccine.** / Consult your health care provider. Ages 7 to 53 years  Blood pressure check.** / Every year.  Lipid and cholesterol check.** / Every 5 years beginning at age 25 years.  Lung cancer screening. / Every year if you are aged 11-80 years and have a 30-pack-year history of smoking and currently smoke or have quit within the past 15 years. Yearly screening is stopped once you have quit smoking for at least 15 years or develop a health problem that would prevent you from having lung cancer treatment.  Clinical breast exam.** / Every year after age 48 years.  BRCA-related cancer risk assessment.** / For women who have family members with a BRCA-related cancer (breast, ovarian, tubal, or peritoneal cancers).  Mammogram.** / Every year beginning at age 41 years and continuing for as long as you are in good health. Consult with your health care provider.  Pap test.** / Every 3 years starting at age 65 years through age 37 or 70 years with a history of 3 consecutive normal Pap tests.  HPV screening.** / Every 3 years from ages 72 years through ages 60 to 40 years with a history of 3 consecutive normal Pap tests.  Fecal occult blood test (FOBT) of stool. / Every year beginning at age 21 years and continuing until age 5 years. You may not need to do this test if you get  a colonoscopy every 10 years.  Flexible sigmoidoscopy or colonoscopy.** / Every 5 years for a flexible sigmoidoscopy or every 10 years for a colonoscopy beginning at age 35 years and continuing until age 48 years.  Hepatitis C blood test.** / For all people born from [birth year] through 1965 and any individual with known risks for hepatitis C.  Skin self-exam. / Monthly.  Influenza vaccine. / Every year.  Tetanus, diphtheria, and acellular pertussis (Tdap/Td) vaccine.** / Consult your health care provider. Pregnant women should receive 1 dose of Tdap vaccine during each pregnancy. 1 dose of Td every 10 years.  Varicella vaccine.** / Consult your health care provider. Pregnant females who do not have evidence of immunity should receive the first dose after pregnancy.  Zoster vaccine.** / 1 dose for adults aged 30 years or older.  Measles, mumps, rubella (MMR) vaccine.** / You need at least 1 dose of MMR if you were born in 1957 or later. You may also need a second dose. For females of childbearing age, rubella immunity should be determined. If there is no evidence of immunity, females who are not pregnant should be vaccinated. If there is no evidence of immunity, females who are pregnant should delay immunization until after pregnancy.  Pneumococcal 13-valent conjugate (PCV13) vaccine.** / Consult your health care provider.  Pneumococcal polysaccharide (PPSV23) vaccine.** / 1 to 2 doses if you smoke cigarettes or if you have certain conditions.  Meningococcal vaccine.** /  Consult your health care provider.  Hepatitis A vaccine.** / Consult your health care provider.  Hepatitis B vaccine.** / Consult your health care provider.  Haemophilus influenzae type b (Hib) vaccine.** / Consult your health care provider. Ages 64 years and over  Blood pressure check.** / Every year.  Lipid and cholesterol check.** / Every 5 years beginning at age 23 years.  Lung cancer screening. / Every year if you  are aged 16-80 years and have a 30-pack-year history of smoking and currently smoke or have quit within the past 15 years. Yearly screening is stopped once you have quit smoking for at least 15 years or develop a health problem that would prevent you from having lung cancer treatment.  Clinical breast exam.** / Every year after age 74 years.  BRCA-related cancer risk assessment.** / For women who have family members with a BRCA-related cancer (breast, ovarian, tubal, or peritoneal cancers).  Mammogram.** / Every year beginning at age 44 years and continuing for as long as you are in good health. Consult with your health care provider.  Pap test.** / Every 3 years starting at age 58 years through age 22 or 39 years with 3 consecutive normal Pap tests. Testing can be stopped between 65 and 70 years with 3 consecutive normal Pap tests and no abnormal Pap or HPV tests in the past 10 years.  HPV screening.** / Every 3 years from ages 64 years through ages 70 or 61 years with a history of 3 consecutive normal Pap tests. Testing can be stopped between 65 and 70 years with 3 consecutive normal Pap tests and no abnormal Pap or HPV tests in the past 10 years.  Fecal occult blood test (FOBT) of stool. / Every year beginning at age 40 years and continuing until age 27 years. You may not need to do this test if you get a colonoscopy every 10 years.  Flexible sigmoidoscopy or colonoscopy.** / Every 5 years for a flexible sigmoidoscopy or every 10 years for a colonoscopy beginning at age 7 years and continuing until age 32 years.  Hepatitis C blood test.** / For all people born from [birth year] through 1967 and any individual with known risks for hepatitis C.  Osteoporosis screening.** / A one-time screening for women ages 30 years and over and women at risk for fractures or osteoporosis.  Skin self-exam. / Monthly.  Influenza vaccine. / Every year.  Tetanus, diphtheria, and acellular pertussis (Tdap/Td)  vaccine.** / 1 dose of Td every 10 years.  Varicella vaccine.** / Consult your health care provider.  Zoster vaccine.** / 1 dose for adults aged 35 years or older.  Pneumococcal 13-valent conjugate (PCV13) vaccine.** / Consult your health care provider.  Pneumococcal polysaccharide (PPSV23) vaccine.** / 1 dose for all adults aged 46 years and older.  Meningococcal vaccine.** / Consult your health care provider.  Hepatitis A vaccine.** / Consult your health care provider.  Hepatitis B vaccine.** / Consult your health care provider.  Haemophilus influenzae type b (Hib) vaccine.** / Consult your health care provider. ** Family history and personal history of risk and conditions may change your health care provider's recommendations.   This information is not intended to replace advice given to you by your health care provider. Make sure you discuss any questions you have with your health care provider.   Document Released: 07/10/2001 Document Revised: 06/04/2014 Document Reviewed: 10/09/2010 Elsevier Interactive Patient Education [employer].

## 2016-03-29 NOTE — Progress Notes (Signed)
Pre visit review using our clinic review tool, if applicable. No additional management support is needed unless otherwise documented below in the visit note. 

## 2016-03-29 NOTE — Progress Notes (Signed)
Subjective:  Patient ID: Emily Wheeler, female    DOB: [birth year]  Age: 60 y.o. MRN: [id number]  CC: Annual Exam; Hypertension; Hyperlipidemia; and Diabetes   HPI Emily Wheeler presents for a CPX/AWV.  She complains of joint pain in her knees and hips and wants to know if she can have another steroid injection. She has been treated for rheumatoid arthritis by several rheumatologists. She says her joints do not feel red or swollen. She feels like for the most part she is well maintained on ARAVA.  She tells me her blood pressure has been well controlled on the ARB, thiazide diuretic, and beta blocker. She's had no recent episodes of headache/blurred vision/chest pain/shortness of breath/edema/DOE/or palpitations.  Outpatient Medications Prior to Visit  Medication Sig Dispense Refill  . albuterol (PROVENTIL HFA;VENTOLIN HFA) 108 (90 Base) MCG/ACT inhaler Inhale 1-2 puffs into the lungs every 6 (six) hours as needed for wheezing or shortness of breath. 18 g 11  . aspirin EC 81 MG tablet Take 1 tablet (81 mg total) by mouth daily. 90 tablet 3  . atorvastatin (LIPITOR) 40 MG tablet Take 1 tablet (40 mg total) by mouth daily. 90 tablet 3  . Calcium Carb-Cholecalciferol (CALCIUM 600 + D PO) Take 1 tablet by mouth daily.    . canagliflozin (INVOKANA) 100 MG TABS tablet Take 1 tablet (100 mg total) by mouth daily. Yearly physical w/labs are due must see Md for refills 30 tablet 0  . carvedilol (COREG) 6.25 MG tablet TAKE ONE TABLET BY MOUTH TWICE DAILY WITH A  MEAL 180 tablet 1  . citalopram (CELEXA) 20 MG tablet Take 1 tablet (20 mg total) by mouth daily. 90 tablet 1  . gabapentin (NEURONTIN) 600 MG tablet Take 1 tablet (600 mg total) by mouth 3 (three) times daily. 270 tablet 3  . hydrochlorothiazide (HYDRODIURIL) 25 MG tablet TAKE ONE TABLET BY MOUTH ONCE DAILY 90 tablet 1  . leflunomide (ARAVA) 20 MG tablet Take 1 tablet (20 mg total) by mouth daily. 90 tablet 1  . metFORMIN (GLUCOPHAGE)  500 MG tablet TAKE ONE TABLET BY MOUTH TWICE DAILY A MEAL 180 tablet 1  . Omega-3 Fatty Acids (FISH OIL) 1200 MG CAPS Take 1,200 mg by mouth daily.     . potassium chloride SA (K-DUR,KLOR-CON) 20 MEQ tablet Take 1 tablet (20 mEq total) by mouth 2 (two) times daily. 60 tablet 5  . vitamin C (ASCORBIC ACID) 500 MG tablet Take 500 mg by mouth daily as needed (for cold symptoms).    . Chromium-Cinnamon [phone] MCG-MG CAPS Take 1 tablet by mouth daily.     . Glycerin-Hypromellose-PEG 400 (VISINE TEARS OP) Place 1 drop into both eyes daily as needed (for dry eyes).    Marland Kitchen losartan (COZAAR) 100 MG tablet TAKE ONE TABLET BY MOUTH ONCE DAILY. NEED OFFICE VISIT 30 tablet 0   No facility-administered medications prior to visit.     ROS Review of Systems  Constitutional: Negative.  Negative for activity change, appetite change, chills, diaphoresis, fatigue, fever and unexpected weight change.  HENT: Negative.  Negative for trouble swallowing and voice change.   Eyes: Negative for visual disturbance.  Respiratory: Negative.  Negative for cough, choking, chest tightness, shortness of breath and stridor.   Cardiovascular: Negative.  Negative for chest pain, palpitations and leg swelling.  Gastrointestinal: Negative.  Negative for abdominal pain, constipation, diarrhea, nausea and vomiting.  Endocrine: Negative.  Negative for cold intolerance, heat intolerance, polydipsia, polyphagia and polyuria.  Genitourinary:  Negative.  Negative for difficulty urinating.  Musculoskeletal: Positive for arthralgias. Negative for back pain, gait problem, myalgias and neck pain.  Skin: Negative.  Negative for color change and rash.  Allergic/Immunologic: Negative.   Neurological: Negative.  Negative for dizziness, syncope, weakness and headaches.  Hematological: Negative for adenopathy. Does not bruise/bleed easily.  Psychiatric/Behavioral: Negative.     Objective:  BP (!) 150/78 (BP Location: Left Arm, Patient  Position: Sitting, Cuff Size: Large)   Pulse (!) 55   Temp 98.5 F (36.9 C) (Oral)   Ht 5\' 5"  (1.651 m)   Wt 210 lb 3 oz (95.3 kg)   LMP 05/29/1991   SpO2 96%   BMI 34.98 kg/m   BP Readings from Last 3 Encounters:  03/29/16 (!) 150/78  03/28/16 124/80  10/10/15 (!) 141/88    Wt Readings from Last 3 Encounters:  03/29/16 210 lb 3 oz (95.3 kg)  03/28/16 212 lb (96.2 kg)  10/10/15 218 lb (98.9 kg)    Physical Exam  Constitutional: She is oriented to person, place, and time. She appears well-developed and well-nourished. No distress.  HENT:  Mouth/Throat: Oropharynx is clear and moist. No oropharyngeal exudate.  Eyes: Conjunctivae are normal. Right eye exhibits no discharge. Left eye exhibits no discharge. No scleral icterus.  Neck: Normal range of motion. Neck supple. No JVD present. No tracheal deviation present. No thyromegaly present.  Cardiovascular: Normal rate, regular rhythm, normal heart sounds and intact distal pulses.  Exam reveals no gallop and no friction rub.   No murmur heard. Pulmonary/Chest: Effort normal and breath sounds normal. No stridor. No respiratory distress. She has no wheezes. She has no rales. She exhibits no tenderness.  Abdominal: Soft. Bowel sounds are normal. She exhibits no distension and no mass. There is no tenderness. There is no rebound and no guarding.  Musculoskeletal: Normal range of motion. She exhibits no edema, tenderness or deformity.       Right knee: Normal. She exhibits no swelling, no effusion, no deformity and no erythema. No tenderness found.       Left knee: She exhibits no swelling, no effusion, no deformity and no erythema. No tenderness found.  Lymphadenopathy:    She has no cervical adenopathy.  Neurological: She is oriented to person, place, and time.  Skin: Skin is warm and dry. No rash noted. She is not diaphoretic. No erythema. No pallor.  Vitals reviewed.   Lab Results  Component Value Date   WBC 6.0 03/29/2016    HGB 11.8 (L) 03/29/2016   HCT 36.0 03/29/2016   PLT 235.0 03/29/2016   GLUCOSE 103 (H) 03/29/2016   CHOL 149 03/29/2016   TRIG 136.0 03/29/2016   HDL 46.40 03/29/2016   LDLDIRECT 162.5 01/30/2013   LDLCALC 76 03/29/2016   ALT 12 03/29/2016   AST 13 03/29/2016   NA 141 03/29/2016   K 4.2 03/29/2016   CL 105 03/29/2016   CREATININE 0.95 03/29/2016   BUN 14 03/29/2016   CO2 30 03/29/2016   TSH 1.19 03/29/2016   INR 0.9 10/26/2014   HGBA1C 6.3 03/29/2016   MICROALBUR 1.0 03/29/2016    Mm Screening Breast Tomo Bilateral  Result Date: 09/20/2015 CLINICAL DATA:  Screening. EXAM: 2D DIGITAL SCREENING BILATERAL MAMMOGRAM WITH CAD AND ADJUNCT TOMO COMPARISON:  Previous exam(s). ACR Breast Density Category a: The breast tissue is almost entirely fatty. FINDINGS: There are no findings suspicious for malignancy. Images were processed with CAD. IMPRESSION: No mammographic evidence of malignancy. A result letter of  this screening mammogram will be mailed directly to the patient. RECOMMENDATION: Screening mammogram in one year. (Code:[id number]-B-01Y) BI-RADS CATEGORY  1: Negative. Electronically Signed   By: Amie Portland M.D.   On: 09/20/2015 14:27    Assessment & Plan:   Mirel was seen today for annual exam, hypertension, hyperlipidemia and diabetes.  Diagnoses and all orders for this visit:  Essential hypertension, benign- Her blood pressure is well-controlled, electrolytes and renal function are stable. -     Comprehensive metabolic panel; Future -     CBC with Differential/Platelet; Future -     Urinalysis, Routine w reflex microscopic (not at [facility]); Future -     losartan (COZAAR) 100 MG tablet; Take 1 tablet (100 mg total) by mouth daily.  Type 2 diabetes mellitus with complication, without long-term current use of insulin (HCC)- her A1c is 6.3%, her blood sugars are well-controlled, will continue metformin at the current dose -     Comprehensive metabolic panel; Future -     Hemoglobin  A1c; Future -     Microalbumin / creatinine urine ratio; Future -     Ambulatory referral to Ophthalmology -     losartan (COZAAR) 100 MG tablet; Take 1 tablet (100 mg total) by mouth daily.  Hypokalemia- improvement noted, she will continue potassium replacement therapy -     Comprehensive metabolic panel; Future -     Magnesium; Future  Hyperlipidemia with target LDL less than 100- she has achieved her LDL goal is doing well on the statin -     Lipid panel; Future -     TSH; Future  Acute systolic CHF (congestive heart failure) [facility])- she saw cardiology one day prior to this visit and is been told that she is doing well -     losartan (COZAAR) 100 MG tablet; Take 1 tablet (100 mg total) by mouth daily.  Rheumatoid arthritis involving multiple joints (HCC)- will refer to sports medicine to see if she is a candidate for steroid joint injections -     Ambulatory referral to Sports Medicine  Need for prophylactic vaccination and inoculation against influenza -     Flu Vaccine QUAD 36+ mos IM   I have discontinued Ms. Haire's Chromium-Cinnamon, Glycerin-Hypromellose-PEG 400 (VISINE TEARS OP), and losartan. I am also having her start on losartan. Additionally, I am having her maintain her Fish Oil, aspirin EC, Calcium Carb-Cholecalciferol (CALCIUM 600 + D PO), vitamin C, gabapentin, potassium chloride SA, atorvastatin, leflunomide, hydrochlorothiazide, metFORMIN, albuterol, citalopram, carvedilol, and canagliflozin.  Meds ordered this encounter  Medications  . losartan (COZAAR) 100 MG tablet    Sig: Take 1 tablet (100 mg total) by mouth daily.    Dispense:  90 tablet    Refill:  3   See AVS for instructions about healthy living and anticipatory guidance.  Follow-up: Return in about 6 months (around 09/26/2016).  Sanda Linger, MD

## 2016-03-30 DIAGNOSIS — E785 Hyperlipidemia, unspecified: Secondary | ICD-10-CM | POA: Diagnosis not present

## 2016-03-30 DIAGNOSIS — Z0000 Encounter for general adult medical examination without abnormal findings: Secondary | ICD-10-CM | POA: Diagnosis not present

## 2016-03-30 DIAGNOSIS — Z23 Encounter for immunization: Secondary | ICD-10-CM | POA: Diagnosis not present

## 2016-03-30 DIAGNOSIS — E876 Hypokalemia: Secondary | ICD-10-CM | POA: Diagnosis not present

## 2016-03-30 DIAGNOSIS — E118 Type 2 diabetes mellitus with unspecified complications: Secondary | ICD-10-CM | POA: Diagnosis not present

## 2016-03-30 DIAGNOSIS — I10 Essential (primary) hypertension: Secondary | ICD-10-CM | POA: Diagnosis not present

## 2016-03-31 ENCOUNTER — Other Ambulatory Visit: Payer: Self-pay | Admitting: Internal Medicine

## 2016-03-31 ENCOUNTER — Encounter: Payer: Self-pay | Admitting: Internal Medicine

## 2016-03-31 DIAGNOSIS — Z0000 Encounter for general adult medical examination without abnormal findings: Secondary | ICD-10-CM | POA: Insufficient documentation

## 2016-03-31 DIAGNOSIS — A5901 Trichomonal vulvovaginitis: Secondary | ICD-10-CM | POA: Insufficient documentation

## 2016-03-31 MED ORDER — METRONIDAZOLE 500 MG PO TABS: 2000.0000 mg | ORAL_TABLET | Freq: Once | ORAL | 1 refills | Status: AC

## 2016-03-31 NOTE — Assessment & Plan Note (Signed)

## 2016-04-02 NOTE — Telephone Encounter (Signed)
Patient called in.  Gave her MD response on labs.

## 2016-04-07 NOTE — Progress Notes (Signed)
[name] [address] [place], [place] [address] Phone: [phone] Subjective:    I'm seeing this patient by the request  of:  Sanda Linger, MD   CC: Multiple joint pain Bilateral knees and left  NGE:[id number]  Emily Wheeler is a 60 y.o. female coming in with complaint of multiple joint pain including knee pain. Patient does give history of pain diagnosed with rheumatoid arthritis being treated by multiple rheumatologist over the course of decades. Patient states at the moment she is not being that treated by anyone. Patient states that she is having knee pain that seems to be more on the anterior aspect of the knee. An states that it seems to be getting worse. States that certain activities such as going up stairs can be significantly painful. Patient denies any radiation of pain but does have swelling. Has had multiple injections and aspirations previously. Patient states most the pain does not stop her from activities but can be significantly on painful.     Past Medical History:  Diagnosis Date  . Anemia    mild  . Anxiety   . Arthritis   . Asthma   . Cardiomyopathy (HCC)   . CHF (congestive heart failure) (HCC)   . Diabetes mellitus without complication (HCC)   . Frequency of urination    at night  . GERD (gastroesophageal reflux disease)    potassium worsens reflux  . Hemorrhoids, external   . Hyperlipidemia   . Hypertension   . Neuromuscular disorder (HCC)    neuropathy  . Numbness and tingling of both legs   . Rheumatoid arthritis (HCC)   . Vertigo    hx of   Past Surgical History:  Procedure Laterality Date  . ABDOMINAL HYSTERECTOMY    . CARDIAC CATHETERIZATION N/A 10/28/2014   Procedure: Left Heart Cath and Coronary Angiography;  Surgeon: Corky Crafts, MD;  [facility] CV LAB;  Service: Cardiovascular;  Laterality: N/A;  . CARPAL TUNNEL RELEASE Left   . COLONOSCOPY     pt states 11 yr ago in [place] had a colon  with 2 polyps- one cecal polyp per pt. one ? location  . COLONOSCOPY W/ POLYPECTOMY    . CYST REMOVAL NECK    . HEMORROIDECTOMY    . MAXIMUM ACCESS (MAS)POSTERIOR LUMBAR INTERBODY FUSION (PLIF) 3 LEVEL N/A 10/08/2013   Procedure: FOR MAXIMUM ACCESS (MAS) POSTERIOR LUMBAR INTERBODY FUSION (PLIF) 3 LEVEL;  Surgeon: Maeola Harman, MD;  Location: [facility] NEURO ORS;  Service: Neurosurgery;  Laterality: N/A;  L3-4 L4-5 L5-S1 maximum access posterior lumbar interbody fusion with decompression  . TUBAL LIGATION     Social History   Social History  . Marital status: Single    Spouse name: N/A  . Number of children: N/A  . Years of education: N/A   Social History Main Topics  . Smoking status: Former Games developer  . Smokeless tobacco: Never Used  . Alcohol use Yes     Comment: 3 x a year -rare   . Drug use: No  . Sexual activity: Not Currently   Other Topics Concern  . Not on file   Social History Narrative  . No narrative on file   Allergies  Allergen Reactions  . Lisinopril Cough   Family History  Problem Relation Age of Onset  . Early death Father   . Heart disease Father   . Hypertension Sister   . Hypertension Brother   . Diabetes Brother   .  Alcohol abuse Neg Hx   . COPD Neg Hx   . Depression Neg Hx   . Drug abuse Neg Hx   . Hearing loss Neg Hx   . Hyperlipidemia Neg Hx   . Kidney disease Neg Hx   . Stroke Neg Hx   . Esophageal cancer Neg Hx   . Rectal cancer Neg Hx   . Hashimoto's thyroiditis Sister   . Colon cancer Maternal Grandmother     in her 57s  . Colon polyps Mother   . Stomach cancer Maternal Aunt   . Non-Hodgkin's lymphoma Sister     Past medical history, social, surgical and family history all reviewed in electronic medical record.  No pertanent information unless stated regarding to the chief complaint.   Review of Systems:Review of systems updated and as accurate as of 04/07/16  No headache, visual changes, nausea, vomiting, diarrhea, constipation,  dizziness, abdominal pain, skin rash, fevers, chills, night sweats, weight loss, swollen lymph nodes, body aches, joint swelling, muscle aches, chest pain, shortness of breath, mood changes.   Objective  Last menstrual period 05/29/1991. Systems examined below as of 04/07/16   General: No apparent distress alert and oriented x3 mood and affect normal, dressed appropriately.  HEENT: Pupils equal, extraocular movements intact  Respiratory: Patient's speak in full sentences and does not appear short of breath  Cardiovascular: No lower extremity edema, non tender, no erythema  Skin: Warm dry intact with no signs of infection or rash on extremities or on axial skeleton.  Abdomen: Soft nontender  Neuro: Cranial nerves II through XII are intact, neurovascularly intact in all extremities with 2+ DTRs and 2+ pulses.  Lymph: No lymphadenopathy of posterior or anterior cervical chain or axillae bilaterally.  Gait normal with good balance and coordination.  MSK:  Non tender with full range of motion and good stability and symmetric strength and tone of shoulders, elbows, wrist, hip, and ankles bilaterally.  Knee: Bilateral This fusion of the knees bilaterally Tender to palpation over the patellofemoral joint ROM full in flexion and extension and lower leg rotation. Ligaments with solid consistent endpoints including ACL, PCL, LCL, MCL. Negative Mcmurray's, Apley's, and Thessalonian tests. painful patellar compression. Patellar glide without crepitus. Patellar and quadriceps tendons unremarkable. Hamstring and quadriceps strength is normal.   MSK US performed of: bilateral  This study was ordered, performed, and interpreted by Terrilee Files D.O.  Knee: Significant swelling of the knees in the patella femoral laterall compartment. Nodules vs gout nodules.    IMPRESSION:  KNee effusion with nodules.   Procedure: Real-time Ultrasound Guided Injection of right knee Device: GE Logiq E  Ultrasound  guided injection is preferred based studies that show increased duration, increased effect, greater accuracy, decreased procedural pain, increased response rate, and decreased cost with ultrasound guided versus blind injection.  Verbal informed consent obtained.  Time-out conducted.  Noted no overlying erythema, induration, or other signs of local infection.  Skin prepped in a sterile fashion.  Local anesthesia: Topical Ethyl chloride.  With sterile technique and under real time ultrasound guidance: With a 22-gauge 2 inch needle patient was injected with 4 cc of 0.5% Marcaine and 1 cc of Kenalog 40 mg/dL. This was from a superior lateral approach.  Completed without difficulty  Pain immediately resolved suggesting accurate placement of the medication.  Advised to call if fevers/chills, erythema, induration, drainage, or persistent bleeding.  Images permanently stored and available for review in the ultrasound unit.  Impression: Technically successful ultrasound guided injection.  Procedure: Real-time Ultrasound Guided Injection of left knee Device: GE Logiq E  Ultrasound guided injection is preferred based studies that show increased duration, increased effect, greater accuracy, decreased procedural pain, increased response rate, and decreased cost with ultrasound guided versus blind injection.  Verbal informed consent obtained.  Time-out conducted.  Noted no overlying erythema, induration, or other signs of local infection.  Skin prepped in a sterile fashion.  Local anesthesia: Topical Ethyl chloride.  With sterile technique and under real time ultrasound guidance: With a 22-gauge 2 inch needle patient was injected with 4 cc of 0.5% Marcaine and 1 cc of Kenalog 40 mg/dL. This was from a superior lateral approach.  Completed without difficulty  Pain immediately resolved suggesting accurate placement of the medication.  Advised to call if fevers/chills, erythema, induration, drainage, or  persistent bleeding.  Images permanently stored and available for review in the ultrasound unit.  Impression: Technically successful ultrasound guided injection.    Impression and Recommendations:     This case required medical decision making of moderate complexity.      Note: This dictation was prepared with Dragon dictation along with smaller phrase technology. Any transcriptional errors that result from this process are unintentional.

## 2016-04-09 ENCOUNTER — Other Ambulatory Visit: Payer: Commercial Managed Care - HMO

## 2016-04-09 ENCOUNTER — Ambulatory Visit (INDEPENDENT_AMBULATORY_CARE_PROVIDER_SITE_OTHER): Payer: Commercial Managed Care - HMO | Admitting: Family Medicine

## 2016-04-09 ENCOUNTER — Encounter: Payer: Self-pay | Admitting: Internal Medicine

## 2016-04-09 ENCOUNTER — Ambulatory Visit: Payer: Self-pay

## 2016-04-09 ENCOUNTER — Encounter: Payer: Self-pay | Admitting: Family Medicine

## 2016-04-09 VITALS — BP 142/90 | HR 60 | Ht 65.0 in | Wt 212.0 lb

## 2016-04-09 DIAGNOSIS — H52223 Regular astigmatism, bilateral: Secondary | ICD-10-CM | POA: Diagnosis not present

## 2016-04-09 DIAGNOSIS — M25561 Pain in right knee: Secondary | ICD-10-CM

## 2016-04-09 DIAGNOSIS — M069 Rheumatoid arthritis, unspecified: Secondary | ICD-10-CM

## 2016-04-09 DIAGNOSIS — H43392 Other vitreous opacities, left eye: Secondary | ICD-10-CM | POA: Diagnosis not present

## 2016-04-09 DIAGNOSIS — M25562 Pain in left knee: Secondary | ICD-10-CM

## 2016-04-09 DIAGNOSIS — H2513 Age-related nuclear cataract, bilateral: Secondary | ICD-10-CM | POA: Diagnosis not present

## 2016-04-09 DIAGNOSIS — M25462 Effusion, left knee: Secondary | ICD-10-CM

## 2016-04-09 DIAGNOSIS — M25461 Effusion, right knee: Secondary | ICD-10-CM | POA: Diagnosis not present

## 2016-04-09 DIAGNOSIS — H5203 Hypermetropia, bilateral: Secondary | ICD-10-CM | POA: Diagnosis not present

## 2016-04-09 DIAGNOSIS — H31092 Other chorioretinal scars, left eye: Secondary | ICD-10-CM | POA: Diagnosis not present

## 2016-04-09 DIAGNOSIS — H524 Presbyopia: Secondary | ICD-10-CM | POA: Diagnosis not present

## 2016-04-09 DIAGNOSIS — E119 Type 2 diabetes mellitus without complications: Secondary | ICD-10-CM | POA: Diagnosis not present

## 2016-04-09 LAB — HM DIABETES EYE EXAM

## 2016-04-09 MED ORDER — VITAMIN D (ERGOCALCIFEROL) 1.25 MG (50000 UNIT) PO CAPS: 50000.0000 [IU] | ORAL_CAPSULE | ORAL | 0 refills | Status: DC

## 2016-04-09 NOTE — Assessment & Plan Note (Signed)
Referred to rheum for management.

## 2016-04-09 NOTE — Patient Instructions (Addendum)
Good to see you  Ice 20 minutes 2 times daily. Usually after activity and before bed. pennsaid pinkie amount topically 2 times daily as needed.  Once weekly vitamin D for 12 weeks.  Turmeric 500mg  twice daily  Tart cherry extract any dose at nigh t Rheumatology will be calling you  Good shoes with rigid bottom.  , Merrell or New balance greater then 700 [employer] orthotics "total support" online would be great  See me again in 4 weeks and lets see how you are doing.  I have a lot of tricks if needed.

## 2016-04-09 NOTE — Assessment & Plan Note (Signed)
Bilateral injections given, minimal aspiration, likely mostly a synovisitis secondary to RA.   Encouraged patient seeinf rheumatologist.  Topical nsaids, discussed bracing, discussed icing, discussed proper shoes.  RTC in 4 weeks.

## 2016-04-10 ENCOUNTER — Ambulatory Visit (HOSPITAL_COMMUNITY): Payer: Commercial Managed Care - HMO | Attending: Cardiovascular Disease

## 2016-04-10 ENCOUNTER — Telehealth: Payer: Self-pay | Admitting: Pediatrics

## 2016-04-10 ENCOUNTER — Encounter: Payer: Self-pay | Admitting: Cardiology

## 2016-04-10 ENCOUNTER — Other Ambulatory Visit: Payer: Self-pay

## 2016-04-10 ENCOUNTER — Other Ambulatory Visit: Payer: Self-pay | Admitting: Family Medicine

## 2016-04-10 DIAGNOSIS — I428 Other cardiomyopathies: Secondary | ICD-10-CM

## 2016-04-10 LAB — SYNOVIAL CELL COUNT + DIFF, W/ CRYSTALS: WBC, Synovial: 360 cells/uL — ABNORMAL HIGH (ref <150)

## 2016-04-10 MED ORDER — SPIRONOLACTONE 25 MG PO TABS: 25.0000 mg | ORAL_TABLET | Freq: Every day | ORAL | 3 refills | Status: DC

## 2016-04-10 MED ORDER — ALLOPURINOL 100 MG PO TABS: 200.0000 mg | ORAL_TABLET | Freq: Every day | ORAL | 3 refills | Status: DC

## 2016-04-10 NOTE — Progress Notes (Unsigned)
Well you do have gout as well.  Will start you on allopurionl to help with all your aches and pain.

## 2016-04-10 NOTE — Telephone Encounter (Signed)
rx sent to pharmacy, pt notified via MyChart.

## 2016-04-10 NOTE — Progress Notes (Unsigned)
Allopurinol  

## 2016-04-10 NOTE — Telephone Encounter (Signed)
-----   Message from Lars Masson, MD sent at 04/10/2016  3:30 PM EST ----- LVEF remains moderately decreased 35-40%, I would add spironolactone 25 mg po daily to her regimen. Thank you, KN

## 2016-04-17 LAB — URIC ACID, SYNOVIAL FLUID

## 2016-05-07 ENCOUNTER — Ambulatory Visit: Payer: Commercial Managed Care - HMO | Admitting: Family Medicine

## 2016-05-16 ENCOUNTER — Other Ambulatory Visit: Payer: Self-pay | Admitting: Internal Medicine

## 2016-05-16 DIAGNOSIS — M48061 Spinal stenosis, lumbar region without neurogenic claudication: Secondary | ICD-10-CM

## 2016-05-25 ENCOUNTER — Other Ambulatory Visit: Payer: Self-pay | Admitting: Internal Medicine

## 2016-05-25 DIAGNOSIS — E118 Type 2 diabetes mellitus with unspecified complications: Secondary | ICD-10-CM

## 2016-06-07 ENCOUNTER — Telehealth: Payer: Self-pay

## 2016-06-07 NOTE — Telephone Encounter (Signed)
Received form from patient: Juror Qualification Questionnaire  Patient has indicated that she is disabled due to rheumatoid arthritis, doesn't walk well and CHF.   Reviewing the form there is not a place for doctor signature and there is nothing documented that physician needs to provide a separate letter of patients disability.  Contacted patient and informed of the same. Also included that they may need some documentation from [place] at a later date.

## 2016-06-19 ENCOUNTER — Other Ambulatory Visit: Payer: Self-pay | Admitting: Internal Medicine

## 2016-06-19 DIAGNOSIS — M069 Rheumatoid arthritis, unspecified: Secondary | ICD-10-CM

## 2016-07-06 ENCOUNTER — Other Ambulatory Visit: Payer: Self-pay | Admitting: Internal Medicine

## 2016-07-24 ENCOUNTER — Other Ambulatory Visit: Payer: Self-pay | Admitting: Internal Medicine

## 2016-07-24 DIAGNOSIS — E785 Hyperlipidemia, unspecified: Secondary | ICD-10-CM

## 2016-07-24 DIAGNOSIS — E118 Type 2 diabetes mellitus with unspecified complications: Secondary | ICD-10-CM

## 2016-07-26 DIAGNOSIS — M7989 Other specified soft tissue disorders: Secondary | ICD-10-CM | POA: Diagnosis not present

## 2016-07-26 DIAGNOSIS — M2550 Pain in unspecified joint: Secondary | ICD-10-CM | POA: Diagnosis not present

## 2016-07-26 DIAGNOSIS — R5383 Other fatigue: Secondary | ICD-10-CM | POA: Diagnosis not present

## 2016-07-26 DIAGNOSIS — M0579 Rheumatoid arthritis with rheumatoid factor of multiple sites without organ or systems involvement: Secondary | ICD-10-CM | POA: Diagnosis not present

## 2016-07-26 DIAGNOSIS — M25552 Pain in left hip: Secondary | ICD-10-CM | POA: Diagnosis not present

## 2016-07-26 DIAGNOSIS — M1A09X0 Idiopathic chronic gout, multiple sites, without tophus (tophi): Secondary | ICD-10-CM | POA: Diagnosis not present

## 2016-07-26 DIAGNOSIS — M25551 Pain in right hip: Secondary | ICD-10-CM | POA: Diagnosis not present

## 2016-08-04 ENCOUNTER — Other Ambulatory Visit: Payer: Self-pay | Admitting: Family Medicine

## 2016-08-09 DIAGNOSIS — M1A09X0 Idiopathic chronic gout, multiple sites, without tophus (tophi): Secondary | ICD-10-CM | POA: Diagnosis not present

## 2016-08-09 DIAGNOSIS — M5136 Other intervertebral disc degeneration, lumbar region: Secondary | ICD-10-CM | POA: Diagnosis not present

## 2016-08-09 DIAGNOSIS — M0579 Rheumatoid arthritis with rheumatoid factor of multiple sites without organ or systems involvement: Secondary | ICD-10-CM | POA: Diagnosis not present

## 2016-08-09 DIAGNOSIS — M2550 Pain in unspecified joint: Secondary | ICD-10-CM | POA: Diagnosis not present

## 2016-08-09 DIAGNOSIS — M150 Primary generalized (osteo)arthritis: Secondary | ICD-10-CM | POA: Diagnosis not present

## 2016-09-11 ENCOUNTER — Other Ambulatory Visit: Payer: Self-pay | Admitting: Internal Medicine

## 2016-09-11 DIAGNOSIS — I10 Essential (primary) hypertension: Secondary | ICD-10-CM

## 2016-09-20 DIAGNOSIS — M1A09X0 Idiopathic chronic gout, multiple sites, without tophus (tophi): Secondary | ICD-10-CM | POA: Diagnosis not present

## 2016-09-20 DIAGNOSIS — M150 Primary generalized (osteo)arthritis: Secondary | ICD-10-CM | POA: Diagnosis not present

## 2016-09-20 DIAGNOSIS — M2550 Pain in unspecified joint: Secondary | ICD-10-CM | POA: Diagnosis not present

## 2016-09-20 DIAGNOSIS — M0579 Rheumatoid arthritis with rheumatoid factor of multiple sites without organ or systems involvement: Secondary | ICD-10-CM | POA: Diagnosis not present

## 2016-09-20 DIAGNOSIS — M5136 Other intervertebral disc degeneration, lumbar region: Secondary | ICD-10-CM | POA: Diagnosis not present

## 2016-10-10 ENCOUNTER — Other Ambulatory Visit: Payer: Self-pay | Admitting: Internal Medicine

## 2016-10-22 ENCOUNTER — Other Ambulatory Visit: Payer: Self-pay | Admitting: Internal Medicine

## 2016-10-22 DIAGNOSIS — E118 Type 2 diabetes mellitus with unspecified complications: Secondary | ICD-10-CM

## 2016-10-31 ENCOUNTER — Ambulatory Visit (HOSPITAL_COMMUNITY)
Admission: RE | Admit: 2016-10-31 | Discharge: 2016-10-31 | Disposition: A | Discharge status: Home / Self Care | Payer: Medicare Other | Source: Ambulatory Visit | Attending: Vascular Surgery | Admitting: Vascular Surgery

## 2016-10-31 ENCOUNTER — Ambulatory Visit (INDEPENDENT_AMBULATORY_CARE_PROVIDER_SITE_OTHER): Payer: Medicare Other | Admitting: Internal Medicine

## 2016-10-31 ENCOUNTER — Other Ambulatory Visit (INDEPENDENT_AMBULATORY_CARE_PROVIDER_SITE_OTHER): Payer: Medicare Other

## 2016-10-31 VITALS — BP 120/80 | HR 80 | Temp 98.1°F | Resp 16 | Ht 65.0 in | Wt 206.0 lb

## 2016-10-31 DIAGNOSIS — E118 Type 2 diabetes mellitus with unspecified complications: Secondary | ICD-10-CM | POA: Diagnosis not present

## 2016-10-31 DIAGNOSIS — I739 Peripheral vascular disease, unspecified: Secondary | ICD-10-CM | POA: Diagnosis not present

## 2016-10-31 DIAGNOSIS — Z0000 Encounter for general adult medical examination without abnormal findings: Secondary | ICD-10-CM

## 2016-10-31 DIAGNOSIS — I255 Ischemic cardiomyopathy: Secondary | ICD-10-CM

## 2016-10-31 DIAGNOSIS — I10 Essential (primary) hypertension: Secondary | ICD-10-CM

## 2016-10-31 DIAGNOSIS — E785 Hyperlipidemia, unspecified: Secondary | ICD-10-CM

## 2016-10-31 DIAGNOSIS — Z1159 Encounter for screening for other viral diseases: Secondary | ICD-10-CM

## 2016-10-31 DIAGNOSIS — R0609 Other forms of dyspnea: Secondary | ICD-10-CM

## 2016-10-31 DIAGNOSIS — Z1231 Encounter for screening mammogram for malignant neoplasm of breast: Secondary | ICD-10-CM

## 2016-10-31 LAB — CBC WITH DIFFERENTIAL/PLATELET
BASOS ABS: 0.1 10*3/uL (ref 0.0–0.1)
Basophils Relative: 0.6 % (ref 0.0–3.0)
Eosinophils Absolute: 0 10*3/uL (ref 0.0–0.7)
Eosinophils Relative: 0 % (ref 0.0–5.0)
HCT: 38.4 % (ref 36.0–46.0)
Hemoglobin: 13 g/dL (ref 12.0–15.0)
LYMPHS ABS: 2.6 10*3/uL (ref 0.7–4.0)
LYMPHS PCT: 25.4 % (ref 12.0–46.0)
MCHC: 33.8 g/dL (ref 30.0–36.0)
MCV: 97 fl (ref 78.0–100.0)
MONOS PCT: 7.2 % (ref 3.0–12.0)
Monocytes Absolute: 0.7 10*3/uL (ref 0.1–1.0)
NEUTROS PCT: 66.8 % (ref 43.0–77.0)
Neutro Abs: 6.8 10*3/uL (ref 1.4–7.7)
Platelets: 288 10*3/uL (ref 150.0–400.0)
RBC: 3.96 Mil/uL (ref 3.87–5.11)
RDW: 15.9 % — ABNORMAL HIGH (ref 11.5–15.5)
WBC: 10.2 10*3/uL (ref 4.0–10.5)

## 2016-10-31 LAB — COMPREHENSIVE METABOLIC PANEL
ALK PHOS: 85 U/L (ref 39–117)
ALT: 16 U/L (ref 0–35)
AST: 10 U/L (ref 0–37)
Albumin: 4.5 g/dL (ref 3.5–5.2)
BILIRUBIN TOTAL: 0.4 mg/dL (ref 0.2–1.2)
BUN: 30 mg/dL — ABNORMAL HIGH (ref 6–23)
CALCIUM: 10.2 mg/dL (ref 8.4–10.5)
CO2: 23 mEq/L (ref 19–32)
Chloride: 98 mEq/L (ref 96–112)
Creatinine, Ser: 1.26 mg/dL — ABNORMAL HIGH (ref 0.40–1.20)
GFR: 55.22 mL/min — ABNORMAL LOW (ref >60.00)
Glucose, Bld: 311 mg/dL — ABNORMAL HIGH (ref 70–99)
Potassium: 4.6 mEq/L (ref 3.5–5.1)
Sodium: 133 mEq/L — ABNORMAL LOW (ref 135–145)
TOTAL PROTEIN: 7.4 g/dL (ref 6.0–8.3)

## 2016-10-31 LAB — LIPID PANEL
Cholesterol: 194 mg/dL (ref 0–200)
HDL: 51.1 mg/dL (ref >39.00)
LDL Cholesterol: 105 mg/dL — ABNORMAL HIGH (ref 0–99)
NonHDL: 143.21
TRIGLYCERIDES: 190 mg/dL — AB (ref 0.0–149.0)
Total CHOL/HDL Ratio: 4
VLDL: 38 mg/dL (ref 0.0–40.0)

## 2016-10-31 LAB — TROPONIN I: TNIDX: 0.02 ug/l (ref 0.00–0.06)

## 2016-10-31 LAB — TSH: TSH: 1.76 u[IU]/mL (ref 0.35–4.50)

## 2016-10-31 LAB — HEPATITIS C ANTIBODY: HCV AB: NEGATIVE

## 2016-10-31 LAB — HEMOGLOBIN A1C: HEMOGLOBIN A1C: 10.8 % — AB (ref 4.6–6.5)

## 2016-10-31 LAB — BRAIN NATRIURETIC PEPTIDE: PRO B NATRI PEPTIDE: 15 pg/mL (ref 0.0–100.0)

## 2016-10-31 MED ORDER — METFORMIN HCL 500 MG PO TABS: 500.0000 mg | ORAL_TABLET | Freq: Two times a day (BID) | ORAL | 1 refills | Status: DC

## 2016-10-31 MED ORDER — INSULIN PEN NEEDLE 32G X 6 MM MISC: 2.0000 | Freq: Every day | 11 refills | Status: DC

## 2016-10-31 MED ORDER — GLUCOSE BLOOD VI STRP: ORAL_STRIP | 12 refills | Status: DC

## 2016-10-31 MED ORDER — ACCU-CHEK GUIDE W/DEVICE KIT: 1.0000 | PACK | Freq: Three times a day (TID) | 0 refills | Status: DC

## 2016-10-31 MED ORDER — EXENATIDE ER 2 MG/0.85ML ~~LOC~~ AUIJ: 1.0000 | AUTO-INJECTOR | SUBCUTANEOUS | 11 refills | Status: DC

## 2016-10-31 MED ORDER — INSULIN GLARGINE 300 UNIT/ML ~~LOC~~ SOPN: 30.0000 [IU] | PEN_INJECTOR | Freq: Every day | SUBCUTANEOUS | 5 refills | Status: DC

## 2016-10-31 NOTE — Patient Instructions (Signed)

## 2016-10-31 NOTE — Progress Notes (Signed)
Subjective:  Patient ID: Emily Wheeler, female    DOB: Feb 13, 1956  Age: 61 y.o. MRN: [id number]  CC: Hypertension; Hyperlipidemia; and Diabetes   HPI Emily Wheeler presents for a f/up.  She complains of a several month history of DOE, she has had no episodes of chest pain/diaphoresis/fatigue/palpitations/edema. She also complains of cramping in her toes. She notices the toe cramping mostly at rest but she also has some discomfort with activity. She has not recently been monitoring her blood sugars.  Past Medical History:  Diagnosis Date  . Anemia    mild  . Anxiety   . Arthritis   . Asthma   . Cardiomyopathy ([place])   . CHF (congestive heart failure) ([place])   . Diabetes mellitus without complication ([place])   . Frequency of urination    at night  . GERD (gastroesophageal reflux disease)    potassium worsens reflux  . Hemorrhoids, external   . Hyperlipidemia   . Hypertension   . Neuromuscular disorder (HCC)    neuropathy  . Numbness and tingling of both legs   . Rheumatoid arthritis ([place])   . Vertigo    hx of   Past Surgical History:  Procedure Laterality Date  . ABDOMINAL HYSTERECTOMY    . CARDIAC CATHETERIZATION N/A 10/28/2014   Procedure: Left Heart Cath and Coronary Angiography;  Surgeon: Jettie Booze, MD;  Location: [facility];  Service: Cardiovascular;  Laterality: N/A;  . CARPAL TUNNEL RELEASE Left   . COLONOSCOPY     pt states 11 yr ago in [place] had a colon with 2 polyps- one cecal polyp per pt. one ? location  . COLONOSCOPY W/ POLYPECTOMY    . CYST REMOVAL NECK    . HEMORROIDECTOMY    . MAXIMUM ACCESS (MAS)POSTERIOR LUMBAR INTERBODY FUSION (PLIF) 3 LEVEL N/A 10/08/2013   Procedure: FOR MAXIMUM ACCESS (MAS) POSTERIOR LUMBAR INTERBODY FUSION (PLIF) 3 LEVEL;  Surgeon: Erline Levine, MD;  Location: [facility];  Service: Neurosurgery;  Laterality: N/A;  L3-4 L4-5 L5-S1 maximum access posterior lumbar interbody fusion with decompression  . TUBAL LIGATION       reports that she has quit smoking. She has never used smokeless tobacco. She reports that she drinks alcohol. She reports that she does not use drugs. family history includes Colon cancer in her maternal grandmother; Colon polyps in her mother; Diabetes in her brother; Early death in her father; Hashimoto's thyroiditis in her sister; Heart disease in her father; Hypertension in her brother and sister; Non-Hodgkin's lymphoma in her sister; Stomach cancer in her maternal aunt. Allergies  Allergen Reactions  . Lisinopril Cough    Outpatient Medications Prior to Visit  Medication Sig Dispense Refill  . albuterol (PROVENTIL HFA;VENTOLIN HFA) 108 (90 Base) MCG/ACT inhaler Inhale 1-2 puffs into the lungs every 6 (six) hours as needed for wheezing or shortness of breath. 18 g 11  . allopurinol (ZYLOPRIM) 100 MG tablet TAKE TWO TABLETS BY MOUTH ONCE DAILY 60 tablet 3  . aspirin EC 81 MG tablet Take 1 tablet (81 mg total) by mouth daily. 90 tablet 3  . atorvastatin (LIPITOR) 40 MG tablet TAKE ONE TABLET BY MOUTH ONCE DAILY 90 tablet 3  . Calcium Carb-Cholecalciferol (CALCIUM 600 + D PO) Take 1 tablet by mouth daily.    . carvedilol (COREG) 6.25 MG tablet TAKE ONE TABLET BY MOUTH TWICE DAILY WITH A MEAL 180 tablet 0  . citalopram (CELEXA) 20 MG tablet Take 1 tablet (20 mg total)  by mouth daily. 90 tablet 1  . gabapentin (NEURONTIN) 600 MG tablet TAKE ONE TABLET BY MOUTH THREE TIMES DAILY 270 tablet 3  . hydrochlorothiazide (HYDRODIURIL) 25 MG tablet Take 1 tablet (25 mg total) by mouth daily. Appt needed for refills. 30 tablet 0  . losartan (COZAAR) 100 MG tablet Take 1 tablet (100 mg total) by mouth daily. 90 tablet 3  . Omega-3 Fatty Acids (FISH OIL) 1200 MG CAPS Take 1,200 mg by mouth daily.     . potassium chloride SA (K-DUR,KLOR-CON) 20 MEQ tablet Take 1 tablet (20 mEq total) by mouth 2 (two) times daily. 60 tablet 5  . vitamin C (ASCORBIC ACID) 500 MG tablet Take 500 mg by mouth daily as  needed (for cold symptoms).    . Vitamin D, Ergocalciferol, (DRISDOL) 50000 units CAPS capsule Take 1 capsule (50,000 Units total) by mouth every 7 (seven) days. 12 capsule 0  . canagliflozin (INVOKANA) 100 MG TABS tablet Take 1 tablet (100 mg total) by mouth daily before breakfast. 90 tablet 1  . metFORMIN (GLUCOPHAGE) 500 MG tablet TAKE ONE TABLET BY MOUTH TWICE DAILY WITH A MEAL 180 tablet 1  . spironolactone (ALDACTONE) 25 MG tablet Take 1 tablet (25 mg total) by mouth daily. 90 tablet 3  . leflunomide (ARAVA) 20 MG tablet TAKE ONE TABLET BY MOUTH ONCE DAILY 90 tablet 1   No facility-administered medications prior to visit.     ROS Review of Systems  Constitutional: Negative.  Negative for activity change, appetite change, diaphoresis, fatigue and unexpected weight change.  HENT: Negative.  Negative for trouble swallowing.   Eyes: Negative for visual disturbance.  Respiratory: Positive for shortness of breath (DOE). Negative for cough, choking, chest tightness, wheezing and stridor.   Cardiovascular: Negative.  Negative for chest pain, palpitations and leg swelling.  Gastrointestinal: Negative for abdominal pain, blood in stool, constipation, diarrhea, nausea and vomiting.  Endocrine: Negative for polydipsia, polyphagia and polyuria.  Genitourinary: Negative.  Negative for decreased urine volume, difficulty urinating, dysuria, frequency and urgency.  Musculoskeletal: Positive for arthralgias (hips). Negative for gait problem, myalgias and neck pain.  Skin: Negative.  Negative for color change.  Neurological: Negative.  Negative for dizziness, weakness, numbness and headaches.  Hematological: Negative.  Negative for adenopathy. Does not bruise/bleed easily.  Psychiatric/Behavioral: Negative.     Objective:  BP 120/80   Pulse 80   Temp 98.1 F (36.7 C)   Resp 16   Ht 5' 5"  (1.651 m)   Wt 206 lb (93.4 kg)   LMP 05/29/1991   SpO2 98%   BMI 34.28 kg/m   BP Readings from Last 3  Encounters:  10/31/16 120/80  04/09/16 (!) 142/90  03/29/16 (!) 150/78    Wt Readings from Last 3 Encounters:  10/31/16 206 lb (93.4 kg)  04/09/16 212 lb (96.2 kg)  03/29/16 210 lb 3 oz (95.3 kg)    Physical Exam  Constitutional: She is oriented to person, place, and time. No distress.  HENT:  Mouth/Throat: Oropharynx is clear and moist. No oropharyngeal exudate.  Eyes: Conjunctivae are normal. Right eye exhibits no discharge. Left eye exhibits no discharge. No scleral icterus.  Neck: Normal range of motion. Neck supple. No JVD present. No tracheal deviation present. No thyromegaly present.  Cardiovascular: Normal rate, regular rhythm and normal heart sounds.  Exam reveals no gallop.   No murmur heard. Pulses:      Carotid pulses are 1+ on the right side, and 1+ on the left side.  Radial pulses are 1+ on the right side, and 1+ on the left side.       Femoral pulses are 1+ on the right side, and 1+ on the left side.      Popliteal pulses are 0 on the right side, and 0 on the left side.       Dorsalis pedis pulses are 0 on the right side, and 0 on the left side.       Posterior tibial pulses are 0 on the right side, and 0 on the left side.  EKG- Sinus  Rhythm  -Left axis -anterior fascicular block.   Voltage criteria for LVH  (R(aVL) exceeds 1.26 mV).   -  Nonspecific T-abnormality. T-wave changes have improved compared to the prior EKG done about 8 months ago that showed T-wave inversion in the inferior lateral leads.  ABNORMAL   Pulmonary/Chest: Effort normal and breath sounds normal. No stridor. No respiratory distress. She has no wheezes. She has no rales. She exhibits no tenderness.  Abdominal: Soft. Bowel sounds are normal. She exhibits no distension and no mass. There is no tenderness. There is no guarding.  Musculoskeletal: Normal range of motion. She exhibits no edema, tenderness or deformity.  Lymphadenopathy:    She has no cervical adenopathy.  Neurological: She is  alert and oriented to person, place, and time.  Skin: Skin is warm and dry. No rash noted. She is not diaphoretic. No erythema. No pallor.  Vitals reviewed.   Lab Results  Component Value Date   WBC 10.2 10/31/2016   HGB 13.0 10/31/2016   HCT 38.4 10/31/2016   PLT 288.0 10/31/2016   GLUCOSE 311 (H) 10/31/2016   CHOL 194 10/31/2016   TRIG 190.0 (H) 10/31/2016   HDL 51.10 10/31/2016   LDLDIRECT 162.5 01/30/2013   LDLCALC 105 (H) 10/31/2016   ALT 16 10/31/2016   AST 10 10/31/2016   NA 133 (L) 10/31/2016   K 4.6 10/31/2016   CL 98 10/31/2016   CREATININE 1.26 (H) 10/31/2016   BUN 30 (H) 10/31/2016   CO2 23 10/31/2016   TSH 1.76 10/31/2016   INR 0.9 10/26/2014   HGBA1C 10.8 (H) 10/31/2016   MICROALBUR 1.0 03/29/2016    Mm Screening Breast Tomo Bilateral  Result Date: 09/20/2015 CLINICAL DATA:  Screening. EXAM: 2D DIGITAL SCREENING BILATERAL MAMMOGRAM WITH CAD AND ADJUNCT TOMO COMPARISON:  Previous exam(s). ACR Breast Density Category a: The breast tissue is almost entirely fatty. FINDINGS: There are no findings suspicious for malignancy. Images were processed with CAD. IMPRESSION: No mammographic evidence of malignancy. A result letter of this screening mammogram will be mailed directly to the patient. RECOMMENDATION: Screening mammogram in one year. (Code:SM-B-01Y) BI-RADS CATEGORY  1: Negative. Electronically Signed   By: Lajean Manes M.D.   On: 09/20/2015 14:27    Assessment & Plan:   Emily Wheeler was seen today for annual exam, hypertension, hyperlipidemia and diabetes.  Diagnoses and all orders for this visit:  Essential hypertension, benign- her blood pressure is well-controlled, electrolytes are normal, as a slight decrease in her renal function. She will avoid nephrotoxic agents. -     Comprehensive metabolic panel; Future -     CBC with Differential/Platelet; Future  Type 2 diabetes mellitus with complication, without long-term current use of insulin ([place])- her A1c is up  to 10.8%, her blood sugars are not well-controlled. It appears that she has developed PAD and with the concerns for amputations with Invokana I've asked her to stop taking it. Will  continue metformin at the current dose. Will try to get better blood sugar control with a GLP-1 agonist and basal insulin. -     Hemoglobin A1c; Future -     Comprehensive metabolic panel; Future -     Blood Glucose Monitoring Suppl (ACCU-CHEK GUIDE) w/Device KIT; 1 Act by Does not apply route 3 (three) times daily. -     glucose blood (ACCU-CHEK GUIDE) test strip; Use TID -     metFORMIN (GLUCOPHAGE) 500 MG tablet; Take 1 tablet (500 mg total) by mouth 2 (two) times daily with a meal. -     Amb Referral to Nutrition and Diabetic E -     Exenatide ER (BYDUREON BCISE) 2 MG/0.85ML AUIJ; Inject 1 Act into the skin once a week. -     Insulin Glargine (TOUJEO SOLOSTAR) 300 UNIT/ML SOPN; Inject 30 Units into the skin daily. -     Insulin Pen Needle (NOVOFINE) 32G X 6 MM MISC; 2 Act by Does not apply route daily.  Hyperlipidemia with target LDL less than 100- she has achieved her LDL goal is doing well on the statin. -     Lipid panel; Future -     TSH; Future  Need for hepatitis C screening test -     Hepatitis C antibody; Future  Routine general medical examination at a health care facility -     Cancel: Comprehensive metabolic panel; Future -     Cancel: CBC with Differential/Platelet; Future -     Cancel: Lipid panel; Future -     Cancel: TSH; Future  Ischemic cardiomyopathy- as below -     Brain natriuretic peptide; Future  DOE (dyspnea on exertion)- she has new onset DOE but her EKG is improved compared to the prior EKG, her troponin level and BNP are reassuring . I don't see any evidence of fluid overload or ischemia. This may be related to obesity and poor conditioning. She will follow-up soon with her cardiologist and will consider improving her lifestyle modifications. -     Troponin I; Future -     Brain  natriuretic peptide; Future -     EKG 12-Lead  PAD (peripheral artery disease) ([place])- I've ordered ABIs to see how severe this is. -     VAS [place] ABI WITH/WO TBI; Future -     EKG 12-Lead  Visit for screening mammogram -     MM DIGITAL SCREENING BILATERAL; Future   I have discontinued Emily Wheeler canagliflozin and leflunomide. I have also changed her metFORMIN. Additionally, I am having her start on ACCU-CHEK GUIDE, glucose blood, Exenatide ER, and Insulin Glargine. Lastly, I am having her maintain her Fish Oil, aspirin EC, Calcium Carb-Cholecalciferol (CALCIUM 600 + D PO), vitamin C, potassium chloride SA, albuterol, citalopram, losartan, Vitamin D (Ergocalciferol), spironolactone, gabapentin, atorvastatin, allopurinol, carvedilol, hydrochlorothiazide, methotrexate, and predniSONE.  Meds ordered this encounter  Medications  . methotrexate 2.5 MG tablet  . predniSONE (DELTASONE) 10 MG tablet  . DISCONTD: spironolactone (ALDACTONE) 25 MG tablet  . Blood Glucose Monitoring Suppl (ACCU-CHEK GUIDE) w/Device KIT    Sig: 1 Act by Does not apply route 3 (three) times daily.    Dispense:  2 kit    Refill:  0  . glucose blood (ACCU-CHEK GUIDE) test strip    Sig: Use TID    Dispense:  100 each    Refill:  12  . metFORMIN (GLUCOPHAGE) 500 MG tablet    Sig: Take 1 tablet (500 mg total)  by mouth 2 (two) times daily with a meal.    Dispense:  180 tablet    Refill:  1  . Exenatide ER (BYDUREON BCISE) 2 MG/0.85ML AUIJ    Sig: Inject 1 Act into the skin once a week.    Dispense:  4 pen    Refill:  11  . Insulin Glargine (TOUJEO SOLOSTAR) 300 UNIT/ML SOPN    Sig: Inject 30 Units into the skin daily.    Dispense:  3 mL    Refill:  5  . DISCONTD: Insulin Pen Needle (NOVOFINE) 32G X 6 MM MISC    Sig: 2 Act by Does not apply route daily.    Dispense:  100 each    Refill:  11     Follow-up: Return in about 3 months (around 01/31/2017).  Scarlette Calico, MD

## 2016-11-01 ENCOUNTER — Encounter: Payer: Self-pay | Admitting: Internal Medicine

## 2016-11-01 ENCOUNTER — Telehealth: Payer: Self-pay

## 2016-11-01 ENCOUNTER — Ambulatory Visit (INDEPENDENT_AMBULATORY_CARE_PROVIDER_SITE_OTHER): Payer: Medicare Other | Admitting: General Practice

## 2016-11-01 ENCOUNTER — Encounter: Payer: Self-pay | Admitting: General Practice

## 2016-11-01 DIAGNOSIS — Z7189 Other specified counseling: Secondary | ICD-10-CM

## 2016-11-01 MED ORDER — INSULIN PEN NEEDLE 32G X 6 MM MISC: 3 refills | Status: DC

## 2016-11-01 NOTE — Telephone Encounter (Signed)
Novofine is no longer available. ERX sent in for the BD Ultra fine micro for 56mm x 47mm

## 2016-11-01 NOTE — Progress Notes (Signed)
Educated and supervised patient on insulin injection.

## 2016-11-02 NOTE — Progress Notes (Signed)
Nurse visit

## 2016-11-05 ENCOUNTER — Encounter: Payer: Self-pay | Admitting: Internal Medicine

## 2016-11-08 ENCOUNTER — Other Ambulatory Visit: Payer: Self-pay | Admitting: Internal Medicine

## 2016-11-19 ENCOUNTER — Encounter: Payer: Self-pay | Admitting: Cardiology

## 2016-12-04 ENCOUNTER — Other Ambulatory Visit: Payer: Self-pay | Admitting: Family Medicine

## 2016-12-04 NOTE — Telephone Encounter (Signed)
Refill done.  

## 2016-12-05 ENCOUNTER — Ambulatory Visit
Admission: RE | Admit: 2016-12-05 | Discharge: 2016-12-05 | Disposition: A | Discharge status: Home / Self Care | Payer: Medicare Other | Source: Ambulatory Visit | Attending: Internal Medicine | Admitting: Internal Medicine

## 2016-12-05 ENCOUNTER — Other Ambulatory Visit: Payer: Self-pay | Admitting: Internal Medicine

## 2016-12-05 DIAGNOSIS — Z1231 Encounter for screening mammogram for malignant neoplasm of breast: Secondary | ICD-10-CM

## 2016-12-06 LAB — HM MAMMOGRAPHY

## 2016-12-13 ENCOUNTER — Other Ambulatory Visit: Payer: Self-pay | Admitting: Internal Medicine

## 2016-12-13 DIAGNOSIS — I10 Essential (primary) hypertension: Secondary | ICD-10-CM

## 2016-12-24 DIAGNOSIS — M2550 Pain in unspecified joint: Secondary | ICD-10-CM | POA: Diagnosis not present

## 2016-12-24 DIAGNOSIS — M1A09X0 Idiopathic chronic gout, multiple sites, without tophus (tophi): Secondary | ICD-10-CM | POA: Diagnosis not present

## 2016-12-24 DIAGNOSIS — M150 Primary generalized (osteo)arthritis: Secondary | ICD-10-CM | POA: Diagnosis not present

## 2016-12-24 DIAGNOSIS — M5136 Other intervertebral disc degeneration, lumbar region: Secondary | ICD-10-CM | POA: Diagnosis not present

## 2016-12-24 DIAGNOSIS — M0579 Rheumatoid arthritis with rheumatoid factor of multiple sites without organ or systems involvement: Secondary | ICD-10-CM | POA: Diagnosis not present

## 2016-12-28 ENCOUNTER — Other Ambulatory Visit: Payer: Self-pay | Admitting: Family Medicine

## 2016-12-31 NOTE — Telephone Encounter (Signed)
Refill done.  

## 2017-01-14 ENCOUNTER — Ambulatory Visit: Payer: Commercial Managed Care - HMO | Admitting: Cardiology

## 2017-01-16 ENCOUNTER — Encounter: Payer: Self-pay | Admitting: Cardiology

## 2017-01-16 ENCOUNTER — Ambulatory Visit (INDEPENDENT_AMBULATORY_CARE_PROVIDER_SITE_OTHER): Payer: Medicare Other | Admitting: Cardiology

## 2017-01-16 VITALS — BP 116/60 | HR 84 | Ht 65.0 in | Wt 211.4 lb

## 2017-01-16 DIAGNOSIS — I428 Other cardiomyopathies: Secondary | ICD-10-CM | POA: Diagnosis not present

## 2017-01-16 DIAGNOSIS — I5021 Acute systolic (congestive) heart failure: Secondary | ICD-10-CM | POA: Diagnosis not present

## 2017-01-16 DIAGNOSIS — I10 Essential (primary) hypertension: Secondary | ICD-10-CM | POA: Diagnosis not present

## 2017-01-16 DIAGNOSIS — E118 Type 2 diabetes mellitus with unspecified complications: Secondary | ICD-10-CM | POA: Diagnosis not present

## 2017-01-16 DIAGNOSIS — I2510 Atherosclerotic heart disease of native coronary artery without angina pectoris: Secondary | ICD-10-CM | POA: Diagnosis not present

## 2017-01-16 MED ORDER — LOSARTAN POTASSIUM 100 MG PO TABS: 100.0000 mg | ORAL_TABLET | Freq: Every day | ORAL | 3 refills | Status: DC

## 2017-01-16 NOTE — Patient Instructions (Signed)
Medication Instructions: - Your physician recommends that you continue on your current medications as directed. Please refer to the Current Medication list given to you today.  Labwork: - none ordered  Procedures/Testing: - none ordered  Follow-Up: - Your physician wants you to follow-up in: 6 months with Dr. Delton See.  You will receive a reminder letter in the mail two months in advance. If you don't receive a letter, please call our office to schedule the follow-up appointment.  Any Additional Special Instructions Will Be Listed Below (If Applicable).     If you need a refill on your cardiac medications before your next appointment, please call your pharmacy.

## 2017-01-16 NOTE — Progress Notes (Signed)
01/16/2017 KENDAHL BUMGARDNER   03-May-1956  [id number]  Primary Physician [name], MD Primary Cardiologist: Dr. Meda Coffee    Reason for Visit/CC: 6 month f/u for NICM  HPI:  Emily Wheeler is a 61 y.o. female, followed by Dr. Meda Coffee, who presents to clinic today for routine follow-up. She has a history of nonischemic cardiomyopathy that was diagnosed in 2016. She initially presented with a complaint of dyspnea. Echocardiogram in April 2016 showed reduced EF at 35-40%. She initially underwent a nuclear stress test which was abnormal, leading to cardiac catheterization. Cardiac cath in June 2016 showed mild CAD thus a diagnosis of nonischemic cardiomyopathy was made. Her additional past medical history includes hypertension, hyperlipidemia and diabetes. She also has rheumatoid arthritis. Her recent follow-up echocardiogram was 04/10/2016. This continued to show reduced left ventricular ejection fraction at 35-40%. Grade 1 diastolic dysfunction was also noted with diffuse hypokinesis. No valvular abnormalities were noted.  She was last seen by Dr. Meda Coffee November 2017. At that time, she was without complaints however her blood pressure was noted to be elevated and her losartan was increased to 100 mg daily. No other changes made. She presents back to clinic for 6 month follow-up.  Today in clinic she reports that she done well from a cardiac standpoint. She denies any chest pain or dyspnea. No exertional symptoms. No palpitations, lightheadedness, dizziness, syncope/near-syncope. She also denies lower extremity edema, orthopnea and PND. She reports full medication compliance.  Her only recent issues have been problems with rheumatoid arthritis. Her rheumatologist placed her on prednisone in June however she reports that she did not tolerate this well. This resulted in elevations in her blood glucose levels. She had a BMP done on 10/31/2016 which also showed a bump in her serum creatinine to 1.26.  She reports that she was seen by her rheumatologist last week, Dr. Amil Amen, and she had repeat laboratory work done at that time. These were results are not available to review however she was told that everything checked out okay and was stable. We will request that these results be faxed to our office for review.  In clinic today, her blood pressure is well-controlled at 118/60. Physical exam is benign. She is w/o cardiac symptoms.   Current Meds  Medication Sig  . albuterol (PROVENTIL HFA;VENTOLIN HFA) 108 (90 Base) MCG/ACT inhaler Inhale 1-2 puffs into the lungs every 6 (six) hours as needed for wheezing or shortness of breath.  . allopurinol (ZYLOPRIM) 100 MG tablet TAKE 2 TABLETS BY MOUTH ONCE DAILY  . aspirin EC 81 MG tablet Take 1 tablet (81 mg total) by mouth daily.  Marland Kitchen atorvastatin (LIPITOR) 40 MG tablet TAKE ONE TABLET BY MOUTH ONCE DAILY  . Blood Glucose Monitoring Suppl (ACCU-CHEK GUIDE) w/Device KIT 1 Act by Does not apply route 3 (three) times daily.  . Calcium Carb-Cholecalciferol (CALCIUM 600 + D PO) Take 1 tablet by mouth daily.  . carvedilol (COREG) 6.25 MG tablet TAKE 1 TABLET BY MOUTH TWICE DAILY WITH A MEAL  . citalopram (CELEXA) 20 MG tablet Take 1 tablet (20 mg total) by mouth daily.  . Exenatide ER (BYDUREON BCISE) 2 MG/0.85ML AUIJ Inject 1 Act into the skin once a week.  . gabapentin (NEURONTIN) 600 MG tablet TAKE ONE TABLET BY MOUTH THREE TIMES DAILY  . glucose blood (ACCU-CHEK GUIDE) test strip Use TID  . hydrochlorothiazide (HYDRODIURIL) 25 MG tablet TAKE 1 TABLET BY MOUTH ONCE DAILY....Marland KitchenPATIENT NEEDS APPOINTMENT FOR REFILLS  . Insulin Glargine (TOUJEO  SOLOSTAR) 300 UNIT/ML SOPN Inject 30 Units into the skin daily.  . Insulin Pen Needle (BD ULTRA-FINE MICRO PEN NEEDLE) 32G X 6 MM MISC Use to inject insulin as directed.  Marland Kitchen losartan (COZAAR) 100 MG tablet Take 1 tablet (100 mg total) by mouth daily.  . metFORMIN (GLUCOPHAGE) 500 MG tablet Take 1 tablet (500 mg total) by  mouth 2 (two) times daily with a meal.  . methotrexate 2.5 MG tablet   . Omega-3 Fatty Acids (FISH OIL) 1200 MG CAPS Take 1,200 mg by mouth daily.   . potassium chloride SA (K-DUR,KLOR-CON) 20 MEQ tablet Take 1 tablet (20 mEq total) by mouth 2 (two) times daily.  Marland Kitchen spironolactone (ALDACTONE) 25 MG tablet Take 1 tablet (25 mg total) by mouth daily.  . vitamin C (ASCORBIC ACID) 500 MG tablet Take 500 mg by mouth daily as needed (for cold symptoms).   Allergies  Allergen Reactions  . Lisinopril Cough   Past Medical History:  Diagnosis Date  . Anemia    mild  . Anxiety   . Arthritis   . Asthma   . Cardiomyopathy ([place])   . CHF (congestive heart failure) ([place])   . Diabetes mellitus without complication ([place])   . Frequency of urination    at night  . GERD (gastroesophageal reflux disease)    potassium worsens reflux  . Hemorrhoids, external   . Hyperlipidemia   . Hypertension   . Neuromuscular disorder (HCC)    neuropathy  . Numbness and tingling of both legs   . Rheumatoid arthritis ([place])   . Vertigo    hx of   Family History  Problem Relation Age of Onset  . Early death Father   . Heart disease Father   . Hypertension Sister   . Hypertension Brother   . Diabetes Brother   . Colon polyps Mother   . Hashimoto's thyroiditis Sister   . Colon cancer Maternal Grandmother        in her 59s  . Stomach cancer Maternal Aunt   . Non-Hodgkin's lymphoma Sister   . Alcohol abuse Neg Hx   . COPD Neg Hx   . Depression Neg Hx   . Drug abuse Neg Hx   . Hearing loss Neg Hx   . Hyperlipidemia Neg Hx   . Kidney disease Neg Hx   . Stroke Neg Hx   . Esophageal cancer Neg Hx   . Rectal cancer Neg Hx    Past Surgical History:  Procedure Laterality Date  . ABDOMINAL HYSTERECTOMY    . CARDIAC CATHETERIZATION N/A 10/28/2014   Procedure: Left Heart Cath and Coronary Angiography;  Surgeon: Jettie Booze, MD;  Location: [facility];  Service: Cardiovascular;  Laterality: N/A;    . CARPAL TUNNEL RELEASE Left   . COLONOSCOPY     pt states 11 yr ago in [place] had a colon with 2 polyps- one cecal polyp per pt. one ? location  . COLONOSCOPY W/ POLYPECTOMY    . CYST REMOVAL NECK    . HEMORROIDECTOMY    . MAXIMUM ACCESS (MAS)POSTERIOR LUMBAR INTERBODY FUSION (PLIF) 3 LEVEL N/A 10/08/2013   Procedure: FOR MAXIMUM ACCESS (MAS) POSTERIOR LUMBAR INTERBODY FUSION (PLIF) 3 LEVEL;  Surgeon: Erline Levine, MD;  Location: [facility];  Service: Neurosurgery;  Laterality: N/A;  L3-4 L4-5 L5-S1 maximum access posterior lumbar interbody fusion with decompression  . TUBAL LIGATION     Social History   Social History  . Marital status: Single  Spouse name: N/A  . Number of children: N/A  . Years of education: N/A   Occupational History  . Not on file.   Social History Main Topics  . Smoking status: Former Research scientist (life sciences)  . Smokeless tobacco: Never Used  . Alcohol use Yes     Comment: 3 x a year -rare   . Drug use: No  . Sexual activity: Not Currently   Other Topics Concern  . Not on file   Social History Narrative  . No narrative on file     Review of Systems: General: negative for chills, fever, night sweats or weight changes.  Cardiovascular: negative for chest pain, dyspnea on exertion, edema, orthopnea, palpitations, paroxysmal nocturnal dyspnea or shortness of breath Dermatological: negative for rash Respiratory: negative for cough or wheezing Urologic: negative for hematuria Abdominal: negative for nausea, vomiting, diarrhea, bright red blood per rectum, melena, or hematemesis Neurologic: negative for visual changes, syncope, or dizziness All other systems reviewed and are otherwise negative except as noted above.   Physical Exam:  Height 5' 5"  (1.651 m), weight 211 lb 6.4 oz (95.9 kg), last menstrual period 05/29/1991.  General appearance: alert, cooperative and no distress Neck: no carotid bruit and no JVD Lungs: clear to auscultation bilaterally Heart: regular  rate and rhythm, S1, S2 normal, no murmur, click, rub or gallop Extremities: extremities normal, atraumatic, no cyanosis or edema Pulses: 2+ and symmetric Skin: Skin color, texture, turgor normal. No rashes or lesions Neurologic: Grossly normal  EKG NSR -- personally reviewed   ASSESSMENT AND PLAN:   1. NICM: EF 35-40%. First diagnosed in 2016. LHC at that time with mild nonobstructive CAD. Continue BB, ARB and spironolactone. She is euvolemic on physical exam. No dyspnea. No edema. Continue current medication regimen. Low Sodium diet.  2. HTN: controlled on current regimen.   3. HLD: on statin therapy. Lipid profile is followed by her PCP. Recent LDL was 105 mg/dL  4. CAD: mild nonobstructive CAD noted on cath 10/2014. Stable w/o angina. Continue medical therapy for secondary prevention.   5. Diastolic Dysfunction: Grade 1 DD noted on echo 03/2016. Euvolemic on exam. BP is controlled.   6. DM: Followed and managaged by PCP.   7. Rheumatoid Arthritis: managed by PCP.    8. Renal insufficiency: recent basic metabolic panel [id number]/0354 showed slight bump in serum creatinine up to 1.29. She had follow-up labs July 20th at her rheumatoid pathology office. We requested these results to be faxed to our office for review.   Follow-Up w/ Dr. Meda Coffee in 6 months.   Nayellie Sanseverino Ladoris Gene, MHS [facility] 01/16/2017 8:20 AM

## 2017-02-06 ENCOUNTER — Other Ambulatory Visit: Payer: Self-pay | Admitting: Internal Medicine

## 2017-02-19 ENCOUNTER — Encounter: Payer: Self-pay | Admitting: Internal Medicine

## 2017-02-19 ENCOUNTER — Ambulatory Visit (INDEPENDENT_AMBULATORY_CARE_PROVIDER_SITE_OTHER): Payer: Medicare Other | Admitting: Internal Medicine

## 2017-02-19 VITALS — BP 118/70 | HR 68 | Temp 98.2°F | Resp 16 | Ht 65.0 in | Wt 207.0 lb

## 2017-02-19 DIAGNOSIS — G43A0 Cyclical vomiting, not intractable: Secondary | ICD-10-CM

## 2017-02-19 DIAGNOSIS — R1011 Right upper quadrant pain: Secondary | ICD-10-CM

## 2017-02-19 DIAGNOSIS — R1115 Cyclical vomiting syndrome unrelated to migraine: Secondary | ICD-10-CM | POA: Insufficient documentation

## 2017-02-19 DIAGNOSIS — K219 Gastro-esophageal reflux disease without esophagitis: Secondary | ICD-10-CM | POA: Insufficient documentation

## 2017-02-19 DIAGNOSIS — Z23 Encounter for immunization: Secondary | ICD-10-CM

## 2017-02-19 DIAGNOSIS — E118 Type 2 diabetes mellitus with unspecified complications: Secondary | ICD-10-CM | POA: Diagnosis not present

## 2017-02-19 DIAGNOSIS — K3184 Gastroparesis: Secondary | ICD-10-CM

## 2017-02-19 DIAGNOSIS — R110 Nausea: Secondary | ICD-10-CM | POA: Diagnosis not present

## 2017-02-19 DIAGNOSIS — E1143 Type 2 diabetes mellitus with diabetic autonomic (poly)neuropathy: Secondary | ICD-10-CM | POA: Insufficient documentation

## 2017-02-19 LAB — POCT GLYCOSYLATED HEMOGLOBIN (HGB A1C): HEMOGLOBIN A1C: 5.8

## 2017-02-19 MED ORDER — DEXLANSOPRAZOLE 60 MG PO CPDR: 60.0000 mg | DELAYED_RELEASE_CAPSULE | Freq: Every day | ORAL | 1 refills | Status: DC

## 2017-02-19 NOTE — Progress Notes (Signed)
Subjective:  Patient ID: Emily Wheeler, female    DOB: 03/21/1956  Age: 61 y.o. MRN: [id number]  CC: Abdominal Pain and Gastroesophageal Reflux   HPI Emily Wheeler presents for f/up - She complains of a several month history of worsening abdominal pain that she describes as intermittent sharp pain in her right upper quadrant. She says there is no correlation to meals. She also complains of heartburn and occasionally takes at [facility] and gets some symptom relief. She has mild nausea but no vomiting, over the last few days she has had loss of appetite and has lost a few pounds. She complains of belching but denies odynophagia or dysphagia. She has not noticed any melena or blood in her stools.  Outpatient Medications Prior to Visit  Medication Sig Dispense Refill  . albuterol (PROVENTIL HFA;VENTOLIN HFA) 108 (90 Base) MCG/ACT inhaler Inhale 1-2 puffs into the lungs every 6 (six) hours as needed for wheezing or shortness of breath. 18 g 11  . allopurinol (ZYLOPRIM) 100 MG tablet TAKE 2 TABLETS BY MOUTH ONCE DAILY 180 tablet 0  . aspirin EC 81 MG tablet Take 1 tablet (81 mg total) by mouth daily. 90 tablet 3  . atorvastatin (LIPITOR) 40 MG tablet TAKE ONE TABLET BY MOUTH ONCE DAILY 90 tablet 3  . Blood Glucose Monitoring Suppl (ACCU-CHEK GUIDE) w/Device KIT 1 Act by Does not apply route 3 (three) times daily. 2 kit 0  . Calcium Carb-Cholecalciferol (CALCIUM 600 + D PO) Take 1 tablet by mouth daily.    . carvedilol (COREG) 6.25 MG tablet TAKE 1 TABLET BY MOUTH TWICE DAILY WITH A MEAL 180 tablet 0  . citalopram (CELEXA) 20 MG tablet TAKE ONE TABLET BY MOUTH ONCE DAILY 90 tablet 1  . folic acid (FOLVITE) 1 MG tablet TAKE TWO (2) TABLETS (2 MG TOTAL)  BY MOUTH DAILY    . gabapentin (NEURONTIN) 600 MG tablet TAKE ONE TABLET BY MOUTH THREE TIMES DAILY 270 tablet 3  . glucose blood (ACCU-CHEK GUIDE) test strip Use TID 100 each 12  . hydrochlorothiazide (HYDRODIURIL) 25 MG tablet TAKE 1 TABLET BY  MOUTH ONCE DAILY....Marland KitchenPATIENT NEEDS APPOINTMENT FOR REFILLS 90 tablet 1  . Insulin Pen Needle (BD ULTRA-FINE MICRO PEN NEEDLE) 32G X 6 MM MISC Use to inject insulin as directed. 200 each 3  . losartan (COZAAR) 100 MG tablet Take 1 tablet (100 mg total) by mouth daily. 90 tablet 3  . metFORMIN (GLUCOPHAGE) 500 MG tablet Take 1 tablet (500 mg total) by mouth 2 (two) times daily with a meal. 180 tablet 1  . methotrexate 2.5 MG tablet     . Omega-3 Fatty Acids (FISH OIL) 1200 MG CAPS Take 1,200 mg by mouth daily.     . potassium chloride SA (K-DUR,KLOR-CON) 20 MEQ tablet Take 1 tablet (20 mEq total) by mouth 2 (two) times daily. 60 tablet 5  . vitamin C (ASCORBIC ACID) 500 MG tablet Take 500 mg by mouth daily as needed (for cold symptoms).    . Exenatide ER (BYDUREON BCISE) 2 MG/0.85ML AUIJ Inject 1 Act into the skin once a week. 4 pen 11  . Insulin Glargine (TOUJEO SOLOSTAR) 300 UNIT/ML SOPN Inject 30 Units into the skin daily. 3 mL 5  . spironolactone (ALDACTONE) 25 MG tablet Take 1 tablet (25 mg total) by mouth daily. 90 tablet 3   No facility-administered medications prior to visit.     ROS Review of Systems  Constitutional: Positive for appetite change and unexpected  weight change. Negative for activity change, chills, diaphoresis, fatigue and fever.  HENT: Negative.  Negative for trouble swallowing.   Respiratory: Negative.  Negative for cough, shortness of breath and wheezing.   Cardiovascular: Negative for chest pain, palpitations and leg swelling.  Gastrointestinal: Positive for abdominal pain, constipation and nausea. Negative for abdominal distention, blood in stool, diarrhea and vomiting.  Endocrine: Negative.   Genitourinary: Negative.  Negative for decreased urine volume, difficulty urinating, dysuria, flank pain, frequency, hematuria and urgency.  Musculoskeletal: Negative.   Skin: Negative.   Allergic/Immunologic: Negative.   Neurological: Negative.  Negative for dizziness.    Hematological: Negative for adenopathy. Does not bruise/bleed easily.  Psychiatric/Behavioral: Negative.     Objective:  BP 118/70 (BP Location: Left Arm, Patient Position: Sitting, Cuff Size: Large)   Pulse 68   Temp 98.2 F (36.8 C) (Oral)   Resp 16   Ht 5' 5"  (1.651 m)   Wt 207 lb (93.9 kg)   LMP 05/29/1991   SpO2 99%   BMI 34.45 kg/m   BP Readings from Last 3 Encounters:  02/19/17 118/70  01/16/17 116/60  10/31/16 120/80    Wt Readings from Last 3 Encounters:  02/19/17 207 lb (93.9 kg)  01/16/17 211 lb 6.4 oz (95.9 kg)  10/31/16 206 lb (93.4 kg)    Physical Exam  Constitutional: She is oriented to person, place, and time. She does not have a sickly appearance. She does not appear ill. No distress.  HENT:  Mouth/Throat: Oropharynx is clear and moist. No oropharyngeal exudate.  Eyes: Conjunctivae are normal. Right eye exhibits no discharge. Left eye exhibits no discharge. No scleral icterus.  Neck: Normal range of motion. Neck supple. No JVD present. No tracheal deviation present. No thyromegaly present.  Cardiovascular: Normal rate, regular rhythm and intact distal pulses.  Exam reveals no gallop and no friction rub.   No murmur heard. Pulmonary/Chest: Effort normal and breath sounds normal. No respiratory distress. She has no wheezes. She has no rales. She exhibits no tenderness.  Abdominal: Soft. Normal appearance and bowel sounds are normal. She exhibits no distension and no mass. There is no hepatosplenomegaly or hepatomegaly. There is tenderness in the right upper quadrant. There is no rebound, no guarding and no CVA tenderness. No hernia. Hernia confirmed negative in the ventral area.  Musculoskeletal: Normal range of motion. She exhibits no edema, tenderness or deformity.  Lymphadenopathy:    She has no cervical adenopathy.  Neurological: She is alert and oriented to person, place, and time.  Skin: Skin is warm and dry. No rash noted. She is not diaphoretic. No  erythema. No pallor.  Vitals reviewed.   Lab Results  Component Value Date   WBC 10.2 10/31/2016   HGB 13.0 10/31/2016   HCT 38.4 10/31/2016   PLT 288.0 10/31/2016   GLUCOSE 311 (H) 10/31/2016   CHOL 194 10/31/2016   TRIG 190.0 (H) 10/31/2016   HDL 51.10 10/31/2016   LDLDIRECT 162.5 01/30/2013   LDLCALC 105 (H) 10/31/2016   ALT 16 10/31/2016   AST 10 10/31/2016   NA 133 (L) 10/31/2016   K 4.6 10/31/2016   CL 98 10/31/2016   CREATININE 1.26 (H) 10/31/2016   BUN 30 (H) 10/31/2016   CO2 23 10/31/2016   TSH 1.76 10/31/2016   INR 0.9 10/26/2014   HGBA1C 5.8 02/19/2017   MICROALBUR 1.0 03/29/2016    Mm Screening Breast Tomo Bilateral  Result Date: 12/05/2016 CLINICAL DATA:  Screening. EXAM: 2D DIGITAL SCREENING BILATERAL MAMMOGRAM  WITH CAD AND ADJUNCT TOMO COMPARISON:  Previous exam(s). ACR Breast Density Category b: There are scattered areas of fibroglandular density. FINDINGS: There are no findings suspicious for malignancy. Images were processed with CAD. IMPRESSION: No mammographic evidence of malignancy. A result letter of this screening mammogram will be mailed directly to the patient. RECOMMENDATION: Screening mammogram in one year. (Code:SM-B-01Y) BI-RADS CATEGORY  1: Negative. Electronically Signed   By: Abelardo Diesel M.D.   On: 12/05/2016 10:59    Assessment & Plan:   Emily Wheeler was seen today for abdominal pain and gastroesophageal reflux.  Diagnoses and all orders for this visit:  Nausea without vomiting- the GLP-1 agonist may be contributing to her symptoms have asked her to stop taking it. Will get a nuclear medicine study to see if she has gastroparesis or gastric outlet syndrome, will also get an ultrasound to see if there is concern for gallstones or fatty liver disease. -     NM Gastric Emptying; Future -     US Abdomen Complete; Future  Need for influenza vaccination -     Flu Vaccine QUAD 57+ mos IM  Cyclical vomiting with nausea, intractability of vomiting  not specified -     NM Gastric Emptying; Future  GERD without esophagitis- will start a PPI for symptom relief. -     dexlansoprazole (DEXILANT) 60 MG capsule; Take 1 capsule (60 mg total) by mouth daily.  [place] RUQ abdominal pain- as above -     US Abdomen Complete; Future  Type 2 diabetes mellitus with complication, without long-term current use of insulin ([place])-  her A1c is down to 5.8%. Will stop by [facility] and a basal insulin. -     POCT glycosylated hemoglobin (Hb A1C)   I have discontinued Ms. Hight's Exenatide ER and Insulin Glargine. I am also having her start on dexlansoprazole. Additionally, I am having her maintain her Fish Oil, aspirin EC, Calcium Carb-Cholecalciferol (CALCIUM 600 + D PO), vitamin C, potassium chloride SA, albuterol, spironolactone, gabapentin, atorvastatin, methotrexate, ACCU-CHEK GUIDE, glucose blood, metFORMIN, Insulin Pen Needle, hydrochlorothiazide, carvedilol, allopurinol, folic acid, losartan, and citalopram.  Meds ordered this encounter  Medications  . dexlansoprazole (DEXILANT) 60 MG capsule    Sig: Take 1 capsule (60 mg total) by mouth daily.    Dispense:  90 capsule    Refill:  1     Follow-up: Return in about 4 weeks (around 03/19/2017).  Scarlette Calico, MD

## 2017-02-19 NOTE — Patient Instructions (Signed)

## 2017-03-07 ENCOUNTER — Encounter (HOSPITAL_COMMUNITY): Payer: Self-pay

## 2017-03-07 ENCOUNTER — Ambulatory Visit (HOSPITAL_COMMUNITY)
Admission: RE | Admit: 2017-03-07 | Discharge: 2017-03-07 | Disposition: A | Discharge status: Home / Self Care | Payer: Medicare Other | Source: Ambulatory Visit | Attending: Internal Medicine | Admitting: Internal Medicine

## 2017-03-07 ENCOUNTER — Encounter (HOSPITAL_COMMUNITY)
Admission: RE | Admit: 2017-03-07 | Discharge: 2017-03-07 | Disposition: A | Discharge status: Home / Self Care | Payer: Medicare Other | Source: Ambulatory Visit | Attending: Internal Medicine | Admitting: Internal Medicine

## 2017-03-07 ENCOUNTER — Encounter: Payer: Self-pay | Admitting: Internal Medicine

## 2017-03-07 DIAGNOSIS — R110 Nausea: Secondary | ICD-10-CM | POA: Insufficient documentation

## 2017-03-07 DIAGNOSIS — G43A0 Cyclical vomiting, not intractable: Secondary | ICD-10-CM

## 2017-03-07 DIAGNOSIS — R1011 Right upper quadrant pain: Secondary | ICD-10-CM | POA: Insufficient documentation

## 2017-03-11 ENCOUNTER — Other Ambulatory Visit: Payer: Self-pay | Admitting: Internal Medicine

## 2017-03-11 ENCOUNTER — Encounter: Payer: Self-pay | Admitting: Internal Medicine

## 2017-03-11 ENCOUNTER — Ambulatory Visit (HOSPITAL_COMMUNITY)
Admission: RE | Admit: 2017-03-11 | Discharge: 2017-03-11 | Disposition: A | Discharge status: Home / Self Care | Payer: Medicare Other | Source: Ambulatory Visit | Attending: Internal Medicine | Admitting: Internal Medicine

## 2017-03-11 DIAGNOSIS — G43A0 Cyclical vomiting, not intractable: Secondary | ICD-10-CM | POA: Diagnosis not present

## 2017-03-11 DIAGNOSIS — R112 Nausea with vomiting, unspecified: Secondary | ICD-10-CM | POA: Diagnosis not present

## 2017-03-11 DIAGNOSIS — E1143 Type 2 diabetes mellitus with diabetic autonomic (poly)neuropathy: Secondary | ICD-10-CM

## 2017-03-11 DIAGNOSIS — K30 Functional dyspepsia: Secondary | ICD-10-CM | POA: Diagnosis not present

## 2017-03-11 DIAGNOSIS — K3184 Gastroparesis: Principal | ICD-10-CM

## 2017-03-11 MED ORDER — TECHNETIUM TC 99M SULFUR COLLOID: 2.0000 | Freq: Once | INTRAVENOUS | Status: DC | PRN

## 2017-03-12 ENCOUNTER — Telehealth: Payer: Self-pay | Admitting: Internal Medicine

## 2017-03-14 MED ORDER — ALBUTEROL SULFATE HFA 108 (90 BASE) MCG/ACT IN AERS: INHALATION_SPRAY | RESPIRATORY_TRACT | 11 refills | Status: DC

## 2017-03-14 NOTE — Telephone Encounter (Signed)
Pt just called [facility] and they told her that did not rec this?  Can this be resent?

## 2017-03-14 NOTE — Telephone Encounter (Signed)
Date/Time Action Taken User Additional Information  03/12/17 1452 Sign [name], MD ReorderfromOrder:[id number]  03/14/17 1554 Reorder Deatra James, MA [phone]  Medication Detail Per received confirmation from [facility] received on 10/16 @2 :52 pm... Will resend again   Disp Refills Start End   VENTOLIN HFA 108 (90 Base) MCG/ACT inhaler 18 each 11 03/12/2017    Sig: INHALE 1 TO 2 PUFFS INTO THE LUNGS EVERY 6 HOURS AS NEEDED FOR WHEEZING OR SHORTNESS OF BREATH   Sent to pharmacy as: VENTOLIN HFA 108 (90 Base) MCG/ACT inhaler   Notes to Pharmacy: Please consider 90 day supplies to promote better adherence   E-Prescribing Status: Receipt confirmed by pharmacy (03/12/2017 2:52 PM EDT)

## 2017-03-14 NOTE — Addendum Note (Signed)
Addended by: Deatra James on: 03/14/2017 03:57 PM   Modules accepted: Orders

## 2017-03-14 NOTE — Addendum Note (Signed)
Addended by: Deatra James on: 03/14/2017 03:54 PM   Modules accepted: Orders

## 2017-03-19 ENCOUNTER — Other Ambulatory Visit: Payer: Self-pay | Admitting: Internal Medicine

## 2017-03-19 MED ORDER — ALBUTEROL SULFATE 108 (90 BASE) MCG/ACT IN AEPB: 2.0000 | INHALATION_SPRAY | Freq: Four times a day (QID) | RESPIRATORY_TRACT | 11 refills | Status: DC | PRN

## 2017-03-28 ENCOUNTER — Other Ambulatory Visit: Payer: Self-pay | Admitting: Internal Medicine

## 2017-03-28 DIAGNOSIS — I10 Essential (primary) hypertension: Secondary | ICD-10-CM

## 2017-03-29 ENCOUNTER — Other Ambulatory Visit: Payer: Self-pay | Admitting: Cardiology

## 2017-05-03 ENCOUNTER — Other Ambulatory Visit: Payer: Self-pay | Admitting: Internal Medicine

## 2017-05-03 DIAGNOSIS — M48061 Spinal stenosis, lumbar region without neurogenic claudication: Secondary | ICD-10-CM

## 2017-05-16 ENCOUNTER — Other Ambulatory Visit: Payer: Self-pay | Admitting: Internal Medicine

## 2017-05-17 ENCOUNTER — Ambulatory Visit (INDEPENDENT_AMBULATORY_CARE_PROVIDER_SITE_OTHER): Payer: Medicare Other | Admitting: Gastroenterology

## 2017-05-17 ENCOUNTER — Encounter: Payer: Self-pay | Admitting: Gastroenterology

## 2017-05-17 VITALS — BP 126/82 | HR 74 | Ht 65.0 in | Wt 205.0 lb

## 2017-05-17 DIAGNOSIS — R109 Unspecified abdominal pain: Secondary | ICD-10-CM | POA: Diagnosis not present

## 2017-05-17 DIAGNOSIS — E1143 Type 2 diabetes mellitus with diabetic autonomic (poly)neuropathy: Secondary | ICD-10-CM

## 2017-05-17 DIAGNOSIS — K3184 Gastroparesis: Secondary | ICD-10-CM

## 2017-05-17 DIAGNOSIS — R112 Nausea with vomiting, unspecified: Secondary | ICD-10-CM

## 2017-05-17 MED ORDER — METOCLOPRAMIDE HCL 5 MG PO TABS: 5.0000 mg | ORAL_TABLET | Freq: Every day | ORAL | 5 refills | Status: DC

## 2017-05-17 NOTE — Progress Notes (Signed)
Review of pertinent gastrointestinal problems: 1.  Routine risk for colon cancer.  Colonoscopy 09/2015 Dr. Ardis Hughs found no polyps.  Recommended recall at 10-year interval   HPI: This is a  very pleasant 61 year old woman who was referred to me by [name], MD  to evaluate abdominal pains, nausea, gastroparesis.    Chief complaint is intermittent abdominal pains, nausea, gastroparesis   For the past 3 months or so she has had intermittent abdominal pains associated with nausea.  This happens 2 or 3 times a day.  She does not have any vomiting.  She also has been feeling full early when she eats.  She will often wake up in the morning and have dyspepsia, belching of food from the night before.  Was put on dexlinant and that helps very slightly.  Normally eats twice per day. Very small early meal (toast) then 7pm larger meal and no other meals.   Lost 10 pounds in past few weeks since she weaned herself off of prednisone and has been on insulin for the past, several weeks.   Old Data Reviewed:  Gastric emptying scan October 2018 showed significantly slow gastric emptying 16% at 4 hours while normal is greater than or equal to 90%  Hemoglobin A1c September 2018 was 5.8, June 2008 was 10.8   abdominal ultrasound October 2018 was essentially normal.      Review of systems: Pertinent positive and negative review of systems were noted in the above HPI section. All other review negative.   Past Medical History:  Diagnosis Date  . Anemia    mild  . Anxiety   . Arthritis   . Asthma   . Cardiomyopathy ([place])   . CHF (congestive heart failure) ([place])   . Diabetes mellitus without complication ([place])   . Frequency of urination    at night  . GERD (gastroesophageal reflux disease)    potassium worsens reflux  . Hemorrhoids, external   . Hyperlipidemia   . Hypertension   . Neuromuscular disorder ([facility])    neuropathy  . Numbness and tingling of both legs   . Rheumatoid  arthritis ([place])   . Vertigo    hx of    Past Surgical History:  Procedure Laterality Date  . ABDOMINAL HYSTERECTOMY    . CARDIAC CATHETERIZATION N/A 10/28/2014   Procedure: Left Heart Cath and Coronary Angiography;  Surgeon: Jettie Booze, MD;  Location: [facility];  Service: Cardiovascular;  Laterality: N/A;  . CARPAL TUNNEL RELEASE Left   . COLONOSCOPY     pt states 11 yr ago in [place] had a colon with 2 polyps- one cecal polyp per pt. one ? location  . COLONOSCOPY W/ POLYPECTOMY    . CYST REMOVAL NECK    . HEMORROIDECTOMY    . MAXIMUM ACCESS (MAS)POSTERIOR LUMBAR INTERBODY FUSION (PLIF) 3 LEVEL N/A 10/08/2013   Procedure: FOR MAXIMUM ACCESS (MAS) POSTERIOR LUMBAR INTERBODY FUSION (PLIF) 3 LEVEL;  Surgeon: Erline Levine, MD;  Location: [facility];  Service: Neurosurgery;  Laterality: N/A;  L3-4 L4-5 L5-S1 maximum access posterior lumbar interbody fusion with decompression  . TUBAL LIGATION      Current Outpatient Medications  Medication Sig Dispense Refill  . Albuterol Sulfate (PROAIR RESPICLICK) 161 (90 Base) MCG/ACT AEPB Inhale 2 puffs into the lungs 4 (four) times daily as needed. 1 each 11  . allopurinol (ZYLOPRIM) 100 MG tablet TAKE 2 TABLETS BY MOUTH ONCE DAILY 180 tablet 0  . aspirin EC 81 MG tablet  Take 1 tablet (81 mg total) by mouth daily. 90 tablet 3  . atorvastatin (LIPITOR) 40 MG tablet TAKE ONE TABLET BY MOUTH ONCE DAILY 90 tablet 3  . Blood Glucose Monitoring Suppl (ACCU-CHEK GUIDE) w/Device KIT 1 Act by Does not apply route 3 (three) times daily. 2 kit 0  . Calcium Carb-Cholecalciferol (CALCIUM 600 + D PO) Take 1 tablet by mouth daily.    . carvedilol (COREG) 6.25 MG tablet TAKE 1 TABLET BY MOUTH TWICE DAILY WITH A MEAL 180 tablet 0  . citalopram (CELEXA) 20 MG tablet TAKE ONE TABLET BY MOUTH ONCE DAILY 90 tablet 1  . dexlansoprazole (DEXILANT) 60 MG capsule Take 1 capsule (60 mg total) by mouth daily. 90 capsule 1  . folic acid (FOLVITE) 1 MG tablet TAKE TWO  (2) TABLETS (2 MG TOTAL)  BY MOUTH DAILY    . gabapentin (NEURONTIN) 600 MG tablet Take 1 tablet (600 mg total) by mouth 3 (three) times daily. 270 tablet 1  . glucose blood (ACCU-CHEK GUIDE) test strip Use TID 100 each 12  . losartan (COZAAR) 100 MG tablet Take 1 tablet (100 mg total) by mouth daily. 90 tablet 3  . metFORMIN (GLUCOPHAGE) 500 MG tablet Take 1 tablet (500 mg total) by mouth 2 (two) times daily with a meal. 180 tablet 1  . methotrexate 2.5 MG tablet     . Omega-3 Fatty Acids (FISH OIL) 1200 MG CAPS Take 1,200 mg by mouth daily.     . potassium chloride SA (K-DUR,KLOR-CON) 20 MEQ tablet Take 1 tablet (20 mEq total) by mouth 2 (two) times daily. 60 tablet 5  . spironolactone (ALDACTONE) 25 MG tablet TAKE ONE TABLET BY MOUTH ONCE DAILY 90 tablet 2  . vitamin C (ASCORBIC ACID) 500 MG tablet Take 500 mg by mouth daily as needed (for cold symptoms).    . hydrochlorothiazide (HYDRODIURIL) 25 MG tablet Take 1 tablet (25 mg total) by mouth daily. 90 tablet 1   No current facility-administered medications for this visit.     Allergies as of 05/17/2017 - Review Complete 05/17/2017  Allergen Reaction Noted  . Lisinopril Cough 07/08/2014    Family History  Problem Relation Age of Onset  . Early death Father   . Heart disease Father   . Hypertension Sister   . Hypertension Brother   . Diabetes Brother   . Colon polyps Mother   . Hashimoto's thyroiditis Sister   . Colon cancer Maternal Grandmother        in her 84s  . Stomach cancer Maternal Aunt   . Non-Hodgkin's lymphoma Sister   . Alcohol abuse Neg Hx   . COPD Neg Hx   . Depression Neg Hx   . Drug abuse Neg Hx   . Hearing loss Neg Hx   . Hyperlipidemia Neg Hx   . Kidney disease Neg Hx   . Stroke Neg Hx   . Esophageal cancer Neg Hx   . Rectal cancer Neg Hx     Social History   Socioeconomic History  . Marital status: Single    Spouse name: Not on file  . Number of children: Not on file  . Years of education: Not  on file  . Highest education level: Not on file  Social Needs  . Financial resource strain: Not on file  . Food insecurity - worry: Not on file  . Food insecurity - inability: Not on file  . Transportation needs - medical: Not on file  . Transportation  needs - non-medical: Not on file  Occupational History  . Not on file  Tobacco Use  . Smoking status: Former Research scientist (life sciences)  . Smokeless tobacco: Never Used  Substance and Sexual Activity  . Alcohol use: Yes    Comment: 3 x a year -rare   . Drug use: No  . Sexual activity: Not Currently  Other Topics Concern  . Not on file  Social History Narrative  . Not on file     Physical Exam: BP 126/82   Pulse 74   Ht 5' 5"  (1.651 m)   Wt 205 lb (93 kg)   LMP 05/29/1991   BMI 34.11 kg/m  Constitutional: generally well-appearing Psychiatric: alert and oriented x3 Eyes: extraocular movements intact Mouth: oral pharynx moist, no lesions Neck: supple no lymphadenopathy Cardiovascular: heart regular rate and rhythm Lungs: clear to auscultation bilaterally Abdomen: soft, nontender, nondistended, no obvious ascites, no peritoneal signs, normal bowel sounds Extremities: no lower extremity edema bilaterally Skin: no lesions on visible extremities   Assessment and plan: 61 y.o. female with gastroparesis, nausea, abdominal pain, early satiety  I suspect diabetic gastroparesis is causing majority of these symptoms.  We discussed this in detail and she understands that she should be eating 4 or 5 smaller meals a day rather than one small meal +1 very large meal as has been her pattern for many years.  She will try to make that change.  I am also going to call in Reglan 5 mg pills which she will take 1 pill at bedtime every night.  We discussed the rare but irreversible side effect of tar dive dyskinesia.  I explained to her that I think it is very unlikely that will happen to her at her age and with the doses we are talking about putting her on.   Lastly I recommended an upper endoscopy to exclude other potential causes such as gastritis, H. pylori, peptic ulcer disease.    Please see the "Patient Instructions" section for addition details about the plan.   Owens Loffler, MD [facility] 05/17/2017, 11:02 AM  Cc: [name], MD

## 2017-05-17 NOTE — Patient Instructions (Addendum)
If you are age 63 or older, your body mass index should be between 23-30. Your Body mass index is 34.11 kg/m. If this is out of the aforementioned range listed, please consider follow up with your Primary Care Provider.  If you are age 42 or younger, your body mass index should be between 19-25. Your Body mass index is 34.11 kg/m. If this is out of the aformentioned range listed, please consider follow up with your Primary Care Provider.   You will be set up for an upper endoscopy for abdominal pain, nausea.  You have been scheduled for an endoscopy. Please follow written instructions given to you at your visit today. If you use inhalers (even only as needed), please bring them with you on the day of your procedure. Your physician has requested that you go to www.startemmi.com and enter the access code given to you at your visit today. This web site gives a general overview about your procedure. However, you should still follow specific instructions given to you by our office regarding your preparation for the procedure.  We have sent the following medications to your pharmacy for you to pick up at your convenience: Reglan 5mg  pills, one pill at bedtime nightly.   You need to eat 4-5 small meals per day rather one to two large meals per day.  Thank you for choosing me and [facility].    , MD

## 2017-05-31 ENCOUNTER — Other Ambulatory Visit: Payer: Self-pay | Admitting: Internal Medicine

## 2017-05-31 DIAGNOSIS — E118 Type 2 diabetes mellitus with unspecified complications: Secondary | ICD-10-CM

## 2017-06-10 ENCOUNTER — Encounter: Payer: Self-pay | Admitting: Gastroenterology

## 2017-06-21 ENCOUNTER — Encounter: Payer: Medicare Other | Admitting: Gastroenterology

## 2017-06-25 ENCOUNTER — Other Ambulatory Visit: Payer: Self-pay | Admitting: Internal Medicine

## 2017-06-25 DIAGNOSIS — M069 Rheumatoid arthritis, unspecified: Secondary | ICD-10-CM

## 2017-07-09 ENCOUNTER — Other Ambulatory Visit: Payer: Self-pay | Admitting: Internal Medicine

## 2017-07-09 DIAGNOSIS — I10 Essential (primary) hypertension: Secondary | ICD-10-CM

## 2017-07-15 ENCOUNTER — Other Ambulatory Visit: Payer: Self-pay | Admitting: Family Medicine

## 2017-07-15 NOTE — Telephone Encounter (Signed)
Refill denied. Pt has not been seen in over a year.  

## 2017-07-31 ENCOUNTER — Encounter: Payer: Self-pay | Admitting: Internal Medicine

## 2017-07-31 MED ORDER — ALLOPURINOL 100 MG PO TABS: 200.0000 mg | ORAL_TABLET | Freq: Every day | ORAL | 0 refills | Status: DC

## 2017-08-01 ENCOUNTER — Other Ambulatory Visit: Payer: Self-pay | Admitting: Internal Medicine

## 2017-08-01 DIAGNOSIS — E785 Hyperlipidemia, unspecified: Secondary | ICD-10-CM

## 2017-08-01 DIAGNOSIS — E118 Type 2 diabetes mellitus with unspecified complications: Secondary | ICD-10-CM

## 2017-08-07 ENCOUNTER — Telehealth: Payer: Self-pay | Admitting: Internal Medicine

## 2017-08-07 NOTE — Telephone Encounter (Signed)
Left message on voicemail for the pt to contact the pharmacy for refills. Refills on requested medications were sent on 07/31/17.

## 2017-08-07 NOTE — Telephone Encounter (Signed)
Copied from CRM [id number]. Topic: Quick Communication - Rx Refill/Question >> Aug 07, 2017  1:27 PM Raquel Sarna wrote: allopurinol (ZYLOPRIM) 100 MG tablet atorvastatin (LIPITOR) 40 MG tablet  Pt has Rx refill visit w/ Dr. Yetta Barre on 3-19 Pt is out of medications   [facility] [address] [place], [place] - [address] [place] [address] Phone: [phone] Fax: [phone]

## 2017-08-13 ENCOUNTER — Ambulatory Visit (INDEPENDENT_AMBULATORY_CARE_PROVIDER_SITE_OTHER): Payer: Medicare Other | Admitting: Internal Medicine

## 2017-08-13 ENCOUNTER — Encounter: Payer: Self-pay | Admitting: Internal Medicine

## 2017-08-13 ENCOUNTER — Other Ambulatory Visit (INDEPENDENT_AMBULATORY_CARE_PROVIDER_SITE_OTHER): Payer: Medicare Other

## 2017-08-13 VITALS — BP 128/62 | HR 77 | Temp 98.7°F | Ht 65.0 in | Wt 204.0 lb

## 2017-08-13 DIAGNOSIS — Z0000 Encounter for general adult medical examination without abnormal findings: Secondary | ICD-10-CM

## 2017-08-13 DIAGNOSIS — B351 Tinea unguium: Secondary | ICD-10-CM

## 2017-08-13 DIAGNOSIS — D539 Nutritional anemia, unspecified: Secondary | ICD-10-CM | POA: Diagnosis not present

## 2017-08-13 DIAGNOSIS — I10 Essential (primary) hypertension: Secondary | ICD-10-CM | POA: Diagnosis not present

## 2017-08-13 DIAGNOSIS — I739 Peripheral vascular disease, unspecified: Secondary | ICD-10-CM

## 2017-08-13 DIAGNOSIS — D519 Vitamin B12 deficiency anemia, unspecified: Secondary | ICD-10-CM | POA: Insufficient documentation

## 2017-08-13 DIAGNOSIS — E118 Type 2 diabetes mellitus with unspecified complications: Secondary | ICD-10-CM

## 2017-08-13 DIAGNOSIS — Z23 Encounter for immunization: Secondary | ICD-10-CM | POA: Diagnosis not present

## 2017-08-13 DIAGNOSIS — E785 Hyperlipidemia, unspecified: Secondary | ICD-10-CM

## 2017-08-13 LAB — MICROALBUMIN / CREATININE URINE RATIO
CREATININE, U: 96.8 mg/dL
Microalb Creat Ratio: 0.7 mg/g (ref 0.0–30.0)

## 2017-08-13 LAB — CBC WITH DIFFERENTIAL/PLATELET
BASOS ABS: 0 10*3/uL (ref 0.0–0.1)
Basophils Relative: 0.8 % (ref 0.0–3.0)
EOS ABS: 0.2 10*3/uL (ref 0.0–0.7)
Eosinophils Relative: 5.7 % — ABNORMAL HIGH (ref 0.0–5.0)
HEMATOCRIT: 32.8 % — AB (ref 36.0–46.0)
Hemoglobin: 10.7 g/dL — ABNORMAL LOW (ref 12.0–15.0)
Lymphocytes Relative: 50.7 % — ABNORMAL HIGH (ref 12.0–46.0)
Lymphs Abs: 2.2 10*3/uL (ref 0.7–4.0)
MCHC: 32.7 g/dL (ref 30.0–36.0)
MCV: 94.3 fl (ref 78.0–100.0)
Monocytes Absolute: 0.6 10*3/uL (ref 0.1–1.0)
Monocytes Relative: 13.1 % — ABNORMAL HIGH (ref 3.0–12.0)
NEUTROS ABS: 1.3 10*3/uL — AB (ref 1.4–7.7)
NEUTROS PCT: 29.7 % — AB (ref 43.0–77.0)
PLATELETS: 271 10*3/uL (ref 150.0–400.0)
RBC: 3.48 Mil/uL — ABNORMAL LOW (ref 3.87–5.11)
RDW: 14.8 % (ref 11.5–15.5)
WBC: 4.4 10*3/uL (ref 4.0–10.5)

## 2017-08-13 LAB — COMPREHENSIVE METABOLIC PANEL
ALT: 14 U/L (ref 0–35)
AST: 14 U/L (ref 0–37)
Albumin: 4.3 g/dL (ref 3.5–5.2)
Alkaline Phosphatase: 73 U/L (ref 39–117)
BILIRUBIN TOTAL: 0.5 mg/dL (ref 0.2–1.2)
BUN: 19 mg/dL (ref 6–23)
CALCIUM: 9.5 mg/dL (ref 8.4–10.5)
CO2: 27 meq/L (ref 19–32)
CREATININE: 1.1 mg/dL (ref 0.40–1.20)
Chloride: 103 mEq/L (ref 96–112)
GFR: 64.42 mL/min (ref >60.00)
GLUCOSE: 112 mg/dL — AB (ref 70–99)
Potassium: 4 mEq/L (ref 3.5–5.1)
Sodium: 137 mEq/L (ref 135–145)
Total Protein: 6.9 g/dL (ref 6.0–8.3)

## 2017-08-13 LAB — LIPID PANEL
CHOL/HDL RATIO: 3
Cholesterol: 134 mg/dL (ref 0–200)
HDL: 43.1 mg/dL (ref >39.00)
LDL Cholesterol: 67 mg/dL (ref 0–99)
NONHDL: 91.28
Triglycerides: 119 mg/dL (ref 0.0–149.0)
VLDL: 23.8 mg/dL (ref 0.0–40.0)

## 2017-08-13 LAB — URINALYSIS, ROUTINE W REFLEX MICROSCOPIC
BILIRUBIN URINE: NEGATIVE
Hgb urine dipstick: NEGATIVE
Ketones, ur: NEGATIVE
Leukocytes, UA: NEGATIVE
Nitrite: NEGATIVE
RBC / HPF: NONE SEEN (ref 0–?)
SPECIFIC GRAVITY, URINE: 1.015 (ref 1.000–1.030)
Total Protein, Urine: NEGATIVE
Urine Glucose: NEGATIVE
Urobilinogen, UA: 0.2 (ref 0.0–1.0)
pH: 7 (ref 5.0–8.0)

## 2017-08-13 LAB — TSH: TSH: 1.36 u[IU]/mL (ref 0.35–4.50)

## 2017-08-13 LAB — HEMOGLOBIN A1C: Hgb A1c MFr Bld: 6.8 % — ABNORMAL HIGH (ref 4.6–6.5)

## 2017-08-13 NOTE — Patient Instructions (Signed)

## 2017-08-13 NOTE — Progress Notes (Signed)
Subjective:  Patient ID: Emily Wheeler, female    DOB: 1956/04/26  Age: 62 y.o. MRN: [id number]  CC: Hypertension; Hyperlipidemia; Diabetes; Annual Exam; and Anemia   HPI Emily Wheeler presents for a CPX and f/up - she complains of dark toenails and wants to see a podiatrist.  She has chronic DOE but no recent concerns with edema, weight gain, fatigue, orthopnea, or palpitations.  She continues to have mild arthralgias but has had no recent episodes of red or swollen joints.  Outpatient Medications Prior to Visit  Medication Sig Dispense Refill  . Albuterol Sulfate (PROAIR RESPICLICK) 888 (90 Base) MCG/ACT AEPB Inhale 2 puffs into the lungs 4 (four) times daily as needed. 1 each 11  . allopurinol (ZYLOPRIM) 100 MG tablet Take 2 tablets (200 mg total) by mouth daily. 180 tablet 0  . aspirin EC 81 MG tablet Take 1 tablet (81 mg total) by mouth daily. 90 tablet 3  . atorvastatin (LIPITOR) 40 MG tablet TAKE 1 TABLET BY MOUTH ONCE DAILY 90 tablet 0  . Blood Glucose Monitoring Suppl (ACCU-CHEK GUIDE) w/Device KIT 1 Act by Does not apply route 3 (three) times daily. 2 kit 0  . Calcium Carb-Cholecalciferol (CALCIUM 600 + D PO) Take 1 tablet by mouth daily.    . carvedilol (COREG) 6.25 MG tablet TAKE 1 TABLET BY MOUTH TWICE DAILY WITH A MEAL 180 tablet 0  . citalopram (CELEXA) 20 MG tablet TAKE ONE TABLET BY MOUTH ONCE DAILY 90 tablet 1  . dexlansoprazole (DEXILANT) 60 MG capsule Take 1 capsule (60 mg total) by mouth daily. 90 capsule 1  . folic acid (FOLVITE) 1 MG tablet TAKE TWO (2) TABLETS (2 MG TOTAL)  BY MOUTH DAILY    . gabapentin (NEURONTIN) 600 MG tablet Take 1 tablet (600 mg total) by mouth 3 (three) times daily. 270 tablet 1  . glucose blood (ACCU-CHEK GUIDE) test strip Use TID 100 each 12  . hydrochlorothiazide (HYDRODIURIL) 25 MG tablet Take 1 tablet (25 mg total) by mouth daily. 90 tablet 1  . leflunomide (ARAVA) 20 MG tablet TAKE ONE TABLET BY MOUTH ONCE DAILY 90 tablet 0  .  losartan (COZAAR) 100 MG tablet Take 1 tablet (100 mg total) by mouth daily. 90 tablet 3  . metFORMIN (GLUCOPHAGE) 500 MG tablet Take 1 tablet (500 mg total) by mouth 2 (two) times daily with a meal. 180 tablet 1  . methotrexate 2.5 MG tablet     . metoCLOPramide (REGLAN) 5 MG tablet Take 1 tablet (5 mg total) by mouth at bedtime. 30 tablet 5  . Omega-3 Fatty Acids (FISH OIL) 1200 MG CAPS Take 1,200 mg by mouth daily.     . potassium chloride SA (K-DUR,KLOR-CON) 20 MEQ tablet Take 1 tablet (20 mEq total) by mouth 2 (two) times daily. 60 tablet 5  . spironolactone (ALDACTONE) 25 MG tablet TAKE ONE TABLET BY MOUTH ONCE DAILY 90 tablet 2  . vitamin C (ASCORBIC ACID) 500 MG tablet Take 500 mg by mouth daily as needed (for cold symptoms).     No facility-administered medications prior to visit.     ROS Review of Systems  Constitutional: Negative.  Negative for diaphoresis, fatigue and unexpected weight change.  HENT: Negative.  Negative for trouble swallowing.   Eyes: Negative for visual disturbance.  Respiratory: Positive for shortness of breath. Negative for cough, chest tightness and wheezing.   Cardiovascular: Negative for chest pain, palpitations and leg swelling.  Gastrointestinal: Negative for abdominal pain, constipation,  diarrhea, nausea and vomiting.  Endocrine: Negative.   Genitourinary: Negative.  Negative for decreased urine volume, difficulty urinating and urgency.  Musculoskeletal: Positive for arthralgias. Negative for back pain and myalgias.  Skin: Negative.  Negative for color change and pallor.  Allergic/Immunologic: Negative.   Neurological: Negative.  Negative for dizziness and weakness.  Hematological: Negative for adenopathy. Does not bruise/bleed easily.  Psychiatric/Behavioral: Negative.     Objective:  BP 128/62 (BP Location: Left Arm, Patient Position: Sitting, Cuff Size: Large)   Pulse 77   Temp 98.7 F (37.1 C) (Oral)   Ht _0  (1.651 m)   Wt 204 lb 0.6 oz  (92.6 kg)   LMP 05/29/1991   SpO2 97%   BMI 33.95 kg/m   BP Readings from Last 3 Encounters:  08/13/17 128/62  05/17/17 126/82  02/19/17 118/70    Wt Readings from Last 3 Encounters:  08/13/17 204 lb 0.6 oz (92.6 kg)  05/17/17 205 lb (93 kg)  02/19/17 207 lb (93.9 kg)    Physical Exam  Constitutional: She is oriented to person, place, and time. No distress.  HENT:  Mouth/Throat: Oropharynx is clear and moist. No oropharyngeal exudate.  Eyes: Conjunctivae are normal. Right eye exhibits no discharge. Left eye exhibits no discharge. No scleral icterus.  Neck: Normal range of motion. No JVD present. No thyromegaly present.  Cardiovascular: Normal rate, regular rhythm and normal heart sounds. Exam reveals no gallop and no friction rub.  No murmur heard. Pulmonary/Chest: Effort normal and breath sounds normal. No respiratory distress. She has no wheezes. She has no rales.  Abdominal: Soft. Bowel sounds are normal. She exhibits no distension and no mass. There is no tenderness. There is no guarding.  Musculoskeletal: Normal range of motion. She exhibits no edema, tenderness or deformity.  Lymphadenopathy:    She has no cervical adenopathy.  Neurological: She is alert and oriented to person, place, and time.  Skin: Skin is warm and dry. No rash noted. She is not diaphoretic. No erythema. No pallor.  All 10 toenails show diffuse nailbed darkening with a faint degree of subungual debris.  Psychiatric: She has a normal mood and affect. Her behavior is normal. Judgment and thought content normal.  Vitals reviewed.   Lab Results  Component Value Date   WBC 4.4 08/13/2017   HGB 10.7 (L) 08/13/2017   HCT 32.8 (L) 08/13/2017   PLT 271.0 08/13/2017   GLUCOSE 112 (H) 08/13/2017   CHOL 134 08/13/2017   TRIG 119.0 08/13/2017   HDL 43.10 08/13/2017   LDLDIRECT 162.5 01/30/2013   LDLCALC 67 08/13/2017   ALT 14 08/13/2017   AST 14 08/13/2017   NA 137 08/13/2017   K 4.0 08/13/2017   CL  103 08/13/2017   CREATININE 1.10 08/13/2017   BUN 19 08/13/2017   CO2 27 08/13/2017   TSH 1.36 08/13/2017   INR 0.9 10/26/2014   HGBA1C 6.8 (H) 08/13/2017   MICROALBUR <0.7 08/13/2017    Nm Gastric Emptying  Result Date: 03/11/2017 CLINICAL DATA:  Vomiting, nausea. EXAM: NUCLEAR MEDICINE GASTRIC EMPTYING SCAN TECHNIQUE: After oral ingestion of radiolabeled meal, sequential abdominal images were obtained for 4 hours. Percentage of activity emptying the stomach was calculated at 1 hour, 2 hour, 3 hour, and 4 hours. RADIOPHARMACEUTICALS:  2.1 mCi Tc-42msulfur colloid in standardized meal COMPARISON:  None. FINDINGS: Expected location of the stomach in the left upper quadrant. Ingested meal empties the stomach gradually over the course of the study. 0.4% emptied at 1  hr ( normal >= 10%) 4.3% emptied at 2 hr ( normal >= 40%) 11.8% emptied at 3 hr ( normal >= 70%) 16% emptied at 4 hr ( normal >= 90%) IMPRESSION: Delayed gastric emptying study. Electronically Signed   By: Marijo Conception, M.D.   On: 03/11/2017 12:27    Assessment & Plan:   Emily Wheeler was seen today for hypertension, hyperlipidemia, diabetes, annual exam and anemia.  Diagnoses and all orders for this visit:  Essential hypertension, benign- Her blood pressure is well controlled.  Electrolytes and renal function are normal. -     Comprehensive metabolic panel; Future -     CBC with Differential/Platelet; Future -     Urinalysis, Routine w reflex microscopic; Future -     TSH; Future  Type 2 diabetes mellitus with complication, without long-term current use of insulin (Emily Wheeler)- Her A1c is up to 6.8%.  She does not want to add another medication so will continue metformin at the current dose.  She agrees to improve her lifestyle modifications. -     Comprehensive metabolic panel; Future -     Microalbumin / creatinine urine ratio; Future -     Hemoglobin A1c; Future -     Ambulatory referral to Ophthalmology  Hyperlipidemia with  target LDL less than 100 - She has achieved her LDL goal and is doing well on the statin. -     TSH; Future  Routine general medical examination at a health care facility- Exam completed, labs reviewed, vaccines reviewed, mammogram and colon cancer screening are up-to-date, Pap smear is not due, patient education material was given. -     Lipid panel; Future -     HIV antibody; Future  PAD (peripheral artery disease) ([place])- She is asymptomatic with respect to this.  Will continue aggressive risk factor modification.  Onychomycosis of toenail -     Ambulatory referral to Podiatry  Need for pneumococcal vaccination -     Pneumococcal polysaccharide vaccine 23-valent greater than or equal to 2yo subcutaneous/IM  Deficiency anemia- She has developed a new onset anemia.  I have asked her to return to be screened for vitamin deficiencies. -     IBC panel; Future -     Vitamin B12; Future -     Ferritin; Future -     Folate; Future -     Vitamin B1; Future   I am having Emily Wheeler maintain her Fish Oil, aspirin EC, Calcium Carb-Cholecalciferol (CALCIUM 600 + D PO), vitamin C, potassium chloride SA, methotrexate, ACCU-CHEK GUIDE, glucose blood, folic acid, losartan, citalopram, dexlansoprazole, Albuterol Sulfate, spironolactone, gabapentin, hydrochlorothiazide, metoCLOPramide, metFORMIN, leflunomide, carvedilol, allopurinol, and atorvastatin.  No orders of the defined types were placed in this encounter.    Follow-up: Return in about 6 months (around 02/13/2018).  Scarlette Calico, MD

## 2017-08-14 ENCOUNTER — Other Ambulatory Visit (INDEPENDENT_AMBULATORY_CARE_PROVIDER_SITE_OTHER): Payer: Medicare Other

## 2017-08-14 DIAGNOSIS — D539 Nutritional anemia, unspecified: Secondary | ICD-10-CM | POA: Diagnosis not present

## 2017-08-14 LAB — VITAMIN B12: Vitamin B-12: 294 pg/mL (ref 211–911)

## 2017-08-14 LAB — IBC PANEL
IRON: 99 ug/dL (ref 42–145)
Saturation Ratios: 24.1 % (ref 20.0–50.0)
Transferrin: 293 mg/dL (ref 212.0–360.0)

## 2017-08-14 LAB — FOLATE: Folate: >23.6 ng/mL (ref >5.9)

## 2017-08-14 LAB — FERRITIN: Ferritin: 33.6 ng/mL (ref 10.0–291.0)

## 2017-08-14 LAB — HIV ANTIBODY (ROUTINE TESTING W REFLEX): HIV: NONREACTIVE

## 2017-08-15 ENCOUNTER — Encounter: Payer: Self-pay | Admitting: Internal Medicine

## 2017-08-16 ENCOUNTER — Other Ambulatory Visit: Payer: Self-pay | Admitting: Internal Medicine

## 2017-08-16 ENCOUNTER — Encounter: Payer: Self-pay | Admitting: Internal Medicine

## 2017-08-16 DIAGNOSIS — D518 Other vitamin B12 deficiency anemias: Secondary | ICD-10-CM

## 2017-08-16 MED ORDER — CYANOCOBALAMIN 2000 MCG PO TABS: 2000.0000 ug | ORAL_TABLET | Freq: Every day | ORAL | 1 refills | Status: DC

## 2017-08-18 ENCOUNTER — Encounter: Payer: Self-pay | Admitting: Internal Medicine

## 2017-08-18 LAB — VITAMIN B1: Vitamin B1 (Thiamine): 9 nmol/L (ref 8–30)

## 2017-08-21 ENCOUNTER — Ambulatory Visit: Payer: Self-pay | Admitting: Family Medicine

## 2017-08-22 ENCOUNTER — Other Ambulatory Visit: Payer: Self-pay | Admitting: Internal Medicine

## 2017-09-02 ENCOUNTER — Other Ambulatory Visit: Payer: Self-pay | Admitting: Internal Medicine

## 2017-09-02 DIAGNOSIS — M069 Rheumatoid arthritis, unspecified: Secondary | ICD-10-CM

## 2017-09-19 ENCOUNTER — Encounter: Payer: Self-pay | Admitting: Internal Medicine

## 2017-09-19 ENCOUNTER — Ambulatory Visit (INDEPENDENT_AMBULATORY_CARE_PROVIDER_SITE_OTHER): Payer: Medicare Other | Admitting: Internal Medicine

## 2017-09-19 VITALS — BP 120/66 | HR 73 | Temp 98.7°F | Resp 16 | Ht 65.0 in | Wt 204.8 lb

## 2017-09-19 DIAGNOSIS — D638 Anemia in other chronic diseases classified elsewhere: Secondary | ICD-10-CM

## 2017-09-19 DIAGNOSIS — M48061 Spinal stenosis, lumbar region without neurogenic claudication: Secondary | ICD-10-CM

## 2017-09-19 DIAGNOSIS — D513 Other dietary vitamin B12 deficiency anemia: Secondary | ICD-10-CM

## 2017-09-19 DIAGNOSIS — M069 Rheumatoid arthritis, unspecified: Secondary | ICD-10-CM

## 2017-09-19 DIAGNOSIS — M4316 Spondylolisthesis, lumbar region: Secondary | ICD-10-CM

## 2017-09-19 MED ORDER — METHYLPREDNISOLONE ACETATE 80 MG/ML IJ SUSP
120.0000 mg | Freq: Once | INTRAMUSCULAR | Status: AC
  Administered 2017-09-19: 120 mg via INTRAMUSCULAR

## 2017-09-19 NOTE — Progress Notes (Signed)
Subjective:  Patient ID: Emily Wheeler, female    DOB: [birth year]  Age: 62 y.o. MRN: [id number]  CC: Osteoarthritis and Anemia   HPI [name] presents for f/up - She complains of a recent flareup in her musculoskeletal pain.  She has pain in multiple different spots including her neck, large joints, and small joints.  She is also had some swelling in her right elbow and the MCP joints on her left hand.  She denies fever, chills, rash, shortness of breath, wheezing, or paresthesias.  Outpatient Medications Prior to Visit  Medication Sig Dispense Refill  . Albuterol Sulfate (PROAIR RESPICLICK) 701 (90 Base) MCG/ACT AEPB Inhale 2 puffs into the lungs 4 (four) times daily as needed. 1 each 11  . allopurinol (ZYLOPRIM) 100 MG tablet Take 2 tablets (200 mg total) by mouth daily. 180 tablet 0  . aspirin EC 81 MG tablet Take 1 tablet (81 mg total) by mouth daily. 90 tablet 3  . atorvastatin (LIPITOR) 40 MG tablet TAKE 1 TABLET BY MOUTH ONCE DAILY 90 tablet 0  . Blood Glucose Monitoring Suppl (ACCU-CHEK GUIDE) w/Device KIT 1 Act by Does not apply route 3 (three) times daily. 2 kit 0  . Calcium Carb-Cholecalciferol (CALCIUM 600 + D PO) Take 1 tablet by mouth daily.    . carvedilol (COREG) 6.25 MG tablet TAKE 1 TABLET BY MOUTH TWICE DAILY WITH A MEAL 180 tablet 0  . citalopram (CELEXA) 20 MG tablet Take 1 tablet (20 mg total) by mouth daily. 90 tablet 1  . cyanocobalamin 2000 MCG tablet Take 1 tablet (2,000 mcg total) by mouth daily. 90 tablet 1  . dexlansoprazole (DEXILANT) 60 MG capsule Take 1 capsule (60 mg total) by mouth daily. 90 capsule 1  . folic acid (FOLVITE) 1 MG tablet TAKE TWO (2) TABLETS (2 MG TOTAL)  BY MOUTH DAILY    . gabapentin (NEURONTIN) 600 MG tablet Take 1 tablet (600 mg total) by mouth 3 (three) times daily. 270 tablet 1  . glucose blood (ACCU-CHEK GUIDE) test strip Use TID 100 each 12  . hydrochlorothiazide (HYDRODIURIL) 25 MG tablet Take 1 tablet (25 mg total) by  mouth daily. 90 tablet 1  . leflunomide (ARAVA) 20 MG tablet TAKE 1 TABLET BY MOUTH ONCE DAILY 90 tablet 0  . losartan (COZAAR) 100 MG tablet Take 1 tablet (100 mg total) by mouth daily. 90 tablet 3  . metFORMIN (GLUCOPHAGE) 500 MG tablet Take 1 tablet (500 mg total) by mouth 2 (two) times daily with a meal. 180 tablet 1  . methotrexate 2.5 MG tablet     . metoCLOPramide (REGLAN) 5 MG tablet Take 1 tablet (5 mg total) by mouth at bedtime. 30 tablet 5  . Omega-3 Fatty Acids (FISH OIL) 1200 MG CAPS Take 1,200 mg by mouth daily.     . potassium chloride SA (K-DUR,KLOR-CON) 20 MEQ tablet Take 1 tablet (20 mEq total) by mouth 2 (two) times daily. 60 tablet 5  . spironolactone (ALDACTONE) 25 MG tablet TAKE ONE TABLET BY MOUTH ONCE DAILY 90 tablet 2  . vitamin C (ASCORBIC ACID) 500 MG tablet Take 500 mg by mouth daily as needed (for cold symptoms).     No facility-administered medications prior to visit.     ROS Review of Systems  Constitutional: Negative.  Negative for chills, fatigue and fever.  HENT: Negative.   Eyes: Negative for visual disturbance.  Respiratory: Negative for cough, chest tightness, shortness of breath and wheezing.   Cardiovascular:  Negative for chest pain and palpitations.  Gastrointestinal: Negative for abdominal pain, diarrhea, nausea and vomiting.  Genitourinary: Negative.  Negative for difficulty urinating.  Musculoskeletal: Positive for arthralgias and back pain. Negative for neck pain.  Skin: Negative.  Negative for color change and rash.  Allergic/Immunologic: Negative.   Neurological: Negative.  Negative for dizziness, weakness and numbness.  Hematological: Negative for adenopathy. Does not bruise/bleed easily.  Psychiatric/Behavioral: Negative.     Objective:  BP 120/66 (BP Location: Left Arm, Patient Position: Sitting, Cuff Size: Large)   Pulse 73   Temp 98.7 F (37.1 C) (Oral)   Resp 16   Ht _0  (1.651 m)   Wt 204 lb 12 oz (92.9 kg)   LMP 05/29/1991    SpO2 98%   BMI 34.07 kg/m   BP Readings from Last 3 Encounters:  09/19/17 120/66  08/13/17 128/62  05/17/17 126/82    Wt Readings from Last 3 Encounters:  09/19/17 204 lb 12 oz (92.9 kg)  08/13/17 204 lb 0.6 oz (92.6 kg)  05/17/17 205 lb (93 kg)    Physical Exam  Constitutional: She is oriented to person, place, and time. No distress.  HENT:  Mouth/Throat: Oropharynx is clear and moist. No oropharyngeal exudate.  Eyes: Conjunctivae are normal. No scleral icterus.  Neck: Normal range of motion. Neck supple. No JVD present. No thyromegaly present.  Cardiovascular: Normal rate, regular rhythm and normal heart sounds. Exam reveals no gallop and no friction rub.  No murmur heard. Pulmonary/Chest: Effort normal and breath sounds normal. No stridor. No respiratory distress. She has no wheezes. She has no rales.  Abdominal: Soft. Bowel sounds are normal. She exhibits no mass. There is no tenderness.  Musculoskeletal: Normal range of motion.  There is mild synovitis in the right elbow and the left MCP joints.  Lymphadenopathy:    She has no cervical adenopathy.  Neurological: She is alert and oriented to person, place, and time.  Skin: Skin is warm and dry. No rash noted. She is not diaphoretic.  Vitals reviewed.   Lab Results  Component Value Date   WBC 4.4 08/13/2017   HGB 10.7 (L) 08/13/2017   HCT 32.8 (L) 08/13/2017   PLT 271.0 08/13/2017   GLUCOSE 112 (H) 08/13/2017   CHOL 134 08/13/2017   TRIG 119.0 08/13/2017   HDL 43.10 08/13/2017   LDLDIRECT 162.5 01/30/2013   LDLCALC 67 08/13/2017   ALT 14 08/13/2017   AST 14 08/13/2017   NA 137 08/13/2017   K 4.0 08/13/2017   CL 103 08/13/2017   CREATININE 1.10 08/13/2017   BUN 19 08/13/2017   CO2 27 08/13/2017   TSH 1.36 08/13/2017   INR 0.9 10/26/2014   HGBA1C 6.8 (H) 08/13/2017   MICROALBUR <0.7 08/13/2017    Nm Gastric Emptying  Result Date: 03/11/2017 CLINICAL DATA:  Vomiting, nausea. EXAM: NUCLEAR MEDICINE  GASTRIC EMPTYING SCAN TECHNIQUE: After oral ingestion of radiolabeled meal, sequential abdominal images were obtained for 4 hours. Percentage of activity emptying the stomach was calculated at 1 hour, 2 hour, 3 hour, and 4 hours. RADIOPHARMACEUTICALS:  2.1 mCi Tc-42msulfur colloid in standardized meal COMPARISON:  None. FINDINGS: Expected location of the stomach in the left upper quadrant. Ingested meal empties the stomach gradually over the course of the study. 0.4% emptied at 1 hr ( normal >= 10%) 4.3% emptied at 2 hr ( normal >= 40%) 11.8% emptied at 3 hr ( normal >= 70%) 16% emptied at 4 hr ( normal >= 90%)  IMPRESSION: Delayed gastric emptying study. Electronically Signed   By: Marijo Conception, M.D.   On: 03/11/2017 12:27    Assessment & Plan:   Skyley was seen today for osteoarthritis and anemia.  Diagnoses and all orders for this visit:  Rheumatoid arthritis involving multiple joints ([place])- Will treat with an injection of methylprednisolone. -     methylPREDNISolone acetate (DEPO-MEDROL) injection 120 mg  Spondylolisthesis of lumbar region- As above  Spinal stenosis of lumbar region without neurogenic claudication  Anemia of chronic disease- This is stable and she is asymptomatic with respect to this.  Other dietary vitamin B12 deficiency anemia- Will continue B12 replacement therapy.   I am having Carolee I. Schueler maintain her Fish Oil, aspirin EC, Calcium Carb-Cholecalciferol (CALCIUM 600 + D PO), vitamin C, potassium chloride SA, methotrexate, ACCU-CHEK GUIDE, glucose blood, folic acid, losartan, dexlansoprazole, Albuterol Sulfate, spironolactone, gabapentin, hydrochlorothiazide, metoCLOPramide, metFORMIN, carvedilol, allopurinol, atorvastatin, cyanocobalamin, citalopram, and leflunomide. We administered methylPREDNISolone acetate.  Meds ordered this encounter  Medications  . methylPREDNISolone acetate (DEPO-MEDROL) injection 120 mg     Follow-up: Return in about 3 months  (around 12/19/2017).  Scarlette Calico, MD

## 2017-09-19 NOTE — Patient Instructions (Signed)

## 2017-10-11 ENCOUNTER — Other Ambulatory Visit: Payer: Self-pay | Admitting: Internal Medicine

## 2017-10-11 DIAGNOSIS — I10 Essential (primary) hypertension: Secondary | ICD-10-CM

## 2017-10-11 NOTE — Telephone Encounter (Signed)
Called and informed patient.  She will call back to schedule

## 2017-10-11 NOTE — Telephone Encounter (Signed)
Pt is due for follow up in July.

## 2017-10-13 ENCOUNTER — Other Ambulatory Visit: Payer: Self-pay | Admitting: Internal Medicine

## 2017-10-13 DIAGNOSIS — I10 Essential (primary) hypertension: Secondary | ICD-10-CM

## 2017-10-25 DIAGNOSIS — H2513 Age-related nuclear cataract, bilateral: Secondary | ICD-10-CM | POA: Diagnosis not present

## 2017-10-25 DIAGNOSIS — H5203 Hypermetropia, bilateral: Secondary | ICD-10-CM | POA: Diagnosis not present

## 2017-10-25 DIAGNOSIS — E119 Type 2 diabetes mellitus without complications: Secondary | ICD-10-CM | POA: Diagnosis not present

## 2017-10-25 LAB — HM DIABETES EYE EXAM

## 2017-10-29 IMAGING — NM NM GASTRIC EMPTYING
5 series · 5 of 5 positions shown · non-contrast
Comparison: None.

CLINICAL DATA: Vomiting, nausea.

EXAM:
NUCLEAR MEDICINE GASTRIC EMPTYING SCAN
TECHNIQUE: After oral ingestion of radiolabeled meal, sequential abdominal
images were obtained for 4 hours. Percentage of activity emptying
the stomach was calculated at 1 hour, 2 hour, 3 hour, and 4 hours.
RADIOPHARMACEUTICALS:  2.1 mCi 4c-HHm sulfur colloid in standardized
meal

[Series 1: 0 min · 4.14mm/px · 1 of 1 slices shown]
[im 1/1]
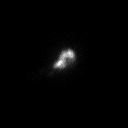

[Series 2: 1 hr · 4.14mm/px · 1 of 1 slices shown]
[im 1/1]
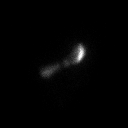

[Series 3: 2 hr · 4.14mm/px · 1 of 1 slices shown]
[im 1/1]
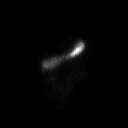

[Series 4: 3 hr · 4.14mm/px · 1 of 1 slices shown]
[im 1/1]
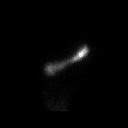

[Series 5: 4 hr · 4.14mm/px · 1 of 1 slices shown]
[im 1/1]
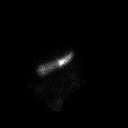

[5 of 5 positions shown; findings below may reference images not displayed]

FINDINGS: Expected location of the stomach in the left upper quadrant.
Ingested meal empties the stomach gradually over the course of the
study.

0.4% emptied at 1 hr ( normal >= 10%)

4.3% emptied at 2 hr ( normal >= 40%)

11.8% emptied at 3 hr ( normal >= 70%)

16% emptied at 4 hr ( normal >= 90%)
IMPRESSION: Delayed gastric emptying study.

## 2017-10-30 DIAGNOSIS — M2550 Pain in unspecified joint: Secondary | ICD-10-CM | POA: Diagnosis not present

## 2017-10-30 DIAGNOSIS — M0579 Rheumatoid arthritis with rheumatoid factor of multiple sites without organ or systems involvement: Secondary | ICD-10-CM | POA: Diagnosis not present

## 2017-10-30 DIAGNOSIS — M150 Primary generalized (osteo)arthritis: Secondary | ICD-10-CM | POA: Diagnosis not present

## 2017-10-30 DIAGNOSIS — M1A09X0 Idiopathic chronic gout, multiple sites, without tophus (tophi): Secondary | ICD-10-CM | POA: Diagnosis not present

## 2017-10-30 DIAGNOSIS — M5136 Other intervertebral disc degeneration, lumbar region: Secondary | ICD-10-CM | POA: Diagnosis not present

## 2017-10-31 ENCOUNTER — Other Ambulatory Visit: Payer: Self-pay | Admitting: Internal Medicine

## 2017-10-31 DIAGNOSIS — E785 Hyperlipidemia, unspecified: Secondary | ICD-10-CM

## 2017-10-31 DIAGNOSIS — M069 Rheumatoid arthritis, unspecified: Secondary | ICD-10-CM

## 2017-10-31 DIAGNOSIS — E118 Type 2 diabetes mellitus with unspecified complications: Secondary | ICD-10-CM

## 2017-11-26 ENCOUNTER — Other Ambulatory Visit (INDEPENDENT_AMBULATORY_CARE_PROVIDER_SITE_OTHER): Payer: Medicare Other

## 2017-11-26 ENCOUNTER — Encounter: Payer: Self-pay | Admitting: Internal Medicine

## 2017-11-26 ENCOUNTER — Ambulatory Visit (INDEPENDENT_AMBULATORY_CARE_PROVIDER_SITE_OTHER): Payer: Medicare Other | Admitting: Internal Medicine

## 2017-11-26 VITALS — BP 130/80 | HR 70 | Temp 98.8°F | Resp 16 | Ht 65.0 in | Wt 202.2 lb

## 2017-11-26 DIAGNOSIS — D510 Vitamin B12 deficiency anemia due to intrinsic factor deficiency: Secondary | ICD-10-CM

## 2017-11-26 DIAGNOSIS — E118 Type 2 diabetes mellitus with unspecified complications: Secondary | ICD-10-CM

## 2017-11-26 DIAGNOSIS — I10 Essential (primary) hypertension: Secondary | ICD-10-CM

## 2017-11-26 DIAGNOSIS — M4316 Spondylolisthesis, lumbar region: Secondary | ICD-10-CM | POA: Diagnosis not present

## 2017-11-26 DIAGNOSIS — D638 Anemia in other chronic diseases classified elsewhere: Secondary | ICD-10-CM

## 2017-11-26 DIAGNOSIS — M48062 Spinal stenosis, lumbar region with neurogenic claudication: Secondary | ICD-10-CM

## 2017-11-26 LAB — CBC WITH DIFFERENTIAL/PLATELET
Basophils Absolute: 0.1 10*3/uL (ref 0.0–0.1)
Basophils Relative: 1.4 % (ref 0.0–3.0)
EOS PCT: 5.2 % — AB (ref 0.0–5.0)
Eosinophils Absolute: 0.3 10*3/uL (ref 0.0–0.7)
HCT: 33.9 % — ABNORMAL LOW (ref 36.0–46.0)
HEMOGLOBIN: 11.2 g/dL — AB (ref 12.0–15.0)
Lymphocytes Relative: 48.6 % — ABNORMAL HIGH (ref 12.0–46.0)
Lymphs Abs: 2.4 10*3/uL (ref 0.7–4.0)
MCHC: 32.9 g/dL (ref 30.0–36.0)
MCV: 95.8 fl (ref 78.0–100.0)
MONO ABS: 0.5 10*3/uL (ref 0.1–1.0)
MONOS PCT: 9.3 % (ref 3.0–12.0)
NEUTROS PCT: 35.5 % — AB (ref 43.0–77.0)
Neutro Abs: 1.8 10*3/uL (ref 1.4–7.7)
PLATELETS: 239 10*3/uL (ref 150.0–400.0)
RBC: 3.54 Mil/uL — AB (ref 3.87–5.11)
RDW: 15.1 % (ref 11.5–15.5)
WBC: 5 10*3/uL (ref 4.0–10.5)

## 2017-11-26 LAB — BASIC METABOLIC PANEL
BUN: 17 mg/dL (ref 6–23)
CO2: 25 mEq/L (ref 19–32)
Calcium: 9.7 mg/dL (ref 8.4–10.5)
Chloride: 107 mEq/L (ref 96–112)
Creatinine, Ser: 1.05 mg/dL (ref 0.40–1.20)
GFR: 67.91 mL/min (ref >60.00)
Glucose, Bld: 132 mg/dL — ABNORMAL HIGH (ref 70–99)
POTASSIUM: 4 meq/L (ref 3.5–5.1)
SODIUM: 141 meq/L (ref 135–145)

## 2017-11-26 LAB — HM DIABETES FOOT EXAM

## 2017-11-26 LAB — VITAMIN B12: Vitamin B-12: 805 pg/mL (ref 211–911)

## 2017-11-26 LAB — HEMOGLOBIN A1C: HEMOGLOBIN A1C: 6.4 % (ref 4.6–6.5)

## 2017-11-26 NOTE — Progress Notes (Signed)
Subjective:  Patient ID: Emily Wheeler, female    DOB: 12/19/1955  Age: 62 y.o. MRN: [id number]  CC: Anemia; Diabetes; and Back Pain   HPI Emily Wheeler presents for f/up - she complains of low back pain that radiates into both hips and into her right lower extremity.  She also complains of tingling in her right lower extremity.  She had a complicated back surgery about 4 years ago done by a neurosurgeon.  She tells me he will not see her anymore because she has an outstanding balance.  She recently saw her rheumatologist and she tells me that he told her the symptoms were not coming from her hips.  She otherwise feels well and offers no other complaints today.  Outpatient Medications Prior to Visit  Medication Sig Dispense Refill  . Albuterol Sulfate (PROAIR RESPICLICK) 778 (90 Base) MCG/ACT AEPB Inhale 2 puffs into the lungs 4 (four) times daily as needed. 1 each 11  . allopurinol (ZYLOPRIM) 100 MG tablet Take 2 tablets (200 mg total) by mouth daily. 180 tablet 0  . aspirin EC 81 MG tablet Take 1 tablet (81 mg total) by mouth daily. 90 tablet 3  . atorvastatin (LIPITOR) 40 MG tablet Take 1 tablet (40 mg total) by mouth daily. 90 tablet 0  . Blood Glucose Monitoring Suppl (ACCU-CHEK GUIDE) w/Device KIT 1 Act by Does not apply route 3 (three) times daily. 2 kit 0  . Calcium Carb-Cholecalciferol (CALCIUM 600 + D PO) Take 1 tablet by mouth daily.    . carvedilol (COREG) 6.25 MG tablet TAKE 1 TABLET BY MOUTH TWICE DAILY WITH A MEAL 180 tablet 0  . citalopram (CELEXA) 20 MG tablet Take 1 tablet (20 mg total) by mouth daily. 90 tablet 1  . cyanocobalamin 2000 MCG tablet Take 1 tablet (2,000 mcg total) by mouth daily. 90 tablet 1  . dexlansoprazole (DEXILANT) 60 MG capsule Take 1 capsule (60 mg total) by mouth daily. 90 capsule 1  . folic acid (FOLVITE) 1 MG tablet TAKE TWO (2) TABLETS (2 MG TOTAL)  BY MOUTH DAILY    . gabapentin (NEURONTIN) 600 MG tablet Take 1 tablet (600 mg total) by mouth  3 (three) times daily. 270 tablet 1  . glucose blood (ACCU-CHEK GUIDE) test strip Use TID 100 each 12  . hydrochlorothiazide (HYDRODIURIL) 25 MG tablet Take 1 tablet (25 mg total) by mouth daily. 90 tablet 1  . leflunomide (ARAVA) 20 MG tablet TAKE 1 TABLET BY MOUTH ONCE DAILY 90 tablet 0  . losartan (COZAAR) 100 MG tablet Take 1 tablet (100 mg total) by mouth daily. 90 tablet 3  . metFORMIN (GLUCOPHAGE) 500 MG tablet Take 1 tablet (500 mg total) by mouth 2 (two) times daily with a meal. 180 tablet 1  . methotrexate 2.5 MG tablet     . metoCLOPramide (REGLAN) 5 MG tablet Take 1 tablet (5 mg total) by mouth at bedtime. 30 tablet 5  . Omega-3 Fatty Acids (FISH OIL) 1200 MG CAPS Take 1,200 mg by mouth daily.     . potassium chloride SA (K-DUR,KLOR-CON) 20 MEQ tablet Take 1 tablet (20 mEq total) by mouth 2 (two) times daily. 60 tablet 5  . spironolactone (ALDACTONE) 25 MG tablet TAKE ONE TABLET BY MOUTH ONCE DAILY 90 tablet 2  . vitamin C (ASCORBIC ACID) 500 MG tablet Take 500 mg by mouth daily as needed (for cold symptoms).     No facility-administered medications prior to visit.  ROS Review of Systems  Constitutional: Negative.  Negative for appetite change, diaphoresis and fatigue.  HENT: Negative.   Eyes: Negative.  Negative for visual disturbance.  Respiratory: Negative for cough, chest tightness, shortness of breath and wheezing.   Cardiovascular: Negative for chest pain, palpitations and leg swelling.  Gastrointestinal: Negative for abdominal pain, constipation, diarrhea, nausea and vomiting.  Genitourinary: Negative.  Negative for difficulty urinating.  Musculoskeletal: Positive for arthralgias and back pain. Negative for myalgias and neck pain.  Skin: Negative.  Negative for color change and pallor.  Allergic/Immunologic: Negative.   Neurological: Positive for numbness. Negative for dizziness and weakness.  Hematological: Negative for adenopathy. Does not bruise/bleed easily.    Psychiatric/Behavioral: Negative.     Objective:  BP 130/80 (BP Location: Left Arm, Patient Position: Sitting, Cuff Size: Large)   Pulse 70   Temp 98.8 F (37.1 C) (Oral)   Resp 16   Ht _0  (1.651 m)   Wt 202 lb 4 oz (91.7 kg)   LMP 05/29/1991   SpO2 96%   BMI 33.66 kg/m   BP Readings from Last 3 Encounters:  11/26/17 130/80  09/19/17 120/66  08/13/17 128/62    Wt Readings from Last 3 Encounters:  11/26/17 202 lb 4 oz (91.7 kg)  09/19/17 204 lb 12 oz (92.9 kg)  08/13/17 204 lb 0.6 oz (92.6 kg)    Physical Exam  Constitutional: She is oriented to person, place, and time. No distress.  HENT:  Mouth/Throat: Oropharynx is clear and moist. No oropharyngeal exudate.  Eyes: Conjunctivae are normal. No scleral icterus.  Neck: Normal range of motion. Neck supple. No JVD present. No thyromegaly present.  Cardiovascular: Normal rate, regular rhythm and normal heart sounds.  No murmur heard. Pulmonary/Chest: Effort normal and breath sounds normal. No stridor. She has no wheezes. She has no rhonchi. She has no rales.  Abdominal: Soft. Bowel sounds are normal. She exhibits no mass. There is no hepatosplenomegaly. There is no tenderness.  Musculoskeletal: Normal range of motion. She exhibits no edema, tenderness or deformity.       Lumbar back: Normal.  Lymphadenopathy:    She has no cervical adenopathy.  Neurological: She is alert and oriented to person, place, and time. She has normal strength and normal reflexes. She displays no atrophy and no tremor. She exhibits normal muscle tone. She displays a negative Romberg sign. Coordination and gait normal.  NEG SLR in BLE  Skin: Skin is dry. She is not diaphoretic. No erythema. No pallor.  Vitals reviewed.   Lab Results  Component Value Date   WBC 5.0 11/26/2017   HGB 11.2 (L) 11/26/2017   HCT 33.9 (L) 11/26/2017   PLT 239.0 11/26/2017   GLUCOSE 132 (H) 11/26/2017   CHOL 134 08/13/2017   TRIG 119.0 08/13/2017   HDL 43.10  08/13/2017   LDLDIRECT 162.5 01/30/2013   LDLCALC 67 08/13/2017   ALT 14 08/13/2017   AST 14 08/13/2017   NA 141 11/26/2017   K 4.0 11/26/2017   CL 107 11/26/2017   CREATININE 1.05 11/26/2017   BUN 17 11/26/2017   CO2 25 11/26/2017   TSH 1.36 08/13/2017   INR 0.9 10/26/2014   HGBA1C 6.4 11/26/2017   MICROALBUR <0.7 08/13/2017    Nm Gastric Emptying  Result Date: 03/11/2017 CLINICAL DATA:  Vomiting, nausea. EXAM: NUCLEAR MEDICINE GASTRIC EMPTYING SCAN TECHNIQUE: After oral ingestion of radiolabeled meal, sequential abdominal images were obtained for 4 hours. Percentage of activity emptying the stomach was calculated at  1 hour, 2 hour, 3 hour, and 4 hours. RADIOPHARMACEUTICALS:  2.1 mCi Tc-94msulfur colloid in standardized meal COMPARISON:  None. FINDINGS: Expected location of the stomach in the left upper quadrant. Ingested meal empties the stomach gradually over the course of the study. 0.4% emptied at 1 hr ( normal >= 10%) 4.3% emptied at 2 hr ( normal >= 40%) 11.8% emptied at 3 hr ( normal >= 70%) 16% emptied at 4 hr ( normal >= 90%) IMPRESSION: Delayed gastric emptying study. Electronically Signed   By: JMarijo Conception M.D.   On: 03/11/2017 12:27    Assessment & Plan:   Cedrica was seen today for anemia, diabetes and back pain.  Diagnoses and all orders for this visit:  Type 2 diabetes mellitus with complication, without long-term current use of insulin ([place]- Her A1c is at 6.4%.  Her blood sugars are adequately well controlled. -     Basic metabolic panel; Future -     Hemoglobin A1c; Future  Essential hypertension, benign- Her blood pressure is well controlled.  Electrolytes and renal function are normal. -     Basic metabolic panel; Future  Anemia of chronic disease- Improvement noted with an increased H&H. -     CBC with Differential/Platelet; Future  Vitamin B12 deficiency anemia due to intrinsic factor deficiency- Improvement noted in her H&H.  Her B12 level is  normal now. -     CBC with Differential/Platelet; Future -     Vitamin B12; Future  Spondylolisthesis of lumbar region -     Ambulatory referral to Orthopedic Surgery  Spinal stenosis of lumbar region with neurogenic claudication -     Ambulatory referral to Orthopedic Surgery   I am having Tarryn I. Ates maintain her Fish Oil, aspirin EC, Calcium Carb-Cholecalciferol (CALCIUM 600 + D PO), vitamin C, potassium chloride SA, methotrexate, ACCU-CHEK GUIDE, glucose blood, folic acid, losartan, dexlansoprazole, Albuterol Sulfate, spironolactone, gabapentin, metoCLOPramide, cyanocobalamin, citalopram, carvedilol, leflunomide, hydrochlorothiazide, metFORMIN, atorvastatin, and allopurinol.  No orders of the defined types were placed in this encounter.    Follow-up: Return in about 6 months (around 05/29/2018).  TScarlette Calico MD

## 2017-11-26 NOTE — Patient Instructions (Signed)

## 2017-11-27 ENCOUNTER — Other Ambulatory Visit: Payer: Self-pay | Admitting: Internal Medicine

## 2017-11-27 DIAGNOSIS — M48061 Spinal stenosis, lumbar region without neurogenic claudication: Secondary | ICD-10-CM

## 2017-12-15 ENCOUNTER — Other Ambulatory Visit: Payer: Self-pay | Admitting: Cardiology

## 2017-12-25 ENCOUNTER — Other Ambulatory Visit: Payer: Self-pay | Admitting: Internal Medicine

## 2017-12-25 DIAGNOSIS — K219 Gastro-esophageal reflux disease without esophagitis: Secondary | ICD-10-CM

## 2017-12-25 DIAGNOSIS — Z1231 Encounter for screening mammogram for malignant neoplasm of breast: Secondary | ICD-10-CM

## 2018-01-10 ENCOUNTER — Other Ambulatory Visit: Payer: Self-pay | Admitting: Internal Medicine

## 2018-01-10 DIAGNOSIS — I10 Essential (primary) hypertension: Secondary | ICD-10-CM

## 2018-01-13 ENCOUNTER — Other Ambulatory Visit: Payer: Self-pay | Admitting: Cardiology

## 2018-01-13 MED ORDER — SPIRONOLACTONE 25 MG PO TABS: 25.0000 mg | ORAL_TABLET | Freq: Every day | ORAL | 0 refills | Status: DC

## 2018-01-20 ENCOUNTER — Other Ambulatory Visit: Payer: Self-pay | Admitting: Cardiology

## 2018-01-20 DIAGNOSIS — I5021 Acute systolic (congestive) heart failure: Secondary | ICD-10-CM

## 2018-01-20 DIAGNOSIS — E118 Type 2 diabetes mellitus with unspecified complications: Secondary | ICD-10-CM

## 2018-01-20 DIAGNOSIS — I10 Essential (primary) hypertension: Secondary | ICD-10-CM

## 2018-01-23 ENCOUNTER — Ambulatory Visit: Payer: Medicare Other

## 2018-02-10 ENCOUNTER — Other Ambulatory Visit: Payer: Self-pay | Admitting: Internal Medicine

## 2018-02-10 DIAGNOSIS — E118 Type 2 diabetes mellitus with unspecified complications: Secondary | ICD-10-CM

## 2018-02-10 DIAGNOSIS — E785 Hyperlipidemia, unspecified: Secondary | ICD-10-CM

## 2018-02-10 DIAGNOSIS — M545 Low back pain: Secondary | ICD-10-CM | POA: Diagnosis not present

## 2018-02-11 ENCOUNTER — Other Ambulatory Visit: Payer: Self-pay | Admitting: Cardiology

## 2018-02-11 DIAGNOSIS — E118 Type 2 diabetes mellitus with unspecified complications: Secondary | ICD-10-CM

## 2018-02-11 DIAGNOSIS — I10 Essential (primary) hypertension: Secondary | ICD-10-CM

## 2018-02-11 DIAGNOSIS — I5021 Acute systolic (congestive) heart failure: Secondary | ICD-10-CM

## 2018-02-11 NOTE — Telephone Encounter (Signed)
Outpatient Medication Detail    Disp Refills Start End   losartan (COZAAR) 100 MG tablet 90 tablet 0 01/20/2018    Sig: TAKE 1 TABLET BY MOUTH ONCE DAILY   Sent to pharmacy as: losartan (COZAAR) 100 MG tablet   E-Prescribing Status: Receipt confirmed by pharmacy (01/20/2018 1:22 PM EDT)   Associated Diagnoses   Essential hypertension, benign     Type 2 diabetes mellitus with complication, without long-term current use of insulin (HCC)     Acute systolic CHF (congestive heart failure) [facility])     Pharmacy   [id number] - [place], [place] - [address]

## 2018-02-14 ENCOUNTER — Other Ambulatory Visit: Payer: Self-pay | Admitting: Orthopaedic Surgery

## 2018-02-14 DIAGNOSIS — M545 Low back pain: Secondary | ICD-10-CM

## 2018-02-19 ENCOUNTER — Other Ambulatory Visit: Payer: Self-pay | Admitting: Internal Medicine

## 2018-02-19 ENCOUNTER — Ambulatory Visit: Payer: Medicare Other

## 2018-02-19 ENCOUNTER — Ambulatory Visit
Admission: RE | Admit: 2018-02-19 | Discharge: 2018-02-19 | Disposition: A | Discharge status: Home / Self Care | Payer: Medicare Other | Source: Ambulatory Visit | Attending: Internal Medicine | Admitting: Internal Medicine

## 2018-02-19 DIAGNOSIS — E118 Type 2 diabetes mellitus with unspecified complications: Secondary | ICD-10-CM

## 2018-02-19 DIAGNOSIS — Z1231 Encounter for screening mammogram for malignant neoplasm of breast: Secondary | ICD-10-CM | POA: Diagnosis not present

## 2018-02-19 DIAGNOSIS — E785 Hyperlipidemia, unspecified: Secondary | ICD-10-CM

## 2018-02-19 LAB — HM MAMMOGRAPHY

## 2018-02-19 MED ORDER — ATORVASTATIN CALCIUM 40 MG PO TABS: 40.0000 mg | ORAL_TABLET | Freq: Every day | ORAL | 1 refills | Status: DC

## 2018-02-22 ENCOUNTER — Ambulatory Visit
Admission: RE | Admit: 2018-02-22 | Discharge: 2018-02-22 | Disposition: A | Discharge status: Home / Self Care | Payer: Medicare Other | Source: Ambulatory Visit | Attending: Orthopaedic Surgery | Admitting: Orthopaedic Surgery

## 2018-02-22 DIAGNOSIS — M545 Low back pain: Secondary | ICD-10-CM

## 2018-02-22 DIAGNOSIS — M48061 Spinal stenosis, lumbar region without neurogenic claudication: Secondary | ICD-10-CM | POA: Diagnosis not present

## 2018-02-25 ENCOUNTER — Other Ambulatory Visit: Payer: Self-pay | Admitting: Internal Medicine

## 2018-03-06 DIAGNOSIS — M545 Low back pain: Secondary | ICD-10-CM | POA: Diagnosis not present

## 2018-03-06 DIAGNOSIS — M48062 Spinal stenosis, lumbar region with neurogenic claudication: Secondary | ICD-10-CM | POA: Diagnosis not present

## 2018-03-06 DIAGNOSIS — M961 Postlaminectomy syndrome, not elsewhere classified: Secondary | ICD-10-CM | POA: Diagnosis not present

## 2018-03-14 ENCOUNTER — Telehealth: Payer: Self-pay

## 2018-03-14 NOTE — Telephone Encounter (Signed)
   [facility] Pre-operative Risk Assessment    Request for surgical clearance:  1. What type of surgery is being performed?  L2-3  Revision Laminectomy and fusion with TLIF, instrumentation, allograft. Removal/revise as needed hardware L3-S1 and exploration of fusion.   2. When is this surgery scheduled? TBD   3. What type of clearance is required (medical clearance vs. Pharmacy clearance to hold med vs. Both)? BOTH  4. Are there any medications that need to be held prior to surgery and how long? ASA hold for 7 days   5. Practice name and name of physician performing surgery? [facility], Dr. Rennis Harding  6. What is your office phone number [phone] Contact Joy   7.   What is your office fax number  [phone] Attn: Joy  8.   Anesthesia type (None, local, MAC, general) ?General   Emily Wheeler 03/14/2018, 4:54 PM  ____________________________________ Need most recent office note, lab work, EKG if available.

## 2018-03-18 NOTE — Telephone Encounter (Signed)
   Primary Cardiologist:Katarina Delton See, MD  Chart reviewed as part of pre-operative protocol coverage. Because of Emily Wheeler's past medical history and time since last visit, he/she will require a follow-up visit in order to better assess preoperative cardiovascular risk.  Pre-op covering staff: - Please schedule appointment and call patient to inform them. - Please contact requesting surgeon's office via preferred method (i.e, phone, fax) to inform them of need for appointment prior to surgery.  Theodore Demark, PA-C  03/18/2018, 5:41 PM

## 2018-03-19 NOTE — Telephone Encounter (Signed)
Called pt re: surgical clearance and needing an appt for that. Pt already had an apt 03/25/18 with Dr. Delton See. Will address surgical clearance at that time. Pt verbalized understanding.

## 2018-03-25 ENCOUNTER — Ambulatory Visit (INDEPENDENT_AMBULATORY_CARE_PROVIDER_SITE_OTHER): Payer: Medicare Other | Admitting: Cardiology

## 2018-03-25 ENCOUNTER — Encounter: Payer: Self-pay | Admitting: *Deleted

## 2018-03-25 ENCOUNTER — Encounter: Payer: Self-pay | Admitting: Cardiology

## 2018-03-25 ENCOUNTER — Encounter

## 2018-03-25 VITALS — BP 138/82 | HR 61 | Ht 65.0 in | Wt 210.8 lb

## 2018-03-25 DIAGNOSIS — E782 Mixed hyperlipidemia: Secondary | ICD-10-CM

## 2018-03-25 DIAGNOSIS — I428 Other cardiomyopathies: Secondary | ICD-10-CM | POA: Diagnosis not present

## 2018-03-25 DIAGNOSIS — I2510 Atherosclerotic heart disease of native coronary artery without angina pectoris: Secondary | ICD-10-CM | POA: Diagnosis not present

## 2018-03-25 DIAGNOSIS — Z01810 Encounter for preprocedural cardiovascular examination: Secondary | ICD-10-CM

## 2018-03-25 DIAGNOSIS — I5042 Chronic combined systolic (congestive) and diastolic (congestive) heart failure: Secondary | ICD-10-CM

## 2018-03-25 NOTE — Progress Notes (Signed)
03/25/2018 Emily Wheeler   [birth year]  [id number]  Primary Physician [name], MD Primary Cardiologist: Dr. Meda Coffee   Reason for Visit/CC: 6 month f/u for NICM  HPI:  Emily Wheeler is a 62 y.o. female, followed by Dr. Meda Coffee, who presents to clinic today for routine follow-up. She has a history of nonischemic cardiomyopathy that was diagnosed in 2016. She initially presented with a complaint of dyspnea. Echocardiogram in April 2016 showed reduced EF at 35-40%. She initially underwent a nuclear stress test which was abnormal, leading to cardiac catheterization. Cardiac cath in June 2016 showed mild CAD thus a diagnosis of nonischemic cardiomyopathy was made. Her additional past medical history includes hypertension, hyperlipidemia and diabetes. She also has rheumatoid arthritis. Her recent follow-up echocardiogram was 04/10/2016. This continued to show reduced left ventricular ejection fraction at 35-40%. Grade 1 diastolic dysfunction was also noted with diffuse hypokinesis. No valvular abnormalities were noted.  She was last seen by Dr. Meda Coffee November 2017. At that time, she was without complaints however her blood pressure was noted to be elevated and her losartan was increased to 100 mg daily. No other changes made. She presents back to clinic for 6 month follow-up.  Her only recent issues have been problems with rheumatoid arthritis. Her rheumatologist placed her on prednisone in June however she reports that she did not tolerate this well. This resulted in elevations in her blood glucose levels. She had a BMP done on 10/31/2016 which also showed a bump in her serum creatinine to 1.26. She reports that she was seen by her rheumatologist last week, Dr. Amil Amen, and she had repeat laboratory work done at that time. These were results are not available to review however she was told that everything checked out okay and was stable. We will request that these results be faxed to our office  for review.  03/25/2018, the patient is coming after year, she has been doing well, she has a stable dyspnea on moderate exertion, she can walk 14 flight of stairs and carry grocery bags, she denies any chest pain, no dizziness or syncope, no lower extremity edema.  She has been compliant her medications.  Her biggest problem is lower back pain for which she is scheduled for surgery with Dr. Patrice Paradise.  Current Meds  Medication Sig  . Albuterol Sulfate (PROAIR RESPICLICK) 660 (90 Base) MCG/ACT AEPB Inhale 2 puffs into the lungs 4 (four) times daily as needed.  Marland Kitchen allopurinol (ZYLOPRIM) 100 MG tablet TAKE 2 TABLETS BY MOUTH ONCE DAILY  . aspirin EC 81 MG tablet Take 1 tablet (81 mg total) by mouth daily.  Marland Kitchen atorvastatin (LIPITOR) 40 MG tablet Take 1 tablet (40 mg total) by mouth daily.  . Blood Glucose Monitoring Suppl (ACCU-CHEK GUIDE) w/Device KIT 1 Act by Does not apply route 3 (three) times daily.  . Calcium Carb-Cholecalciferol (CALCIUM 600 + D PO) Take 1 tablet by mouth daily.  . carvedilol (COREG) 6.25 MG tablet TAKE 1 TABLET BY MOUTH TWICE DAILY WITH A MEAL  . citalopram (CELEXA) 20 MG tablet Take 1 tablet (20 mg total) by mouth daily.  . cyanocobalamin 2000 MCG tablet Take 1 tablet (2,000 mcg total) by mouth daily.  Marland Kitchen DEXILANT 60 MG capsule TAKE 1 CAPSULE BY MOUTH ONCE DAILY  . folic acid (FOLVITE) 1 MG tablet TAKE TWO (2) TABLETS (2 MG TOTAL)  BY MOUTH DAILY  . gabapentin (NEURONTIN) 600 MG tablet TAKE 1 TABLET BY MOUTH THREE TIMES DAILY  . glucose  blood (ACCU-CHEK GUIDE) test strip Use TID  . hydrochlorothiazide (HYDRODIURIL) 25 MG tablet Take 1 tablet (25 mg total) by mouth daily.  Marland Kitchen leflunomide (ARAVA) 20 MG tablet TAKE 1 TABLET BY MOUTH ONCE DAILY  . losartan (COZAAR) 100 MG tablet TAKE 1 TABLET BY MOUTH ONCE DAILY  . metFORMIN (GLUCOPHAGE) 500 MG tablet Take 1 tablet (500 mg total) by mouth 2 (two) times daily with a meal.  . Omega-3 Fatty Acids (FISH OIL) 1200 MG CAPS Take 1,200 mg  by mouth daily.   . potassium chloride SA (K-DUR,KLOR-CON) 20 MEQ tablet Take 1 tablet (20 mEq total) by mouth 2 (two) times daily.  Marland Kitchen spironolactone (ALDACTONE) 25 MG tablet Take 1 tablet (25 mg total) by mouth daily. Please keep upcoming appt in November for future refills. Thank you  . vitamin C (ASCORBIC ACID) 500 MG tablet Take 500 mg by mouth daily as needed (for cold symptoms).   Allergies  Allergen Reactions  . Lisinopril Cough   Past Medical History:  Diagnosis Date  . Anemia    mild  . Anxiety   . Arthritis   . Asthma   . Cardiomyopathy ([place])   . CHF (congestive heart failure) ([place])   . Diabetes mellitus without complication ([place])   . Frequency of urination    at night  . GERD (gastroesophageal reflux disease)    potassium worsens reflux  . Hemorrhoids, external   . Hyperlipidemia   . Hypertension   . Neuromuscular disorder (HCC)    neuropathy  . Numbness and tingling of both legs   . Rheumatoid arthritis ([place])   . Vertigo    hx of   Family History  Problem Relation Age of Onset  . Early death Father   . Heart disease Father   . Hypertension Sister   . Hypertension Brother   . Diabetes Brother   . Colon polyps Mother   . Hashimoto's thyroiditis Sister   . Colon cancer Maternal Grandmother        in her 71s  . Stomach cancer Maternal Aunt   . Non-Hodgkin's lymphoma Sister   . Alcohol abuse Neg Hx   . COPD Neg Hx   . Depression Neg Hx   . Drug abuse Neg Hx   . Hearing loss Neg Hx   . Hyperlipidemia Neg Hx   . Kidney disease Neg Hx   . Stroke Neg Hx   . Esophageal cancer Neg Hx   . Rectal cancer Neg Hx    Past Surgical History:  Procedure Laterality Date  . ABDOMINAL HYSTERECTOMY    . CARDIAC CATHETERIZATION N/A 10/28/2014   Procedure: Left Heart Cath and Coronary Angiography;  Surgeon: Jettie Booze, MD;  Location: [facility];  Service: Cardiovascular;  Laterality: N/A;  . CARPAL TUNNEL RELEASE Left   . COLONOSCOPY     pt states 11  yr ago in [place] had a colon with 2 polyps- one cecal polyp per pt. one ? location  . COLONOSCOPY W/ POLYPECTOMY    . CYST REMOVAL NECK    . HEMORROIDECTOMY    . MAXIMUM ACCESS (MAS)POSTERIOR LUMBAR INTERBODY FUSION (PLIF) 3 LEVEL N/A 10/08/2013   Procedure: FOR MAXIMUM ACCESS (MAS) POSTERIOR LUMBAR INTERBODY FUSION (PLIF) 3 LEVEL;  Surgeon: Erline Levine, MD;  Location: [facility];  Service: Neurosurgery;  Laterality: N/A;  L3-4 L4-5 L5-S1 maximum access posterior lumbar interbody fusion with decompression  . TUBAL LIGATION     Social History   Socioeconomic History  .  Marital status: Single    Spouse name: Not on file  . Number of children: Not on file  . Years of education: Not on file  . Highest education level: Not on file  Occupational History  . Not on file  Social Needs  . Financial resource strain: Not on file  . Food insecurity:    Worry: Not on file    Inability: Not on file  . Transportation needs:    Medical: Not on file    Non-medical: Not on file  Tobacco Use  . Smoking status: Former Research scientist (life sciences)  . Smokeless tobacco: Never Used  Substance and Sexual Activity  . Alcohol use: Yes    Comment: 3 x a year -rare   . Drug use: No    Types: Marijuana  . Sexual activity: Not Currently  Lifestyle  . Physical activity:    Days per week: Not on file    Minutes per session: Not on file  . Stress: Not on file  Relationships  . Social connections:    Talks on phone: Not on file    Gets together: Not on file    Attends religious service: Not on file    Active member of club or organization: Not on file    Attends meetings of clubs or organizations: Not on file    Relationship status: Not on file  . Intimate partner violence:    Fear of current or ex partner: Not on file    Emotionally abused: Not on file    Physically abused: Not on file    Forced sexual activity: Not on file  Other Topics Concern  . Not on file  Social History Narrative  . Not on file     Review of  Systems: General: negative for chills, fever, night sweats or weight changes.  Cardiovascular: negative for chest pain, dyspnea on exertion, edema, orthopnea, palpitations, paroxysmal nocturnal dyspnea or shortness of breath Dermatological: negative for rash Respiratory: negative for cough or wheezing Urologic: negative for hematuria Abdominal: negative for nausea, vomiting, diarrhea, bright red blood per rectum, melena, or hematemesis Neurologic: negative for visual changes, syncope, or dizziness All other systems reviewed and are otherwise negative except as noted above.   Physical Exam:  Blood pressure 138/82, pulse 61, height _0  (1.651 m), weight 210 lb 12.8 oz (95.6 kg), last menstrual period 05/29/1991.  General appearance: alert, cooperative and no distress Neck: no carotid bruit and no JVD Lungs: clear to auscultation bilaterally Heart: regular rate and rhythm, S1, S2 normal, no murmur, click, rub or gallop Extremities: extremities normal, atraumatic, no cyanosis or edema Pulses: 2+ and symmetric Skin: Skin color, texture, turgor normal. No rashes or lesions Neurologic: Grossly normal  EKG NSR -- personally reviewed    ASSESSMENT AND PLAN:   1. NICM: EF 35-40%. First diagnosed in 2016. LHC at that time with mild nonobstructive CAD.  We will recheck and if still decreased LVEF we will verify with her insurance company coverage of Entresto and prescribed if covered.  Continue carvedilol and spironolactone.  2. HTN: controlled on current regimen.   3. HLD: on atorvastatin, at goal, well-tolerated.  4. CAD: mild nonobstructive CAD noted on cath 10/2014.  Asymptomatic.   5.   Cardiovascular preoperative evaluation, the patient is currently well compensated, she has no signs of CHF or angina, she can certainly achieve at least 6 METS.  There is currently no contraindication from cardiac standpoint for her to undergo scheduled surgery.  Follow-Up w/ Dr.  Fenris Cauble in 6 months.    Ena Dawley, MD [facility] 03/25/2018 2:50 PM

## 2018-03-25 NOTE — Telephone Encounter (Signed)
Lars Masson, MD  Loa Socks, LPN        To Dr. Noel Gerold,   Ms. Emily Wheeler is currently under my care for chronic combined systolic and diastolic CHF. The patient is currently well compensated with no episodes of CHF in several years. She has known minimal nonobstructive CAD with no known episodes of angina. She has no significant valvular abnormalities and she can achieve at least 6 METS. There is currently no contraindication for this patient to undergo scheduled spinal surgery. It is okay for her to stop taking aspirin 10 days prior to her surgery and restart as soon as acceptable from surgical standpoint. She is encouraged to continue taking carvedilol and atorvastatin.   Please call us with any questions,   Sincerely,   Tobias Alexander, MD  Cardiologist

## 2018-03-25 NOTE — Telephone Encounter (Signed)
Completed clearance note will be faxed to requesting Surgeons office.

## 2018-03-25 NOTE — Patient Instructions (Signed)
Medication Instructions:   Your physician recommends that you continue on your current medications as directed. Please refer to the Current Medication list given to you today.  If you need a refill on your cardiac medications before your next appointment, please call your pharmacy.     Testing/Procedures:  Your physician has requested that you have an echocardiogram. Echocardiography is a painless test that uses sound waves to create images of your heart. It provides your doctor with information about the size and shape of your heart and how well your heart's chambers and valves are working. This procedure takes approximately one hour. There are no restrictions for this procedure.     Follow-Up: At [facility], you and your health needs are our priority.  As part of our continuing mission to provide you with exceptional heart care, we have created designated Provider Care Teams.  These Care Teams include your primary Cardiologist (physician) and Advanced Practice Providers (APPs -  Physician Assistants and Nurse Practitioners) who all work together to provide you with the care you need, when you need it. You will need a follow up appointment in 6 months.  Please call our office 2 months in advance to schedule this appointment.  You may see Katarina Nelson, MD or one of the following Advanced Practice Providers on your designated Care Team:   Brittainy Simmons, PA-C Dayna Dunn, PA-C . Michele Lenze, PA-C    

## 2018-03-28 ENCOUNTER — Ambulatory Visit: Payer: Medicare Other | Admitting: Cardiology

## 2018-03-31 ENCOUNTER — Other Ambulatory Visit: Payer: Self-pay | Admitting: Internal Medicine

## 2018-03-31 DIAGNOSIS — M069 Rheumatoid arthritis, unspecified: Secondary | ICD-10-CM

## 2018-04-02 ENCOUNTER — Ambulatory Visit (HOSPITAL_COMMUNITY): Payer: Medicare Other | Attending: Cardiology

## 2018-04-02 ENCOUNTER — Other Ambulatory Visit: Payer: Self-pay

## 2018-04-02 DIAGNOSIS — I2510 Atherosclerotic heart disease of native coronary artery without angina pectoris: Secondary | ICD-10-CM | POA: Diagnosis not present

## 2018-04-02 DIAGNOSIS — I5042 Chronic combined systolic (congestive) and diastolic (congestive) heart failure: Secondary | ICD-10-CM | POA: Diagnosis not present

## 2018-04-02 DIAGNOSIS — I428 Other cardiomyopathies: Secondary | ICD-10-CM | POA: Diagnosis not present

## 2018-04-02 DIAGNOSIS — E782 Mixed hyperlipidemia: Secondary | ICD-10-CM | POA: Insufficient documentation

## 2018-04-02 DIAGNOSIS — Z01810 Encounter for preprocedural cardiovascular examination: Secondary | ICD-10-CM | POA: Diagnosis not present

## 2018-04-03 ENCOUNTER — Telehealth: Payer: Self-pay | Admitting: *Deleted

## 2018-04-03 DIAGNOSIS — R9430 Abnormal result of cardiovascular function study, unspecified: Secondary | ICD-10-CM

## 2018-04-03 DIAGNOSIS — I428 Other cardiomyopathies: Secondary | ICD-10-CM

## 2018-04-03 DIAGNOSIS — I5042 Chronic combined systolic (congestive) and diastolic (congestive) heart failure: Secondary | ICD-10-CM

## 2018-04-03 MED ORDER — SACUBITRIL-VALSARTAN 49-51 MG PO TABS: 1.0000 | ORAL_TABLET | Freq: Two times a day (BID) | ORAL | 0 refills | Status: DC

## 2018-04-03 NOTE — Telephone Encounter (Signed)
Spoke with the pt and endorsed her echo results and recommendations per Dr Delton See.  Pt aware that she will need to STOP her losartan, and then the next day start Entresto 49/51 mg po bid (confirmed with our Pharmacist about losartan washout, and this does not occur with losartan and starting Entresto).  Per Prudence Davidson PharmD, pt should take last dose of losartan, then the next day, she can start taking her Entresto in correspondence with the same time she takes her daily losartan.  Also per Nicholaus Bloom, pt will need a BMET done same day as she comes in to see them in BP clinic, for [facility] start and possible titration.  Confirmed the pharmacy of choice with the pt.  Also informed the pt that I will leave her 2 weeks worth of Entresto samples at the front desk for her to pick-up. Also informed her that a co-pay card will be in there too.  Informed the pt that I will have one of our [facility] schedulers call her back to arrange her BP clinic appt and bmet same day, for 3 weeks out.  Pt verbalized understanding and agrees with this plan.  Also confirmed with our Pharmacist that the pt should continue taking her aldactone and KDUR, along with Sherryll Burger, and per Nicholaus Bloom, she should continue those meds, with the [facility].

## 2018-04-03 NOTE — Telephone Encounter (Signed)
-----   Message from Lars Masson, MD sent at 04/03/2018  1:54 PM EST ----- Thank you!! Lajoyce Corners, can you call the patient, stop Losartan, start Entresto 51/49 mg BID after 36 hours of taking last losartan dose?  Also if she can follow up with pharmacy after 3 weeks of taking it for BP control and dose adjustment if needed? Thank you, KN   ----- Message ----- From: Levin Bacon, [facility] Sent: 04/02/2018   2:26 PM EST To: Lars Masson, MD, Loa Socks, LPN, #  Dr. Delton See, We called her insurance and they report that her copay will be $8.50 for a 1 month supply. They could not tell us the cost when or/if she is in the donut hole. Please let me know if we can assist any further on this patient.   Thanks, Nicholaus Bloom    ----- Message ----- From: Lars Masson, MD Sent: 04/02/2018  12:46 PM EST To: Loa Socks, LPN, Levin Bacon, RPH, #  LVEF remains 35-40%. She would qualify for [facility], in the past cost was a concern. Aundra Millet and Nicholaus Bloom do you know how much it would cost her? Thank you!!

## 2018-04-07 ENCOUNTER — Telehealth: Payer: Self-pay | Admitting: Cardiology

## 2018-04-07 NOTE — Telephone Encounter (Signed)
RE: NEEDS BP CLINIC APPT FOR ENTRESTO START IN 3 WEEKS BMET SAME DAY  Received: Today  Message Contents  Janett Billow, LPN        She is schedule for 04-22-18    Pt made aware of appt date and time by [facility] scheduler.

## 2018-04-07 NOTE — Telephone Encounter (Signed)
Scheduler Omar Person called and left the pt a message to call back to arrange needed appts. Jasmine December will call her now and attempt to schedule appts. Operators, if the pt calls back, please send this call to Omar Person in [facility] Scheduling, for she is trying to get in touch with this pt. Thanks!

## 2018-04-07 NOTE — Telephone Encounter (Signed)
New Message:   Patient calling some called her back and she missed the call. Please call patient.

## 2018-04-10 ENCOUNTER — Other Ambulatory Visit: Payer: Self-pay | Admitting: Internal Medicine

## 2018-04-10 DIAGNOSIS — I10 Essential (primary) hypertension: Secondary | ICD-10-CM

## 2018-04-18 DIAGNOSIS — M545 Low back pain: Secondary | ICD-10-CM | POA: Diagnosis not present

## 2018-04-18 DIAGNOSIS — M48062 Spinal stenosis, lumbar region with neurogenic claudication: Secondary | ICD-10-CM | POA: Diagnosis not present

## 2018-04-22 ENCOUNTER — Other Ambulatory Visit: Payer: Medicare Other | Admitting: *Deleted

## 2018-04-22 ENCOUNTER — Other Ambulatory Visit: Payer: Self-pay | Admitting: Cardiology

## 2018-04-22 ENCOUNTER — Telehealth: Payer: Self-pay | Admitting: *Deleted

## 2018-04-22 ENCOUNTER — Ambulatory Visit (INDEPENDENT_AMBULATORY_CARE_PROVIDER_SITE_OTHER): Payer: Medicare Other | Admitting: Pharmacist

## 2018-04-22 VITALS — BP 128/82 | HR 66

## 2018-04-22 DIAGNOSIS — R9430 Abnormal result of cardiovascular function study, unspecified: Secondary | ICD-10-CM

## 2018-04-22 DIAGNOSIS — I10 Essential (primary) hypertension: Secondary | ICD-10-CM

## 2018-04-22 DIAGNOSIS — I428 Other cardiomyopathies: Secondary | ICD-10-CM | POA: Diagnosis not present

## 2018-04-22 DIAGNOSIS — I5042 Chronic combined systolic (congestive) and diastolic (congestive) heart failure: Secondary | ICD-10-CM

## 2018-04-22 LAB — BASIC METABOLIC PANEL
BUN/Creatinine Ratio: 16 (ref 12–28)
BUN: 19 mg/dL (ref 8–27)
CO2: 23 mmol/L (ref 20–29)
Calcium: 9.5 mg/dL (ref 8.7–10.3)
Chloride: 104 mmol/L (ref 96–106)
Creatinine, Ser: 1.17 mg/dL — ABNORMAL HIGH (ref 0.57–1.00)
GFR calc Af Amer: 57 mL/min/{1.73_m2} — ABNORMAL LOW (ref >59)
GFR calc non Af Amer: 49 mL/min/{1.73_m2} — ABNORMAL LOW (ref >59)
Glucose: 115 mg/dL — ABNORMAL HIGH (ref 65–99)
Potassium: 4.8 mmol/L (ref 3.5–5.2)
Sodium: 142 mmol/L (ref 134–144)

## 2018-04-22 NOTE — Telephone Encounter (Signed)
Notified the pt that per Dr Delton See, her bmet showed normal K and creatinine, and she recommends that she continue her current regimen.  Pt verbalized understanding and agrees with this plan.

## 2018-04-22 NOTE — Telephone Encounter (Signed)
-----   Message from Lars Masson, MD sent at 04/22/2018  6:12 PM EST ----- Normal K and crea, continue the same dose  ----- Message ----- From: Loa Socks, LPN Sent: [id number]/29/9242   6:11 PM EST To: Lars Masson, MD  New entresto start.  Please result!  Thanks

## 2018-04-22 NOTE — Patient Instructions (Addendum)
If your labs are stable today, we will discontinue your hydrochlorothiazide (HCTZ and potassium supplement, and increase your Entresto to 97-103mg  twice daily  Continue taking your carvedilol and spironolactone  Recheck lab work on Monday 12/2  Monitor your blood pressure at home - your goal is < 130/73mmHg  Call Lenn Volker to schedule a follow up blood pressure appointment in December #[phone]

## 2018-04-22 NOTE — Progress Notes (Signed)
Patient ID: Emily Wheeler                 DOB: 12-Jul-1955                      MRN: [id number]     HPI: Emily Wheeler is a 62 y.o. female referred by Dr. Meda Coffee to pharmacy clinic . PMH is significant for NICMP diagnosed in 2016, LVEF 35-40% on 04/02/18 echo, mild CAD, HTN, HLD, DM, and RA. She was started on Entresto 2 weeks ago and presents today for follow up.  Pt presents today in good spirits. She reports feeling better on Entresto and reports improvement in breathing. BP readings have improved as well - home reading of 118/67, HR 58. She has not taken her medications yet today (usually takes them around 10am). States she has been using OTC K supplementation rather than prescribed. (has K Dur 29mq BID on med list). Exercise is limited - she is pending back surgery on 12/3 and will be recovering the rest of the month. She takes her spironolactone in the evening since she takes her HCTZ in the morning. Goes to the bathroom about twice during the night.   Current HTN meds: carvedilol 6.213mBID, Entresto 49-5172mID, spironolactone 82m61mily, HCTZ 82mg66mly Previously tried: lisinopril - cough BP goal: <130/80mmH22mamily History: Father with history of HTN and heart disease. Sister with HTN, brother with  HTN and DM.  Social History: Former smoker, rare alcohol use; marijuana use.  Diet: Brunch - pancake, egg, sausage biscuit or toast.  Dinner - hamburger, baked beans and fries. Does not go out to fast food. Does add some salt to food. Drinks 1 cup of coffee each morning and flavored water.  Exercise: Limited - back surgery on 12/3.  Wt Readings from Last 3 Encounters:  03/25/18 210 lb 12.8 oz (95.6 kg)  11/26/17 202 lb 4 oz (91.7 kg)  09/19/17 204 lb 12 oz (92.9 kg)   BP Readings from Last 3 Encounters:  03/25/18 138/82  11/26/17 130/80  09/19/17 120/66   Pulse Readings from Last 3 Encounters:  03/25/18 61  11/26/17 70  09/19/17 73    Renal function: CrCl cannot be  calculated (Patient's most recent lab result is older than the maximum 21 days allowed.).  Past Medical History:  Diagnosis Date  . Anemia    mild  . Anxiety   . Arthritis   . Asthma   . Cardiomyopathy (HCC)  [place]F (congestive heart failure) (HCC)  [place]abetes mellitus without complication (HCC)  [place] of urination    at night  . GERD (gastroesophageal reflux disease)    potassium worsens reflux  . Hemorrhoids, external   . Hyperlipidemia   . Hypertension   . Neuromuscular disorder (HCC)    neuropathy  . Numbness and tingling of both legs   . Rheumatoid arthritis (HCC)  [place]    hx of    Current Outpatient Medications on File Prior to Visit  Medication Sig Dispense Refill  . Albuterol Sulfate (PROAIR RESPICLICK) 108 (9948ase) MCG/ACT AEPB Inhale 2 puffs into the lungs 4 (four) times daily as needed. 1 each 11  . allopurinol (ZYLOPRIM) 100 MG tablet TAKE 2 TABLETS BY MOUTH ONCE DAILY 180 tablet 1  . aspirin EC 81 MG tablet Take 1 tablet (81 mg total) by mouth daily. 90 tablet 3  . atorvastatin (LIPITOR) 40 MG tablet Take  1 tablet (40 mg total) by mouth daily. 90 tablet 1  . Blood Glucose Monitoring Suppl (ACCU-CHEK GUIDE) w/Device KIT 1 Act by Does not apply route 3 (three) times daily. 2 kit 0  . Calcium Carb-Cholecalciferol (CALCIUM 600 + D PO) Take 1 tablet by mouth daily.    . carvedilol (COREG) 6.25 MG tablet TAKE 1 TABLET BY MOUTH TWICE DAILY WITH A MEAL 180 tablet 0  . citalopram (CELEXA) 20 MG tablet Take 1 tablet (20 mg total) by mouth daily. 90 tablet 1  . cyanocobalamin 2000 MCG tablet Take 1 tablet (2,000 mcg total) by mouth daily. 90 tablet 1  . DEXILANT 60 MG capsule TAKE 1 CAPSULE BY MOUTH ONCE DAILY 90 capsule 1  . folic acid (FOLVITE) 1 MG tablet TAKE TWO (2) TABLETS (2 MG TOTAL)  BY MOUTH DAILY    . gabapentin (NEURONTIN) 600 MG tablet TAKE 1 TABLET BY MOUTH THREE TIMES DAILY 270 tablet 1  . glucose blood (ACCU-CHEK GUIDE) test strip Use TID 100  each 12  . hydrochlorothiazide (HYDRODIURIL) 25 MG tablet Take 1 tablet (25 mg total) by mouth daily. 90 tablet 1  . leflunomide (ARAVA) 20 MG tablet TAKE 1 TABLET BY MOUTH ONCE DAILY 90 tablet 0  . metFORMIN (GLUCOPHAGE) 500 MG tablet Take 1 tablet (500 mg total) by mouth 2 (two) times daily with a meal. 180 tablet 1  . Omega-3 Fatty Acids (FISH OIL) 1200 MG CAPS Take 1,200 mg by mouth daily.     . potassium chloride SA (K-DUR,KLOR-CON) 20 MEQ tablet Take 1 tablet (20 mEq total) by mouth 2 (two) times daily. 60 tablet 5  . sacubitril-valsartan (ENTRESTO) 49-51 MG Take 1 tablet by mouth 2 (two) times daily. 60 tablet 0  . spironolactone (ALDACTONE) 25 MG tablet Take 1 tablet (25 mg total) by mouth daily. Please keep upcoming appt in November for future refills. Thank you 90 tablet 0  . vitamin C (ASCORBIC ACID) 500 MG tablet Take 500 mg by mouth daily as needed (for cold symptoms).     No current facility-administered medications on file prior to visit.     Allergies  Allergen Reactions  . Lisinopril Cough     Assessment/Plan:  1. HF medication optimization - BP is well controlled at goal <130/79mHg. Will check BMET today on Entresto and if stable, will plan to d/c HCTZ and K supplement and increase Entresto to 97-1072mBID to further optimize HF medications. Continue carvedilol 6.252mID (HR upper 50s to low 60s at home) and spironolactone 22m49mily. Will move spironolactone dosing to AM to decrease nighttime urination. Repeat BMET in 1 week. Pt will call clinic once she recovers from back surgery next week to schedule follow up BP check.  Megan E. Supple, PharmD, BCACP, CPP [id number] [id number]: [phone]: ([phone]/2019 9:42 AM

## 2018-04-23 ENCOUNTER — Telehealth: Payer: Self-pay | Admitting: Pharmacist

## 2018-04-23 DIAGNOSIS — Z01812 Encounter for preprocedural laboratory examination: Secondary | ICD-10-CM | POA: Diagnosis not present

## 2018-04-23 DIAGNOSIS — M4310 Spondylolisthesis, site unspecified: Secondary | ICD-10-CM | POA: Diagnosis not present

## 2018-04-23 DIAGNOSIS — M40209 Unspecified kyphosis, site unspecified: Secondary | ICD-10-CM | POA: Diagnosis not present

## 2018-04-23 DIAGNOSIS — I5042 Chronic combined systolic (congestive) and diastolic (congestive) heart failure: Secondary | ICD-10-CM

## 2018-04-23 DIAGNOSIS — I428 Other cardiomyopathies: Secondary | ICD-10-CM

## 2018-04-23 DIAGNOSIS — M4800 Spinal stenosis, site unspecified: Secondary | ICD-10-CM | POA: Diagnosis not present

## 2018-04-23 DIAGNOSIS — R9430 Abnormal result of cardiovascular function study, unspecified: Secondary | ICD-10-CM

## 2018-04-23 MED ORDER — SACUBITRIL-VALSARTAN 97-103 MG PO TABS: 1.0000 | ORAL_TABLET | Freq: Two times a day (BID) | ORAL | 11 refills | Status: DC

## 2018-04-23 NOTE — Telephone Encounter (Signed)
BMET stable yesterday on Entresto. Will d/c HCTZ and K supplement and increase Entresto to 97-103mg  BID to further optimize HF medications as discussed at OV yesterday. Pt is aware and will come in for repeat BMET in 1 week.

## 2018-04-28 ENCOUNTER — Other Ambulatory Visit: Payer: Medicare Other | Admitting: *Deleted

## 2018-04-28 DIAGNOSIS — I10 Essential (primary) hypertension: Secondary | ICD-10-CM

## 2018-04-28 DIAGNOSIS — R9430 Abnormal result of cardiovascular function study, unspecified: Secondary | ICD-10-CM | POA: Diagnosis not present

## 2018-04-29 DIAGNOSIS — M4316 Spondylolisthesis, lumbar region: Secondary | ICD-10-CM | POA: Diagnosis not present

## 2018-04-29 DIAGNOSIS — G9619 Other disorders of meninges, not elsewhere classified: Secondary | ICD-10-CM | POA: Diagnosis not present

## 2018-04-29 DIAGNOSIS — I2510 Atherosclerotic heart disease of native coronary artery without angina pectoris: Secondary | ICD-10-CM | POA: Diagnosis not present

## 2018-04-29 DIAGNOSIS — J45909 Unspecified asthma, uncomplicated: Secondary | ICD-10-CM | POA: Diagnosis not present

## 2018-04-29 DIAGNOSIS — M4030 Flatback syndrome, site unspecified: Secondary | ICD-10-CM | POA: Diagnosis not present

## 2018-04-29 DIAGNOSIS — Z981 Arthrodesis status: Secondary | ICD-10-CM | POA: Diagnosis not present

## 2018-04-29 DIAGNOSIS — M961 Postlaminectomy syndrome, not elsewhere classified: Secondary | ICD-10-CM | POA: Diagnosis not present

## 2018-04-29 DIAGNOSIS — M5416 Radiculopathy, lumbar region: Secondary | ICD-10-CM | POA: Diagnosis not present

## 2018-04-29 DIAGNOSIS — Z7409 Other reduced mobility: Secondary | ICD-10-CM | POA: Diagnosis not present

## 2018-04-29 DIAGNOSIS — R200 Anesthesia of skin: Secondary | ICD-10-CM | POA: Diagnosis not present

## 2018-04-29 DIAGNOSIS — M48062 Spinal stenosis, lumbar region with neurogenic claudication: Secondary | ICD-10-CM | POA: Diagnosis not present

## 2018-04-29 DIAGNOSIS — Z885 Allergy status to narcotic agent status: Secondary | ICD-10-CM | POA: Diagnosis not present

## 2018-04-29 DIAGNOSIS — Z7982 Long term (current) use of aspirin: Secondary | ICD-10-CM | POA: Diagnosis not present

## 2018-04-29 DIAGNOSIS — M4326 Fusion of spine, lumbar region: Secondary | ICD-10-CM | POA: Diagnosis not present

## 2018-04-29 DIAGNOSIS — Z7984 Long term (current) use of oral hypoglycemic drugs: Secondary | ICD-10-CM | POA: Diagnosis not present

## 2018-04-29 DIAGNOSIS — Z87891 Personal history of nicotine dependence: Secondary | ICD-10-CM | POA: Diagnosis not present

## 2018-04-29 DIAGNOSIS — M545 Low back pain: Secondary | ICD-10-CM | POA: Diagnosis not present

## 2018-04-29 DIAGNOSIS — E119 Type 2 diabetes mellitus without complications: Secondary | ICD-10-CM | POA: Diagnosis not present

## 2018-04-29 DIAGNOSIS — M4327 Fusion of spine, lumbosacral region: Secondary | ICD-10-CM | POA: Diagnosis not present

## 2018-04-29 DIAGNOSIS — Z888 Allergy status to other drugs, medicaments and biological substances status: Secondary | ICD-10-CM | POA: Diagnosis not present

## 2018-04-29 DIAGNOSIS — M4010 Other secondary kyphosis, site unspecified: Secondary | ICD-10-CM | POA: Diagnosis not present

## 2018-04-29 DIAGNOSIS — M109 Gout, unspecified: Secondary | ICD-10-CM | POA: Diagnosis not present

## 2018-04-29 DIAGNOSIS — Z79899 Other long term (current) drug therapy: Secondary | ICD-10-CM | POA: Diagnosis not present

## 2018-04-29 DIAGNOSIS — I110 Hypertensive heart disease with heart failure: Secondary | ICD-10-CM | POA: Diagnosis not present

## 2018-04-29 LAB — BASIC METABOLIC PANEL
BUN/Creatinine Ratio: 15 (ref 12–28)
BUN: 17 mg/dL (ref 8–27)
CO2: 20 mmol/L (ref 20–29)
Calcium: 9 mg/dL (ref 8.7–10.3)
Chloride: 107 mmol/L — ABNORMAL HIGH (ref 96–106)
Creatinine, Ser: 1.12 mg/dL — ABNORMAL HIGH (ref 0.57–1.00)
GFR calc Af Amer: 60 mL/min/{1.73_m2} (ref >59)
GFR calc non Af Amer: 52 mL/min/{1.73_m2} — ABNORMAL LOW (ref >59)
Glucose: 113 mg/dL — ABNORMAL HIGH (ref 65–99)
Potassium: 4.7 mmol/L (ref 3.5–5.2)
Sodium: 142 mmol/L (ref 134–144)

## 2018-04-30 ENCOUNTER — Telehealth: Payer: Self-pay

## 2018-04-30 NOTE — Telephone Encounter (Signed)
Follow Up: ° ° ° °Returning your call from this morning, concerning her lab results. °

## 2018-04-30 NOTE — Telephone Encounter (Signed)
-----   Message from Katarina H Nelson, MD sent at 04/29/2018  1:07 PM EST ----- Stable BMP 

## 2018-04-30 NOTE — Telephone Encounter (Signed)
Notes recorded by Sigurd Sos, RN on 04/30/2018 at 1:16 PM EST The patient has been notified of the result and verbalized understanding. All questions (if any) were answered. Sigurd Sos, RN 04/30/2018 1:16 PM   ------

## 2018-04-30 NOTE — Telephone Encounter (Signed)
Notes recorded by Sigurd Sos, RN on 04/30/2018 at 8:50 AM EST [id number] 12/4 ------

## 2018-04-30 NOTE — Telephone Encounter (Signed)
-----   Message from Lars Masson, MD sent at 04/29/2018  1:07 PM EST ----- Stable BMP

## 2018-05-05 ENCOUNTER — Telehealth: Payer: Self-pay | Admitting: *Deleted

## 2018-05-05 NOTE — Telephone Encounter (Signed)
Called pt she states she had surgery done of her back will be following up with Dr. Noel Gerold instead of dr. Yetta Barre.Marland KitchenRaechel Chute

## 2018-05-05 NOTE — Telephone Encounter (Signed)
Pt was on Patient Emily Wheeler has been discharge form [facility] Will contact pt to make sure hosp f/u appt has been set-up.Marland KitchenRaechel Chute  [facility]- [place], [place]  Recent Inpatient Stay 04/29/18 - 05/01/18 (2 days)  Diagnoses Other reduced mobility [phone]); Low back pain (M545); Other specified health status [phone])  Provider Max Noel Gerold

## 2018-05-30 DIAGNOSIS — M961 Postlaminectomy syndrome, not elsewhere classified: Secondary | ICD-10-CM | POA: Diagnosis not present

## 2018-05-30 DIAGNOSIS — Z981 Arthrodesis status: Secondary | ICD-10-CM | POA: Diagnosis not present

## 2018-05-30 DIAGNOSIS — M545 Low back pain: Secondary | ICD-10-CM | POA: Diagnosis not present

## 2018-05-30 DIAGNOSIS — M4316 Spondylolisthesis, lumbar region: Secondary | ICD-10-CM | POA: Diagnosis not present

## 2018-05-31 ENCOUNTER — Other Ambulatory Visit: Payer: Self-pay | Admitting: Internal Medicine

## 2018-05-31 DIAGNOSIS — M48061 Spinal stenosis, lumbar region without neurogenic claudication: Secondary | ICD-10-CM

## 2018-06-11 ENCOUNTER — Other Ambulatory Visit: Payer: Self-pay | Admitting: Internal Medicine

## 2018-06-11 DIAGNOSIS — E118 Type 2 diabetes mellitus with unspecified complications: Secondary | ICD-10-CM

## 2018-06-14 ENCOUNTER — Other Ambulatory Visit: Payer: Self-pay | Admitting: Internal Medicine

## 2018-06-14 DIAGNOSIS — E118 Type 2 diabetes mellitus with unspecified complications: Secondary | ICD-10-CM

## 2018-06-15 ENCOUNTER — Telehealth: Payer: Self-pay | Admitting: Internal Medicine

## 2018-06-15 DIAGNOSIS — E118 Type 2 diabetes mellitus with unspecified complications: Secondary | ICD-10-CM

## 2018-06-16 MED ORDER — METFORMIN HCL 500 MG PO TABS: 500.0000 mg | ORAL_TABLET | Freq: Two times a day (BID) | ORAL | 0 refills | Status: DC

## 2018-06-16 NOTE — Telephone Encounter (Signed)
Pt scheduled for 2/10 at 3:30pm. She would like to see if a partial refill can be sent in to last until that appt as she is out of medication.

## 2018-06-16 NOTE — Addendum Note (Signed)
Addended by: Radford Pax M on: 06/16/2018 11:45 AM   Modules accepted: Orders

## 2018-06-16 NOTE — Telephone Encounter (Signed)
30 day supply sent to local pharmacy on file.

## 2018-07-07 ENCOUNTER — Ambulatory Visit: Payer: Medicare Other | Admitting: Internal Medicine

## 2018-07-07 DIAGNOSIS — Z0289 Encounter for other administrative examinations: Secondary | ICD-10-CM

## 2018-07-16 ENCOUNTER — Other Ambulatory Visit (INDEPENDENT_AMBULATORY_CARE_PROVIDER_SITE_OTHER): Payer: Medicare Other

## 2018-07-16 ENCOUNTER — Other Ambulatory Visit: Payer: Self-pay | Admitting: Internal Medicine

## 2018-07-16 ENCOUNTER — Ambulatory Visit (INDEPENDENT_AMBULATORY_CARE_PROVIDER_SITE_OTHER): Payer: Medicare Other | Admitting: Internal Medicine

## 2018-07-16 ENCOUNTER — Encounter: Payer: Self-pay | Admitting: Internal Medicine

## 2018-07-16 VITALS — BP 124/80 | HR 78 | Temp 98.9°F | Resp 16 | Ht 65.0 in | Wt 212.0 lb

## 2018-07-16 DIAGNOSIS — D513 Other dietary vitamin B12 deficiency anemia: Secondary | ICD-10-CM

## 2018-07-16 DIAGNOSIS — D638 Anemia in other chronic diseases classified elsewhere: Secondary | ICD-10-CM

## 2018-07-16 DIAGNOSIS — E118 Type 2 diabetes mellitus with unspecified complications: Secondary | ICD-10-CM

## 2018-07-16 DIAGNOSIS — M069 Rheumatoid arthritis, unspecified: Secondary | ICD-10-CM

## 2018-07-16 DIAGNOSIS — I739 Peripheral vascular disease, unspecified: Secondary | ICD-10-CM

## 2018-07-16 DIAGNOSIS — E785 Hyperlipidemia, unspecified: Secondary | ICD-10-CM

## 2018-07-16 DIAGNOSIS — I10 Essential (primary) hypertension: Secondary | ICD-10-CM

## 2018-07-16 DIAGNOSIS — D510 Vitamin B12 deficiency anemia due to intrinsic factor deficiency: Secondary | ICD-10-CM

## 2018-07-16 LAB — URINALYSIS, ROUTINE W REFLEX MICROSCOPIC
Bilirubin Urine: NEGATIVE
Hgb urine dipstick: NEGATIVE
KETONES UR: NEGATIVE
LEUKOCYTE UA: NEGATIVE
Nitrite: NEGATIVE
RBC / HPF: NONE SEEN (ref 0–?)
SPECIFIC GRAVITY, URINE: 1.025 (ref 1.000–1.030)
Total Protein, Urine: NEGATIVE
Urine Glucose: NEGATIVE
Urobilinogen, UA: 0.2 (ref 0.0–1.0)
pH: 5.5 (ref 5.0–8.0)

## 2018-07-16 LAB — LIPID PANEL
CHOLESTEROL: 140 mg/dL (ref 0–200)
HDL: 44.2 mg/dL (ref >39.00)
LDL Cholesterol: 59 mg/dL (ref 0–99)
NONHDL: 96.26
TRIGLYCERIDES: 185 mg/dL — AB (ref 0.0–149.0)
Total CHOL/HDL Ratio: 3
VLDL: 37 mg/dL (ref 0.0–40.0)

## 2018-07-16 LAB — FERRITIN: Ferritin: 17.2 ng/mL (ref 10.0–291.0)

## 2018-07-16 LAB — CBC WITH DIFFERENTIAL/PLATELET
BASOS ABS: 0.1 10*3/uL (ref 0.0–0.1)
BASOS PCT: 1.3 % (ref 0.0–3.0)
EOS ABS: 0.2 10*3/uL (ref 0.0–0.7)
Eosinophils Relative: 5.1 % — ABNORMAL HIGH (ref 0.0–5.0)
HEMATOCRIT: 30.2 % — AB (ref 36.0–46.0)
Hemoglobin: 10 g/dL — ABNORMAL LOW (ref 12.0–15.0)
LYMPHS ABS: 2.3 10*3/uL (ref 0.7–4.0)
Lymphocytes Relative: 49.6 % — ABNORMAL HIGH (ref 12.0–46.0)
MCHC: 33.1 g/dL (ref 30.0–36.0)
MCV: 94.6 fl (ref 78.0–100.0)
MONOS PCT: 14.2 % — AB (ref 3.0–12.0)
Monocytes Absolute: 0.7 10*3/uL (ref 0.1–1.0)
NEUTROS ABS: 1.4 10*3/uL (ref 1.4–7.7)
NEUTROS PCT: 29.8 % — AB (ref 43.0–77.0)
PLATELETS: 284 10*3/uL (ref 150.0–400.0)
RBC: 3.19 Mil/uL — ABNORMAL LOW (ref 3.87–5.11)
RDW: 16.7 % — AB (ref 11.5–15.5)
WBC: 4.6 10*3/uL (ref 4.0–10.5)

## 2018-07-16 LAB — COMPREHENSIVE METABOLIC PANEL
ALT: 14 U/L (ref 0–35)
AST: 15 U/L (ref 0–37)
Albumin: 4.1 g/dL (ref 3.5–5.2)
Alkaline Phosphatase: 92 U/L (ref 39–117)
BILIRUBIN TOTAL: 0.3 mg/dL (ref 0.2–1.2)
BUN: 16 mg/dL (ref 6–23)
CALCIUM: 8.9 mg/dL (ref 8.4–10.5)
CHLORIDE: 106 meq/L (ref 96–112)
CO2: 26 meq/L (ref 19–32)
Creatinine, Ser: 1.06 mg/dL (ref 0.40–1.20)
GFR: 63.07 mL/min (ref >60.00)
GLUCOSE: 74 mg/dL (ref 70–99)
POTASSIUM: 4.1 meq/L (ref 3.5–5.1)
Sodium: 140 mEq/L (ref 135–145)
TOTAL PROTEIN: 6.9 g/dL (ref 6.0–8.3)

## 2018-07-16 LAB — POCT GLYCOSYLATED HEMOGLOBIN (HGB A1C): HEMOGLOBIN A1C: 5.9 % — AB (ref 4.0–5.6)

## 2018-07-16 LAB — VITAMIN B12: Vitamin B-12: 1348 pg/mL — ABNORMAL HIGH (ref 211–911)

## 2018-07-16 LAB — IBC PANEL
Iron: 61 ug/dL (ref 42–145)
SATURATION RATIOS: 17 % — AB (ref 20.0–50.0)
TRANSFERRIN: 257 mg/dL (ref 212.0–360.0)

## 2018-07-16 LAB — TSH: TSH: 1.46 u[IU]/mL (ref 0.35–4.50)

## 2018-07-16 LAB — MICROALBUMIN / CREATININE URINE RATIO
CREATININE, U: 140.1 mg/dL
MICROALB UR: 0.9 mg/dL (ref 0.0–1.9)
MICROALB/CREAT RATIO: 0.7 mg/g (ref 0.0–30.0)

## 2018-07-16 LAB — FOLATE

## 2018-07-16 NOTE — Patient Instructions (Signed)
Type 2 Diabetes Mellitus, Diagnosis, Adult Type 2 diabetes (type 2 diabetes mellitus) is a long-term (chronic) disease. In type 2 diabetes, one or both of these problems may be present:  The pancreas does not make enough of a hormone called insulin.  Cells in the body do not respond properly to insulin that the body makes (insulin resistance). Normally, insulin allows blood sugar (glucose) to enter cells in the body. The cells use glucose for energy. Insulin resistance or lack of insulin causes excess glucose to build up in the blood instead of going into cells. As a result, high blood glucose (hyperglycemia) develops. What increases the risk? The following factors may make you more likely to develop type 2 diabetes:  Having a family member with type 2 diabetes.  Being overweight or obese.  Having an inactive (sedentary) lifestyle.  Having been diagnosed with insulin resistance.  Having a history of prediabetes, gestational diabetes, or polycystic ovary syndrome (PCOS).  Being of American-Indian, African-American, Hispanic/Latino, or Asian/Pacific Islander descent. What are the signs or symptoms? In the early stage of this condition, you may not have symptoms. Symptoms develop slowly and may include:  Increased thirst (polydipsia).  Increased hunger(polyphagia).  Increased urination (polyuria).  Increased urination during the night (nocturia).  Unexplained weight loss.  Frequent infections that keep coming back (recurring).  Fatigue.  Weakness.  Vision changes, such as blurry vision.  Cuts or bruises that are slow to heal.  Tingling or numbness in the hands or feet.  Dark patches on the skin (acanthosis nigricans). How is this diagnosed? This condition is diagnosed based on your symptoms, your medical history, a physical exam, and your blood glucose level. Your blood glucose may be checked with one or more of the following blood tests:  A fasting blood glucose (FBG)  test. You will not be allowed to eat (you will fast) for 8 hours or longer before a blood sample is taken.  A random blood glucose test. This test checks blood glucose at any time of day regardless of when you ate.  An A1c (hemoglobin A1c) blood test. This test provides information about blood glucose control over the previous 2-3 months.  An oral glucose tolerance test (OGTT). This test measures your blood glucose at two times: ? After fasting. This is your baseline blood glucose level. ? Two hours after drinking a beverage that contains glucose. You may be diagnosed with type 2 diabetes if:  Your FBG level is 126 mg/dL (7.0 mmol/L) or higher.  Your random blood glucose level is 200 mg/dL (11.1 mmol/L) or higher.  Your A1c level is 6.5% or higher.  Your OGTT result is higher than 200 mg/dL (11.1 mmol/L). These blood tests may be repeated to confirm your diagnosis. How is this treated? Your treatment may be managed by a specialist called an endocrinologist. Type 2 diabetes may be treated by following instructions from your health care provider about:  Making diet and lifestyle changes. This may include: ? Following an individualized nutrition plan that is developed by a diet and nutrition specialist (registered dietitian). ? Exercising regularly. ? Finding ways to manage stress.  Checking your blood glucose level as often as told.  Taking diabetes medicines or insulin daily. This helps to keep your blood glucose levels in the healthy range. ? If you use insulin, you may need to adjust the dosage depending on how physically active you are and what foods you eat. Your health care provider will tell you how to adjust your dosage.    Taking medicines to help prevent complications from diabetes, such as: ? Aspirin. ? Medicine to lower cholesterol. ? Medicine to control blood pressure. Your health care provider will set individualized treatment goals for you. Your goals will be based on  your age, other medical conditions you have, and how you respond to diabetes treatment. Generally, the goal of treatment is to maintain the following blood glucose levels:  Before meals (preprandial): 80-130 mg/dL (4.4-7.2 mmol/L).  After meals (postprandial): below 180 mg/dL (10 mmol/L).  A1c level: less than 7%. Follow these instructions at home: Questions to ask your health care provider  Consider asking the following questions: ? Do I need to meet with a diabetes educator? ? Where can I find a support group for people with diabetes? ? What equipment will I need to manage my diabetes at home? ? What diabetes medicines do I need, and when should I take them? ? How often do I need to check my blood glucose? ? What number can I call if I have questions? ? When is my next appointment? General instructions  Take over-the-counter and prescription medicines only as told by your health care provider.  Keep all follow-up visits as told by your health care provider. This is important.  For more information about diabetes, visit: ? American Diabetes Association (ADA): www.diabetes.org ? American Association of Diabetes Educators (AADE): www.diabeteseducator.org Contact a health care provider if:  Your blood glucose is at or above 240 mg/dL (13.3 mmol/L) for 2 days in a row.  You have been sick or have had a fever for 2 days or longer, and you are not getting better.  You have any of the following problems for more than 6 hours: ? You cannot eat or drink. ? You have nausea and vomiting. ? You have diarrhea. Get help right away if:  Your blood glucose is lower than 54 mg/dL (3.0 mmol/L).  You become confused or you have trouble thinking clearly.  You have difficulty breathing.  You have moderate or large ketone levels in your urine. Summary  Type 2 diabetes (type 2 diabetes mellitus) is a long-term (chronic) disease. In type 2 diabetes, the pancreas does not make enough of a  hormone called insulin, or cells in the body do not respond properly to insulin that the body makes (insulin resistance).  This condition is treated by making diet and lifestyle changes and taking diabetes medicines or insulin.  Your health care provider will set individualized treatment goals for you. Your goals will be based on your age, other medical conditions you have, and how you respond to diabetes treatment.  Keep all follow-up visits as told by your health care provider. This is important. This information is not intended to replace advice given to you by your health care provider. Make sure you discuss any questions you have with your health care provider. Document Released: 05/14/2005 Document Revised: 12/13/2016 Document Reviewed: 06/17/2015 Elsevier Interactive Patient Education  2019 [employer]  

## 2018-07-16 NOTE — Progress Notes (Signed)
Subjective:  Patient ID: Emily Wheeler, female    DOB: 05-May-1956  Age: 63 y.o. MRN: [id number]  CC: Anemia; Hypertension; Hyperlipidemia; and Diabetes   HPI Emily Wheeler presents for f/up - She tells me her blood pressure and her blood sugar have been well controlled.  She denies any recent episodes of headache/blurred vision/chest pain/shortness of breath/edema/fatigue.  Outpatient Medications Prior to Visit  Medication Sig Dispense Refill  . Albuterol Sulfate (PROAIR RESPICLICK) 630 (90 Base) MCG/ACT AEPB Inhale 2 puffs into the lungs 4 (four) times daily as needed. 1 each 11  . allopurinol (ZYLOPRIM) 100 MG tablet TAKE 2 TABLETS BY MOUTH ONCE DAILY 180 tablet 1  . aspirin EC 81 MG tablet Take 1 tablet (81 mg total) by mouth daily. 90 tablet 3  . atorvastatin (LIPITOR) 40 MG tablet Take 1 tablet (40 mg total) by mouth daily. 90 tablet 1  . Blood Glucose Monitoring Suppl (ACCU-CHEK GUIDE) w/Device KIT 1 Act by Does not apply route 3 (three) times daily. 2 kit 0  . Calcium Carb-Cholecalciferol (CALCIUM 600 + D PO) Take 1 tablet by mouth daily.    . carvedilol (COREG) 6.25 MG tablet TAKE 1 TABLET BY MOUTH TWICE DAILY WITH A MEAL 180 tablet 0  . citalopram (CELEXA) 20 MG tablet Take 1 tablet (20 mg total) by mouth daily. 90 tablet 1  . cyanocobalamin 2000 MCG tablet Take 1 tablet (2,000 mcg total) by mouth daily. 90 tablet 1  . DEXILANT 60 MG capsule TAKE 1 CAPSULE BY MOUTH ONCE DAILY 90 capsule 1  . folic acid (FOLVITE) 1 MG tablet TAKE TWO (2) TABLETS (2 MG TOTAL)  BY MOUTH DAILY    . gabapentin (NEURONTIN) 600 MG tablet TAKE 1 TABLET BY MOUTH THREE TIMES DAILY 270 tablet 1  . glucose blood (ACCU-CHEK GUIDE) test strip Use TID 100 each 12  . leflunomide (ARAVA) 20 MG tablet TAKE 1 TABLET BY MOUTH ONCE DAILY 90 tablet 0  . metFORMIN (GLUCOPHAGE) 500 MG tablet Take 1 tablet (500 mg total) by mouth 2 (two) times daily with a meal. 60 tablet 0  . Omega-3 Fatty Acids (FISH OIL) 1200 MG  CAPS Take 1,200 mg by mouth daily.     . sacubitril-valsartan (ENTRESTO) 97-103 MG Take 1 tablet by mouth 2 (two) times daily. 60 tablet 11  . spironolactone (ALDACTONE) 25 MG tablet TAKE 1 TABLET BY MOUTH ONCE DAILY. PLEASE  KEEP  UPCOMING  APPOINTMENT  IN  NOVEMBER  FOR  FUTURE  REFILLS 90 tablet 3  . vitamin C (ASCORBIC ACID) 500 MG tablet Take 500 mg by mouth daily as needed (for cold symptoms).     No facility-administered medications prior to visit.     ROS Review of Systems  Constitutional: Negative.  Negative for diaphoresis, fatigue and unexpected weight change.  HENT: Negative.  Negative for sore throat and trouble swallowing.   Eyes: Negative for visual disturbance.  Respiratory: Negative for cough, chest tightness, shortness of breath and wheezing.   Cardiovascular: Negative for chest pain, palpitations and leg swelling.  Gastrointestinal: Negative for abdominal pain, blood in stool, constipation, diarrhea, nausea and vomiting.  Endocrine: Negative.   Genitourinary: Negative.  Negative for difficulty urinating, dysuria and hematuria.  Musculoskeletal: Negative.  Negative for arthralgias and neck pain.  Skin: Negative.  Negative for color change, pallor and rash.  Neurological: Positive for numbness. Negative for dizziness, weakness, light-headedness and headaches.       She has chronic numbness and tingling  in her lower extremities but the symptoms have improved since she had lumbar surgery  Hematological: Negative for adenopathy. Does not bruise/bleed easily.  Psychiatric/Behavioral: Negative.     Objective:  BP 124/80 (BP Location: Left Arm, Patient Position: Sitting, Cuff Size: Normal)   Pulse 78   Temp 98.9 F (37.2 C) (Oral)   Resp 16   Ht '5\' 5"'$  (1.651 m)   Wt 212 lb (96.2 kg)   LMP 05/29/1991   SpO2 98%   BMI 35.28 kg/m   BP Readings from Last 3 Encounters:  07/16/18 124/80  04/22/18 128/82  03/25/18 138/82    Wt Readings from Last 3 Encounters:    07/16/18 212 lb (96.2 kg)  03/25/18 210 lb 12.8 oz (95.6 kg)  11/26/17 202 lb 4 oz (91.7 kg)    Physical Exam Vitals signs reviewed.  Constitutional:      Appearance: She is obese. She is not ill-appearing or diaphoretic.  HENT:     Nose: Nose normal. No congestion or rhinorrhea.     Mouth/Throat:     Mouth: Mucous membranes are moist.     Pharynx: No oropharyngeal exudate or posterior oropharyngeal erythema.  Eyes:     General: No scleral icterus.    Conjunctiva/sclera: Conjunctivae normal.  Neck:     Musculoskeletal: Normal range of motion and neck supple. No neck rigidity or muscular tenderness.  Cardiovascular:     Rate and Rhythm: Normal rate and regular rhythm.     Pulses: Normal pulses.     Heart sounds: No murmur. No gallop.   Pulmonary:     Effort: Pulmonary effort is normal. No respiratory distress.     Breath sounds: No stridor. No wheezing, rhonchi or rales.  Abdominal:     General: Abdomen is flat. Bowel sounds are normal. There is no distension.     Palpations: There is no hepatomegaly, splenomegaly or mass.     Tenderness: There is no abdominal tenderness. There is no guarding.     Hernia: No hernia is present.  Musculoskeletal: Normal range of motion.        General: No swelling.     Right lower leg: No edema.     Left lower leg: No edema.  Lymphadenopathy:     Cervical: No cervical adenopathy.  Skin:    General: Skin is warm and dry.     Coloration: Skin is not jaundiced.     Findings: No rash.  Neurological:     General: No focal deficit present.     Mental Status: She is oriented to person, place, and time. Mental status is at baseline.     Lab Results  Component Value Date   WBC 4.6 07/16/2018   HGB 10.0 (L) 07/16/2018   HCT 30.2 (L) 07/16/2018   PLT 284.0 07/16/2018   GLUCOSE 74 07/16/2018   CHOL 140 07/16/2018   TRIG 185.0 (H) 07/16/2018   HDL 44.20 07/16/2018   LDLDIRECT 162.5 01/30/2013   LDLCALC 59 07/16/2018   ALT 14 07/16/2018    AST 15 07/16/2018   NA 140 07/16/2018   K 4.1 07/16/2018   CL 106 07/16/2018   CREATININE 1.06 07/16/2018   BUN 16 07/16/2018   CO2 26 07/16/2018   TSH 1.46 07/16/2018   INR 0.9 10/26/2014   HGBA1C 5.9 (A) 07/16/2018   MICROALBUR 0.9 07/16/2018    Mr Lumbar Spine Wo Contrast  Result Date: 02/22/2018 CLINICAL DATA:  Increased low back pain for 6 months. Bilateral leg pain  and hip weakness. EXAM: MRI LUMBAR SPINE WITHOUT CONTRAST TECHNIQUE: Multiplanar, multisequence MR imaging of the lumbar spine was performed. No intravenous contrast was administered. COMPARISON:  02/08/2013 FINDINGS: Segmentation:  Standard. Alignment: Stable minimal grade 1 anterolisthesis of L3 on L4. Alignment is otherwise anatomic. Vertebrae:  No fracture, evidence of discitis, or bone lesion. Conus medullaris and cauda equina: Conus extends to the L1 level. Conus and cauda equina appear normal. Paraspinal and other soft tissues: No acute paraspinal abnormality. Postsurgical changes in the posterior paraspinal soft tissues from L3 through S1. Disc levels: Disc spaces: Posterior lumbar interbody fusion from L3 through S1. Remainder the disc spaces are maintained. T12-L1: Minimal broad-based disc bulge. Mild bilateral facet arthropathy. No evidence of neural foraminal stenosis. No central canal stenosis. L1-L2: Broad-based disc bulge. Mild bilateral facet arthropathy. No evidence of neural foraminal stenosis. No central canal stenosis. L2-L3: Broad-based disc bulge flattening the ventral thecal sac. Severe bilateral facet arthropathy with ligamentum flavum infolding. Severe right lateral recess stenosis with possible impingement of the right intraspinal L3 nerve root. Moderate-severe spinal stenosis. Moderate right foraminal stenosis. Moderate left foraminal stenosis. L3-L4: Interbody fusion and posterior decompression. No evidence of neural foraminal stenosis. No central canal stenosis. L4-L5: Interbody fusion and decompression  with posterior osseous ridging. No evidence of neural foraminal stenosis. No central canal stenosis. L5-S1: Interbody fusion with posterior decompression. Mild bilateral foraminal stenosis. No central canal stenosis. IMPRESSION: 1. At L2-3 there is a broad-based disc bulge flattening the ventral thecal sac. Severe bilateral facet arthropathy with ligamentum flavum infolding. Severe right lateral recess stenosis with possible impingement of the right intraspinal L3 nerve root. Moderate-severe spinal stenosis. Moderate right foraminal stenosis. Moderate left foraminal stenosis. 2. Posterior lumbar interbody fusion and decompression from L3 through S1. Mild bilateral foraminal narrowing at L5-S1. Otherwise no significant foraminal or central canal stenosis. Electronically Signed   By: Kathreen Devoid   On: 02/22/2018 10:39    Assessment & Plan:   Caryssa was seen today for anemia, hypertension, hyperlipidemia and diabetes.  Diagnoses and all orders for this visit:  Type 2 diabetes mellitus with complication, without long-term current use of insulin ([place])- Her A1c is at 5.9%.  Her blood sugars are adequately well controlled. -     POCT glycosylated hemoglobin (Hb A1C) -     Comprehensive metabolic panel; Future -     Microalbumin / creatinine urine ratio; Future  Essential hypertension, benign- Her blood pressure is well controlled.  Electrolytes and renal function are normal. -     Comprehensive metabolic panel; Future -     Urinalysis, Routine w reflex microscopic; Future  PAD (peripheral artery disease) ([place])- She denies claudication.  We will continue to work on her risk factor modifications. -     Lipid panel; Future  Type II diabetes mellitus with manifestations ([place])  Other dietary vitamin B12 deficiency anemia -     CBC with Differential/Platelet; Future -     Vitamin B12; Future -     Folate; Future  Anemia of chronic disease- Her H&H remain low despite normal vitamin levels.  I have  asked her to see hematology to see if this needs to be investigated further. -     IBC panel; Future -     Ferritin; Future -     Ambulatory referral to Hematology  Hyperlipidemia with target LDL less than 100- She has achieved her LDL goal and is doing well on the statin. -     Lipid panel; Future -  TSH; Future  Vitamin B12 deficiency anemia due to intrinsic factor deficiency   I am having Emily Wheeler maintain her Fish Oil, aspirin EC, Calcium Carb-Cholecalciferol (CALCIUM 600 + D PO), vitamin C, ACCU-CHEK GUIDE, glucose blood, folic acid, Albuterol Sulfate, cyanocobalamin, DEXILANT, allopurinol, atorvastatin, citalopram, leflunomide, carvedilol, spironolactone, sacubitril-valsartan, gabapentin, and metFORMIN.  No orders of the defined types were placed in this encounter.    Follow-up: Return in about 6 months (around 01/14/2019).  Scarlette Calico, MD

## 2018-07-17 ENCOUNTER — Encounter: Payer: Self-pay | Admitting: Internal Medicine

## 2018-07-21 DIAGNOSIS — Z981 Arthrodesis status: Secondary | ICD-10-CM | POA: Diagnosis not present

## 2018-07-22 ENCOUNTER — Telehealth: Payer: Self-pay | Admitting: Hematology and Oncology

## 2018-07-22 NOTE — Telephone Encounter (Signed)
A new hem appt has ebeen scheduled for the pt to see Dr. Pamelia Hoit on 820am. Pt aware to arrive 30 minutes early.

## 2018-07-23 ENCOUNTER — Other Ambulatory Visit: Payer: Self-pay | Admitting: Internal Medicine

## 2018-07-23 DIAGNOSIS — I10 Essential (primary) hypertension: Secondary | ICD-10-CM

## 2018-07-23 DIAGNOSIS — E118 Type 2 diabetes mellitus with unspecified complications: Secondary | ICD-10-CM

## 2018-07-23 MED ORDER — CARVEDILOL 6.25 MG PO TABS: ORAL_TABLET | ORAL | 1 refills | Status: DC

## 2018-07-23 MED ORDER — METFORMIN HCL 500 MG PO TABS: 500.0000 mg | ORAL_TABLET | Freq: Two times a day (BID) | ORAL | 1 refills | Status: DC

## 2018-07-26 ENCOUNTER — Inpatient Hospital Stay: Payer: Medicare Other

## 2018-07-26 ENCOUNTER — Inpatient Hospital Stay: Payer: Medicare Other | Attending: Hematology and Oncology | Admitting: Hematology and Oncology

## 2018-07-26 ENCOUNTER — Telehealth: Payer: Self-pay | Admitting: Hematology and Oncology

## 2018-07-26 VITALS — BP 144/72 | HR 77 | Temp 98.2°F | Resp 18 | Ht 65.0 in | Wt 210.2 lb

## 2018-07-26 DIAGNOSIS — D513 Other dietary vitamin B12 deficiency anemia: Secondary | ICD-10-CM | POA: Diagnosis not present

## 2018-07-26 DIAGNOSIS — Z800 Family history of malignant neoplasm of digestive organs: Secondary | ICD-10-CM | POA: Diagnosis not present

## 2018-07-26 DIAGNOSIS — E538 Deficiency of other specified B group vitamins: Secondary | ICD-10-CM

## 2018-07-26 DIAGNOSIS — I519 Heart disease, unspecified: Secondary | ICD-10-CM

## 2018-07-26 DIAGNOSIS — R5382 Chronic fatigue, unspecified: Secondary | ICD-10-CM

## 2018-07-26 DIAGNOSIS — D519 Vitamin B12 deficiency anemia, unspecified: Secondary | ICD-10-CM | POA: Diagnosis not present

## 2018-07-26 DIAGNOSIS — Z87891 Personal history of nicotine dependence: Secondary | ICD-10-CM | POA: Diagnosis not present

## 2018-07-26 DIAGNOSIS — D638 Anemia in other chronic diseases classified elsewhere: Secondary | ICD-10-CM

## 2018-07-26 DIAGNOSIS — J45909 Unspecified asthma, uncomplicated: Secondary | ICD-10-CM | POA: Diagnosis not present

## 2018-07-26 DIAGNOSIS — Z807 Family history of other malignant neoplasms of lymphoid, hematopoietic and related tissues: Secondary | ICD-10-CM | POA: Diagnosis not present

## 2018-07-26 DIAGNOSIS — Z8371 Family history of colonic polyps: Secondary | ICD-10-CM | POA: Insufficient documentation

## 2018-07-26 LAB — CBC WITH DIFFERENTIAL (CANCER CENTER ONLY)
Abs Immature Granulocytes: 0 10*3/uL (ref 0.00–0.07)
Basophils Absolute: 0 10*3/uL (ref 0.0–0.1)
Basophils Relative: 1 %
Eosinophils Absolute: 0.2 10*3/uL (ref 0.0–0.5)
Eosinophils Relative: 6 %
HCT: 30.7 % — ABNORMAL LOW (ref 36.0–46.0)
Hemoglobin: 9.6 g/dL — ABNORMAL LOW (ref 12.0–15.0)
Immature Granulocytes: 0 %
Lymphocytes Relative: 46 %
Lymphs Abs: 1.8 10*3/uL (ref 0.7–4.0)
MCH: 29.9 pg (ref 26.0–34.0)
MCHC: 31.3 g/dL (ref 30.0–36.0)
MCV: 95.6 fL (ref 80.0–100.0)
MONO ABS: 0.5 10*3/uL (ref 0.1–1.0)
Monocytes Relative: 13 %
Neutro Abs: 1.3 10*3/uL — ABNORMAL LOW (ref 1.7–7.7)
Neutrophils Relative %: 34 %
Platelet Count: 274 10*3/uL (ref 150–400)
RBC: 3.21 MIL/uL — ABNORMAL LOW (ref 3.87–5.11)
RDW: 15.9 % — ABNORMAL HIGH (ref 11.5–15.5)
WBC: 3.9 10*3/uL — AB (ref 4.0–10.5)
nRBC: 0 % (ref 0.0–0.2)

## 2018-07-26 LAB — RETICULOCYTES
Immature Retic Fract: 16.7 % — ABNORMAL HIGH (ref 2.3–15.9)
RBC.: 3.21 MIL/uL — ABNORMAL LOW (ref 3.87–5.11)
RETIC CT PCT: 1.5 % (ref 0.4–3.1)
Retic Count, Absolute: 47.8 10*3/uL (ref 19.0–186.0)

## 2018-07-26 LAB — DIRECT ANTIGLOBULIN TEST (NOT AT ARMC)
DAT, IGG: NEGATIVE
DAT, complement: NEGATIVE

## 2018-07-26 LAB — LACTATE DEHYDROGENASE: LDH: 157 U/L (ref 98–192)

## 2018-07-26 NOTE — Progress Notes (Signed)
[facility] NOTE  Patient Care Team: [name], MD as PCP - General (Internal Medicine) Dorothy Spark, MD as PCP - Cardiology (Cardiology)  CHIEF COMPLAINTS/PURPOSE OF CONSULTATION:  Evaluation of anemia  HISTORY OF PRESENTING ILLNESS:  Emily Wheeler 63 y.o. female is here because of recent diagnosis of normocytic anemia.  Patient has a history of heart disease and the chronic B12 deficiency for which she is been on oral B12 supplementation with excellent results.  She was noted on recent blood work on 07/16/2018 to have a hemoglobin of 10 and was she was referred to [place] for further evaluation.  W23 folic acid and iron studies did not show any significant deficiencies.  She does feel chronic shortness of breath related to heart disease and asthma and chronic fatigue.  Denies any dizziness or lightheadedness.  She recently had a colonoscopy which was negative for bleeding.  She does not have any vaginal bleeding although she does have hemorrhoids.  These hemorrhoids do not seem to be bleeding significantly.  I reviewed her records extensively and collaborated the history with the patient.   MEDICAL HISTORY:  Past Medical History:  Diagnosis Date  . Anemia    mild  . Anxiety   . Arthritis   . Asthma   . Cardiomyopathy ([place])   . CHF (congestive heart failure) ([place])   . Diabetes mellitus without complication ([place])   . Frequency of urination    at night  . GERD (gastroesophageal reflux disease)    potassium worsens reflux  . Hemorrhoids, external   . Hyperlipidemia   . Hypertension   . Neuromuscular disorder ([facility])    neuropathy  . Numbness and tingling of both legs   . Rheumatoid arthritis ([place])   . Vertigo    hx of    SURGICAL HISTORY: Past Surgical History:  Procedure Laterality Date  . ABDOMINAL HYSTERECTOMY    . CARDIAC CATHETERIZATION N/A 10/28/2014   Procedure: Left Heart Cath and Coronary Angiography;  Surgeon: Jettie Booze, MD;   Location: [facility];  Service: Cardiovascular;  Laterality: N/A;  . CARPAL TUNNEL RELEASE Left   . COLONOSCOPY     pt states 11 yr ago in [place] had a colon with 2 polyps- one cecal polyp per pt. one ? location  . COLONOSCOPY W/ POLYPECTOMY    . CYST REMOVAL NECK    . HEMORROIDECTOMY    . MAXIMUM ACCESS (MAS)POSTERIOR LUMBAR INTERBODY FUSION (PLIF) 3 LEVEL N/A 10/08/2013   Procedure: FOR MAXIMUM ACCESS (MAS) POSTERIOR LUMBAR INTERBODY FUSION (PLIF) 3 LEVEL;  Surgeon: Erline Levine, MD;  Location: [facility];  Service: Neurosurgery;  Laterality: N/A;  L3-4 L4-5 L5-S1 maximum access posterior lumbar interbody fusion with decompression  . TUBAL LIGATION      SOCIAL HISTORY: Social History   Socioeconomic History  . Marital status: Single    Spouse name: Not on file  . Number of children: Not on file  . Years of education: Not on file  . Highest education level: Not on file  Occupational History  . Not on file  Social Needs  . Financial resource strain: Not on file  . Food insecurity:    Worry: Not on file    Inability: Not on file  . Transportation needs:    Medical: Not on file    Non-medical: Not on file  Tobacco Use  . Smoking status: Former Research scientist (life sciences)  . Smokeless tobacco: Never Used  Substance and Sexual  Activity  . Alcohol use: Yes    Comment: 3 x a year -rare   . Drug use: No    Types: Marijuana  . Sexual activity: Not Currently  Lifestyle  . Physical activity:    Days per week: Not on file    Minutes per session: Not on file  . Stress: Not on file  Relationships  . Social connections:    Talks on phone: Not on file    Gets together: Not on file    Attends religious service: Not on file    Active member of club or organization: Not on file    Attends meetings of clubs or organizations: Not on file    Relationship status: Not on file  . Intimate partner violence:    Fear of current or ex partner: Not on file    Emotionally abused: Not on file    Physically  abused: Not on file    Forced sexual activity: Not on file  Other Topics Concern  . Not on file  Social History Narrative  . Not on file    FAMILY HISTORY: Family History  Problem Relation Age of Onset  . Early death Father   . Heart disease Father   . Hypertension Sister   . Hypertension Brother   . Diabetes Brother   . Colon polyps Mother   . Hashimoto's thyroiditis Sister   . Colon cancer Maternal Grandmother        in her 68s  . Stomach cancer Maternal Aunt   . Non-Hodgkin's lymphoma Sister   . Alcohol abuse Neg Hx   . COPD Neg Hx   . Depression Neg Hx   . Drug abuse Neg Hx   . Hearing loss Neg Hx   . Hyperlipidemia Neg Hx   . Kidney disease Neg Hx   . Stroke Neg Hx   . Esophageal cancer Neg Hx   . Rectal cancer Neg Hx     ALLERGIES:  is allergic to lisinopril.  MEDICATIONS:  Current Outpatient Medications  Medication Sig Dispense Refill  . Albuterol Sulfate (PROAIR RESPICLICK) 867 (90 Base) MCG/ACT AEPB Inhale 2 puffs into the lungs 4 (four) times daily as needed. 1 each 11  . allopurinol (ZYLOPRIM) 100 MG tablet TAKE 2 TABLETS BY MOUTH ONCE DAILY 180 tablet 1  . aspirin EC 81 MG tablet Take 1 tablet (81 mg total) by mouth daily. 90 tablet 3  . atorvastatin (LIPITOR) 40 MG tablet Take 1 tablet (40 mg total) by mouth daily. 90 tablet 1  . Blood Glucose Monitoring Suppl (ACCU-CHEK GUIDE) w/Device KIT 1 Act by Does not apply route 3 (three) times daily. 2 kit 0  . Calcium Carb-Cholecalciferol (CALCIUM 600 + D PO) Take 1 tablet by mouth daily.    . carvedilol (COREG) 6.25 MG tablet TAKE 1 TABLET BY MOUTH TWICE DAILY WITH A MEAL 180 tablet 1  . citalopram (CELEXA) 20 MG tablet Take 1 tablet (20 mg total) by mouth daily. 90 tablet 1  . cyanocobalamin 2000 MCG tablet Take 1 tablet (2,000 mcg total) by mouth daily. 90 tablet 1  . DEXILANT 60 MG capsule TAKE 1 CAPSULE BY MOUTH ONCE DAILY 90 capsule 1  . folic acid (FOLVITE) 1 MG tablet TAKE TWO (2) TABLETS (2 MG TOTAL)   BY MOUTH DAILY    . gabapentin (NEURONTIN) 600 MG tablet TAKE 1 TABLET BY MOUTH THREE TIMES DAILY 270 tablet 1  . glucose blood (ACCU-CHEK GUIDE) test strip Use TID 100  each 12  . leflunomide (ARAVA) 20 MG tablet TAKE 1 TABLET BY MOUTH ONCE DAILY 90 tablet 0  . metFORMIN (GLUCOPHAGE) 500 MG tablet Take 1 tablet (500 mg total) by mouth 2 (two) times daily with a meal. 180 tablet 1  . Omega-3 Fatty Acids (FISH OIL) 1200 MG CAPS Take 1,200 mg by mouth daily.     . sacubitril-valsartan (ENTRESTO) 97-103 MG Take 1 tablet by mouth 2 (two) times daily. 60 tablet 11  . spironolactone (ALDACTONE) 25 MG tablet TAKE 1 TABLET BY MOUTH ONCE DAILY. PLEASE  KEEP  UPCOMING  APPOINTMENT  IN  NOVEMBER  FOR  FUTURE  REFILLS 90 tablet 3  . vitamin C (ASCORBIC ACID) 500 MG tablet Take 500 mg by mouth daily as needed (for cold symptoms).     No current facility-administered medications for this visit.     REVIEW OF SYSTEMS:   Constitutional: Denies fevers, chills or abnormal night sweats Eyes: Denies blurriness of vision, double vision or watery eyes Ears, nose, mouth, throat, and face: Denies mucositis or sore throat Respiratory: Denies cough, dyspnea or wheezes Cardiovascular: Denies palpitation, chest discomfort or lower extremity swelling Gastrointestinal:  Denies nausea, heartburn or change in bowel habits Skin: Denies abnormal skin rashes Lymphatics: Denies new lymphadenopathy or easy bruising Neurological:Denies numbness, tingling or new weaknesses Behavioral/Psych: Mood is stable, no new changes    All other systems were reviewed with the patient and are negative.  PHYSICAL EXAMINATION: ECOG PERFORMANCE STATUS: 1 - Symptomatic but completely ambulatory  Vitals:   07/26/18 0823  BP: (!) 144/72  Pulse: 77  Resp: 18  Temp: 98.2 F (36.8 C)  SpO2: 100%   Filed Weights   07/26/18 0823  Weight: 210 lb 3.2 oz (95.3 kg)    GENERAL:alert, no distress and comfortable SKIN: skin color, texture,  turgor are normal, no rashes or significant lesions EYES: normal, conjunctiva are pink and non-injected, sclera clear OROPHARYNX:no exudate, no erythema and lips, buccal mucosa, and tongue normal  NECK: supple, thyroid normal size, non-tender, without nodularity LYMPH:  no palpable lymphadenopathy in the cervical, axillary or inguinal LUNGS: clear to auscultation and percussion with normal breathing effort HEART: regular rate & rhythm and no murmurs and no lower extremity edema ABDOMEN:abdomen soft, non-tender and normal bowel sounds Musculoskeletal:no cyanosis of digits and no clubbing  PSYCH: alert & oriented x 3 with fluent speech NEURO: no focal motor/sensory deficits   LABORATORY DATA:  I have reviewed the data as listed Lab Results  Component Value Date   WBC 4.6 07/16/2018   HGB 10.0 (L) 07/16/2018   HCT 30.2 (L) 07/16/2018   MCV 94.6 07/16/2018   PLT 284.0 07/16/2018   Lab Results  Component Value Date   NA 140 07/16/2018   K 4.1 07/16/2018   CL 106 07/16/2018   CO2 26 07/16/2018    RADIOGRAPHIC STUDIES: I have personally reviewed the radiological reports and agreed with the findings in the report.  ASSESSMENT AND PLAN:  Anemia of chronic disease Normochromic normocytic anemia Lab review 07/16/2018:  Creatinine 6.20 Folic acid greater than 24 TSH 1.46 Iron studies: Iron saturation 17%, ferritin 17.2 Vitamin B12 1348: Patient on B12 supplementation Hemoglobin 10, MCV 94.6, RDW 16.7, WBC 4.6, ANC 1.4, ALC 2.3  The normocytic anemia appears to have been ongoing since the last 11 months.  Hemoglobin appears to have decreased from 13 to current level of 10.  I discussed with the patient the differential diagnosis of normocytic anemia with a prior  history of pernicious anemia with vitamin B12 deficiency due to intrinsic factor defect.  Recommendation: 1.  Serum protein electrophoresis 2.  Hemolysis work-up with haptoglobin, LDH, reticulocyte count 3. CBCD 4.   Erythropoietin level  If this work-up is negative then we can consider performing a bone marrow biopsy for further evaluation. Return to clinic in 1 week for follow-up.   All questions were answered. The patient knows to call the clinic with any problems, questions or concerns.    Harriette Ohara, MD 07/26/18

## 2018-07-26 NOTE — Assessment & Plan Note (Signed)
Normochromic normocytic anemia Lab review 07/16/2018:  Creatinine 1.12 Folic acid greater than 24 TSH 1.46 Iron studies: Iron saturation 17%, ferritin 17.2 Vitamin B12 1348: Patient on B12 supplementation Hemoglobin 10, MCV 94.6, RDW 16.7, WBC 4.6, ANC 1.4, ALC 2.3  The normocytic anemia appears to have been ongoing since the last 11 months.  Hemoglobin appears to have decreased from 13 to current level of 10.  I discussed with the patient the differential diagnosis of normocytic anemia with a prior history of pernicious anemia with vitamin B12 deficiency due to intrinsic factor defect.  Recommendation: 1.  Serum protein electrophoresis 2.  Hemolysis work-up with haptoglobin, LDH, reticulocyte count  If this work-up is negative then we can consider performing a bone marrow biopsy for further evaluation.   

## 2018-07-26 NOTE — Telephone Encounter (Signed)
Gave avs and calendar ° °

## 2018-07-27 LAB — HAPTOGLOBIN: Haptoglobin: 227 mg/dL (ref 37–355)

## 2018-07-28 LAB — ERYTHROPOIETIN: Erythropoietin: 30.7 m[IU]/mL — ABNORMAL HIGH (ref 2.6–18.5)

## 2018-07-29 LAB — MULTIPLE MYELOMA PANEL, SERUM
ALBUMIN SERPL ELPH-MCNC: 3.8 g/dL (ref 2.9–4.4)
ALPHA 1: 0.2 g/dL (ref 0.0–0.4)
Albumin/Glob SerPl: 1.4 (ref 0.7–1.7)
Alpha2 Glob SerPl Elph-Mcnc: 0.9 g/dL (ref 0.4–1.0)
B-Globulin SerPl Elph-Mcnc: 1 g/dL (ref 0.7–1.3)
Gamma Glob SerPl Elph-Mcnc: 0.7 g/dL (ref 0.4–1.8)
Globulin, Total: 2.8 g/dL (ref 2.2–3.9)
IgA: 174 mg/dL (ref 87–352)
IgG (Immunoglobin G), Serum: 917 mg/dL (ref 700–1600)
IgM (Immunoglobulin M), Srm: 73 mg/dL (ref 26–217)
Total Protein ELP: 6.6 g/dL (ref 6.0–8.5)

## 2018-07-31 DIAGNOSIS — M25552 Pain in left hip: Secondary | ICD-10-CM | POA: Diagnosis not present

## 2018-07-31 DIAGNOSIS — M4326 Fusion of spine, lumbar region: Secondary | ICD-10-CM | POA: Diagnosis not present

## 2018-08-05 ENCOUNTER — Other Ambulatory Visit: Payer: Self-pay | Admitting: Internal Medicine

## 2018-08-05 ENCOUNTER — Inpatient Hospital Stay: Payer: Medicare Other | Attending: Hematology and Oncology | Admitting: Hematology and Oncology

## 2018-08-05 ENCOUNTER — Other Ambulatory Visit: Payer: Self-pay | Admitting: Hematology and Oncology

## 2018-08-05 DIAGNOSIS — D638 Anemia in other chronic diseases classified elsewhere: Secondary | ICD-10-CM | POA: Diagnosis not present

## 2018-08-05 DIAGNOSIS — M069 Rheumatoid arthritis, unspecified: Secondary | ICD-10-CM

## 2018-08-05 DIAGNOSIS — E538 Deficiency of other specified B group vitamins: Secondary | ICD-10-CM | POA: Insufficient documentation

## 2018-08-05 NOTE — Assessment & Plan Note (Signed)
Lab review 07/26/2018: Hemoglobin 9.6, WBC 3.9, ANC 1.3, MCV 95.6, platelet 274 Reticulocytes: 1.5%, absolute reticulocyte 47.8, immature reticulocyte fraction 16.7% which is elevated LDH 157, DAT negative, haptoglobin 227 Erythropoietin 30.7 SPEP: No M protein  The above results ruled out any evidence of hemolysis, no evidence of monoclonal protein Previous work-up revealed normal iron studies and J00 and folic acid and thyroid function, creatinine 1.12 Since this work-up is not conclusive for any hematological disorder, I recommended a bone marrow biopsy.  This will also evaluate the cause of slightly lower neutrophil counts.

## 2018-08-05 NOTE — Progress Notes (Signed)
Patient Care Team: [name], MD as PCP - General (Internal Medicine) Dorothy Spark, MD as PCP - Cardiology (Cardiology)  DIAGNOSIS:    ICD-10-CM   1. Anemia of chronic disease D63.8 CT BIOPSY    CHIEF COMPLIANT: Follow-up of anemia  INTERVAL HISTORY: Emily Wheeler is a 63 y.o. with above-mentioned history of anemia and B12 deficiency. She presents to the clinic today with her sister and niece. Her most recent labs from 07/26/18 show: WBC 3.9, Hg 9.6, platelets 274, ANC 1.3, erythropoietin 30.7, LDH 157, haptoglobin 227, immature retic fraction 16.7, DAT negative, M protein negative. She is hesitant to get a bone marrow biopsy due to the pain.   REVIEW OF SYSTEMS:   Constitutional: Denies fevers, chills or abnormal weight loss Eyes: Denies blurriness of vision Ears, nose, mouth, throat, and face: Denies mucositis or sore throat Respiratory: Denies cough, dyspnea or wheezes Cardiovascular: Denies palpitation, chest discomfort Gastrointestinal: Denies nausea, heartburn or change in bowel habits Skin: Denies abnormal skin rashes Lymphatics: Denies new lymphadenopathy or easy bruising Neurological: Denies numbness, tingling or new weaknesses Behavioral/Psych: Mood is stable, no new changes  Extremities: No lower extremity edema Breast: denies any pain or lumps or nodules in either breasts All other systems were reviewed with the patient and are negative.  I have reviewed the past medical history, past surgical history, social history and family history with the patient and they are unchanged from previous note.  ALLERGIES:  is allergic to lisinopril.  MEDICATIONS:  Current Outpatient Medications  Medication Sig Dispense Refill  . Albuterol Sulfate (PROAIR RESPICLICK) 767 (90 Base) MCG/ACT AEPB Inhale 2 puffs into the lungs 4 (four) times daily as needed. 1 each 11  . allopurinol (ZYLOPRIM) 100 MG tablet TAKE 2 TABLETS BY MOUTH ONCE DAILY 180 tablet 1  . aspirin EC 81  MG tablet Take 1 tablet (81 mg total) by mouth daily. 90 tablet 3  . atorvastatin (LIPITOR) 40 MG tablet Take 1 tablet (40 mg total) by mouth daily. 90 tablet 1  . Blood Glucose Monitoring Suppl (ACCU-CHEK GUIDE) w/Device KIT 1 Act by Does not apply route 3 (three) times daily. 2 kit 0  . Calcium Carb-Cholecalciferol (CALCIUM 600 + D PO) Take 1 tablet by mouth daily.    . carvedilol (COREG) 6.25 MG tablet TAKE 1 TABLET BY MOUTH TWICE DAILY WITH A MEAL 180 tablet 1  . citalopram (CELEXA) 20 MG tablet Take 1 tablet (20 mg total) by mouth daily. 90 tablet 1  . cyanocobalamin 2000 MCG tablet Take 1 tablet (2,000 mcg total) by mouth daily. 90 tablet 1  . DEXILANT 60 MG capsule TAKE 1 CAPSULE BY MOUTH ONCE DAILY 90 capsule 1  . folic acid (FOLVITE) 1 MG tablet TAKE TWO (2) TABLETS (2 MG TOTAL)  BY MOUTH DAILY    . gabapentin (NEURONTIN) 600 MG tablet TAKE 1 TABLET BY MOUTH THREE TIMES DAILY 270 tablet 1  . glucose blood (ACCU-CHEK GUIDE) test strip Use TID 100 each 12  . leflunomide (ARAVA) 20 MG tablet TAKE 1 TABLET BY MOUTH ONCE DAILY 90 tablet 0  . metFORMIN (GLUCOPHAGE) 500 MG tablet Take 1 tablet (500 mg total) by mouth 2 (two) times daily with a meal. 180 tablet 1  . Omega-3 Fatty Acids (FISH OIL) 1200 MG CAPS Take 1,200 mg by mouth daily.     . sacubitril-valsartan (ENTRESTO) 97-103 MG Take 1 tablet by mouth 2 (two) times daily. 60 tablet 11  . spironolactone (ALDACTONE) 25  MG tablet TAKE 1 TABLET BY MOUTH ONCE DAILY. PLEASE  KEEP  UPCOMING  APPOINTMENT  IN  NOVEMBER  FOR  FUTURE  REFILLS 90 tablet 3  . vitamin C (ASCORBIC ACID) 500 MG tablet Take 500 mg by mouth daily as needed (for cold symptoms).     No current facility-administered medications for this visit.     PHYSICAL EXAMINATION: ECOG PERFORMANCE STATUS: 1 - Symptomatic but completely ambulatory  Vitals:   08/05/18 1509  BP: 134/72  Pulse: 71  Resp: 18  Temp: 98.3 F (36.8 C)  SpO2: 100%   Filed Weights   08/05/18 1509    Weight: 209 lb 11.2 oz (95.1 kg)    GENERAL: alert, no distress and comfortable SKIN: skin color, texture, turgor are normal, no rashes or significant lesions EYES: normal, Conjunctiva are pink and non-injected, sclera clear OROPHARYNX: no exudate, no erythema and lips, buccal mucosa, and tongue normal  NECK: supple, thyroid normal size, non-tender, without nodularity LYMPH: no palpable lymphadenopathy in the cervical, axillary or inguinal LUNGS: clear to auscultation and percussion with normal breathing effort HEART: regular rate & rhythm and no murmurs and no lower extremity edema ABDOMEN: abdomen soft, non-tender and normal bowel sounds MUSCULOSKELETAL: no cyanosis of digits and no clubbing  NEURO: alert & oriented x 3 with fluent speech, no focal motor/sensory deficits EXTREMITIES: No lower extremity edema  LABORATORY DATA:  I have reviewed the data as listed CMP Latest Ref Rng & Units 07/16/2018 04/28/2018 04/22/2018  Glucose 70 - 99 mg/dL 74 113(H) 115(H)  BUN 6 - 23 mg/dL 16 17 19   Creatinine 0.40 - 1.20 mg/dL 1.06 1.12(H) 1.17(H)  Sodium 135 - 145 mEq/L 140 142 142  Potassium 3.5 - 5.1 mEq/L 4.1 4.7 4.8  Chloride 96 - 112 mEq/L 106 107(H) 104  CO2 19 - 32 mEq/L 26 20 23   Calcium 8.4 - 10.5 mg/dL 8.9 9.0 9.5  Total Protein 6.0 - 8.3 g/dL 6.9 - -  Total Bilirubin 0.2 - 1.2 mg/dL 0.3 - -  Alkaline Phos 39 - 117 U/L 92 - -  AST 0 - 37 U/L 15 - -  ALT 0 - 35 U/L 14 - -    Lab Results  Component Value Date   WBC 3.9 (L) 07/26/2018   HGB 9.6 (L) 07/26/2018   HCT 30.7 (L) 07/26/2018   MCV 95.6 07/26/2018   PLT 274 07/26/2018   NEUTROABS 1.3 (L) 07/26/2018    ASSESSMENT & PLAN:  Anemia of chronic disease Lab review 07/26/2018: Hemoglobin 9.6, WBC 3.9, ANC 1.3, MCV 95.6, platelet 274 Reticulocytes: 1.5%, absolute reticulocyte 47.8, immature reticulocyte fraction 16.7% which is elevated LDH 157, DAT negative, haptoglobin 227 Erythropoietin 30.7 SPEP: No M protein  The  above results ruled out any evidence of hemolysis, no evidence of monoclonal protein Previous work-up revealed normal iron studies and M35 and folic acid and thyroid function, creatinine 1.12 Since this work-up is not conclusive for any hematological disorder, I recommended a bone marrow biopsy.  This will also evaluate the cause of slightly lower neutrophil counts. Return to clinic 1 week after undergoing the bone marrow biopsy   Orders Placed This Encounter  Procedures  . CT BIOPSY    Standing Status:   Future    Standing Expiration Date:   09/09/2019    Order Specific Question:   Reason for Exam (SYMPTOM  OR DIAGNOSIS REQUIRED)    Answer:   Bone marrow biopsy for anemia and leucopenia    Order  Specific Question:   Preferred imaging location?    Answer:   [facility]    Comments:   Send for flow and cytogenetics   The patient has a good understanding of the overall plan. she agrees with it. she will call with any problems that may develop before the next visit here.  Nicholas Lose, MD 08/05/2018  Julious Oka Dorshimer am acting as scribe for Dr. Nicholas Lose.  I have reviewed the above documentation for accuracy and completeness, and I agree with the above.

## 2018-08-07 ENCOUNTER — Telehealth: Payer: Self-pay

## 2018-08-07 ENCOUNTER — Telehealth: Payer: Self-pay | Admitting: Hematology and Oncology

## 2018-08-07 NOTE — Telephone Encounter (Signed)
Called pt per 3/11 sch message - no answer - left message with date and time

## 2018-08-07 NOTE — Telephone Encounter (Signed)
Called Emily Wheeler and lvm with call back number to let her know that her appt with bone marrow biopsy will be done through [facility] Ct guided biopsy on 08/19/2018. Emily Wheeler had a duplicate bone marrow schedule in infusion as well. Cancelled bone marrow scheduled in infusion on 08/12/2018.

## 2018-08-12 ENCOUNTER — Other Ambulatory Visit: Payer: Medicare Other

## 2018-08-13 ENCOUNTER — Other Ambulatory Visit: Payer: Self-pay | Admitting: Internal Medicine

## 2018-08-13 DIAGNOSIS — E118 Type 2 diabetes mellitus with unspecified complications: Secondary | ICD-10-CM

## 2018-08-13 DIAGNOSIS — E785 Hyperlipidemia, unspecified: Secondary | ICD-10-CM

## 2018-08-18 ENCOUNTER — Other Ambulatory Visit: Payer: Self-pay | Admitting: Physician Assistant

## 2018-08-19 ENCOUNTER — Other Ambulatory Visit: Payer: Self-pay

## 2018-08-19 ENCOUNTER — Ambulatory Visit (HOSPITAL_COMMUNITY)
Admission: RE | Admit: 2018-08-19 | Discharge: 2018-08-19 | Disposition: A | Discharge status: Home / Self Care | Payer: Medicare Other | Source: Ambulatory Visit | Attending: Hematology and Oncology | Admitting: Hematology and Oncology

## 2018-08-19 ENCOUNTER — Encounter (HOSPITAL_COMMUNITY): Payer: Self-pay

## 2018-08-19 DIAGNOSIS — Z7984 Long term (current) use of oral hypoglycemic drugs: Secondary | ICD-10-CM | POA: Insufficient documentation

## 2018-08-19 DIAGNOSIS — I429 Cardiomyopathy, unspecified: Secondary | ICD-10-CM | POA: Diagnosis not present

## 2018-08-19 DIAGNOSIS — Z8249 Family history of ischemic heart disease and other diseases of the circulatory system: Secondary | ICD-10-CM | POA: Insufficient documentation

## 2018-08-19 DIAGNOSIS — D72819 Decreased white blood cell count, unspecified: Secondary | ICD-10-CM | POA: Diagnosis not present

## 2018-08-19 DIAGNOSIS — E1140 Type 2 diabetes mellitus with diabetic neuropathy, unspecified: Secondary | ICD-10-CM | POA: Insufficient documentation

## 2018-08-19 DIAGNOSIS — Z87891 Personal history of nicotine dependence: Secondary | ICD-10-CM | POA: Insufficient documentation

## 2018-08-19 DIAGNOSIS — M069 Rheumatoid arthritis, unspecified: Secondary | ICD-10-CM | POA: Diagnosis not present

## 2018-08-19 DIAGNOSIS — E538 Deficiency of other specified B group vitamins: Secondary | ICD-10-CM | POA: Diagnosis not present

## 2018-08-19 DIAGNOSIS — K219 Gastro-esophageal reflux disease without esophagitis: Secondary | ICD-10-CM | POA: Diagnosis not present

## 2018-08-19 DIAGNOSIS — D649 Anemia, unspecified: Secondary | ICD-10-CM | POA: Insufficient documentation

## 2018-08-19 DIAGNOSIS — D7589 Other specified diseases of blood and blood-forming organs: Secondary | ICD-10-CM | POA: Diagnosis not present

## 2018-08-19 DIAGNOSIS — I110 Hypertensive heart disease with heart failure: Secondary | ICD-10-CM | POA: Diagnosis not present

## 2018-08-19 DIAGNOSIS — D638 Anemia in other chronic diseases classified elsewhere: Secondary | ICD-10-CM

## 2018-08-19 DIAGNOSIS — Z833 Family history of diabetes mellitus: Secondary | ICD-10-CM | POA: Insufficient documentation

## 2018-08-19 DIAGNOSIS — E785 Hyperlipidemia, unspecified: Secondary | ICD-10-CM | POA: Insufficient documentation

## 2018-08-19 DIAGNOSIS — D519 Vitamin B12 deficiency anemia, unspecified: Secondary | ICD-10-CM | POA: Diagnosis not present

## 2018-08-19 DIAGNOSIS — Z7982 Long term (current) use of aspirin: Secondary | ICD-10-CM | POA: Insufficient documentation

## 2018-08-19 DIAGNOSIS — Z79899 Other long term (current) drug therapy: Secondary | ICD-10-CM | POA: Diagnosis not present

## 2018-08-19 DIAGNOSIS — I509 Heart failure, unspecified: Secondary | ICD-10-CM | POA: Insufficient documentation

## 2018-08-19 DIAGNOSIS — D709 Neutropenia, unspecified: Secondary | ICD-10-CM | POA: Diagnosis not present

## 2018-08-19 LAB — CBC WITH DIFFERENTIAL/PLATELET
ABS IMMATURE GRANULOCYTES: 0 10*3/uL (ref 0.00–0.07)
Basophils Absolute: 0 10*3/uL (ref 0.0–0.1)
Basophils Relative: 1 %
Eosinophils Absolute: 0.3 10*3/uL (ref 0.0–0.5)
Eosinophils Relative: 6 %
HCT: 32.1 % — ABNORMAL LOW (ref 36.0–46.0)
Hemoglobin: 10 g/dL — ABNORMAL LOW (ref 12.0–15.0)
Immature Granulocytes: 0 %
Lymphocytes Relative: 50 %
Lymphs Abs: 2.4 10*3/uL (ref 0.7–4.0)
MCH: 30.6 pg (ref 26.0–34.0)
MCHC: 31.2 g/dL (ref 30.0–36.0)
MCV: 98.2 fL (ref 80.0–100.0)
Monocytes Absolute: 0.5 10*3/uL (ref 0.1–1.0)
Monocytes Relative: 12 %
Neutro Abs: 1.4 10*3/uL — ABNORMAL LOW (ref 1.7–7.7)
Neutrophils Relative %: 31 %
Platelets: 244 10*3/uL (ref 150–400)
RBC: 3.27 MIL/uL — ABNORMAL LOW (ref 3.87–5.11)
RDW: 16.6 % — ABNORMAL HIGH (ref 11.5–15.5)
WBC: 4.6 10*3/uL (ref 4.0–10.5)
nRBC: 0 % (ref 0.0–0.2)

## 2018-08-19 LAB — PROTIME-INR
INR: 0.9 (ref 0.8–1.2)
Prothrombin Time: 12.2 seconds (ref 11.4–15.2)

## 2018-08-19 LAB — GLUCOSE, CAPILLARY: Glucose-Capillary: 102 mg/dL — ABNORMAL HIGH (ref 70–99)

## 2018-08-19 MED ORDER — HYDROCODONE-ACETAMINOPHEN 5-325 MG PO TABS: 1.0000 | ORAL_TABLET | ORAL | Status: DC | PRN

## 2018-08-19 MED ORDER — MIDAZOLAM HCL 2 MG/2ML IJ SOLN
INTRAMUSCULAR | Status: AC
  Filled 2018-08-19: qty 4

## 2018-08-19 MED ORDER — FLUMAZENIL 0.5 MG/5ML IV SOLN
INTRAVENOUS | Status: AC
  Filled 2018-08-19: qty 5

## 2018-08-19 MED ORDER — FENTANYL CITRATE (PF) 100 MCG/2ML IJ SOLN
INTRAMUSCULAR | Status: AC | PRN
  Administered 2018-08-19 (×2): 50 ug via INTRAVENOUS

## 2018-08-19 MED ORDER — NALOXONE HCL 0.4 MG/ML IJ SOLN
INTRAMUSCULAR | Status: AC
  Filled 2018-08-19: qty 1

## 2018-08-19 MED ORDER — SODIUM CHLORIDE 0.9 % IV SOLN
INTRAVENOUS | Status: DC
  Administered 2018-08-19: 08:00:00 via INTRAVENOUS

## 2018-08-19 MED ORDER — MIDAZOLAM HCL 2 MG/2ML IJ SOLN
INTRAMUSCULAR | Status: AC | PRN
  Administered 2018-08-19 (×4): 1 mg via INTRAVENOUS

## 2018-08-19 MED ORDER — FENTANYL CITRATE (PF) 100 MCG/2ML IJ SOLN
INTRAMUSCULAR | Status: AC
  Filled 2018-08-19: qty 4

## 2018-08-19 MED ORDER — LIDOCAINE HCL (PF) 1 % IJ SOLN
INTRAMUSCULAR | Status: AC | PRN
  Administered 2018-08-19: 5 mL
  Administered 2018-08-19: 10 mL

## 2018-08-19 NOTE — Procedures (Signed)
Interventional Radiology Procedure:   Indications: B12 deficiency, anemia and leukopenia of uncertain etiology  Procedure: CT guided bone marrow biopsy  Findings: 2 aspirates and 1 core from right ilium  Complications: None     EBL: Minimal, less than 10 ml  Plan: Discharge to home in one hour.   Iyauna Sing R. Anselm Pancoast, MD  Pager: [phone]

## 2018-08-19 NOTE — Discharge Instructions (Addendum)
Moderate Conscious Sedation, Adult, Care After °These instructions provide you with information about caring for yourself after your procedure. Your health care provider may also give you more specific instructions. Your treatment has been planned according to current medical practices, but problems sometimes occur. Call your health care provider if you have any problems or questions after your procedure. °What can I expect after the procedure? °After your procedure, it is common: °· To feel sleepy for several hours. °· To feel clumsy and have poor balance for several hours. °· To have poor judgment for several hours. °· To vomit if you eat too soon. °Follow these instructions at home: °For at least 24 hours after the procedure: ° °· Do not: °? Participate in activities where you could fall or become injured. °? Drive. °? Use heavy machinery. °? Drink alcohol. °? Take sleeping pills or medicines that cause drowsiness. °? Make important decisions or sign legal documents. °? Take care of children on your own. °· Rest. °Eating and drinking °· Follow the diet recommended by your health care provider. °· If you vomit: °? Drink water, juice, or soup when you can drink without vomiting. °? Make sure you have little or no nausea before eating solid foods. °General instructions °· Have a responsible adult stay with you until you are awake and alert. °· Take over-the-counter and prescription medicines only as told by your health care provider. °· If you smoke, do not smoke without supervision. °· Keep all follow-up visits as told by your health care provider. This is important. °Contact a health care provider if: °· You keep feeling nauseous or you keep vomiting. °· You feel light-headed. °· You develop a rash. °· You have a fever. °Get help right away if: °· You have trouble breathing. °This information is not intended to replace advice given to you by your health care provider. Make sure you discuss any questions you have  with your health care provider. °Document Released: 03/04/2013 Document Revised: 10/17/2015 Document Reviewed: 09/03/2015 °Elsevier Interactive Patient Education © 2019 [employer] °Bone Marrow Aspiration and Bone Marrow Biopsy, Adult, Care After °This sheet gives you information about how to care for yourself after your procedure. Your health care provider may also give you more specific instructions. If you have problems or questions, contact your health care provider. °What can I expect after the procedure? °After the procedure, it is common to have: °· Mild pain and tenderness. °· Swelling. °· Bruising. °Follow these instructions at home: °Puncture site care ° °  ° °· Follow instructions from your health care provider about how to take care of the puncture site. Make sure you: °? Wash your hands with soap and water before you change your bandage (dressing). If soap and water are not available, use hand sanitizer. °? Change your dressing as told by your health care provider. °· Check your puncture site every day for signs of infection. Check for: °? More redness, swelling, or pain. °? More fluid or blood. °? Warmth. °? Pus or a bad smell. °General instructions °· Take over-the-counter and prescription medicines only as told by your health care provider. °· Do not take baths, swim, or use a hot tub until your health care provider approves. Ask if you can take a shower or have a sponge bath. °· Return to your normal activities as told by your health care provider. Ask your health care provider what activities are safe for you. °· Do not drive for 24 hours if you were given   a medicine to help you relax (sedative) during your procedure. °· Keep all follow-up visits as told by your health care provider. This is important. °Contact a health care provider if: °· Your pain is not controlled with medicine. °Get help right away if: °· You have a fever. °· You have more redness, swelling, or pain around the puncture  site. °· You have more fluid or blood coming from the puncture site. °· Your puncture site feels warm to the touch. °· You have pus or a bad smell coming from the puncture site. °These symptoms may represent a serious problem that is an emergency. Do not wait to see if the symptoms will go away. Get medical help right away. Call your local emergency services (911 in the [place] Do not drive yourself to the hospital. °Summary °· After the procedure, it is common to have mild pain, tenderness, swelling, and bruising. °· Follow instructions from your health care provider about how to take care of the puncture site. °· Get help right away if you have any symptoms of infection or if you have more blood or fluid coming from the puncture site. °This information is not intended to replace advice given to you by your health care provider. Make sure you discuss any questions you have with your health care provider. °Document Released: 12/01/2004 Document Revised: 08/27/2017 Document Reviewed: 10/26/2015 °Elsevier Interactive Patient Education © 2019 Elsevier Inc. ° °

## 2018-08-19 NOTE — Consult Note (Signed)
Chief Complaint: Patient was seen in consultation today for CT guided bone marrow biopsy  Referring Physician(s): Gudena,Vinay  Supervising Physician: Markus Daft  Patient Status: [facility] - Out-pt  History of Present Illness: Emily Wheeler is a 63 y.o. female with history of B12 deficiency, anemia and leukopenia of uncertain etiology who presents today for CT-guided bone marrow biopsy for further evaluation.  Past Medical History:  Diagnosis Date  . Anemia    mild  . Anxiety   . Arthritis   . Asthma   . Cardiomyopathy ([place])   . CHF (congestive heart failure) ([place])   . Diabetes mellitus without complication ([place])   . Frequency of urination    at night  . GERD (gastroesophageal reflux disease)    potassium worsens reflux  . Hemorrhoids, external   . Hyperlipidemia   . Hypertension   . Neuromuscular disorder ([facility])    neuropathy  . Numbness and tingling of both legs   . Rheumatoid arthritis ([place])   . Vertigo    hx of    Past Surgical History:  Procedure Laterality Date  . ABDOMINAL HYSTERECTOMY    . CARDIAC CATHETERIZATION N/A 10/28/2014   Procedure: Left Heart Cath and Coronary Angiography;  Surgeon: Jettie Booze, MD;  Location: [place] [facility];  Service: Cardiovascular;  Laterality: N/A;  . CARPAL TUNNEL RELEASE Left   . COLONOSCOPY     pt states 11 yr ago in [place] had a colon with 2 polyps- one cecal polyp per pt. one ? location  . COLONOSCOPY W/ POLYPECTOMY    . CYST REMOVAL NECK    . HEMORROIDECTOMY    . MAXIMUM ACCESS (MAS)POSTERIOR LUMBAR INTERBODY FUSION (PLIF) 3 LEVEL N/A 10/08/2013   Procedure: FOR MAXIMUM ACCESS (MAS) POSTERIOR LUMBAR INTERBODY FUSION (PLIF) 3 LEVEL;  Surgeon: Erline Levine, MD;  Location: [place] [facility];  Service: Neurosurgery;  Laterality: N/A;  L3-4 L4-5 L5-S1 maximum access posterior lumbar interbody fusion with decompression  . TUBAL LIGATION      Allergies: Lisinopril  Medications: Prior to Admission medications    Medication Sig Start Date End Date Taking? Authorizing Provider  Albuterol Sulfate (PROAIR RESPICLICK) 423 (90 Base) MCG/ACT AEPB Inhale 2 puffs into the lungs 4 (four) times daily as needed. 03/19/17  Yes [name], MD  allopurinol (ZYLOPRIM) 100 MG tablet Take 2 tablets by mouth once daily 08/06/18  Yes [name], MD  aspirin EC 81 MG tablet Take 1 tablet (81 mg total) by mouth daily. 10/22/14  Yes Dorothy Spark, MD  atorvastatin (LIPITOR) 40 MG tablet Take 1 tablet by mouth once daily 08/13/18  Yes [name], MD  Calcium Carb-Cholecalciferol (CALCIUM 600 + D PO) Take 1 tablet by mouth daily.   Yes [provider]  carvedilol (COREG) 6.25 MG tablet TAKE 1 TABLET BY MOUTH TWICE DAILY WITH A MEAL 07/23/18  Yes [name], MD  citalopram (CELEXA) 20 MG tablet Take 1 tablet by mouth once daily 08/13/18  Yes [name], MD  cyanocobalamin 2000 MCG tablet Take 1 tablet (2,000 mcg total) by mouth daily. 08/16/17  Yes [name], MD  DEXILANT 60 MG capsule TAKE 1 CAPSULE BY MOUTH ONCE DAILY 12/26/17  Yes [name], MD  gabapentin (NEURONTIN) 600 MG tablet TAKE 1 TABLET BY MOUTH THREE TIMES DAILY 05/31/18  Yes [name], MD  leflunomide (ARAVA) 20 MG tablet TAKE 1 TABLET BY MOUTH ONCE DAILY 08/06/18  Yes [name],  MD  metFORMIN (GLUCOPHAGE) 500 MG tablet Take 1 tablet (500 mg total) by mouth 2 (two) times daily with a meal. 07/23/18  Yes [name], MD  Omega-3 Fatty Acids (FISH OIL) 1200 MG CAPS Take 1,200 mg by mouth daily.    Yes [provider]  sacubitril-valsartan (ENTRESTO) 97-103 MG Take 1 tablet by mouth 2 (two) times daily. 04/23/18  Yes Dorothy Spark, MD  spironolactone (ALDACTONE) 25 MG tablet TAKE 1 TABLET BY MOUTH ONCE DAILY. PLEASE  KEEP  UPCOMING  APPOINTMENT  IN  NOVEMBER  FOR  FUTURE  REFILLS 04/22/18  Yes Dorothy Spark, MD  vitamin C (ASCORBIC ACID) 500 MG tablet Take 500 mg by mouth daily as needed (for cold  symptoms).   Yes [provider]  Blood Glucose Monitoring Suppl (ACCU-CHEK GUIDE) w/Device KIT 1 Act by Does not apply route 3 (three) times daily. 10/31/16   [name], MD  folic acid (FOLVITE) 1 MG tablet TAKE TWO (2) TABLETS (2 MG TOTAL)  BY MOUTH DAILY    [provider]  glucose blood (ACCU-CHEK GUIDE) test strip Use TID 10/31/16   [name], MD     Family History  Problem Relation Age of Onset  . Early death Father   . Heart disease Father   . Hypertension Sister   . Hypertension Brother   . Diabetes Brother   . Colon polyps Mother   . Hashimoto's thyroiditis Sister   . Colon cancer Maternal Grandmother        in her 33s  . Stomach cancer Maternal Aunt   . Non-Hodgkin's lymphoma Sister   . Alcohol abuse Neg Hx   . COPD Neg Hx   . Depression Neg Hx   . Drug abuse Neg Hx   . Hearing loss Neg Hx   . Hyperlipidemia Neg Hx   . Kidney disease Neg Hx   . Stroke Neg Hx   . Esophageal cancer Neg Hx   . Rectal cancer Neg Hx     Social History   Socioeconomic History  . Marital status: Single    Spouse name: Not on file  . Number of children: Not on file  . Years of education: Not on file  . Highest education level: Not on file  Occupational History  . Not on file  Social Needs  . Financial resource strain: Not on file  . Food insecurity:    Worry: Not on file    Inability: Not on file  . Transportation needs:    Medical: Not on file    Non-medical: Not on file  Tobacco Use  . Smoking status: Former Research scientist (life sciences)  . Smokeless tobacco: Never Used  Substance and Sexual Activity  . Alcohol use: Yes    Comment: 3 x a year -rare   . Drug use: No    Types: Marijuana  . Sexual activity: Not Currently  Lifestyle  . Physical activity:    Days per week: Not on file    Minutes per session: Not on file  . Stress: Not on file  Relationships  . Social connections:    Talks on phone: Not on file    Gets together: Not on file    Attends religious  service: Not on file    Active member of club or organization: Not on file    Attends meetings of clubs or organizations: Not on file    Relationship status: Not on file  Other Topics Concern  .  Not on file  Social History Narrative  . Not on file     Review of Systems denies fever, headache, chest pain, dyspnea, cough, abdominal/back pain, nausea, vomiting or bleeding  Vital Signs: BP (!) 161/92   Pulse 68   Temp 98.3 F (36.8 C) (Oral)   Resp 18   LMP 05/29/1991   SpO2 100%   Physical Exam awake, alert.  Chest clear to auscultation bilaterally.  Heart with regular rate and rhythm.  Abdomen obese, soft, positive bowel sounds, nontender.  No lower extremity edema.  Imaging: No results found.  Labs:  CBC: Recent Labs    11/26/17 0920 07/16/18 1634 07/26/18 0859 08/19/18 0800  WBC 5.0 4.6 3.9* 4.6  HGB 11.2* 10.0* 9.6* 10.0*  HCT 33.9* 30.2* 30.7* 32.1*  PLT 239.0 284.0 274 244    COAGS: Recent Labs    08/19/18 0800  INR 0.9    BMP: Recent Labs    11/26/17 0920 04/22/18 0929 04/28/18 1616 07/16/18 1634  NA 141 142 142 140  K 4.0 4.8 4.7 4.1  CL 107 104 107* 106  CO2 25 23 20 26   GLUCOSE 132* 115* 113* 74  BUN 17 19 17 16   CALCIUM 9.7 9.5 9.0 8.9  CREATININE 1.05 1.17* 1.12* 1.06  GFRNONAA  --  49* 52*  --   GFRAA  --  57* 60  --     LIVER FUNCTION TESTS: Recent Labs    07/16/18 1634  BILITOT 0.3  AST 15  ALT 14  ALKPHOS 92  PROT 6.9  ALBUMIN 4.1    TUMOR MARKERS: No results for input(s): AFPTM, CEA, CA199, CHROMGRNA in the last 8760 hours.  Assessment and Plan: 63 y.o. female with history of B12 deficiency, anemia and leukopenia of uncertain etiology who presents today for CT-guided bone marrow biopsy for further evaluation.Risks and benefits of procedure was discussed with the patient and/or patient's family including, but not limited to bleeding, infection, damage to adjacent structures or low yield requiring additional tests.  All  of the questions were answered and there is agreement to proceed.  Consent signed and in chart.      Thank you for this interesting consult.  I greatly enjoyed meeting Emily Wheeler and look forward to participating in their care.  A copy of this report was sent to the requesting provider on this date.  Electronically Signed: D. Rowe Robert, PA-C 08/19/2018, 8:35 AM   I spent a total of 20 minutes in face to face in clinical consultation, greater than 50% of which was counseling/coordinating care for CT-guided bone marrow biopsy

## 2018-08-20 ENCOUNTER — Other Ambulatory Visit: Payer: Self-pay | Admitting: Internal Medicine

## 2018-08-20 DIAGNOSIS — K219 Gastro-esophageal reflux disease without esophagitis: Secondary | ICD-10-CM

## 2018-08-25 ENCOUNTER — Encounter: Payer: Self-pay | Admitting: Hematology and Oncology

## 2018-08-26 ENCOUNTER — Encounter: Payer: Self-pay | Admitting: Hematology and Oncology

## 2018-08-27 ENCOUNTER — Encounter (HOSPITAL_COMMUNITY): Payer: Self-pay | Admitting: Hematology and Oncology

## 2018-09-02 ENCOUNTER — Other Ambulatory Visit: Payer: Self-pay | Admitting: Internal Medicine

## 2018-09-02 DIAGNOSIS — E118 Type 2 diabetes mellitus with unspecified complications: Secondary | ICD-10-CM

## 2018-09-02 MED ORDER — ACCU-CHEK FASTCLIX LANCETS MISC: 1.0000 | Freq: Three times a day (TID) | 1 refills | Status: DC

## 2018-09-02 MED ORDER — ACCU-CHEK FASTCLIX LANCET KIT: 1.0000 | PACK | Freq: Three times a day (TID) | 1 refills | Status: DC

## 2018-10-23 ENCOUNTER — Other Ambulatory Visit: Payer: Self-pay | Admitting: Internal Medicine

## 2018-10-23 ENCOUNTER — Telehealth: Payer: Self-pay | Admitting: Internal Medicine

## 2018-10-23 DIAGNOSIS — J45990 Exercise induced bronchospasm: Secondary | ICD-10-CM

## 2018-10-23 MED ORDER — ALBUTEROL SULFATE 108 (90 BASE) MCG/ACT IN AEPB: 2.0000 | INHALATION_SPRAY | Freq: Four times a day (QID) | RESPIRATORY_TRACT | 5 refills | Status: DC | PRN

## 2018-10-23 NOTE — Telephone Encounter (Unsigned)
Copied from CRM [id number]. Topic: Quick Communication - Rx Refill/Question >> Oct 23, 2018  3:37 PM Wyonia Hough E wrote: Medication: Albuterol Sulfate (PROAIR RESPICLICK) 108 (90 Base) MCG/ACT AEPB - Pt prefers the mist she had before and not this one with the powder/ please advise   Has the patient contacted their pharmacy?  Yes - call PCP  Preferred Pharmacy (with phone number or street name): [facility] [address] [place], [place] - [address] [phone] (Phone) [phone] (Fax)    Agent: Please be advised that RX refills may take up to 3 business days. We ask that you follow-up with your pharmacy.

## 2018-10-23 NOTE — Progress Notes (Signed)
[name] [place], [place] [address] Phone: [phone] Subjective:   I, Emily Wheeler, am serving as a scribe for Dr. Hulan Saas.  CC: Bilateral knee pain  ZDG:[id number]   04/09/2016: Bilateral injections given, minimal aspiration, likely mostly a synovisitis secondary to RA.   Encouraged patient seeinf rheumatologist.  Topical nsaids, discussed bracing, discussed icing, discussed proper shoes.  RTC in 4 weeks.   Update 10/24/2018: Emily Wheeler is a 63 y.o. female coming in with complaint of bilateral knee pain. States she has gout and ate seafood recently.  Patient has had gout as well as rheumatoid arthritis.  Feels like this could be contributing.  Patient states blood sugars have been fine.  Did notice that it got significantly worse after the last meal which was crab cakes     Past Medical History:  Diagnosis Date  . Anemia    mild  . Anxiety   . Arthritis   . Asthma   . Cardiomyopathy ([facility])   . CHF (congestive heart failure) ([facility])   . Diabetes mellitus without complication ([facility])   . Frequency of urination    at night  . GERD (gastroesophageal reflux disease)    potassium worsens reflux  . Hemorrhoids, external   . Hyperlipidemia   . Hypertension   . Neuromuscular disorder ([facility])    neuropathy  . Numbness and tingling of both legs   . Rheumatoid arthritis ([facility])   . Vertigo    hx of   Past Surgical History:  Procedure Laterality Date  . ABDOMINAL HYSTERECTOMY    . CARDIAC CATHETERIZATION N/A 10/28/2014   Procedure: Left Heart Cath and Coronary Angiography;  Surgeon: Jettie Booze, MD;  Location: [facility];  Service: Cardiovascular;  Laterality: N/A;  . CARPAL TUNNEL RELEASE Left   . COLONOSCOPY     pt states 11 yr ago in [place] had a colon with 2 polyps- one cecal polyp per pt. one ? location  . COLONOSCOPY W/ POLYPECTOMY    . CYST REMOVAL NECK    . HEMORROIDECTOMY    . MAXIMUM ACCESS  (MAS)POSTERIOR LUMBAR INTERBODY FUSION (PLIF) 3 LEVEL N/A 10/08/2013   Procedure: FOR MAXIMUM ACCESS (MAS) POSTERIOR LUMBAR INTERBODY FUSION (PLIF) 3 LEVEL;  Surgeon: Erline Levine, MD;  Location: [facility];  Service: Neurosurgery;  Laterality: N/A;  L3-4 L4-5 L5-S1 maximum access posterior lumbar interbody fusion with decompression  . TUBAL LIGATION     Social History   Socioeconomic History  . Marital status: Single    Spouse name: Not on file  . Number of children: Not on file  . Years of education: Not on file  . Highest education level: Not on file  Occupational History  . Not on file  Social Needs  . Financial resource strain: Not on file  . Food insecurity:    Worry: Not on file    Inability: Not on file  . Transportation needs:    Medical: Not on file    Non-medical: Not on file  Tobacco Use  . Smoking status: Former Research scientist (life sciences)  . Smokeless tobacco: Never Used  Substance and Sexual Activity  . Alcohol use: Yes    Comment: 3 x a year -rare   . Drug use: No    Types: Marijuana  . Sexual activity: Not Currently  Lifestyle  . Physical activity:    Days per week: Not on file    Minutes per session: Not on file  .  Stress: Not on file  Relationships  . Social connections:    Talks on phone: Not on file    Gets together: Not on file    Attends religious service: Not on file    Active member of club or organization: Not on file    Attends meetings of clubs or organizations: Not on file    Relationship status: Not on file  Other Topics Concern  . Not on file  Social History Narrative  . Not on file   Allergies  Allergen Reactions  . Lisinopril Cough   Family History  Problem Relation Age of Onset  . Early death Father   . Heart disease Father   . Hypertension Sister   . Hypertension Brother   . Diabetes Brother   . Colon polyps Mother   . Hashimoto's thyroiditis Sister   . Colon cancer Maternal Grandmother        in her 54s  . Stomach cancer Maternal Aunt    . Non-Hodgkin's lymphoma Sister   . Alcohol abuse Neg Hx   . COPD Neg Hx   . Depression Neg Hx   . Drug abuse Neg Hx   . Hearing loss Neg Hx   . Hyperlipidemia Neg Hx   . Kidney disease Neg Hx   . Stroke Neg Hx   . Esophageal cancer Neg Hx   . Rectal cancer Neg Hx     Current Outpatient Medications (Endocrine & Metabolic):  .  metFORMIN (GLUCOPHAGE) 500 MG tablet, Take 1 tablet (500 mg total) by mouth 2 (two) times daily with a meal.  Current Outpatient Medications (Cardiovascular):  .  atorvastatin (LIPITOR) 40 MG tablet, Take 1 tablet by mouth once daily .  carvedilol (COREG) 6.25 MG tablet, TAKE 1 TABLET BY MOUTH TWICE DAILY WITH A MEAL .  sacubitril-valsartan (ENTRESTO) 97-103 MG, Take 1 tablet by mouth 2 (two) times daily. Marland Kitchen  spironolactone (ALDACTONE) 25 MG tablet, TAKE 1 TABLET BY MOUTH ONCE DAILY. PLEASE  KEEP  UPCOMING  APPOINTMENT  IN  NOVEMBER  FOR  FUTURE  REFILLS  Current Outpatient Medications (Respiratory):  Marland Kitchen  Albuterol Sulfate (PROAIR RESPICLICK) 638 (90 Base) MCG/ACT AEPB, Inhale 2 puffs into the lungs 4 (four) times daily as needed.  Current Outpatient Medications (Analgesics):  .  aspirin EC 81 MG tablet, Take 1 tablet (81 mg total) by mouth daily. Marland Kitchen  leflunomide (ARAVA) 20 MG tablet, TAKE 1 TABLET BY MOUTH ONCE DAILY .  allopurinol (ZYLOPRIM) 300 MG tablet, Take 1 tablet (300 mg total) by mouth daily.  Current Outpatient Medications (Hematological):  .  cyanocobalamin 2000 MCG tablet, Take 1 tablet (2,000 mcg total) by mouth daily. .  folic acid (FOLVITE) 1 MG tablet, TAKE TWO (2) TABLETS (2 MG TOTAL)  BY MOUTH DAILY  Current Outpatient Medications (Other):  Marland Kitchen  Accu-Chek FastClix Lancets MISC, Inject 1 Act into the skin 3 (three) times daily. .  Blood Glucose Monitoring Suppl (ACCU-CHEK GUIDE) w/Device KIT, 1 Act by Does not apply route 3 (three) times daily. .  Calcium Carb-Cholecalciferol (CALCIUM 600 + D PO), Take 1 tablet by mouth daily. .  citalopram  (CELEXA) 20 MG tablet, Take 1 tablet by mouth once daily .  DEXILANT 60 MG capsule, Take 1 capsule by mouth once daily .  gabapentin (NEURONTIN) 600 MG tablet, TAKE 1 TABLET BY MOUTH THREE TIMES DAILY .  glucose blood (ACCU-CHEK GUIDE) test strip, Use TID .  Lancets Misc. (ACCU-CHEK FASTCLIX LANCET) KIT, Inject 1 Act into  the skin 3 (three) times daily. .  Omega-3 Fatty Acids (FISH OIL) 1200 MG CAPS, Take 1,200 mg by mouth daily.  .  vitamin C (ASCORBIC ACID) 500 MG tablet, Take 500 mg by mouth daily as needed (for cold symptoms).    Past medical history, social, surgical and family history all reviewed in electronic medical record.  No pertanent information unless stated regarding to the chief complaint.   Review of Systems:  No headache, visual changes, nausea, vomiting, diarrhea, constipation, dizziness, abdominal pain, skin rash, fevers, chills, night sweats, weight loss, swollen lymph nodes, body aches, joint swelling,  chest pain, shortness of breath, mood changes.  Positive muscle aches  Objective  Blood pressure 132/86, pulse 78, height 5' 5"  (1.651 m), weight 210 lb (95.3 kg), last menstrual period 05/29/1991, SpO2 98 %.    General: No apparent distress alert and oriented x3 mood and affect normal, dressed appropriately.  HEENT: Pupils equal, extraocular movements intact  Respiratory: Patient's speak in full sentences and does not appear short of breath  Cardiovascular: No lower extremity edema, non tender, no erythema  Skin: Warm dry intact with no signs of infection or rash on extremities or on axial skeleton.  Abdomen: Soft nontender  Neuro: Cranial nerves II through XII are intact, neurovascularly intact in all extremities with 2+ DTRs and 2+ pulses.  Lymph: No lymphadenopathy of posterior or anterior cervical chain or axillae bilaterally.  Gait antalgic gait.  MSK:  Non tender with full range of motion and good stability and symmetric strength and tone of shoulders, elbows,  wrist, hip, and ankles bilaterally.   Knee: Bilateral valgus deformity right-sided effusion noted. Large thigh to calf ratio.  Tender to palpation over medial and PF joint line.  ROM full in flexion and extension and lower leg rotation. instability with valgus force.  painful patellar compression. Patellar glide with moderate crepitus. Patellar and quadriceps tendons unremarkable. Hamstring and quadriceps strength is normal. No swelling of the left knee but tender to palpation  Procedure: Real-time Ultrasound Guided Injection of right knee Device: GE Logiq Q7 Ultrasound guided injection is preferred based studies that show increased duration, increased effect, greater accuracy, decreased procedural pain, increased response rate, and decreased cost with ultrasound guided versus blind injection.  Verbal informed consent obtained.  Time-out conducted.  Noted no overlying erythema, induration, or other signs of local infection.  Skin prepped in a sterile fashion.  Local anesthesia: Topical Ethyl chloride.  With sterile technique and under real time ultrasound guidance: With a 22-gauge 2 inch needle patient was injected with 4 cc of 0.5% Marcaine and aspirated 25 cc of swelling, fluid 1 cc of Kenalog 40 mg/dL. This was from a superior lateral approach.  Completed without difficulty  Pain immediately resolved suggesting accurate placement of the medication.  Advised to call if fevers/chills, erythema, induration, drainage, or persistent bleeding.  Images permanently stored and available for review in the ultrasound unit.  Impression: Technically successful ultrasound guided injection.  After informed written and verbal consent, patient was seated on exam table. Left knee was prepped with alcohol swab and utilizing anterolateral approach, patient's left knee space was injected with 4:1  marcaine 0.5%: Kenalog 32m/dL. Patient tolerated the procedure well without immediate complications.     Impression and Recommendations:     This case required medical decision making of moderate complexity. The above documentation has been reviewed and is accurate and complete ZLyndal Pulley DO       Note: This dictation was prepared  with Dragon dictation along with smaller phrase technology. Any transcriptional errors that result from this process are unintentional.

## 2018-10-24 ENCOUNTER — Ambulatory Visit (INDEPENDENT_AMBULATORY_CARE_PROVIDER_SITE_OTHER): Payer: Medicare Other | Admitting: Family Medicine

## 2018-10-24 ENCOUNTER — Ambulatory Visit: Payer: Self-pay

## 2018-10-24 ENCOUNTER — Other Ambulatory Visit: Payer: Self-pay

## 2018-10-24 ENCOUNTER — Encounter: Payer: Self-pay | Admitting: Family Medicine

## 2018-10-24 VITALS — BP 132/86 | HR 78 | Ht 65.0 in | Wt 210.0 lb

## 2018-10-24 DIAGNOSIS — M25562 Pain in left knee: Secondary | ICD-10-CM | POA: Diagnosis not present

## 2018-10-24 DIAGNOSIS — M25561 Pain in right knee: Secondary | ICD-10-CM | POA: Diagnosis not present

## 2018-10-24 DIAGNOSIS — G8929 Other chronic pain: Secondary | ICD-10-CM

## 2018-10-24 DIAGNOSIS — M170 Bilateral primary osteoarthritis of knee: Secondary | ICD-10-CM | POA: Diagnosis not present

## 2018-10-24 MED ORDER — ALLOPURINOL 300 MG PO TABS: 300.0000 mg | ORAL_TABLET | Freq: Every day | ORAL | 6 refills | Status: DC

## 2018-10-24 NOTE — Patient Instructions (Signed)
Good to see you  Stop at 3 crab cakes ;) Increased allopurinol 300mg  at night  Ice is your friend  See me again in 4 weeks if not great but otherwise be safe and see me when you need me

## 2018-10-24 NOTE — Assessment & Plan Note (Signed)
Bilateral injections given today.  Tolerated the procedure well.  Patient did have aspiration with ultrasound guidance on the right knee and had a regular injection of the left knee.  Discussed icing regimen and home exercises.  Discussed which activities of doing which wants to avoid.  Increase activity as tolerated.  Topical anti-inflammatories encouraged.  Follow-up again in 4 to 8 weeks.

## 2018-10-29 ENCOUNTER — Other Ambulatory Visit: Payer: Self-pay | Admitting: Internal Medicine

## 2018-10-29 DIAGNOSIS — E118 Type 2 diabetes mellitus with unspecified complications: Secondary | ICD-10-CM

## 2018-11-11 ENCOUNTER — Other Ambulatory Visit: Payer: Self-pay | Admitting: Internal Medicine

## 2018-11-11 DIAGNOSIS — I10 Essential (primary) hypertension: Secondary | ICD-10-CM

## 2018-11-12 ENCOUNTER — Other Ambulatory Visit: Payer: Self-pay | Admitting: Internal Medicine

## 2018-11-12 DIAGNOSIS — E785 Hyperlipidemia, unspecified: Secondary | ICD-10-CM

## 2018-11-12 DIAGNOSIS — E118 Type 2 diabetes mellitus with unspecified complications: Secondary | ICD-10-CM

## 2018-11-12 MED ORDER — ATORVASTATIN CALCIUM 40 MG PO TABS: 40.0000 mg | ORAL_TABLET | Freq: Every day | ORAL | 1 refills | Status: DC

## 2018-11-25 ENCOUNTER — Ambulatory Visit: Payer: Medicare Other | Admitting: Family Medicine

## 2018-12-04 ENCOUNTER — Other Ambulatory Visit: Payer: Self-pay | Admitting: Internal Medicine

## 2018-12-04 DIAGNOSIS — M48061 Spinal stenosis, lumbar region without neurogenic claudication: Secondary | ICD-10-CM

## 2019-01-18 ENCOUNTER — Other Ambulatory Visit: Payer: Self-pay | Admitting: Internal Medicine

## 2019-01-18 DIAGNOSIS — I10 Essential (primary) hypertension: Secondary | ICD-10-CM

## 2019-01-22 ENCOUNTER — Other Ambulatory Visit: Payer: Self-pay

## 2019-01-22 ENCOUNTER — Ambulatory Visit (INDEPENDENT_AMBULATORY_CARE_PROVIDER_SITE_OTHER): Payer: Medicare Other

## 2019-01-22 DIAGNOSIS — Z23 Encounter for immunization: Secondary | ICD-10-CM

## 2019-01-29 ENCOUNTER — Telehealth: Payer: Self-pay | Admitting: Internal Medicine

## 2019-01-29 DIAGNOSIS — E118 Type 2 diabetes mellitus with unspecified complications: Secondary | ICD-10-CM

## 2019-01-29 NOTE — Telephone Encounter (Signed)
RX REFILL glucose blood (ACCU-CHEK GUIDE) PHARMACY [place], [place] [employer] [phone] (Phone) [phone] (Fax

## 2019-01-30 MED ORDER — ACCU-CHEK GUIDE VI STRP: ORAL_STRIP | 12 refills | Status: DC

## 2019-01-30 NOTE — Telephone Encounter (Signed)
Erx sent as requested.  

## 2019-02-03 ENCOUNTER — Other Ambulatory Visit (INDEPENDENT_AMBULATORY_CARE_PROVIDER_SITE_OTHER): Payer: Medicare Other

## 2019-02-03 ENCOUNTER — Other Ambulatory Visit: Payer: Self-pay

## 2019-02-03 ENCOUNTER — Encounter: Payer: Self-pay | Admitting: Internal Medicine

## 2019-02-03 ENCOUNTER — Ambulatory Visit (INDEPENDENT_AMBULATORY_CARE_PROVIDER_SITE_OTHER): Payer: Medicare Other | Admitting: Internal Medicine

## 2019-02-03 VITALS — BP 134/82 | HR 68 | Temp 98.0°F | Resp 16 | Ht 65.0 in | Wt 215.8 lb

## 2019-02-03 DIAGNOSIS — D513 Other dietary vitamin B12 deficiency anemia: Secondary | ICD-10-CM | POA: Diagnosis not present

## 2019-02-03 DIAGNOSIS — D510 Vitamin B12 deficiency anemia due to intrinsic factor deficiency: Secondary | ICD-10-CM

## 2019-02-03 DIAGNOSIS — I10 Essential (primary) hypertension: Secondary | ICD-10-CM

## 2019-02-03 DIAGNOSIS — D638 Anemia in other chronic diseases classified elsewhere: Secondary | ICD-10-CM | POA: Diagnosis not present

## 2019-02-03 DIAGNOSIS — E118 Type 2 diabetes mellitus with unspecified complications: Secondary | ICD-10-CM

## 2019-02-03 DIAGNOSIS — M48062 Spinal stenosis, lumbar region with neurogenic claudication: Secondary | ICD-10-CM

## 2019-02-03 LAB — FOLATE: Folate: 24.7 ng/mL (ref >5.9)

## 2019-02-03 LAB — CBC WITH DIFFERENTIAL/PLATELET
Basophils Absolute: 0 10*3/uL (ref 0.0–0.1)
Basophils Relative: 0.9 % (ref 0.0–3.0)
Eosinophils Absolute: 0.2 10*3/uL (ref 0.0–0.7)
Eosinophils Relative: 5.5 % — ABNORMAL HIGH (ref 0.0–5.0)
HCT: 31.7 % — ABNORMAL LOW (ref 36.0–46.0)
Hemoglobin: 10.4 g/dL — ABNORMAL LOW (ref 12.0–15.0)
Lymphocytes Relative: 54.9 % — ABNORMAL HIGH (ref 12.0–46.0)
Lymphs Abs: 2.4 10*3/uL (ref 0.7–4.0)
MCHC: 32.8 g/dL (ref 30.0–36.0)
MCV: 98.7 fl (ref 78.0–100.0)
Monocytes Absolute: 0.6 10*3/uL (ref 0.1–1.0)
Monocytes Relative: 13.9 % — ABNORMAL HIGH (ref 3.0–12.0)
Neutro Abs: 1.1 10*3/uL — ABNORMAL LOW (ref 1.4–7.7)
Neutrophils Relative %: 24.8 % — ABNORMAL LOW (ref 43.0–77.0)
Platelets: 228 10*3/uL (ref 150.0–400.0)
RBC: 3.22 Mil/uL — ABNORMAL LOW (ref 3.87–5.11)
RDW: 15 % (ref 11.5–15.5)
WBC: 4.4 10*3/uL (ref 4.0–10.5)

## 2019-02-03 LAB — HEMOGLOBIN A1C: Hgb A1c MFr Bld: 6.4 % (ref 4.6–6.5)

## 2019-02-03 LAB — BASIC METABOLIC PANEL
BUN: 18 mg/dL (ref 6–23)
CO2: 26 mEq/L (ref 19–32)
Calcium: 8.8 mg/dL (ref 8.4–10.5)
Chloride: 107 mEq/L (ref 96–112)
Creatinine, Ser: 1.04 mg/dL (ref 0.40–1.20)
GFR: 64.36 mL/min (ref >60.00)
Glucose, Bld: 135 mg/dL — ABNORMAL HIGH (ref 70–99)
Potassium: 4.6 mEq/L (ref 3.5–5.1)
Sodium: 141 mEq/L (ref 135–145)

## 2019-02-03 LAB — VITAMIN B12: Vitamin B-12: 1266 pg/mL — ABNORMAL HIGH (ref 211–911)

## 2019-02-03 NOTE — Progress Notes (Signed)
Subjective:  Patient ID: Emily Wheeler, female    DOB: 29-Apr-1956  Age: 63 y.o. MRN: [id number]  CC: Diabetes, Hypertension, and Anemia   HPI Emily Wheeler presents for f/up - She has had a 67-monthhistory of having trouble using her lower legs and feels off balance.  She is being followed closely by a surgeon who did back surgery about 9 months ago.  She tells me her blood pressure and blood sugar have been well controlled.  She denies any recent episodes of CP, DOE, palpitations, edema, or fatigue.  Outpatient Medications Prior to Visit  Medication Sig Dispense Refill  . Accu-Chek FastClix Lancets MISC Inject 1 Act into the skin 3 (three) times daily. 306 each 1  . Albuterol Sulfate (PROAIR RESPICLICK) 1591(90 Base) MCG/ACT AEPB Inhale 2 puffs into the lungs 4 (four) times daily as needed. 1 each 5  . allopurinol (ZYLOPRIM) 300 MG tablet Take 1 tablet (300 mg total) by mouth daily. 30 tablet 6  . aspirin EC 81 MG tablet Take 1 tablet (81 mg total) by mouth daily. 90 tablet 3  . atorvastatin (LIPITOR) 40 MG tablet Take 1 tablet (40 mg total) by mouth daily. 90 tablet 1  . Blood Glucose Monitoring Suppl (ACCU-CHEK GUIDE) w/Device KIT 1 Act by Does not apply route 3 (three) times daily. 2 kit 0  . Calcium Carb-Cholecalciferol (CALCIUM 600 + D PO) Take 1 tablet by mouth daily.    . carvedilol (COREG) 6.25 MG tablet TAKE 1 TABLET BY MOUTH TWICE DAILY WITH A MEAL 180 tablet 0  . citalopram (CELEXA) 20 MG tablet Take 1 tablet by mouth once daily 90 tablet 1  . cyanocobalamin 2000 MCG tablet Take 1 tablet (2,000 mcg total) by mouth daily. 90 tablet 1  . DEXILANT 60 MG capsule Take 1 capsule by mouth once daily 90 capsule 1  . gabapentin (NEURONTIN) 600 MG tablet TAKE 1 TABLET BY MOUTH THREE TIMES DAILY 270 tablet 0  . glucose blood (ACCU-CHEK GUIDE) test strip Use TID 100 each 12  . Lancets Misc. (ACCU-CHEK FASTCLIX LANCET) KIT Inject 1 Act into the skin 3 (three) times daily. 306 kit 1  .  leflunomide (ARAVA) 20 MG tablet TAKE 1 TABLET BY MOUTH ONCE DAILY 90 tablet 1  . metFORMIN (GLUCOPHAGE) 500 MG tablet TAKE 1 TABLET BY MOUTH TWICE DAILY WITH A MEAL 180 tablet 0  . Omega-3 Fatty Acids (FISH OIL) 1200 MG CAPS Take 1,200 mg by mouth daily.     . sacubitril-valsartan (ENTRESTO) 97-103 MG Take 1 tablet by mouth 2 (two) times daily. 60 tablet 11  . spironolactone (ALDACTONE) 25 MG tablet TAKE 1 TABLET BY MOUTH ONCE DAILY. PLEASE  KEEP  UPCOMING  APPOINTMENT  IN  NOVEMBER  FOR  FUTURE  REFILLS 90 tablet 3  . vitamin C (ASCORBIC ACID) 500 MG tablet Take 500 mg by mouth daily as needed (for cold symptoms).    . folic acid (FOLVITE) 1 MG tablet TAKE TWO (2) TABLETS (2 MG TOTAL)  BY MOUTH DAILY     No facility-administered medications prior to visit.     ROS Review of Systems  Constitutional: Negative.  Negative for appetite change, diaphoresis, fatigue and unexpected weight change.  HENT: Negative.   Eyes: Negative for visual disturbance.  Respiratory: Negative for cough, chest tightness, shortness of breath and wheezing.   Cardiovascular: Negative for chest pain, palpitations and leg swelling.  Gastrointestinal: Negative for abdominal pain, constipation, diarrhea, nausea and vomiting.  Endocrine: Negative.  Negative for polyuria.  Genitourinary: Negative.  Negative for difficulty urinating.  Musculoskeletal: Positive for arthralgias, back pain and gait problem.  Skin: Negative.  Negative for color change.  Neurological: Negative for dizziness, weakness, light-headedness and numbness.  Hematological: Negative for adenopathy. Does not bruise/bleed easily.  Psychiatric/Behavioral: Negative.     Objective:  BP 134/82 (BP Location: Left Arm, Patient Position: Sitting, Cuff Size: Large)   Pulse 68   Temp 98 F (36.7 C) (Oral)   Resp 16   Ht 5' 5"  (1.651 m)   Wt 215 lb 12 oz (97.9 kg)   LMP 05/29/1991   SpO2 98%   BMI 35.90 kg/m   BP Readings from Last 3 Encounters:   02/03/19 134/82  10/24/18 132/86  08/19/18 118/87    Wt Readings from Last 3 Encounters:  02/03/19 215 lb 12 oz (97.9 kg)  10/24/18 210 lb (95.3 kg)  08/05/18 209 lb 11.2 oz (95.1 kg)    Physical Exam Vitals signs reviewed.  Constitutional:      Appearance: She is obese. She is not ill-appearing or diaphoretic.  HENT:     Nose: Nose normal.     Mouth/Throat:     Mouth: Mucous membranes are moist.  Eyes:     General: No scleral icterus.    Conjunctiva/sclera: Conjunctivae normal.  Neck:     Musculoskeletal: Normal range of motion and neck supple.  Cardiovascular:     Rate and Rhythm: Normal rate and regular rhythm.     Heart sounds: No murmur.  Pulmonary:     Effort: Pulmonary effort is normal.     Breath sounds: No stridor. No wheezing, rhonchi or rales.  Abdominal:     General: Abdomen is protuberant. Bowel sounds are normal. There is no distension.     Palpations: There is no hepatomegaly, splenomegaly or mass.     Tenderness: There is no abdominal tenderness.  Musculoskeletal: Normal range of motion.     Comments: Right foot is externally rotated and there is mild diffuse weakness in both lower extremities.  Lymphadenopathy:     Cervical: No cervical adenopathy.  Skin:    General: Skin is warm and dry.     Coloration: Skin is not pale.  Neurological:     General: No focal deficit present.     Mental Status: She is alert and oriented to person, place, and time.     Motor: Weakness present.     Coordination: Romberg sign negative. Coordination abnormal.     Deep Tendon Reflexes: Reflexes are normal and symmetric.  Psychiatric:        Mood and Affect: Mood normal.        Behavior: Behavior normal.     Lab Results  Component Value Date   WBC 4.4 02/03/2019   HGB 10.4 (L) 02/03/2019   HCT 31.7 (L) 02/03/2019   PLT 228.0 02/03/2019   GLUCOSE 135 (H) 02/03/2019   CHOL 140 07/16/2018   TRIG 185.0 (H) 07/16/2018   HDL 44.20 07/16/2018   LDLDIRECT 162.5  01/30/2013   LDLCALC 59 07/16/2018   ALT 14 07/16/2018   AST 15 07/16/2018   NA 141 02/03/2019   K 4.6 02/03/2019   CL 107 02/03/2019   CREATININE 1.04 02/03/2019   BUN 18 02/03/2019   CO2 26 02/03/2019   TSH 1.46 07/16/2018   INR 0.9 08/19/2018   HGBA1C 6.4 02/03/2019   MICROALBUR 0.9 07/16/2018    Ct Biopsy  Result Date: 08/19/2018 INDICATION:  64 year old with B12 deficiency, anemia and leukopenia. EXAM: CT GUIDED BONE MARROW ASPIRATES AND BIOPSY Physician: Stephan Minister. Anselm Pancoast, MD MEDICATIONS: None. ANESTHESIA/SEDATION: Fentanyl 100 mcg IV; Versed 4.0 mg IV Moderate Sedation Time:  12 minutes The patient was continuously monitored during the procedure by the interventional radiology nurse under my direct supervision. COMPLICATIONS: None immediate. PROCEDURE: The procedure was explained to the patient. The risks and benefits of the procedure were discussed and the patient's questions were addressed. Informed consent was obtained from the patient. The patient was placed prone on CT table. Images of the pelvis were obtained. The right side of back was prepped and draped in sterile fashion. The skin and right posterior ilium were anesthetized with 1% lidocaine. 11 gauge bone needle was directed into the right ilium with CT guidance. Two aspirates and one core biopsy were obtained. Bandage placed over the puncture site. IMPRESSION: CT guided bone marrow aspiration and core biopsy. Electronically Signed   By: Markus Daft M.D.   On: 08/19/2018 11:03   Ct Bone Marrow Biopsy & Aspiration  Result Date: 08/19/2018 INDICATION: 64 year old with B12 deficiency, anemia and leukopenia. EXAM: CT GUIDED BONE MARROW ASPIRATES AND BIOPSY Physician: Stephan Minister. Anselm Pancoast, MD MEDICATIONS: None. ANESTHESIA/SEDATION: Fentanyl 100 mcg IV; Versed 4.0 mg IV Moderate Sedation Time:  12 minutes The patient was continuously monitored during the procedure by the interventional radiology nurse under my direct supervision. COMPLICATIONS:  None immediate. PROCEDURE: The procedure was explained to the patient. The risks and benefits of the procedure were discussed and the patient's questions were addressed. Informed consent was obtained from the patient. The patient was placed prone on CT table. Images of the pelvis were obtained. The right side of back was prepped and draped in sterile fashion. The skin and right posterior ilium were anesthetized with 1% lidocaine. 11 gauge bone needle was directed into the right ilium with CT guidance. Two aspirates and one core biopsy were obtained. Bandage placed over the puncture site. IMPRESSION: CT guided bone marrow aspiration and core biopsy. Electronically Signed   By: Markus Daft M.D.   On: 08/19/2018 11:03    Assessment & Plan:   Emily Wheeler was seen today for diabetes, hypertension and anemia.  Diagnoses and all orders for this visit:  Anemia of chronic disease- Her H&H are stable.  She has minor changes in her cell lines that have been evaluated by hematology.  No significant changes noted. -     CBC with Differential/Platelet; Future  Other dietary vitamin B12 deficiency anemia  Vitamin B12 deficiency anemia due to intrinsic factor deficiency- Her B12 level is normal.  She will continue parenteral B12 replacement therapy monthly. -     CBC with Differential/Platelet; Future -     Vitamin B12; Future -     Folate; Future  Type II diabetes mellitus with manifestations ([place])- Her blood sugar is adequately well controlled. -     Hemoglobin A1c; Future -     Ambulatory referral to Ophthalmology -     HM Diabetes Foot Exam  Essential hypertension, benign- Her blood pressure is well controlled.  Electrolytes and renal function are normal. -     Basic metabolic panel; Future  Spinal stenosis of lumbar region with neurogenic claudication- Her symptoms are consistent with postsurgical complications.  She will continue address to address this with the back surgeon.   I am having Emily I.  Wheeler maintain her Fish Oil, aspirin EC, Calcium Carb-Cholecalciferol (CALCIUM 600 + D PO), vitamin C, Accu-Chek  Guide, folic acid, cyanocobalamin, spironolactone, sacubitril-valsartan, leflunomide, citalopram, Dexilant, Accu-Chek FastClix Lancets, Accu-Chek FastClix Lancet, Albuterol Sulfate, allopurinol, metFORMIN, atorvastatin, gabapentin, carvedilol, and Accu-Chek Guide.  No orders of the defined types were placed in this encounter.    Follow-up: Return in about 6 months (around 08/03/2019).  Scarlette Calico, MD

## 2019-02-03 NOTE — Patient Instructions (Signed)
Vitamin B12 Deficiency Vitamin B12 deficiency occurs when the body does not have enough vitamin B12, which is an important vitamin. The body needs this vitamin:  To make red blood cells.  To make DNA. This is the genetic material inside cells.  To help the nerves work properly so they can carry messages from the brain to the body. Vitamin B12 deficiency can cause various health problems, such as a low red blood cell count (anemia) or nerve damage. What are the causes? This condition may be caused by:  Not eating enough foods that contain vitamin B12.  Not having enough stomach acid and digestive fluids to properly absorb vitamin B12 from the food that you eat.  Certain digestive system diseases that make it hard to absorb vitamin B12. These diseases include Crohn's disease, chronic pancreatitis, and cystic fibrosis.  A condition in which the body does not make enough of a protein (intrinsic factor), resulting in too few red blood cells (pernicious anemia).  Having a surgery in which part of the stomach or small intestine is removed.  Taking certain medicines that make it hard for the body to absorb vitamin B12. These medicines include: ? Heartburn medicines (antacids and proton pump inhibitors). ? Certain antibiotic medicines. ? Some medicines that are used to treat diabetes, tuberculosis, gout, or high cholesterol. What increases the risk? The following factors may make you more likely to develop a B12 deficiency:  Being older than age 50.  Eating a vegetarian or vegan diet, especially while you are pregnant.  Eating a poor diet while you are pregnant.  Taking certain medicines.  Having alcoholism. What are the signs or symptoms? In some cases, there are no symptoms of this condition. If the condition leads to anemia or nerve damage, various symptoms can occur, such as:  Weakness.  Fatigue.  Loss of appetite.  Weight loss.  Numbness or tingling in your hands and  feet.  Redness and burning of the tongue.  Confusion or memory problems.  Depression.  Sensory problems, such as color blindness, ringing in the ears, or loss of taste.  Diarrhea or constipation.  Trouble walking. If anemia is severe, symptoms can include:  Shortness of breath.  Dizziness.  Rapid heart rate (tachycardia). How is this diagnosed? This condition may be diagnosed with a blood test to measure the level of vitamin B12 in your blood. You may also have other tests, including:  A group of tests that measure certain characteristics of blood cells (complete blood count, CBC).  A blood test to measure intrinsic factor.  A procedure where a thin tube with a camera on the end is used to look into your stomach or intestines (endoscopy). Other tests may be needed to discover the cause of B12 deficiency. How is this treated? Treatment for this condition depends on the cause. This condition may be treated by:  Changing your eating and drinking habits, such as: ? Eating more foods that contain vitamin B12. ? Drinking less alcohol or no alcohol.  Getting vitamin B12 injections.  Taking vitamin B12 supplements. Your health care provider will tell you which dosage is best for you. Follow these instructions at home: Eating and drinking   Eat lots of healthy foods that contain vitamin B12, including: ? Meats and poultry. This includes beef, pork, chicken, turkey, and organ meats, such as liver. ? Seafood. This includes clams, rainbow trout, salmon, tuna, and haddock. ? Eggs. ? Cereal and dairy products that are fortified. This means that vitamin B12   has been added to the food. Check the label on the package to see if the food is fortified. The items listed above may not be a complete list of recommended foods and beverages. Contact a dietitian for more information. General instructions  Get any injections that are prescribed by your health care provider.  Take  supplements only as told by your health care provider. Follow the directions carefully.  Do not drink alcohol if your health care provider tells you not to. In some cases, you may only be asked to limit alcohol use.  Keep all follow-up visits as told by your health care provider. This is important. Contact a health care provider if:  Your symptoms come back. Get help right away if you:  Develop shortness of breath.  Have a rapid heart rate.  Have chest pain.  Become dizzy or lose consciousness. Summary  Vitamin B12 deficiency occurs when the body does not have enough vitamin B12.  The main causes of vitamin B12 deficiency include dietary deficiency, digestive diseases, pernicious anemia, and having a surgery in which part of the stomach or small intestine is removed.  In some cases, there are no symptoms of this condition. If the condition leads to anemia or nerve damage, various symptoms can occur, such as weakness, shortness of breath, and numbness.  Treatment may include getting vitamin B12 injections or taking vitamin B12 supplements. Eat lots of healthy foods that contain vitamin B12. This information is not intended to replace advice given to you by your health care provider. Make sure you discuss any questions you have with your health care provider. Document Released: 08/06/2011 Document Revised: 01/21/2018 Document Reviewed: 01/21/2018 Elsevier Patient Education  2020 Elsevier Inc.  

## 2019-02-04 ENCOUNTER — Encounter: Payer: Self-pay | Admitting: Internal Medicine

## 2019-02-07 ENCOUNTER — Other Ambulatory Visit: Payer: Self-pay | Admitting: Internal Medicine

## 2019-02-07 DIAGNOSIS — M069 Rheumatoid arthritis, unspecified: Secondary | ICD-10-CM

## 2019-02-16 ENCOUNTER — Other Ambulatory Visit: Payer: Self-pay | Admitting: Cardiology

## 2019-02-16 MED ORDER — ENTRESTO 97-103 MG PO TABS: 1.0000 | ORAL_TABLET | Freq: Two times a day (BID) | ORAL | 0 refills | Status: DC

## 2019-02-17 ENCOUNTER — Other Ambulatory Visit: Payer: Self-pay | Admitting: Internal Medicine

## 2019-03-23 ENCOUNTER — Other Ambulatory Visit: Payer: Self-pay | Admitting: Internal Medicine

## 2019-03-23 ENCOUNTER — Other Ambulatory Visit: Payer: Self-pay | Admitting: Cardiology

## 2019-03-23 DIAGNOSIS — Z1231 Encounter for screening mammogram for malignant neoplasm of breast: Secondary | ICD-10-CM

## 2019-03-23 DIAGNOSIS — M48061 Spinal stenosis, lumbar region without neurogenic claudication: Secondary | ICD-10-CM

## 2019-04-10 ENCOUNTER — Telehealth: Payer: Self-pay | Admitting: Cardiology

## 2019-04-10 NOTE — Telephone Encounter (Signed)
° ° ° ° °  Called pt left a message for pt to call and schedule an appt with Dr Nelson(her first availble appt is in March) or she can see one of her APP.

## 2019-04-14 ENCOUNTER — Other Ambulatory Visit: Payer: Self-pay | Admitting: Cardiology

## 2019-04-14 ENCOUNTER — Other Ambulatory Visit: Payer: Self-pay | Admitting: Internal Medicine

## 2019-04-14 DIAGNOSIS — E118 Type 2 diabetes mellitus with unspecified complications: Secondary | ICD-10-CM

## 2019-04-29 ENCOUNTER — Telehealth: Payer: Self-pay

## 2019-04-29 ENCOUNTER — Other Ambulatory Visit: Payer: Self-pay | Admitting: Cardiology

## 2019-04-29 ENCOUNTER — Other Ambulatory Visit: Payer: Self-pay | Admitting: Internal Medicine

## 2019-04-29 DIAGNOSIS — I10 Essential (primary) hypertension: Secondary | ICD-10-CM

## 2019-04-29 NOTE — Telephone Encounter (Signed)
Spoke with pt who states that she is okay with switching her appt from 12/8 at 3:45 pm with Richardson Dopp PA-C to 12/9 at 12:15 pm due to a schedule conflict. I informed the pt that we are not allowing any visitors at this time and that she is to wear a mask. Pt verblized understanding and thanked me for the call

## 2019-05-05 ENCOUNTER — Ambulatory Visit: Payer: Medicare Other | Admitting: Physician Assistant

## 2019-05-06 ENCOUNTER — Encounter: Payer: Self-pay | Admitting: Physician Assistant

## 2019-05-06 ENCOUNTER — Ambulatory Visit (INDEPENDENT_AMBULATORY_CARE_PROVIDER_SITE_OTHER): Payer: Medicare Other | Admitting: Physician Assistant

## 2019-05-06 ENCOUNTER — Other Ambulatory Visit: Payer: Self-pay

## 2019-05-06 VITALS — BP 134/76 | HR 71 | Ht 65.0 in | Wt 223.0 lb

## 2019-05-06 DIAGNOSIS — I10 Essential (primary) hypertension: Secondary | ICD-10-CM

## 2019-05-06 DIAGNOSIS — I2510 Atherosclerotic heart disease of native coronary artery without angina pectoris: Secondary | ICD-10-CM

## 2019-05-06 DIAGNOSIS — E782 Mixed hyperlipidemia: Secondary | ICD-10-CM | POA: Diagnosis not present

## 2019-05-06 DIAGNOSIS — R0602 Shortness of breath: Secondary | ICD-10-CM | POA: Diagnosis not present

## 2019-05-06 DIAGNOSIS — I5042 Chronic combined systolic (congestive) and diastolic (congestive) heart failure: Secondary | ICD-10-CM | POA: Diagnosis not present

## 2019-05-06 MED ORDER — CARVEDILOL 6.25 MG PO TABS: 9.3750 mg | ORAL_TABLET | Freq: Two times a day (BID) | ORAL | 2 refills | Status: DC

## 2019-05-06 NOTE — Progress Notes (Signed)
Cardiology Office Note:    Date:  05/06/2019   ID:  Emily Wheeler, DOB 05-12-1956, MRN [id number]  PCP:  Emily Lima, MD  Cardiologist:  Emily Dawley, MD  Electrophysiologist:  None   Referring MD: Emily Lima, MD   Chief Complaint  Patient presents with  . Follow-up    CHF  . Shortness of Breath    History of Present Illness:    Emily Wheeler is a 63 y.o. female with:   Chronic systolic CHF  Nonischemic cardiomyopathy  03/2016: EF 35-40  03/2018: EF 35-40  Coronary artery disease  Mild to moderate nonobstructive disease by cath 2016  Diabetes mellitus  Rheumatoid arthritis  Hypertension  Hyperlipidemia  [name] was last seen in 02/2018.  She returns for follow-up.  She is here alone.  She has noted worsening shortness of breath with exertion of the past 6 months.  She has not had chest pain, syncope, orthopnea, PND or significant leg swelling.   Prior CV studies:   The following studies were reviewed today:  Echocardiogram 04/02/2018 EF 35-40, moderate diffuse HK, grade 1 diastolic dysfunction, mildly calcified aortic valve leaflets, atrial septal lipomatous hypertrophy, trivial PI  ABIs 10/31/2016 Normal   Echocardiogram 04/10/2016 EF 35-40  Cardiac catheterization 10/28/2014 LAD mild diffuse disease RI irregularities LCx ostial 50, mid 25 RCA mild disease; RPDA 60 (small vessel) EF 35-45, LVEDP 18  Past Medical History:  Diagnosis Date  . Anemia    mild  . Anxiety   . Arthritis   . Asthma   . Cardiomyopathy ([place])   . CHF (congestive heart failure) ([place])   . Diabetes mellitus without complication ([place])   . Frequency of urination    at night  . GERD (gastroesophageal reflux disease)    potassium worsens reflux  . Hemorrhoids, external   . Hyperlipidemia   . Hypertension   . Neuromuscular disorder ([facility])    neuropathy  . Numbness and tingling of both legs   . Rheumatoid arthritis ([place])   . Vertigo    hx of    Surgical Hx: The patient  has a past surgical history that includes Tubal ligation; Abdominal hysterectomy; Carpal tunnel release (Left); Cyst removal neck; Colonoscopy w/ polypectomy; Hemorroidectomy; Maximum access (mas)posterior lumbar interbody fusion (plif) 3 level (N/A, 10/08/2013); Cardiac catheterization (N/A, 10/28/2014); and Colonoscopy.   Current Medications: Current Meds  Medication Sig  . Accu-Chek FastClix Lancets MISC Inject 1 Act into the skin 3 (three) times daily.  . Albuterol Sulfate (PROAIR RESPICLICK) 491 (90 Base) MCG/ACT AEPB Inhale 2 puffs into the lungs 4 (four) times daily as needed.  Marland Kitchen allopurinol (ZYLOPRIM) 300 MG tablet Take 1 tablet (300 mg total) by mouth daily.  Marland Kitchen aspirin EC 81 MG tablet Take 1 tablet (81 mg total) by mouth daily.  Marland Kitchen atorvastatin (LIPITOR) 40 MG tablet Take 1 tablet (40 mg total) by mouth daily.  . Blood Glucose Monitoring Suppl (ACCU-CHEK GUIDE) w/Device KIT 1 Act by Does not apply route 3 (three) times daily.  . Calcium Carb-Cholecalciferol (CALCIUM 600 + D PO) Take 1 tablet by mouth daily.  . carvedilol (COREG) 6.25 MG tablet Take 1.5 tablets (9.375 mg total) by mouth 2 (two) times daily with a meal.  . citalopram (CELEXA) 20 MG tablet Take 1 tablet by mouth once daily  . cyanocobalamin 2000 MCG tablet Take 1 tablet (2,000 mcg total) by mouth daily.  Marland Kitchen DEXILANT 60 MG capsule Take 1 capsule by mouth once daily  .  ENTRESTO 97-103 MG Take 1 tablet by mouth twice daily  . folic acid (FOLVITE) 1 MG tablet TAKE TWO (2) TABLETS (2 MG TOTAL)  BY MOUTH DAILY  . gabapentin (NEURONTIN) 600 MG tablet TAKE 1 TABLET BY MOUTH THREE TIMES DAILY  . glucose blood (ACCU-CHEK GUIDE) test strip Use TID  . Lancets Misc. (ACCU-CHEK FASTCLIX LANCET) KIT Inject 1 Act into the skin 3 (three) times daily.  Marland Kitchen leflunomide (ARAVA) 20 MG tablet Take 1 tablet by mouth once daily  . metFORMIN (GLUCOPHAGE) 500 MG tablet TAKE 1 TABLET BY MOUTH TWICE DAILY WITH A MEAL  . Omega-3  Fatty Acids (FISH OIL) 1200 MG CAPS Take 1,200 mg by mouth daily.   Marland Kitchen spironolactone (ALDACTONE) 25 MG tablet Take 1 tablet (25 mg total) by mouth daily. Please keep upcoming appt in December for future refills. Thank you  . vitamin C (ASCORBIC ACID) 500 MG tablet Take 500 mg by mouth daily as needed (for cold symptoms).  . [DISCONTINUED] carvedilol (COREG) 6.25 MG tablet TAKE 1 TABLET BY MOUTH TWICE DAILY WITH A MEAL     Allergies:   Lisinopril   Social History   Tobacco Use  . Smoking status: Former Research scientist (life sciences)  . Smokeless tobacco: Never Used  Substance Use Topics  . Alcohol use: Yes    Comment: 3 x a year -rare   . Drug use: No    Types: Marijuana     Family Hx: The patient's family history includes Colon cancer in her maternal grandmother; Colon polyps in her mother; Diabetes in her brother; Early death in her father; Hashimoto's thyroiditis in her sister; Heart disease in her father; Hypertension in her brother and sister; Non-Hodgkin's lymphoma in her sister; Stomach cancer in her maternal aunt. There is no history of Alcohol abuse, COPD, Depression, Drug abuse, Hearing loss, Hyperlipidemia, Kidney disease, Stroke, Esophageal cancer, or Rectal cancer.  ROS:   Please see the history of present illness.    ROS All other systems reviewed and are negative.   EKGs/Labs/Other Test Reviewed:    EKG:  EKG is  ordered today.  The ekg ordered today demonstrates normal sinus rhythm, heart rate 71, left axis deviation, T wave inversions in 3, aVF, QTC 415, no significant change since prior tracings  Recent Labs: 07/16/2018: ALT 14; TSH 1.46 02/03/2019: BUN 18; Creatinine, Ser 1.04; Hemoglobin 10.4; Platelets 228.0; Potassium 4.6; Sodium 141   Recent Lipid Panel Lab Results  Component Value Date/Time   CHOL 140 07/16/2018 04:34 PM   TRIG 185.0 (H) 07/16/2018 04:34 PM   HDL 44.20 07/16/2018 04:34 PM   CHOLHDL 3 07/16/2018 04:34 PM   LDLCALC 59 07/16/2018 04:34 PM   LDLDIRECT 162.5  01/30/2013 04:02 PM    Physical Exam:    VS:  BP 134/76   Pulse 71   Ht 5' 5"  (1.651 m)   Wt 223 lb (101.2 kg)   LMP 05/29/1991   BMI 37.11 kg/m     Wt Readings from Last 3 Encounters:  05/06/19 223 lb (101.2 kg)  02/03/19 215 lb 12 oz (97.9 kg)  10/24/18 210 lb (95.3 kg)     Physical Exam  Constitutional: She is oriented to person, place, and time. She appears well-developed and well-nourished. No distress.  HENT:  Head: Normocephalic and atraumatic.  Eyes: No scleral icterus.  Neck: No JVD present. No thyromegaly present.  Cardiovascular: Normal rate, regular rhythm and normal heart sounds.  No murmur heard. Pulmonary/Chest: Effort normal and breath sounds normal. She  has no rales.  Abdominal: Soft. There is no hepatomegaly.  Musculoskeletal:        General: No edema.  Lymphadenopathy:    She has no cervical adenopathy.  Neurological: She is alert and oriented to person, place, and time.  Skin: Skin is warm and dry.  Psychiatric: She has a normal mood and affect.    ASSESSMENT & PLAN:    1. Chronic combined systolic and diastolic heart failure ([place]) 2. SOB (shortness of breath) EF 35-40 by echocardiogram November 2019.  She has recently noted shortness of breath with activity.  She probably describes NYHA 2b-3a symptoms.  She does note significant back pain and this may be contributing.  She does not look particularly volume overloaded on exam.  Her blood pressure is somewhat borderline.  I have suggested that we continue to maximize her medical therapy.  -Obtain BMET, BNP  -If BNP significantly elevated, start furosemide and arrange earlier follow-up   -If shortness of breath continues to worsen, consider repeat echocardiogram  -Increase carvedilol to 9.375 mg twice daily  -Continue current dose of Entresto, spironolactone  -Low-salt diet information  3. Essential hypertension, benign Blood pressure borderline.  Adjust carvedilol as noted.  4. Mixed  hyperlipidemia LDL optimal on most recent lab work.  Continue current Rx.     5. Coronary artery disease involving native coronary artery of native heart without angina pectoris Mild to moderate nonobstructive coronary artery disease by cardiac catheterization in 2016.  She does note dyspnea with exertion.  This has gradually worsened over approximately 6 months.  ECG is unchanged.  She is not having any chest pain.  At this point, I am not convinced that she needs an ischemic evaluation.  If her BNP is normal and she continues to have worsening shortness of breath, consider stress testing.   Dispo:  Return in about 6 months (around 11/04/2019) for Routine Follow Up w/ Dr. Meda Coffee, or Richardson Dopp, PA-C, in person.   Medication Adjustments/Labs and Tests Ordered: Current medicines are reviewed at length with the patient today.  Concerns regarding medicines are outlined above.  Tests Ordered: Orders Placed This Encounter  Procedures  . Basic metabolic panel  . Pro b natriuretic peptide  . EKG 12-Lead   Medication Changes: Meds ordered this encounter  Medications  . carvedilol (COREG) 6.25 MG tablet    Sig: Take 1.5 tablets (9.375 mg total) by mouth 2 (two) times daily with a meal.    Dispense:  270 tablet    Refill:  2    Signed, Richardson Dopp, PA-C  05/06/2019 1:07 PM    [facility] [address], [place], [place]  [address] Phone: [phone]; Fax: [phone]

## 2019-05-06 NOTE — Patient Instructions (Addendum)
Medication Instructions:   STAR TAKING CARVEDILOL 9.375 MG TWICE A DAY   *If you need a refill on your cardiac medications before your next appointment, please call your pharmacy*  Lab Work: BMET AND BNP TODAY    If you have labs (blood work) drawn today and your tests are completely normal, you will receive your results only by: Marland Kitchen MyChart Message (if you have MyChart) OR . A paper copy in the mail If you have any lab test that is abnormal or we need to change your treatment, we will call you to review the results.  Testing/Procedures: NONE ORDERED  TODAY  Follow-Up: At [facility], you and your health needs are our priority.  As part of our continuing mission to provide you with exceptional heart care, we have created designated Provider Care Teams.  These Care Teams include your primary Cardiologist (physician) and Advanced Practice Providers (APPs -  Physician Assistants and Nurse Practitioners) who all work together to provide you with the care you need, when you need it.  Your next appointment:   6 month(s)  The format for your next appointment:   In Person  Provider:   You may see Ena Dawley, MD or one of the following Advanced Practice Providers on your designated Care Team:    Melina Copa, PA-C  Ermalinda Barrios, PA-C   Other Instructions   Low-Sodium Eating Plan Sodium, which is an element that makes up salt, helps you maintain a healthy balance of fluids in your body. Too much sodium can increase your blood pressure and cause fluid and waste to be held in your body. Your health care provider or dietitian may recommend following this plan if you have high blood pressure (hypertension), kidney disease, liver disease, or heart failure. Eating less sodium can help lower your blood pressure, reduce swelling, and protect your heart, liver, and kidneys. What are tips for following this plan? General guidelines  Most people on this plan should limit their sodium intake  to 1,500-2,000 mg (milligrams) of sodium each day. Reading food labels   The Nutrition Facts label lists the amount of sodium in one serving of the food. If you eat more than one serving, you must multiply the listed amount of sodium by the number of servings.  Choose foods with less than 140 mg of sodium per serving.  Avoid foods with 300 mg of sodium or more per serving. Shopping  Look for lower-sodium products, often labeled as "low-sodium" or "no salt added."  Always check the sodium content even if foods are labeled as "unsalted" or "no salt added".  Buy fresh foods. ? Avoid canned foods and premade or frozen meals. ? Avoid canned, cured, or processed meats  Buy breads that have less than 80 mg of sodium per slice. Cooking  Eat more home-cooked food and less restaurant, buffet, and fast food.  Avoid adding salt when cooking. Use salt-free seasonings or herbs instead of table salt or sea salt. Check with your health care provider or pharmacist before using salt substitutes.  Cook with plant-based oils, such as canola, sunflower, or olive oil. Meal planning  When eating at a restaurant, ask that your food be prepared with less salt or no salt, if possible.  Avoid foods that contain MSG (monosodium glutamate). MSG is sometimes added to [place] food, bouillon, and some canned foods. What foods are recommended? The items listed may not be a complete list. Talk with your dietitian about what dietary choices are best for you.  Grains Low-sodium cereals, including oats, puffed wheat and rice, and shredded wheat. Low-sodium crackers. Unsalted rice. Unsalted pasta. Low-sodium bread. Whole-grain breads and whole-grain pasta. Vegetables Fresh or frozen vegetables. "No salt added" canned vegetables. "No salt added" tomato sauce and paste. Low-sodium or reduced-sodium tomato and vegetable juice. Fruits Fresh, frozen, or canned fruit. Fruit juice. Meats and other protein foods Fresh or  frozen (no salt added) meat, poultry, seafood, and fish. Low-sodium canned tuna and salmon. Unsalted nuts. Dried peas, beans, and lentils without added salt. Unsalted canned beans. Eggs. Unsalted nut butters. Dairy Milk. Soy milk. Cheese that is naturally low in sodium, such as ricotta cheese, fresh mozzarella, or Swiss cheese Low-sodium or reduced-sodium cheese. Cream cheese. Yogurt. Fats and oils Unsalted butter. Unsalted margarine with no trans fat. Vegetable oils such as canola or olive oils. Seasonings and other foods Fresh and dried herbs and spices. Salt-free seasonings. Low-sodium mustard and ketchup. Sodium-free salad dressing. Sodium-free light mayonnaise. Fresh or refrigerated horseradish. Lemon juice. Vinegar. Homemade, reduced-sodium, or low-sodium soups. Unsalted popcorn and pretzels. Low-salt or salt-free chips. What foods are not recommended? The items listed may not be a complete list. Talk with your dietitian about what dietary choices are best for you. Grains Instant hot cereals. Bread stuffing, pancake, and biscuit mixes. Croutons. Seasoned rice or pasta mixes. Noodle soup cups. Boxed or frozen macaroni and cheese. Regular salted crackers. Self-rising flour. Vegetables Sauerkraut, pickled vegetables, and relishes. Olives. [place] fries. Onion rings. Regular canned vegetables (not low-sodium or reduced-sodium). Regular canned tomato sauce and paste (not low-sodium or reduced-sodium). Regular tomato and vegetable juice (not low-sodium or reduced-sodium). Frozen vegetables in sauces. Meats and other protein foods Meat or fish that is salted, canned, smoked, spiced, or pickled. Bacon, ham, sausage, hotdogs, corned beef, chipped beef, packaged lunch meats, salt pork, jerky, pickled herring, anchovies, regular canned tuna, sardines, salted nuts. Dairy Processed cheese and cheese spreads. Cheese curds. Blue cheese. Feta cheese. String cheese. Regular cottage cheese. Buttermilk. Canned  milk. Fats and oils Salted butter. Regular margarine. Ghee. Bacon fat. Seasonings and other foods Onion salt, garlic salt, seasoned salt, table salt, and sea salt. Canned and packaged gravies. Worcestershire sauce. Tartar sauce. Barbecue sauce. Teriyaki sauce. Soy sauce, including reduced-sodium. Steak sauce. Fish sauce. Oyster sauce. Cocktail sauce. Horseradish that you find on the shelf. Regular ketchup and mustard. Meat flavorings and tenderizers. Bouillon cubes. Hot sauce and Tabasco sauce. Premade or packaged marinades. Premade or packaged taco seasonings. Relishes. Regular salad dressings. Salsa. Potato and tortilla chips. Corn chips and puffs. Salted popcorn and pretzels. Canned or dried soups. Pizza. Frozen entrees and pot pies. Summary  Eating less sodium can help lower your blood pressure, reduce swelling, and protect your heart, liver, and kidneys.  Most people on this plan should limit their sodium intake to 1,500-2,000 mg (milligrams) of sodium each day.  Canned, boxed, and frozen foods are high in sodium. Restaurant foods, fast foods, and pizza are also very high in sodium. You also get sodium by adding salt to food.  Try to cook at home, eat more fresh fruits and vegetables, and eat less fast food, canned, processed, or prepared foods. This information is not intended to replace advice given to you by your health care provider. Make sure you discuss any questions you have with your health care provider. Document Released: 11/03/2001 Document Revised: 04/26/2017 Document Reviewed: 05/07/2016 Elsevier Patient Education  2020 [employer].

## 2019-05-07 ENCOUNTER — Telehealth: Payer: Self-pay | Admitting: *Deleted

## 2019-05-07 DIAGNOSIS — I5042 Chronic combined systolic (congestive) and diastolic (congestive) heart failure: Secondary | ICD-10-CM

## 2019-05-07 LAB — BASIC METABOLIC PANEL
BUN/Creatinine Ratio: 13 (ref 12–28)
BUN: 13 mg/dL (ref 8–27)
CO2: 24 mmol/L (ref 20–29)
Calcium: 9.2 mg/dL (ref 8.7–10.3)
Chloride: 105 mmol/L (ref 96–106)
Creatinine, Ser: 1.02 mg/dL — ABNORMAL HIGH (ref 0.57–1.00)
GFR calc Af Amer: 67 mL/min/{1.73_m2} (ref >59)
GFR calc non Af Amer: 58 mL/min/{1.73_m2} — ABNORMAL LOW (ref >59)
Glucose: 108 mg/dL — ABNORMAL HIGH (ref 65–99)
Potassium: 5.4 mmol/L — ABNORMAL HIGH (ref 3.5–5.2)
Sodium: 142 mmol/L (ref 134–144)

## 2019-05-07 LAB — PRO B NATRIURETIC PEPTIDE: NT-Pro BNP: 56 pg/mL (ref 0–301)

## 2019-05-07 NOTE — Telephone Encounter (Signed)
Pt has been notified of lab results by phone with verbal understanding. Pt is agreeable to plan of care for bmet to be done in 1 week, 05/14/19. Pt advised to limit K+ rich foods. Pt states she has been having a banana almost every morning. I did ask her to limit the banana's. Pt is agreeable and thanked me for the call. Patient notified of result.  Please refer to phone note from today for complete details.   Julaine Hua, [place] 05/07/2019 12:53 PM

## 2019-05-13 ENCOUNTER — Ambulatory Visit
Admission: RE | Admit: 2019-05-13 | Discharge: 2019-05-13 | Disposition: A | Discharge status: Home / Self Care | Payer: Medicare Other | Source: Ambulatory Visit | Attending: Internal Medicine | Admitting: Internal Medicine

## 2019-05-13 ENCOUNTER — Other Ambulatory Visit: Payer: Self-pay

## 2019-05-13 DIAGNOSIS — Z1231 Encounter for screening mammogram for malignant neoplasm of breast: Secondary | ICD-10-CM

## 2019-05-13 LAB — HM MAMMOGRAPHY

## 2019-05-14 ENCOUNTER — Other Ambulatory Visit: Payer: Medicare Other | Admitting: *Deleted

## 2019-05-14 ENCOUNTER — Telehealth: Payer: Self-pay

## 2019-05-14 DIAGNOSIS — I5042 Chronic combined systolic (congestive) and diastolic (congestive) heart failure: Secondary | ICD-10-CM

## 2019-05-14 LAB — BASIC METABOLIC PANEL
BUN/Creatinine Ratio: 15 (ref 12–28)
BUN: 15 mg/dL (ref 8–27)
CO2: 20 mmol/L (ref 20–29)
Calcium: 8.6 mg/dL — ABNORMAL LOW (ref 8.7–10.3)
Chloride: 103 mmol/L (ref 96–106)
Creatinine, Ser: 1.02 mg/dL — ABNORMAL HIGH (ref 0.57–1.00)
GFR calc Af Amer: 67 mL/min/{1.73_m2} (ref >59)
GFR calc non Af Amer: 58 mL/min/{1.73_m2} — ABNORMAL LOW (ref >59)
Glucose: 135 mg/dL — ABNORMAL HIGH (ref 65–99)
Potassium: 4.7 mmol/L (ref 3.5–5.2)
Sodium: 141 mmol/L (ref 134–144)

## 2019-05-14 NOTE — Telephone Encounter (Signed)
The patient has been notified of the result and verbalized understanding.  All questions (if any) were answered. Frederik Schmidt, RN 05/14/2019 4:47 PM

## 2019-05-20 ENCOUNTER — Other Ambulatory Visit: Payer: Self-pay | Admitting: Internal Medicine

## 2019-05-20 DIAGNOSIS — K219 Gastro-esophageal reflux disease without esophagitis: Secondary | ICD-10-CM

## 2019-06-15 ENCOUNTER — Encounter: Payer: Self-pay | Admitting: Internal Medicine

## 2019-06-18 ENCOUNTER — Other Ambulatory Visit: Payer: Self-pay | Admitting: Family Medicine

## 2019-06-25 ENCOUNTER — Other Ambulatory Visit: Payer: Self-pay | Admitting: Internal Medicine

## 2019-06-25 DIAGNOSIS — M48061 Spinal stenosis, lumbar region without neurogenic claudication: Secondary | ICD-10-CM

## 2019-06-29 ENCOUNTER — Telehealth: Payer: Self-pay | Admitting: Internal Medicine

## 2019-06-29 NOTE — Telephone Encounter (Signed)
Spoke with patient. She has been seen at [facility] she will call to self schedule

## 2019-06-29 NOTE — Telephone Encounter (Signed)
Patient is calling to follow up on the letter she received from an referral about sitting up a eye exam.   Please follow up with patient. Thank you.

## 2019-06-30 DIAGNOSIS — E119 Type 2 diabetes mellitus without complications: Secondary | ICD-10-CM | POA: Diagnosis not present

## 2019-06-30 DIAGNOSIS — H524 Presbyopia: Secondary | ICD-10-CM | POA: Diagnosis not present

## 2019-06-30 LAB — HM DIABETES EYE EXAM

## 2019-07-08 ENCOUNTER — Other Ambulatory Visit: Payer: Self-pay | Admitting: Internal Medicine

## 2019-07-08 DIAGNOSIS — E118 Type 2 diabetes mellitus with unspecified complications: Secondary | ICD-10-CM

## 2019-07-20 ENCOUNTER — Other Ambulatory Visit: Payer: Self-pay | Admitting: Family Medicine

## 2019-07-20 ENCOUNTER — Other Ambulatory Visit: Payer: Self-pay | Admitting: Cardiology

## 2019-07-22 ENCOUNTER — Encounter: Payer: Self-pay | Admitting: Internal Medicine

## 2019-07-22 DIAGNOSIS — M1610 Unilateral primary osteoarthritis, unspecified hip: Secondary | ICD-10-CM | POA: Diagnosis not present

## 2019-07-22 DIAGNOSIS — M4010 Other secondary kyphosis, site unspecified: Secondary | ICD-10-CM | POA: Diagnosis not present

## 2019-07-22 DIAGNOSIS — M7062 Trochanteric bursitis, left hip: Secondary | ICD-10-CM | POA: Diagnosis not present

## 2019-07-22 DIAGNOSIS — M545 Low back pain: Secondary | ICD-10-CM | POA: Diagnosis not present

## 2019-07-22 DIAGNOSIS — M4326 Fusion of spine, lumbar region: Secondary | ICD-10-CM | POA: Diagnosis not present

## 2019-07-24 ENCOUNTER — Encounter: Payer: Self-pay | Admitting: Internal Medicine

## 2019-07-29 DIAGNOSIS — M4326 Fusion of spine, lumbar region: Secondary | ICD-10-CM | POA: Diagnosis not present

## 2019-07-29 DIAGNOSIS — M6281 Muscle weakness (generalized): Secondary | ICD-10-CM | POA: Diagnosis not present

## 2019-07-29 DIAGNOSIS — G894 Chronic pain syndrome: Secondary | ICD-10-CM | POA: Diagnosis not present

## 2019-08-03 ENCOUNTER — Other Ambulatory Visit: Payer: Self-pay | Admitting: Cardiology

## 2019-08-03 ENCOUNTER — Other Ambulatory Visit: Payer: Self-pay | Admitting: Internal Medicine

## 2019-08-03 DIAGNOSIS — I10 Essential (primary) hypertension: Secondary | ICD-10-CM

## 2019-08-03 DIAGNOSIS — M069 Rheumatoid arthritis, unspecified: Secondary | ICD-10-CM

## 2019-08-04 DIAGNOSIS — M6281 Muscle weakness (generalized): Secondary | ICD-10-CM | POA: Diagnosis not present

## 2019-08-04 DIAGNOSIS — G894 Chronic pain syndrome: Secondary | ICD-10-CM | POA: Diagnosis not present

## 2019-08-04 DIAGNOSIS — M4326 Fusion of spine, lumbar region: Secondary | ICD-10-CM | POA: Diagnosis not present

## 2019-08-06 DIAGNOSIS — G894 Chronic pain syndrome: Secondary | ICD-10-CM | POA: Diagnosis not present

## 2019-08-06 DIAGNOSIS — M6281 Muscle weakness (generalized): Secondary | ICD-10-CM | POA: Diagnosis not present

## 2019-08-06 DIAGNOSIS — M4326 Fusion of spine, lumbar region: Secondary | ICD-10-CM | POA: Diagnosis not present

## 2019-08-08 ENCOUNTER — Ambulatory Visit: Payer: Medicare Other | Attending: Internal Medicine

## 2019-08-08 DIAGNOSIS — Z23 Encounter for immunization: Secondary | ICD-10-CM

## 2019-08-08 NOTE — Progress Notes (Signed)
   Covid-19 Vaccination Clinic  Name:  Emily Wheeler    MRN: [id number] DOB: Oct 14, 1955  08/08/2019  Emily Wheeler was observed post Covid-19 immunization for 30 minutes based on pre-vaccination screening without incident. She was provided with Vaccine Information Sheet and instruction to access the V-Safe system.   Emily Wheeler was instructed to call 911 with any severe reactions post vaccine: Marland Kitchen Difficulty breathing  . Swelling of face and throat  . A fast heartbeat  . A bad rash all over body  . Dizziness and weakness   Immunizations Administered    Name Date Dose VIS Date Route   Pfizer COVID-19 Vaccine 08/08/2019 10:47 AM 0.3 mL 05/08/2019 Intramuscular   Manufacturer: [employer], [employer]   Lot: [id number]   NDC: [address]-8

## 2019-08-11 DIAGNOSIS — M4326 Fusion of spine, lumbar region: Secondary | ICD-10-CM | POA: Diagnosis not present

## 2019-08-11 DIAGNOSIS — G894 Chronic pain syndrome: Secondary | ICD-10-CM | POA: Diagnosis not present

## 2019-08-11 DIAGNOSIS — M6281 Muscle weakness (generalized): Secondary | ICD-10-CM | POA: Diagnosis not present

## 2019-08-18 DIAGNOSIS — G894 Chronic pain syndrome: Secondary | ICD-10-CM | POA: Diagnosis not present

## 2019-08-18 DIAGNOSIS — M4326 Fusion of spine, lumbar region: Secondary | ICD-10-CM | POA: Diagnosis not present

## 2019-08-18 DIAGNOSIS — M6281 Muscle weakness (generalized): Secondary | ICD-10-CM | POA: Diagnosis not present

## 2019-08-19 ENCOUNTER — Other Ambulatory Visit: Payer: Self-pay | Admitting: Family Medicine

## 2019-08-19 ENCOUNTER — Other Ambulatory Visit: Payer: Self-pay | Admitting: Internal Medicine

## 2019-08-19 DIAGNOSIS — E118 Type 2 diabetes mellitus with unspecified complications: Secondary | ICD-10-CM

## 2019-08-19 DIAGNOSIS — E785 Hyperlipidemia, unspecified: Secondary | ICD-10-CM

## 2019-08-20 DIAGNOSIS — G894 Chronic pain syndrome: Secondary | ICD-10-CM | POA: Diagnosis not present

## 2019-08-20 DIAGNOSIS — M4326 Fusion of spine, lumbar region: Secondary | ICD-10-CM | POA: Diagnosis not present

## 2019-08-20 DIAGNOSIS — M6281 Muscle weakness (generalized): Secondary | ICD-10-CM | POA: Diagnosis not present

## 2019-08-25 DIAGNOSIS — G894 Chronic pain syndrome: Secondary | ICD-10-CM | POA: Diagnosis not present

## 2019-08-25 DIAGNOSIS — M6281 Muscle weakness (generalized): Secondary | ICD-10-CM | POA: Diagnosis not present

## 2019-08-25 DIAGNOSIS — M4326 Fusion of spine, lumbar region: Secondary | ICD-10-CM | POA: Diagnosis not present

## 2019-08-27 DIAGNOSIS — G894 Chronic pain syndrome: Secondary | ICD-10-CM | POA: Diagnosis not present

## 2019-08-27 DIAGNOSIS — M4326 Fusion of spine, lumbar region: Secondary | ICD-10-CM | POA: Diagnosis not present

## 2019-08-27 DIAGNOSIS — M6281 Muscle weakness (generalized): Secondary | ICD-10-CM | POA: Diagnosis not present

## 2019-09-02 ENCOUNTER — Ambulatory Visit: Payer: Medicare Other | Attending: Internal Medicine

## 2019-09-02 DIAGNOSIS — Z23 Encounter for immunization: Secondary | ICD-10-CM

## 2019-09-02 NOTE — Progress Notes (Signed)
   Covid-19 Vaccination Clinic  Name:  [name]    MRN: [id number] DOB: 1955/09/14  09/02/2019  [name] was observed post Covid-19 immunization for 15 minutes without incident. She was provided with Vaccine Information Sheet and instruction to access the V-Safe system.   [name] was instructed to call 911 with any severe reactions post vaccine: Marland Kitchen Difficulty breathing  . Swelling of face and throat  . A fast heartbeat  . A bad rash all over body  . Dizziness and weakness   Immunizations Administered    Name Date Dose VIS Date Route   Pfizer COVID-19 Vaccine 09/02/2019  9:29 AM 0.3 mL 05/08/2019 Intramuscular   Manufacturer: [employer], [employer]   Lot: [id number]   NDC: [address]-4

## 2019-09-03 DIAGNOSIS — M6281 Muscle weakness (generalized): Secondary | ICD-10-CM | POA: Diagnosis not present

## 2019-09-03 DIAGNOSIS — M4326 Fusion of spine, lumbar region: Secondary | ICD-10-CM | POA: Diagnosis not present

## 2019-09-03 DIAGNOSIS — G894 Chronic pain syndrome: Secondary | ICD-10-CM | POA: Diagnosis not present

## 2019-09-08 DIAGNOSIS — M4326 Fusion of spine, lumbar region: Secondary | ICD-10-CM | POA: Diagnosis not present

## 2019-09-08 DIAGNOSIS — G894 Chronic pain syndrome: Secondary | ICD-10-CM | POA: Diagnosis not present

## 2019-09-08 DIAGNOSIS — M6281 Muscle weakness (generalized): Secondary | ICD-10-CM | POA: Diagnosis not present

## 2019-09-10 DIAGNOSIS — G894 Chronic pain syndrome: Secondary | ICD-10-CM | POA: Diagnosis not present

## 2019-09-10 DIAGNOSIS — M6281 Muscle weakness (generalized): Secondary | ICD-10-CM | POA: Diagnosis not present

## 2019-09-10 DIAGNOSIS — M4326 Fusion of spine, lumbar region: Secondary | ICD-10-CM | POA: Diagnosis not present

## 2019-09-15 DIAGNOSIS — M6281 Muscle weakness (generalized): Secondary | ICD-10-CM | POA: Diagnosis not present

## 2019-09-15 DIAGNOSIS — G894 Chronic pain syndrome: Secondary | ICD-10-CM | POA: Diagnosis not present

## 2019-09-15 DIAGNOSIS — M4326 Fusion of spine, lumbar region: Secondary | ICD-10-CM | POA: Diagnosis not present

## 2019-09-17 DIAGNOSIS — M6281 Muscle weakness (generalized): Secondary | ICD-10-CM | POA: Diagnosis not present

## 2019-09-17 DIAGNOSIS — M4326 Fusion of spine, lumbar region: Secondary | ICD-10-CM | POA: Diagnosis not present

## 2019-09-17 DIAGNOSIS — G894 Chronic pain syndrome: Secondary | ICD-10-CM | POA: Diagnosis not present

## 2019-09-21 ENCOUNTER — Other Ambulatory Visit: Payer: Self-pay | Admitting: Family Medicine

## 2019-09-22 ENCOUNTER — Other Ambulatory Visit: Payer: Self-pay

## 2019-09-22 DIAGNOSIS — M6281 Muscle weakness (generalized): Secondary | ICD-10-CM | POA: Diagnosis not present

## 2019-09-22 DIAGNOSIS — G894 Chronic pain syndrome: Secondary | ICD-10-CM | POA: Diagnosis not present

## 2019-09-22 DIAGNOSIS — M4326 Fusion of spine, lumbar region: Secondary | ICD-10-CM | POA: Diagnosis not present

## 2019-09-22 MED ORDER — ALLOPURINOL 300 MG PO TABS: 300.0000 mg | ORAL_TABLET | Freq: Every day | ORAL | 0 refills | Status: DC

## 2019-09-24 DIAGNOSIS — G894 Chronic pain syndrome: Secondary | ICD-10-CM | POA: Diagnosis not present

## 2019-09-24 DIAGNOSIS — M4326 Fusion of spine, lumbar region: Secondary | ICD-10-CM | POA: Diagnosis not present

## 2019-09-24 DIAGNOSIS — M6281 Muscle weakness (generalized): Secondary | ICD-10-CM | POA: Diagnosis not present

## 2019-09-29 DIAGNOSIS — G894 Chronic pain syndrome: Secondary | ICD-10-CM | POA: Diagnosis not present

## 2019-09-29 DIAGNOSIS — M6281 Muscle weakness (generalized): Secondary | ICD-10-CM | POA: Diagnosis not present

## 2019-09-29 DIAGNOSIS — M4326 Fusion of spine, lumbar region: Secondary | ICD-10-CM | POA: Diagnosis not present

## 2019-09-30 ENCOUNTER — Other Ambulatory Visit: Payer: Self-pay | Admitting: Internal Medicine

## 2019-09-30 DIAGNOSIS — M48061 Spinal stenosis, lumbar region without neurogenic claudication: Secondary | ICD-10-CM

## 2019-10-01 DIAGNOSIS — G894 Chronic pain syndrome: Secondary | ICD-10-CM | POA: Diagnosis not present

## 2019-10-01 DIAGNOSIS — M4326 Fusion of spine, lumbar region: Secondary | ICD-10-CM | POA: Diagnosis not present

## 2019-10-01 DIAGNOSIS — M6281 Muscle weakness (generalized): Secondary | ICD-10-CM | POA: Diagnosis not present

## 2019-10-06 DIAGNOSIS — M6281 Muscle weakness (generalized): Secondary | ICD-10-CM | POA: Diagnosis not present

## 2019-10-06 DIAGNOSIS — G894 Chronic pain syndrome: Secondary | ICD-10-CM | POA: Diagnosis not present

## 2019-10-06 DIAGNOSIS — M4326 Fusion of spine, lumbar region: Secondary | ICD-10-CM | POA: Diagnosis not present

## 2019-10-08 DIAGNOSIS — M6281 Muscle weakness (generalized): Secondary | ICD-10-CM | POA: Diagnosis not present

## 2019-10-08 DIAGNOSIS — G894 Chronic pain syndrome: Secondary | ICD-10-CM | POA: Diagnosis not present

## 2019-10-08 DIAGNOSIS — M4326 Fusion of spine, lumbar region: Secondary | ICD-10-CM | POA: Diagnosis not present

## 2019-10-13 DIAGNOSIS — M6281 Muscle weakness (generalized): Secondary | ICD-10-CM | POA: Diagnosis not present

## 2019-10-13 DIAGNOSIS — G894 Chronic pain syndrome: Secondary | ICD-10-CM | POA: Diagnosis not present

## 2019-10-13 DIAGNOSIS — M4326 Fusion of spine, lumbar region: Secondary | ICD-10-CM | POA: Diagnosis not present

## 2019-10-15 ENCOUNTER — Other Ambulatory Visit: Payer: Self-pay | Admitting: Internal Medicine

## 2019-10-15 DIAGNOSIS — G894 Chronic pain syndrome: Secondary | ICD-10-CM | POA: Diagnosis not present

## 2019-10-15 DIAGNOSIS — M6281 Muscle weakness (generalized): Secondary | ICD-10-CM | POA: Diagnosis not present

## 2019-10-15 DIAGNOSIS — E118 Type 2 diabetes mellitus with unspecified complications: Secondary | ICD-10-CM

## 2019-10-15 DIAGNOSIS — M4326 Fusion of spine, lumbar region: Secondary | ICD-10-CM | POA: Diagnosis not present

## 2019-10-15 MED ORDER — METFORMIN HCL 500 MG PO TABS: 500.0000 mg | ORAL_TABLET | Freq: Two times a day (BID) | ORAL | 1 refills | Status: DC

## 2019-11-03 ENCOUNTER — Other Ambulatory Visit: Payer: Self-pay | Admitting: Internal Medicine

## 2019-11-03 DIAGNOSIS — I10 Essential (primary) hypertension: Secondary | ICD-10-CM

## 2019-11-04 ENCOUNTER — Other Ambulatory Visit: Payer: Self-pay | Admitting: Internal Medicine

## 2019-11-04 DIAGNOSIS — M069 Rheumatoid arthritis, unspecified: Secondary | ICD-10-CM

## 2019-11-04 NOTE — Telephone Encounter (Signed)
New message:   1.Medication Requested: ENTRESTO 97-103 MG 2. Pharmacy (Name, Street, [place]): [facility] - [place], [place] - [address] 3. On Med List: yes  4. Last Visit with PCP: 02/03/19  5. Next visit date with PCP: 11/09/19   Agent: Please be advised that RX refills may take up to 3 business days. We ask that you follow-up with your pharmacy.

## 2019-11-05 ENCOUNTER — Other Ambulatory Visit: Payer: Self-pay | Admitting: Internal Medicine

## 2019-11-05 NOTE — Telephone Encounter (Signed)
Pt has scheduled appt for 11/09/2019. Okay to refill? Please advise.

## 2019-11-09 ENCOUNTER — Encounter: Payer: Self-pay | Admitting: Internal Medicine

## 2019-11-09 ENCOUNTER — Encounter: Payer: Self-pay | Admitting: Family Medicine

## 2019-11-09 ENCOUNTER — Ambulatory Visit: Payer: Self-pay

## 2019-11-09 ENCOUNTER — Other Ambulatory Visit: Payer: Self-pay

## 2019-11-09 ENCOUNTER — Ambulatory Visit (INDEPENDENT_AMBULATORY_CARE_PROVIDER_SITE_OTHER): Payer: Medicare Other | Admitting: Family Medicine

## 2019-11-09 ENCOUNTER — Ambulatory Visit (INDEPENDENT_AMBULATORY_CARE_PROVIDER_SITE_OTHER): Payer: Medicare Other | Admitting: Internal Medicine

## 2019-11-09 VITALS — BP 120/70 | Ht 65.0 in | Wt 213.0 lb

## 2019-11-09 VITALS — BP 120/70 | HR 65 | Temp 98.5°F | Resp 16 | Ht 65.0 in | Wt 213.0 lb

## 2019-11-09 DIAGNOSIS — M7061 Trochanteric bursitis, right hip: Secondary | ICD-10-CM

## 2019-11-09 DIAGNOSIS — M25552 Pain in left hip: Secondary | ICD-10-CM | POA: Diagnosis not present

## 2019-11-09 DIAGNOSIS — M069 Rheumatoid arthritis, unspecified: Secondary | ICD-10-CM

## 2019-11-09 DIAGNOSIS — E118 Type 2 diabetes mellitus with unspecified complications: Secondary | ICD-10-CM

## 2019-11-09 DIAGNOSIS — N3281 Overactive bladder: Secondary | ICD-10-CM

## 2019-11-09 DIAGNOSIS — I10 Essential (primary) hypertension: Secondary | ICD-10-CM

## 2019-11-09 DIAGNOSIS — M170 Bilateral primary osteoarthritis of knee: Secondary | ICD-10-CM | POA: Diagnosis not present

## 2019-11-09 DIAGNOSIS — Z0000 Encounter for general adult medical examination without abnormal findings: Secondary | ICD-10-CM

## 2019-11-09 DIAGNOSIS — D638 Anemia in other chronic diseases classified elsewhere: Secondary | ICD-10-CM | POA: Diagnosis not present

## 2019-11-09 DIAGNOSIS — D510 Vitamin B12 deficiency anemia due to intrinsic factor deficiency: Secondary | ICD-10-CM | POA: Diagnosis not present

## 2019-11-09 DIAGNOSIS — M48062 Spinal stenosis, lumbar region with neurogenic claudication: Secondary | ICD-10-CM | POA: Diagnosis not present

## 2019-11-09 DIAGNOSIS — N1831 Chronic kidney disease, stage 3a: Secondary | ICD-10-CM

## 2019-11-09 DIAGNOSIS — E785 Hyperlipidemia, unspecified: Secondary | ICD-10-CM | POA: Diagnosis not present

## 2019-11-09 DIAGNOSIS — M7062 Trochanteric bursitis, left hip: Secondary | ICD-10-CM | POA: Insufficient documentation

## 2019-11-09 LAB — URINALYSIS, ROUTINE W REFLEX MICROSCOPIC
Bilirubin Urine: NEGATIVE
Hgb urine dipstick: NEGATIVE
Ketones, ur: NEGATIVE
Leukocytes,Ua: NEGATIVE
Nitrite: NEGATIVE
RBC / HPF: NONE SEEN (ref 0–?)
Specific Gravity, Urine: 1.02 (ref 1.000–1.030)
Total Protein, Urine: NEGATIVE
Urine Glucose: NEGATIVE
Urobilinogen, UA: 0.2 (ref 0.0–1.0)
pH: 6 (ref 5.0–8.0)

## 2019-11-09 LAB — CBC WITH DIFFERENTIAL/PLATELET
Basophils Absolute: 0 10*3/uL (ref 0.0–0.1)
Basophils Relative: 0.7 % (ref 0.0–3.0)
Eosinophils Absolute: 0.2 10*3/uL (ref 0.0–0.7)
Eosinophils Relative: 5.7 % — ABNORMAL HIGH (ref 0.0–5.0)
HCT: 30 % — ABNORMAL LOW (ref 36.0–46.0)
Hemoglobin: 9.9 g/dL — ABNORMAL LOW (ref 12.0–15.0)
Lymphocytes Relative: 47.7 % — ABNORMAL HIGH (ref 12.0–46.0)
Lymphs Abs: 1.9 10*3/uL (ref 0.7–4.0)
MCHC: 33.1 g/dL (ref 30.0–36.0)
MCV: 99.8 fl (ref 78.0–100.0)
Monocytes Absolute: 0.5 10*3/uL (ref 0.1–1.0)
Monocytes Relative: 12.8 % — ABNORMAL HIGH (ref 3.0–12.0)
Neutro Abs: 1.3 10*3/uL — ABNORMAL LOW (ref 1.4–7.7)
Neutrophils Relative %: 33.1 % — ABNORMAL LOW (ref 43.0–77.0)
Platelets: 220 10*3/uL (ref 150.0–400.0)
RBC: 3.01 Mil/uL — ABNORMAL LOW (ref 3.87–5.11)
RDW: 15.3 % (ref 11.5–15.5)
WBC: 4 10*3/uL (ref 4.0–10.5)

## 2019-11-09 LAB — HEPATIC FUNCTION PANEL
ALT: 12 U/L (ref 0–35)
AST: 15 U/L (ref 0–37)
Albumin: 4.3 g/dL (ref 3.5–5.2)
Alkaline Phosphatase: 68 U/L (ref 39–117)
Bilirubin, Direct: 0 mg/dL (ref 0.0–0.3)
Total Bilirubin: 0.4 mg/dL (ref 0.2–1.2)
Total Protein: 6.7 g/dL (ref 6.0–8.3)

## 2019-11-09 LAB — BASIC METABOLIC PANEL
BUN: 15 mg/dL (ref 6–23)
CO2: 28 mEq/L (ref 19–32)
Calcium: 9.2 mg/dL (ref 8.4–10.5)
Chloride: 106 mEq/L (ref 96–112)
Creatinine, Ser: 1.11 mg/dL (ref 0.40–1.20)
GFR: 59.56 mL/min — ABNORMAL LOW (ref >60.00)
Glucose, Bld: 103 mg/dL — ABNORMAL HIGH (ref 70–99)
Potassium: 4.2 mEq/L (ref 3.5–5.1)
Sodium: 140 mEq/L (ref 135–145)

## 2019-11-09 LAB — MICROALBUMIN / CREATININE URINE RATIO
Creatinine,U: 159.5 mg/dL
Microalb Creat Ratio: 1 mg/g (ref 0.0–30.0)
Microalb, Ur: 1.6 mg/dL (ref 0.0–1.9)

## 2019-11-09 LAB — LIPID PANEL
Cholesterol: 143 mg/dL (ref 0–200)
HDL: 49 mg/dL (ref >39.00)
LDL Cholesterol: 70 mg/dL (ref 0–99)
NonHDL: 93.65
Total CHOL/HDL Ratio: 3
Triglycerides: 120 mg/dL (ref 0.0–149.0)
VLDL: 24 mg/dL (ref 0.0–40.0)

## 2019-11-09 LAB — TSH: TSH: 0.97 u[IU]/mL (ref 0.35–4.50)

## 2019-11-09 LAB — HEMOGLOBIN A1C: Hgb A1c MFr Bld: 6.2 % (ref 4.6–6.5)

## 2019-11-09 LAB — FOLATE: Folate: >24.8 ng/mL (ref >5.9)

## 2019-11-09 LAB — VITAMIN B12: Vitamin B-12: >1526 pg/mL — ABNORMAL HIGH (ref 211–911)

## 2019-11-09 MED ORDER — LEFLUNOMIDE 20 MG PO TABS: 20.0000 mg | ORAL_TABLET | Freq: Every day | ORAL | 1 refills | Status: DC

## 2019-11-09 MED ORDER — MIRABEGRON ER 50 MG PO TB24: 50.0000 mg | ORAL_TABLET | Freq: Every day | ORAL | 0 refills | Status: DC

## 2019-11-09 NOTE — Patient Instructions (Signed)

## 2019-11-09 NOTE — Assessment & Plan Note (Signed)
Patient does have arthritic changes remaining injections at follow-up.

## 2019-11-09 NOTE — Progress Notes (Signed)
[facility] Phone: [phone] Subjective:   [name], am serving as a scribe for Dr. Hulan Saas. This visit occurred during the SARS-CoV-2 public health emergency.  Safety protocols were in place, including screening questions prior to the visit, additional usage of staff PPE, and extensive cleaning of exam room while observing appropriate contact time as indicated for disinfecting solutions.   I'm seeing this patient by the request  of:  [name], MD  CC: Multiple complaints but mostly bilateral hip pain  MEQ:[id number]  Emily Wheeler is a 64 y.o. female coming in with complaint of right hip pain. Patient states that she has been having lower back pain into both glutes and left sided greater trochanter pain.  Patient has had a posterior lumbar interbody fusion of L3-S1.  Patient has continued to have a significant amount of pain in the back.  They have been trying different medications.  Patient is seen neurosurgery and has had even a second opinion.  Patient does have a past medical history significant for rheumatoid arthritis.  States now most of the pain seems to be of the lateral aspect of the hips left greater than right.  Waking her up at night.  Worse after sitting for long amount of time.       Past Medical History:  Diagnosis Date  . Anemia    mild  . Anxiety   . Arthritis   . Asthma   . Cardiomyopathy ([place])   . CHF (congestive heart failure) ([place])   . Diabetes mellitus without complication ([place])   . Frequency of urination    at night  . GERD (gastroesophageal reflux disease)    potassium worsens reflux  . Hemorrhoids, external   . Hyperlipidemia   . Hypertension   . Neuromuscular disorder (H[facility])    neuropathy  . Numbness and tingling of both legs   . Rheumatoid arthritis ([place])   . Vertigo    hx of   Past Surgical History:  Procedure Laterality Date  . ABDOMINAL  HYSTERECTOMY    . CARDIAC CATHETERIZATION N/A 10/28/2014   Procedure: Left Heart Cath and Coronary Angiography;  Surgeon: Jettie Booze, MD;  Location: [facility];  Service: Cardiovascular;  Laterality: N/A;  . CARPAL TUNNEL RELEASE Left   . COLONOSCOPY     pt states 11 yr ago in [place] had a colon with 2 polyps- one cecal polyp per pt. one ? location  . COLONOSCOPY W/ POLYPECTOMY    . CYST REMOVAL NECK    . HEMORROIDECTOMY    . MAXIMUM ACCESS (MAS)POSTERIOR LUMBAR INTERBODY FUSION (PLIF) 3 LEVEL N/A 10/08/2013   Procedure: FOR MAXIMUM ACCESS (MAS) POSTERIOR LUMBAR INTERBODY FUSION (PLIF) 3 LEVEL;  Surgeon: Erline Levine, MD;  Location: [facility];  Service: Neurosurgery;  Laterality: N/A;  L3-4 L4-5 L5-S1 maximum access posterior lumbar interbody fusion with decompression  . TUBAL LIGATION     Social History   Socioeconomic History  . Marital status: Single    Spouse name: Not on file  . Number of children: Not on file  . Years of education: Not on file  . Highest education level: Not on file  Occupational History  . Not on file  Tobacco Use  . Smoking status: Former Research scientist (life sciences)  . Smokeless tobacco: Never Used  Vaping Use  . Vaping Use: Never used  Substance and Sexual Activity  . Alcohol use: Yes  Comment: 3 x a year -rare   . Drug use: No    Types: Marijuana  . Sexual activity: Not Currently  Other Topics Concern  . Not on file  Social History Narrative  . Not on file   Social Determinants of Health   Financial Resource Strain:   . Difficulty of Paying Living Expenses:   Food Insecurity:   . Worried About Charity fundraiser in the Last Year:   . Arboriculturist in the Last Year:   Transportation Needs:   . Film/video editor (Medical):   Marland Kitchen Lack of Transportation (Non-Medical):   Physical Activity:   . Days of Exercise per Week:   . Minutes of Exercise per Session:   Stress:   . Feeling of Stress :   Social Connections:   . Frequency of  Communication with Friends and Family:   . Frequency of Social Gatherings with Friends and Family:   . Attends Religious Services:   . Active Member of Clubs or Organizations:   . Attends Archivist Meetings:   Marland Kitchen Marital Status:    Allergies  Allergen Reactions  . Lisinopril Cough   Family History  Problem Relation Age of Onset  . Early death Father   . Heart disease Father   . Hypertension Sister   . Hypertension Brother   . Diabetes Brother   . Colon polyps Mother   . Hashimoto's thyroiditis Sister   . Colon cancer Maternal Grandmother        in her 64s  . Stomach cancer Maternal Aunt   . Non-Hodgkin's lymphoma Sister   . Alcohol abuse Neg Hx   . COPD Neg Hx   . Depression Neg Hx   . Drug abuse Neg Hx   . Hearing loss Neg Hx   . Hyperlipidemia Neg Hx   . Kidney disease Neg Hx   . Stroke Neg Hx   . Esophageal cancer Neg Hx   . Rectal cancer Neg Hx     Current Outpatient Medications (Endocrine & Metabolic):  .  metFORMIN (GLUCOPHAGE) 500 MG tablet, Take 1 tablet (500 mg total) by mouth 2 (two) times daily with a meal. Annual appt due in Sept must see provider for future refills  Current Outpatient Medications (Cardiovascular):  .  atorvastatin (LIPITOR) 40 MG tablet, Take 1 tablet by mouth once daily .  carvedilol (COREG) 6.25 MG tablet, TAKE 1 TABLET BY MOUTH TWICE DAILY WITH A MEAL .  ENTRESTO 97-103 MG, Take 1 tablet by mouth twice daily .  spironolactone (ALDACTONE) 25 MG tablet, TAKE 1 TABLET BY MOUTH ONCE DAILY **PLEASE  KEEP  UPCOMING  APPOINTMENT  IN  DECEMBER  FOR  FUTURE  REFILLS**  Current Outpatient Medications (Respiratory):  Marland Kitchen  Albuterol Sulfate (PROAIR RESPICLICK) 063 (90 Base) MCG/ACT AEPB, Inhale 2 puffs into the lungs 4 (four) times daily as needed.  Current Outpatient Medications (Analgesics):  .  allopurinol (ZYLOPRIM) 300 MG tablet, Take 1 tablet (300 mg total) by mouth daily. Marland Kitchen  aspirin EC 81 MG tablet, Take 1 tablet (81 mg total) by  mouth daily. Marland Kitchen  leflunomide (ARAVA) 20 MG tablet, Take 1 tablet (20 mg total) by mouth daily.  Current Outpatient Medications (Hematological):  .  cyanocobalamin 2000 MCG tablet, Take 1 tablet (2,000 mcg total) by mouth daily. .  folic acid (FOLVITE) 1 MG tablet, TAKE TWO (2) TABLETS (2 MG TOTAL)  BY MOUTH DAILY  Current Outpatient Medications (Other):  .  Accu-Chek FastClix Lancets MISC, Inject 1 Act into the skin 3 (three) times daily. .  Blood Glucose Monitoring Suppl (ACCU-CHEK GUIDE) w/Device KIT, 1 Act by Does not apply route 3 (three) times daily. .  Calcium Carb-Cholecalciferol (CALCIUM 600 + D PO), Take 1 tablet by mouth daily. .  citalopram (CELEXA) 20 MG tablet, Take 1 tablet by mouth once daily .  DEXILANT 60 MG capsule, Take 1 capsule by mouth once daily .  gabapentin (NEURONTIN) 600 MG tablet, TAKE 1 TABLET BY MOUTH THREE TIMES DAILY .  glucose blood (ACCU-CHEK GUIDE) test strip, Use TID .  Lancets Misc. (ACCU-CHEK FASTCLIX LANCET) KIT, Inject 1 Act into the skin 3 (three) times daily. .  mirabegron ER (MYRBETRIQ) 50 MG TB24 tablet, Take 1 tablet (50 mg total) by mouth daily. .  Omega-3 Fatty Acids (FISH OIL) 1200 MG CAPS, Take 1,200 mg by mouth daily.  .  vitamin C (ASCORBIC ACID) 500 MG tablet, Take 500 mg by mouth daily as needed (for cold symptoms).   Reviewed prior external information including notes and imaging from  primary care provider As well as notes that were available from care everywhere and other healthcare systems.  Past medical history, social, surgical and family history all reviewed in electronic medical record.  No pertanent information unless stated regarding to the chief complaint.   Review of Systems:  No headache, visual changes, nausea, vomiting, diarrhea, constipation, dizziness, abdominal pain, skin rash, fevers, chills, night sweats, weight loss, swollen lymph nodes, body aches, joint swelling, chest pain, shortness of breath, mood changes.  POSITIVE muscle aches  Objective  Blood pressure 120/70, height 5' 5"  (1.651 m), weight 213 lb (96.6 kg), last menstrual period 05/29/1991.   General: No apparent distress alert and oriented x3 mood and affect normal, dressed appropriately.  HEENT: Pupils equal, extraocular movements intact   MSK: Severely antalgic.  Patient does have pes planus of the feet bilaterally.  Patient does have external rotation of the hips noted.  Patient is severely tender to palpation over the greater trochanteric area.  Patient is back has significant loss of lordosis and does some degenerative scoliosis.  Patient's previous incisions are well-healed at this time.  Neurovascularly intact distally the patient does have unfortunately some atrophy of the lower extremities bilaterally.   Procedure: Real-time Ultrasound Guided Injection of right greater trochanteric bursitis secondary to patient's body habitus Device: GE Logiq Q7 Ultrasound guided injection is preferred based studies that show increased duration, increased effect, greater accuracy, decreased procedural pain, increased response rate, and decreased cost with ultrasound guided versus blind injection.  Verbal informed consent obtained.  Time-out conducted.  Noted no overlying erythema, induration, or other signs of local infection.  Skin prepped in a sterile fashion.  Local anesthesia: Topical Ethyl chloride.  With sterile technique and under real time ultrasound guidance:  Greater trochanteric area was visualized and patient's bursa was noted. A 22-gauge 3 inch needle was inserted and 4 cc of 0.5% Marcaine and 1 cc of Kenalog 40 mg/dL was injected. Pictures taken Completed without difficulty  Pain immediately resolved suggesting accurate placement of the medication.  Advised to call if fevers/chills, erythema, induration, drainage, or persistent bleeding.  Images permanently stored and available for review in the ultrasound unit.  Impression:  Technically successful ultrasound guided injection.   Procedure: Real-time Ultrasound Guided Injection of left  greater trochanteric bursitis secondary to patient's body habitus Device: GE Logiq Q7  Ultrasound guided injection is preferred based studies that show increased duration,  increased effect, greater accuracy, decreased procedural pain, increased response rate, and decreased cost with ultrasound guided versus blind injection.  Verbal informed consent obtained.  Time-out conducted.  Noted no overlying erythema, induration, or other signs of local infection.  Skin prepped in a sterile fashion.  Local anesthesia: Topical Ethyl chloride.  With sterile technique and under real time ultrasound guidance:  Greater trochanteric area was visualized and patient's bursa was noted. A 22-gauge 3 inch needle was inserted and 4 cc of 0.5% Marcaine and 1 cc of Kenalog 40 mg/dL was injected. Pictures taken Completed without difficulty  Pain immediately resolved suggesting accurate placement of the medication.  Advised to call if fevers/chills, erythema, induration, drainage, or persistent bleeding.  Images permanently stored and available for review in the ultrasound unit.  Impression: Technically successful ultrasound guided injection.    Impression and Recommendations:     The above documentation has been reviewed and is accurate and complete Lyndal Pulley, DO       Note: This dictation was prepared with Dragon dictation along with smaller phrase technology. Any transcriptional errors that result from this process are unintentional.

## 2019-11-09 NOTE — Assessment & Plan Note (Signed)
Severe overall.  Patient likely has no more potential surgical intervention that can be possible at this time.  Patient is going to try the long-acting gabapentin and see if this will be beneficial.

## 2019-11-09 NOTE — Progress Notes (Signed)
Subjective:  Patient ID: Emily Wheeler, female    DOB: Jan 14, 1956  Age: 64 y.o. MRN: [id number]  CC: Anemia, Hypertension, and Diabetes  This visit occurred during the SARS-CoV-2 public health emergency.  Safety protocols were in place, including screening questions prior to the visit, additional usage of staff PPE, and extensive cleaning of exam room while observing appropriate contact time as indicated for disinfecting solutions.    HPI Emily Wheeler presents for f/up - She complains of a several month history of urinary frequency, urgency, and near incontinence.  Outpatient Medications Prior to Visit  Medication Sig Dispense Refill  . Accu-Chek FastClix Lancets MISC Inject 1 Act into the skin 3 (three) times daily. 306 each 1  . Albuterol Sulfate (PROAIR RESPICLICK) 409 (90 Base) MCG/ACT AEPB Inhale 2 puffs into the lungs 4 (four) times daily as needed. 1 each 5  . allopurinol (ZYLOPRIM) 300 MG tablet Take 1 tablet (300 mg total) by mouth daily. 90 tablet 0  . aspirin EC 81 MG tablet Take 1 tablet (81 mg total) by mouth daily. 90 tablet 3  . atorvastatin (LIPITOR) 40 MG tablet Take 1 tablet by mouth once daily 90 tablet 1  . Blood Glucose Monitoring Suppl (ACCU-CHEK GUIDE) w/Device KIT 1 Act by Does not apply route 3 (three) times daily. 2 kit 0  . Calcium Carb-Cholecalciferol (CALCIUM 600 + D PO) Take 1 tablet by mouth daily.    . carvedilol (COREG) 6.25 MG tablet TAKE 1 TABLET BY MOUTH TWICE DAILY WITH A MEAL 180 tablet 0  . citalopram (CELEXA) 20 MG tablet Take 1 tablet by mouth once daily 90 tablet 0  . cyanocobalamin 2000 MCG tablet Take 1 tablet (2,000 mcg total) by mouth daily. 90 tablet 1  . DEXILANT 60 MG capsule Take 1 capsule by mouth once daily 90 capsule 1  . ENTRESTO 97-103 MG Take 1 tablet by mouth twice daily 811 tablet 2  . folic acid (FOLVITE) 1 MG tablet TAKE TWO (2) TABLETS (2 MG TOTAL)  BY MOUTH DAILY    . gabapentin (NEURONTIN) 600 MG tablet TAKE 1 TABLET BY  MOUTH THREE TIMES DAILY 270 tablet 0  . glucose blood (ACCU-CHEK GUIDE) test strip Use TID 100 each 12  . Lancets Misc. (ACCU-CHEK FASTCLIX LANCET) KIT Inject 1 Act into the skin 3 (three) times daily. 306 kit 1  . metFORMIN (GLUCOPHAGE) 500 MG tablet Take 1 tablet (500 mg total) by mouth 2 (two) times daily with a meal. Annual appt due in Sept must see provider for future refills 180 tablet 1  . Omega-3 Fatty Acids (FISH OIL) 1200 MG CAPS Take 1,200 mg by mouth daily.     Marland Kitchen spironolactone (ALDACTONE) 25 MG tablet TAKE 1 TABLET BY MOUTH ONCE DAILY **PLEASE  KEEP  UPCOMING  APPOINTMENT  IN  DECEMBER  FOR  FUTURE  REFILLS** 90 tablet 2  . vitamin C (ASCORBIC ACID) 500 MG tablet Take 500 mg by mouth daily as needed (for cold symptoms).    Marland Kitchen leflunomide (ARAVA) 20 MG tablet Take 1 tablet by mouth once daily 90 tablet 0   No facility-administered medications prior to visit.    ROS Review of Systems  Constitutional: Negative.  Negative for chills, diaphoresis, fatigue and fever.  HENT: Negative.   Eyes: Negative.   Respiratory: Negative for cough, chest tightness, shortness of breath and wheezing.   Cardiovascular: Negative for chest pain, palpitations and leg swelling.  Gastrointestinal: Negative for abdominal pain, constipation, diarrhea,  nausea and vomiting.  Endocrine: Positive for polyuria. Negative for polydipsia and polyphagia.  Genitourinary: Positive for frequency. Negative for decreased urine volume, difficulty urinating, dysuria, flank pain, hematuria, pelvic pain and urgency.  Musculoskeletal: Positive for arthralgias and back pain. Negative for myalgias and neck pain.  Skin: Negative.  Negative for color change and pallor.  Neurological: Negative.  Negative for dizziness, weakness, light-headedness and headaches.  Hematological: Negative for adenopathy. Does not bruise/bleed easily.  Psychiatric/Behavioral: Negative.     Objective:  BP 120/70 (BP Location: Left Arm, Patient  Position: Sitting, Cuff Size: Large)   Pulse 65   Temp 98.5 F (36.9 C) (Oral)   Resp 16   Ht 5' 5"  (1.651 m)   Wt 213 lb (96.6 kg)   LMP 05/29/1991   SpO2 98%   BMI 35.45 kg/m   BP Readings from Last 3 Encounters:  11/09/19 120/70  11/09/19 120/70  05/06/19 134/76    Wt Readings from Last 3 Encounters:  11/09/19 213 lb (96.6 kg)  11/09/19 213 lb (96.6 kg)  05/06/19 223 lb (101.2 kg)    Physical Exam Vitals reviewed.  Constitutional:      Appearance: Normal appearance.  HENT:     Nose: Nose normal.     Mouth/Throat:     Mouth: Mucous membranes are moist.  Eyes:     General: No scleral icterus.    Conjunctiva/sclera: Conjunctivae normal.  Cardiovascular:     Rate and Rhythm: Normal rate and regular rhythm.     Heart sounds: No murmur heard.   Pulmonary:     Effort: Pulmonary effort is normal.     Breath sounds: No stridor. No wheezing, rhonchi or rales.  Abdominal:     General: Abdomen is protuberant. Bowel sounds are normal.     Palpations: Abdomen is soft. There is no hepatomegaly, splenomegaly or mass.     Tenderness: There is no abdominal tenderness.  Musculoskeletal:        General: Normal range of motion.     Cervical back: Neck supple.     Right lower leg: No edema.     Left lower leg: No edema.  Lymphadenopathy:     Cervical: No cervical adenopathy.  Skin:    General: Skin is warm and dry.  Neurological:     General: No focal deficit present.     Mental Status: She is alert.  Psychiatric:        Mood and Affect: Mood normal.        Behavior: Behavior normal.     Lab Results  Component Value Date   WBC 4.0 11/09/2019   HGB 9.9 (L) 11/09/2019   HCT 30.0 (L) 11/09/2019   PLT 220.0 11/09/2019   GLUCOSE 103 (H) 11/09/2019   CHOL 143 11/09/2019   TRIG 120.0 11/09/2019   HDL 49.00 11/09/2019   LDLDIRECT 162.5 01/30/2013   LDLCALC 70 11/09/2019   ALT 12 11/09/2019   AST 15 11/09/2019   NA 140 11/09/2019   K 4.2 11/09/2019   CL 106  11/09/2019   CREATININE 1.11 11/09/2019   BUN 15 11/09/2019   CO2 28 11/09/2019   TSH 0.97 11/09/2019   INR 0.9 08/19/2018   HGBA1C 6.2 11/09/2019   MICROALBUR 1.6 11/09/2019    MM 3D SCREEN BREAST BILATERAL  Result Date: 05/13/2019 CLINICAL DATA:  Screening. EXAM: DIGITAL SCREENING BILATERAL MAMMOGRAM WITH TOMO AND CAD COMPARISON:  Previous exam(s). ACR Breast Density Category b: There are scattered areas of fibroglandular density. FINDINGS:  There are no findings suspicious for malignancy. Images were processed with CAD. IMPRESSION: No mammographic evidence of malignancy. A result letter of this screening mammogram will be mailed directly to the patient. RECOMMENDATION: Screening mammogram in one year. (Code:SM-B-01Y) BI-RADS CATEGORY  1: Negative. Electronically Signed   By: Lajean Manes M.D.   On: 05/13/2019 15:40    Assessment & Plan:   Emily Wheeler was seen today for anemia, hypertension and diabetes.  Diagnoses and all orders for this visit:  Essential hypertension, benign- Her BP is adequately well controlled. -     Basic metabolic panel; Future -     TSH; Future -     Urinalysis, Routine w reflex microscopic; Future -     Urinalysis, Routine w reflex microscopic -     TSH -     Basic metabolic panel  Type II diabetes mellitus with manifestations ([place])- Her A1C is at 6.2%. Her blood sugar is adequately well controlled. -     Basic metabolic panel; Future -     Hemoglobin A1c; Future -     Microalbumin / creatinine urine ratio; Future -     Microalbumin / creatinine urine ratio -     Hemoglobin A1c -     Basic metabolic panel  Anemia of chronic disease- Her H/H are stable. -     CBC with Differential/Platelet; Future -     CBC with Differential/Platelet  Hyperlipidemia with target LDL less than 100- She has achieved her LDL goal and is doing well on the statin. -     Lipid panel; Future -     Hepatic function panel; Future -     TSH; Future -     Hepatic function  panel -     TSH -     Lipid panel  Routine general medical examination at a health care facility  Vitamin B12 deficiency anemia due to intrinsic factor deficiency- Her B12 level is too high. I have asked her to stop taking the B12 supplement. -     CBC with Differential/Platelet; Future -     Vitamin B12; Future -     Folate; Future -     Folate -     Vitamin B12 -     CBC with Differential/Platelet  OAB (overactive bladder)- Evaluation of the urine is normal. Will treat for OAB. -     CULTURE, URINE COMPREHENSIVE; Future -     mirabegron ER (MYRBETRIQ) 50 MG TB24 tablet; Take 1 tablet (50 mg total) by mouth daily. -     CULTURE, URINE COMPREHENSIVE  Rheumatoid arthritis involving multiple joints (HCC) -     leflunomide (ARAVA) 20 MG tablet; Take 1 tablet (20 mg total) by mouth daily.  Stage 3a chronic kidney disease- She will avoid nephrotoxic agents.   I have changed Emily Wheeler's leflunomide. I am also having her start on mirabegron ER. Additionally, I am having her maintain her Fish Oil, aspirin EC, Calcium Carb-Cholecalciferol (CALCIUM 600 + D PO), vitamin C, Accu-Chek Guide, folic acid, cyanocobalamin, Accu-Chek FastClix Lancets, Accu-Chek FastClix Lancet, Albuterol Sulfate, Accu-Chek Guide, Dexilant, spironolactone, Entresto, carvedilol, citalopram, atorvastatin, allopurinol, gabapentin, and metFORMIN.  Meds ordered this encounter  Medications  . mirabegron ER (MYRBETRIQ) 50 MG TB24 tablet    Sig: Take 1 tablet (50 mg total) by mouth daily.    Dispense:  28 tablet    Refill:  0  . leflunomide (ARAVA) 20 MG tablet    Sig: Take 1 tablet (20  mg total) by mouth daily.    Dispense:  90 tablet    Refill:  1   In addition to time spent on CPE, I spent 50 minutes in preparing to see the patient by review of recent labs, imaging and procedures, obtaining and reviewing separately obtained history, communicating with the patient and family or caregiver, ordering medications,  tests or procedures, and documenting clinical information in the EHR including the differential Dx, treatment, and any further evaluation and other management of 1. Essential hypertension, benign 2. Type II diabetes mellitus with manifestations ([place]) 3. Anemia of chronic disease 4. Hyperlipidemia with target LDL less than 100 5. Vitamin B12 deficiency anemia due to intrinsic factor deficiency 6. OAB (overactive bladder) 7. Rheumatoid arthritis involving multiple joints (HCC) 8. Stage 3a chronic kidney disease.    Follow-up: Return in about 3 months (around 02/09/2020).  Scarlette Calico, MD

## 2019-11-09 NOTE — Assessment & Plan Note (Signed)
Bilateral injections given today, tolerated the procedure well, discussed icing regimen and home exercises.  Patient given a trial of the longstanding gabapentin.  Patient has had difficulty previously with patient's back and I do not think that there is any more surgical intervention that could be done.

## 2019-11-09 NOTE — Patient Instructions (Signed)
[facility] Good to see you. .  Turmeric 500mg  daily  Tart cherry extract 1200mg  at night Vitamin D 2000 IU daily  Stop gabapentin but take one Horizant at night See me again in 6-8 weeks

## 2019-11-10 DIAGNOSIS — N1831 Chronic kidney disease, stage 3a: Secondary | ICD-10-CM | POA: Insufficient documentation

## 2019-11-11 LAB — CULTURE, URINE COMPREHENSIVE: RESULT:: NO GROWTH

## 2019-11-18 ENCOUNTER — Other Ambulatory Visit: Payer: Self-pay

## 2019-11-24 ENCOUNTER — Ambulatory Visit (INDEPENDENT_AMBULATORY_CARE_PROVIDER_SITE_OTHER): Payer: Medicare Other | Admitting: Cardiology

## 2019-11-24 ENCOUNTER — Other Ambulatory Visit: Payer: Self-pay

## 2019-11-24 ENCOUNTER — Encounter: Payer: Self-pay | Admitting: Cardiology

## 2019-11-24 VITALS — BP 122/72 | HR 81 | Ht 64.0 in | Wt 206.2 lb

## 2019-11-24 DIAGNOSIS — I428 Other cardiomyopathies: Secondary | ICD-10-CM

## 2019-11-24 DIAGNOSIS — I739 Peripheral vascular disease, unspecified: Secondary | ICD-10-CM | POA: Diagnosis not present

## 2019-11-24 NOTE — Patient Instructions (Addendum)
Medication Instructions:   Your physician recommends that you continue on your current medications as directed. Please refer to the Current Medication list given to you today.  *If you need a refill on your cardiac medications before your next appointment, please call your pharmacy*  Testing/Procedures:  Your physician has requested that you have a lower  extremity arterial duplex. This test is an ultrasound of the arteries in the legs or arms. It looks at arterial blood flow in the legs and arms. Allow one hour for Lower and Upper Arterial scans. There are no restrictions or special instructions  Your physician has requested that you have an echocardiogram. Echocardiography is a painless test that uses sound waves to create images of your heart. It provides your doctor with information about the size and shape of your heart and how well your heart's chambers and valves are working. This procedure takes approximately one hour. There are no restrictions for this procedure.   Follow-Up: At [facility], you and your health needs are our priority.  As part of our continuing mission to provide you with exceptional heart care, we have created designated Provider Care Teams.  These Care Teams include your primary Cardiologist (physician) and Advanced Practice Providers (APPs -  Physician Assistants and Nurse Practitioners) who all work together to provide you with the care you need, when you need it.  We recommend signing up for the patient portal called "MyChart".  Sign up information is provided on this After Visit Summary.  MyChart is used to connect with patients for Virtual Visits (Telemedicine).  Patients are able to view lab/test results, encounter notes, upcoming appointments, etc.  Non-urgent messages can be sent to your provider as well.   To learn more about what you can do with MyChart, go to [email or web address].    Your next appointment:   6 months  The format for your next  appointment:   In Person  Provider:   Tobias Alexander, MD

## 2019-11-24 NOTE — Progress Notes (Signed)
11/24/2019 Emily CHECKETTS   06-Oct-1955  [id number]  Primary Physician [name], MD Primary Cardiologist: Dr. Meda Wheeler   Reason for Visit/CC: 6 month f/u for NICM  HPI:  Emily Wheeler Emily Wheeler is a 64 y.o. female, followed by Dr. Meda Wheeler, who presents to clinic today for routine follow-up. She has a history of nonischemic cardiomyopathy that was diagnosed in 2016. She initially presented with a complaint of dyspnea. Echocardiogram in April 2016 showed reduced EF at 35-40%. She initially underwent a nuclear stress test which was abnormal, leading to cardiac catheterization. Cardiac cath in June 2016 showed mild CAD thus a diagnosis of nonischemic cardiomyopathy was made. Her additional past medical history includes hypertension, hyperlipidemia and diabetes. She also has rheumatoid arthritis. Her recent follow-up echocardiogram was 04/10/2016. This continued to show reduced left ventricular ejection fraction at 35-40%. Grade 1 diastolic dysfunction was also noted with diffuse hypokinesis. No valvular abnormalities were noted.  She was last seen by Dr. Meda Wheeler November 2017. At that time, she was without complaints however her blood pressure was noted to be elevated and her losartan was increased to 100 mg daily. No other changes made. She presents back to clinic for 6 month follow-up.  Her only recent issues have been problems with rheumatoid arthritis. Her rheumatologist placed her on prednisone in June however she reports that she did not tolerate this well. This resulted in elevations in her blood glucose levels. She had a BMP done on 10/31/2016 which also showed a bump in her serum creatinine to 1.26. She reports that she was seen by her rheumatologist last week, Dr. Amil Wheeler, and she had repeat laboratory work done at that time. These were results are not available to review however she was told that everything checked out okay and was stable. We will request that these results be faxed to our office  for review.  11/24/2019 -the patient is coming after 6 months, she is feeling and looking great, she denies any chest pain or shortness of breath, no lower extremity edema, her major problem are lower back pain hip pain secondary to bursitis, this is improved significantly lately after steroid injections.  She has been experiencing tingling and significant muscle fatigue in her lower extremities suspicious for claudications after walking a short distance.  She has been tolerating her medications well.   Current Meds  Medication Sig  . Accu-Chek FastClix Lancets MISC Inject 1 Act into the skin 3 (three) times daily.  . Albuterol Sulfate (PROAIR RESPICLICK) 409 (90 Base) MCG/ACT AEPB Inhale 2 puffs into the lungs 4 (four) times daily as needed.  Marland Kitchen allopurinol (ZYLOPRIM) 300 MG tablet Take 1 tablet (300 mg total) by mouth daily.  Marland Kitchen aspirin EC 81 MG tablet Take 1 tablet (81 mg total) by mouth daily.  Marland Kitchen atorvastatin (LIPITOR) 40 MG tablet Take 1 tablet by mouth once daily  . Blood Glucose Monitoring Suppl (ACCU-CHEK GUIDE) w/Device KIT 1 Act by Does not apply route 3 (three) times daily.  . Calcium Carb-Cholecalciferol (CALCIUM 600 + D PO) Take 1 tablet by mouth daily.  . carvedilol (COREG) 6.25 MG tablet TAKE 1 TABLET BY MOUTH TWICE DAILY WITH A MEAL  . citalopram (CELEXA) 20 MG tablet Take 1 tablet by mouth once daily  . DEXILANT 60 MG capsule Take 1 capsule by mouth once daily  . ENTRESTO 97-103 MG Take 1 tablet by mouth twice daily  . gabapentin (NEURONTIN) 600 MG tablet TAKE 1 TABLET BY MOUTH THREE TIMES DAILY  .  glucose blood (ACCU-CHEK GUIDE) test strip Use TID  . Lancets Misc. (ACCU-CHEK FASTCLIX LANCET) KIT Inject 1 Act into the skin 3 (three) times daily.  Marland Kitchen leflunomide (ARAVA) 20 MG tablet Take 1 tablet (20 mg total) by mouth daily.  . metFORMIN (GLUCOPHAGE) 500 MG tablet Take 1 tablet (500 mg total) by mouth 2 (two) times daily with a meal. Annual appt due in Sept must see provider for  future refills  . mirabegron ER (MYRBETRIQ) 50 MG TB24 tablet Take 1 tablet (50 mg total) by mouth daily.  . Omega-3 Fatty Acids (FISH OIL) 1200 MG CAPS Take 1,200 mg by mouth daily.   Marland Kitchen spironolactone (ALDACTONE) 25 MG tablet TAKE 1 TABLET BY MOUTH ONCE DAILY **PLEASE  KEEP  UPCOMING  APPOINTMENT  IN  DECEMBER  FOR  FUTURE  REFILLS**  . vitamin C (ASCORBIC ACID) 500 MG tablet Take 500 mg by mouth daily as needed (for cold symptoms).   Allergies  Allergen Reactions  . Lisinopril Cough   Past Medical History:  Diagnosis Date  . Anemia    mild  . Anxiety   . Arthritis   . Asthma   . Cardiomyopathy ([place])   . CHF (congestive heart failure) ([place])   . Diabetes mellitus without complication ([place])   . Frequency of urination    at night  . GERD (gastroesophageal reflux disease)    potassium worsens reflux  . Hemorrhoids, external   . Hyperlipidemia   . Hypertension   . Neuromuscular disorder ([facility])    neuropathy  . Numbness and tingling of both legs   . Rheumatoid arthritis ([place])   . Vertigo    hx of   Family History  Problem Relation Age of Onset  . Early death Father   . Heart disease Father   . Hypertension Sister   . Hypertension Brother   . Diabetes Brother   . Colon polyps Mother   . Hashimoto's thyroiditis Sister   . Colon cancer Maternal Grandmother        in her 64s  . Stomach cancer Maternal Aunt   . Non-Hodgkin's lymphoma Sister   . Alcohol abuse Neg Hx   . COPD Neg Hx   . Depression Neg Hx   . Drug abuse Neg Hx   . Hearing loss Neg Hx   . Hyperlipidemia Neg Hx   . Kidney disease Neg Hx   . Stroke Neg Hx   . Esophageal cancer Neg Hx   . Rectal cancer Neg Hx    Past Surgical History:  Procedure Laterality Date  . ABDOMINAL HYSTERECTOMY    . CARDIAC CATHETERIZATION N/A 10/28/2014   Procedure: Left Heart Cath and Coronary Angiography;  Surgeon: Jettie Booze, MD;  Location: [facility];  Service: Cardiovascular;  Laterality: N/A;  . CARPAL  TUNNEL RELEASE Left   . COLONOSCOPY     pt states 11 yr ago in [place] had a colon with 2 polyps- one cecal polyp per pt. one ? location  . COLONOSCOPY W/ POLYPECTOMY    . CYST REMOVAL NECK    . HEMORROIDECTOMY    . MAXIMUM ACCESS (MAS)POSTERIOR LUMBAR INTERBODY FUSION (PLIF) 3 LEVEL N/A 10/08/2013   Procedure: FOR MAXIMUM ACCESS (MAS) POSTERIOR LUMBAR INTERBODY FUSION (PLIF) 3 LEVEL;  Surgeon: Erline Levine, MD;  Location: [facility];  Service: Neurosurgery;  Laterality: N/A;  L3-4 L4-5 L5-S1 maximum access posterior lumbar interbody fusion with decompression  . TUBAL LIGATION     Social History  Socioeconomic History  . Marital status: Single    Spouse name: Not on file  . Number of children: Not on file  . Years of education: Not on file  . Highest education level: Not on file  Occupational History  . Not on file  Tobacco Use  . Smoking status: Former Research scientist (life sciences)  . Smokeless tobacco: Never Used  Vaping Use  . Vaping Use: Never used  Substance and Sexual Activity  . Alcohol use: Yes    Comment: 3 x a year -rare   . Drug use: No    Types: Marijuana  . Sexual activity: Not Currently  Other Topics Concern  . Not on file  Social History Narrative  . Not on file   Social Determinants of Health   Financial Resource Strain:   . Difficulty of Paying Living Expenses:   Food Insecurity:   . Worried About Charity fundraiser in the Last Year:   . Arboriculturist in the Last Year:   Transportation Needs:   . Film/video editor (Medical):   Marland Kitchen Lack of Transportation (Non-Medical):   Physical Activity:   . Days of Exercise per Week:   . Minutes of Exercise per Session:   Stress:   . Feeling of Stress :   Social Connections:   . Frequency of Communication with Friends and Family:   . Frequency of Social Gatherings with Friends and Family:   . Attends Religious Services:   . Active Member of Clubs or Organizations:   . Attends Archivist Meetings:   Marland Kitchen Marital Status:     Intimate Partner Violence:   . Fear of Current or Ex-Partner:   . Emotionally Abused:   Marland Kitchen Physically Abused:   . Sexually Abused:      Review of Systems: General: negative for chills, fever, night sweats or weight changes.  Cardiovascular: negative for chest pain, dyspnea on exertion, edema, orthopnea, palpitations, paroxysmal nocturnal dyspnea or shortness of breath Dermatological: negative for rash Respiratory: negative for cough or wheezing Urologic: negative for hematuria Abdominal: negative for nausea, vomiting, diarrhea, bright red blood per rectum, melena, or hematemesis Neurologic: negative for visual changes, syncope, or dizziness All other systems reviewed and are otherwise negative except as noted above.   Physical Exam:  Blood pressure 122/72, pulse 81, height 5' 4"  (1.626 m), weight 206 lb 3.2 oz (93.5 kg), last menstrual period 05/29/1991, SpO2 97 %.  General appearance: alert, cooperative and no distress Neck: no carotid bruit and no JVD Lungs: clear to auscultation bilaterally Heart: regular rate and rhythm, S1, S2 normal, no murmur, click, rub or gallop Extremities: extremities normal, atraumatic, no cyanosis or edema Pulses: Weak bilaterally in both lower extremities.   Skin: Skin color, texture, turgor normal. No rashes or lesions Neurologic: Grossly normal  EKG NSR, -2 days in the inferior leads, unchanged from prior-- personally reviewed    ASSESSMENT AND PLAN:   1. NICM: EF 35-40%. First diagnosed in 2016. LHC at that time with mild nonobstructive CAD.  We will recheck and if still decreased LVEF we will verify with her insurance company coverage of Entresto and prescribed if covered.  Continue carvedilol and spironolactone.  2. HTN: controlled on current regimen.   3. HLD: on atorvastatin, at goal, well-tolerated.  4. CAD: mild nonobstructive CAD noted on cath 10/2014.  Asymptomatic.   5.  Claudications -we will obtain bilateral lower extremity  ultrasound as her pulses are weak.  Follow-Up w/ Dr. Meda Wheeler in 6  months.   Ena Dawley, MD [facility] 11/24/2019 10:10 AM

## 2019-12-03 ENCOUNTER — Encounter: Payer: Self-pay | Admitting: Internal Medicine

## 2019-12-07 ENCOUNTER — Other Ambulatory Visit: Payer: Self-pay | Admitting: Internal Medicine

## 2019-12-13 ENCOUNTER — Other Ambulatory Visit: Payer: Self-pay | Admitting: Internal Medicine

## 2019-12-13 DIAGNOSIS — K219 Gastro-esophageal reflux disease without esophagitis: Secondary | ICD-10-CM

## 2019-12-15 ENCOUNTER — Other Ambulatory Visit: Payer: Self-pay | Admitting: Cardiology

## 2019-12-15 DIAGNOSIS — I739 Peripheral vascular disease, unspecified: Secondary | ICD-10-CM

## 2019-12-22 ENCOUNTER — Ambulatory Visit: Payer: Medicare Other | Admitting: Family Medicine

## 2019-12-25 ENCOUNTER — Ambulatory Visit (HOSPITAL_BASED_OUTPATIENT_CLINIC_OR_DEPARTMENT_OTHER): Payer: Medicare Other

## 2019-12-25 ENCOUNTER — Ambulatory Visit (HOSPITAL_COMMUNITY)
Admission: RE | Admit: 2019-12-25 | Discharge: 2019-12-25 | Disposition: A | Discharge status: Home / Self Care | Payer: Medicare Other | Source: Ambulatory Visit | Attending: Cardiovascular Disease | Admitting: Cardiovascular Disease

## 2019-12-25 ENCOUNTER — Other Ambulatory Visit: Payer: Self-pay

## 2019-12-25 DIAGNOSIS — E669 Obesity, unspecified: Secondary | ICD-10-CM | POA: Insufficient documentation

## 2019-12-25 DIAGNOSIS — E119 Type 2 diabetes mellitus without complications: Secondary | ICD-10-CM | POA: Insufficient documentation

## 2019-12-25 DIAGNOSIS — Z87891 Personal history of nicotine dependence: Secondary | ICD-10-CM | POA: Insufficient documentation

## 2019-12-25 DIAGNOSIS — I428 Other cardiomyopathies: Secondary | ICD-10-CM

## 2019-12-25 DIAGNOSIS — E785 Hyperlipidemia, unspecified: Secondary | ICD-10-CM | POA: Insufficient documentation

## 2019-12-25 DIAGNOSIS — I110 Hypertensive heart disease with heart failure: Secondary | ICD-10-CM | POA: Diagnosis not present

## 2019-12-25 DIAGNOSIS — I2510 Atherosclerotic heart disease of native coronary artery without angina pectoris: Secondary | ICD-10-CM | POA: Insufficient documentation

## 2019-12-25 DIAGNOSIS — I739 Peripheral vascular disease, unspecified: Secondary | ICD-10-CM | POA: Diagnosis not present

## 2019-12-25 DIAGNOSIS — I509 Heart failure, unspecified: Secondary | ICD-10-CM | POA: Insufficient documentation

## 2019-12-25 LAB — ECHOCARDIOGRAM COMPLETE
Area-P 1/2: 2.76 cm2
S' Lateral: 5 cm

## 2019-12-27 ENCOUNTER — Other Ambulatory Visit: Payer: Self-pay | Admitting: Family Medicine

## 2019-12-29 ENCOUNTER — Telehealth: Payer: Self-pay

## 2019-12-29 DIAGNOSIS — I428 Other cardiomyopathies: Secondary | ICD-10-CM

## 2019-12-29 DIAGNOSIS — R9430 Abnormal result of cardiovascular function study, unspecified: Secondary | ICD-10-CM

## 2019-12-29 DIAGNOSIS — I5042 Chronic combined systolic (congestive) and diastolic (congestive) heart failure: Secondary | ICD-10-CM

## 2019-12-29 NOTE — Telephone Encounter (Signed)
Pt verbalized understanding of her Echo results and agrees to having a repeat echo prior to there 05/12/20 appt with Dr. Delton See.

## 2019-12-29 NOTE — Telephone Encounter (Signed)
-----   Message from Lars Masson, MD sent at 12/25/2019 12:28 PM EDT ----- LVEF 30-35%, continue Entresto, repeat echo in 4 months, if LVEF still < 35% refer for an ICD ----- Message ----- From: Interface, [employer] Sent: 12/25/2019   9:02 AM EDT To: Lars Masson, MD

## 2020-01-25 ENCOUNTER — Other Ambulatory Visit: Payer: Self-pay | Admitting: Internal Medicine

## 2020-01-25 DIAGNOSIS — M48061 Spinal stenosis, lumbar region without neurogenic claudication: Secondary | ICD-10-CM

## 2020-02-10 NOTE — Telephone Encounter (Signed)
Will send a message to our Echo Valerie Roys, to call the pt back and reschedule her for her echo, due in November, as ordered by Dr. Delton See, based on her echo results from 7/30.  Misty Stanley to call the pt and arrange echo appt.  Pt is aware of this and agrees with this plan.

## 2020-02-10 NOTE — Telephone Encounter (Signed)
RE: pt is ready to proceed with scheduling echo as ordered by Dr. Delton See Received: Today Janett Billow, LPN [birth year] @ 52:77 pt is aware

## 2020-02-10 NOTE — Telephone Encounter (Signed)
   12/29/19 11:19 AM Bourn, Ruffin Frederick, RN contacted Georgana Curio, RN     12/29/19 11:19 AM Note   ----- Message from Lars Masson, MD sent at 12/25/2019 12:28 PM EDT ----- LVEF 30-35%, continue Entresto, repeat echo in 4 months, if LVEF still < 35% refer for an ICD ----- Message ----- From: Interface, Three One Seven Sent: 12/25/2019   9:02 AM EDT To: Lars Masson, MD

## 2020-03-01 ENCOUNTER — Other Ambulatory Visit: Payer: Self-pay | Admitting: Internal Medicine

## 2020-03-01 ENCOUNTER — Other Ambulatory Visit: Payer: Self-pay | Admitting: Family Medicine

## 2020-03-01 DIAGNOSIS — E785 Hyperlipidemia, unspecified: Secondary | ICD-10-CM

## 2020-03-01 DIAGNOSIS — E118 Type 2 diabetes mellitus with unspecified complications: Secondary | ICD-10-CM

## 2020-04-12 ENCOUNTER — Other Ambulatory Visit: Payer: Self-pay | Admitting: Internal Medicine

## 2020-04-12 DIAGNOSIS — E118 Type 2 diabetes mellitus with unspecified complications: Secondary | ICD-10-CM

## 2020-04-13 ENCOUNTER — Telehealth: Payer: Self-pay | Admitting: Internal Medicine

## 2020-04-13 ENCOUNTER — Other Ambulatory Visit: Payer: Self-pay | Admitting: Internal Medicine

## 2020-04-13 DIAGNOSIS — E118 Type 2 diabetes mellitus with unspecified complications: Secondary | ICD-10-CM

## 2020-04-13 MED ORDER — METFORMIN HCL 500 MG PO TABS: 500.0000 mg | ORAL_TABLET | Freq: Two times a day (BID) | ORAL | 0 refills | Status: DC

## 2020-04-13 NOTE — Telephone Encounter (Signed)
1.Medication Requested: metFORMIN (GLUCOPHAGE) 500 MG tablet  2. Pharmacy (Name, Street, [place]): [facility] [address] [place], [place] - [address] Phone:  [phone]  Fax:  [phone]      3. On Med List:yes  4. Last Visit with PCP:06.14.21  5. Next visit date with PCP:11.22.21   Agent: Please be advised that RX refills may take up to 3 business days. We ask that you follow-up with your pharmacy.

## 2020-04-18 ENCOUNTER — Other Ambulatory Visit: Payer: Self-pay

## 2020-04-18 ENCOUNTER — Encounter: Payer: Self-pay | Admitting: Internal Medicine

## 2020-04-18 ENCOUNTER — Ambulatory Visit (INDEPENDENT_AMBULATORY_CARE_PROVIDER_SITE_OTHER): Payer: Medicare Other | Admitting: Internal Medicine

## 2020-04-18 VITALS — BP 138/86 | HR 73 | Temp 98.3°F | Resp 16 | Ht 64.0 in | Wt 214.0 lb

## 2020-04-18 DIAGNOSIS — N3281 Overactive bladder: Secondary | ICD-10-CM

## 2020-04-18 DIAGNOSIS — Z23 Encounter for immunization: Secondary | ICD-10-CM

## 2020-04-18 DIAGNOSIS — D510 Vitamin B12 deficiency anemia due to intrinsic factor deficiency: Secondary | ICD-10-CM

## 2020-04-18 DIAGNOSIS — E118 Type 2 diabetes mellitus with unspecified complications: Secondary | ICD-10-CM | POA: Diagnosis not present

## 2020-04-18 DIAGNOSIS — D638 Anemia in other chronic diseases classified elsewhere: Secondary | ICD-10-CM

## 2020-04-18 DIAGNOSIS — N1831 Chronic kidney disease, stage 3a: Secondary | ICD-10-CM

## 2020-04-18 LAB — CBC WITH DIFFERENTIAL/PLATELET
Basophils Absolute: 0 10*3/uL (ref 0.0–0.1)
Basophils Relative: 1 % (ref 0.0–3.0)
Eosinophils Absolute: 0.1 10*3/uL (ref 0.0–0.7)
Eosinophils Relative: 4 % (ref 0.0–5.0)
HCT: 31.2 % — ABNORMAL LOW (ref 36.0–46.0)
Hemoglobin: 10.2 g/dL — ABNORMAL LOW (ref 12.0–15.0)
Lymphocytes Relative: 53.7 % — ABNORMAL HIGH (ref 12.0–46.0)
Lymphs Abs: 2 10*3/uL (ref 0.7–4.0)
MCHC: 32.7 g/dL (ref 30.0–36.0)
MCV: 96.7 fl (ref 78.0–100.0)
Monocytes Absolute: 0.6 10*3/uL (ref 0.1–1.0)
Monocytes Relative: 15.7 % — ABNORMAL HIGH (ref 3.0–12.0)
Neutro Abs: 0.9 10*3/uL — ABNORMAL LOW (ref 1.4–7.7)
Neutrophils Relative %: 25.6 % — ABNORMAL LOW (ref 43.0–77.0)
Platelets: 234 10*3/uL (ref 150.0–400.0)
RBC: 3.22 Mil/uL — ABNORMAL LOW (ref 3.87–5.11)
RDW: 15.5 % (ref 11.5–15.5)
WBC: 3.7 10*3/uL — ABNORMAL LOW (ref 4.0–10.5)

## 2020-04-18 LAB — BASIC METABOLIC PANEL
BUN: 15 mg/dL (ref 6–23)
CO2: 25 mEq/L (ref 19–32)
Calcium: 8.9 mg/dL (ref 8.4–10.5)
Chloride: 105 mEq/L (ref 96–112)
Creatinine, Ser: 1.05 mg/dL (ref 0.40–1.20)
GFR: 55.51 mL/min — ABNORMAL LOW (ref >60.00)
Glucose, Bld: 88 mg/dL (ref 70–99)
Potassium: 4.3 mEq/L (ref 3.5–5.1)
Sodium: 138 mEq/L (ref 135–145)

## 2020-04-18 LAB — HEMOGLOBIN A1C: Hgb A1c MFr Bld: 6.1 % (ref 4.6–6.5)

## 2020-04-18 MED ORDER — MIRABEGRON ER 50 MG PO TB24: 50.0000 mg | ORAL_TABLET | Freq: Every day | ORAL | 1 refills | Status: DC

## 2020-04-18 NOTE — Progress Notes (Signed)
Subjective:  Patient ID: Emily Wheeler, female    DOB: 06-28-1955  Age: 64 y.o. MRN: [id number]  CC: Anemia, Hypertension, and Diabetes  This visit occurred during the SARS-CoV-2 public health emergency.  Safety protocols were in place, including screening questions prior to the visit, additional usage of staff PPE, and extensive cleaning of exam room while observing appropriate contact time as indicated for disinfecting solutions.    HPI Emily Wheeler presents for f/up - She continues to struggle with frequent urination and urinary urgency.  She previously took Myrbetriq and had a good response to it but has since run out.  She continues to complain of DOE but denies chest pain, palpitations, or edema.  She thinks she will be getting a defibrillator soon.  Outpatient Medications Prior to Visit  Medication Sig Dispense Refill  . Accu-Chek FastClix Lancets MISC Inject 1 Act into the skin 3 (three) times daily. 306 each 1  . Albuterol Sulfate (PROAIR RESPICLICK) 741 (90 Base) MCG/ACT AEPB Inhale 2 puffs into the lungs 4 (four) times daily as needed. 1 each 5  . allopurinol (ZYLOPRIM) 300 MG tablet Take 1 tablet by mouth once daily 90 tablet 0  . aspirin EC 81 MG tablet Take 1 tablet (81 mg total) by mouth daily. 90 tablet 3  . atorvastatin (LIPITOR) 40 MG tablet Take 1 tablet by mouth once daily 90 tablet 0  . Blood Glucose Monitoring Suppl (ACCU-CHEK GUIDE) w/Device KIT 1 Act by Does not apply route 3 (three) times daily. 2 kit 0  . Calcium Carb-Cholecalciferol (CALCIUM 600 + D PO) Take 1 tablet by mouth daily.    . carvedilol (COREG) 6.25 MG tablet TAKE 1 TABLET BY MOUTH TWICE DAILY WITH A MEAL 180 tablet 0  . citalopram (CELEXA) 20 MG tablet Take 1 tablet by mouth once daily 90 tablet 0  . DEXILANT 60 MG capsule Take 1 capsule by mouth once daily 90 capsule 0  . ENTRESTO 97-103 MG Take 1 tablet by mouth twice daily 180 tablet 2  . gabapentin (NEURONTIN) 600 MG tablet TAKE 1 TABLET BY  MOUTH THREE TIMES DAILY 270 tablet 0  . glucose blood (ACCU-CHEK GUIDE) test strip Use TID 100 each 12  . Lancets Misc. (ACCU-CHEK FASTCLIX LANCET) KIT Inject 1 Act into the skin 3 (three) times daily. 306 kit 1  . leflunomide (ARAVA) 20 MG tablet Take 1 tablet (20 mg total) by mouth daily. 90 tablet 1  . metFORMIN (GLUCOPHAGE) 500 MG tablet Take 1 tablet (500 mg total) by mouth 2 (two) times daily with a meal. Annual appt due in Sept must see provider for future refills 180 tablet 0  . Omega-3 Fatty Acids (FISH OIL) 1200 MG CAPS Take 1,200 mg by mouth daily.     Marland Kitchen spironolactone (ALDACTONE) 25 MG tablet TAKE 1 TABLET BY MOUTH ONCE DAILY **PLEASE  KEEP  UPCOMING  APPOINTMENT  IN  DECEMBER  FOR  FUTURE  REFILLS** 90 tablet 2  . vitamin C (ASCORBIC ACID) 500 MG tablet Take 500 mg by mouth daily as needed (for cold symptoms).    . mirabegron ER (MYRBETRIQ) 50 MG TB24 tablet Take 1 tablet (50 mg total) by mouth daily. 28 tablet 0   No facility-administered medications prior to visit.    ROS Review of Systems  Constitutional: Positive for unexpected weight change (wt gain). Negative for appetite change, chills, diaphoresis, fatigue and fever.  HENT: Negative.   Eyes: Negative for visual disturbance.  Respiratory: Positive  for shortness of breath. Negative for cough, chest tightness and wheezing.   Cardiovascular: Negative for chest pain, palpitations and leg swelling.  Gastrointestinal: Negative for abdominal pain, diarrhea, nausea and vomiting.  Endocrine: Negative.   Genitourinary: Positive for frequency and urgency. Negative for difficulty urinating, dysuria and hematuria.  Musculoskeletal: Negative.  Negative for arthralgias and myalgias.  Skin: Negative.  Negative for color change, pallor and rash.  Neurological: Negative.  Negative for dizziness, weakness and light-headedness.  Hematological: Negative for adenopathy. Does not bruise/bleed easily.  Psychiatric/Behavioral: Negative.      Objective:  BP 138/86   Pulse 73   Temp 98.3 F (36.8 C) (Oral)   Resp 16   Ht _0  (1.626 m)   Wt 214 lb (97.1 kg)   LMP 05/29/1991   SpO2 98%   BMI 36.73 kg/m   BP Readings from Last 3 Encounters:  04/18/20 138/86  11/24/19 122/72  11/09/19 120/70    Wt Readings from Last 3 Encounters:  04/18/20 214 lb (97.1 kg)  11/24/19 206 lb 3.2 oz (93.5 kg)  11/09/19 213 lb (96.6 kg)    Physical Exam Vitals reviewed.  Constitutional:      Appearance: She is obese.  HENT:     Mouth/Throat:     Mouth: Mucous membranes are moist.  Eyes:     General: No scleral icterus.    Conjunctiva/sclera: Conjunctivae normal.  Cardiovascular:     Rate and Rhythm: Normal rate and regular rhythm.     Heart sounds: No murmur heard.   Pulmonary:     Breath sounds: No stridor. No wheezing, rhonchi or rales.  Abdominal:     General: Abdomen is flat.     Palpations: There is no mass.     Tenderness: There is no abdominal tenderness. There is no guarding.  Musculoskeletal:        General: Normal range of motion.     Cervical back: Neck supple.     Right lower leg: No edema.     Left lower leg: No edema.  Lymphadenopathy:     Cervical: No cervical adenopathy.  Skin:    General: Skin is warm and dry.     Coloration: Skin is not pale.  Neurological:     General: No focal deficit present.     Mental Status: She is alert and oriented to person, place, and time. Mental status is at baseline.  Psychiatric:        Mood and Affect: Mood normal.        Behavior: Behavior normal.     Lab Results  Component Value Date   WBC 3.7 (L) 04/18/2020   HGB 10.2 (L) 04/18/2020   HCT 31.2 (L) 04/18/2020   PLT 234.0 04/18/2020   GLUCOSE 88 04/18/2020   CHOL 143 11/09/2019   TRIG 120.0 11/09/2019   HDL 49.00 11/09/2019   LDLDIRECT 162.5 01/30/2013   LDLCALC 70 11/09/2019   ALT 12 11/09/2019   AST 15 11/09/2019   NA 138 04/18/2020   K 4.3 04/18/2020   CL 105 04/18/2020   CREATININE 1.05  04/18/2020   BUN 15 04/18/2020   CO2 25 04/18/2020   TSH 0.97 11/09/2019   INR 0.9 08/19/2018   HGBA1C 6.1 04/18/2020   MICROALBUR 1.6 11/09/2019    VAS [place] ABI WITH/WO TBI  Result Date: 12/25/2019 LOWER EXTREMITY DOPPLER STUDY Indications: Patient presents today with complaints of right thigh numbness,  intermittent numbness and pain radiating from the lower back down              the left leg at rest especially if lying down on her left side and              immediately when walking. Patient also c/o numbness in both feet.              She does not describe true claudication symptoms or rest pain. High Risk         Hypertension, hyperlipidemia, Diabetes, past history of Factors:          smoking.  Comparison Study: In 10/2016, an arterial Doppler showed an ABI of 1.06 on the                   right and 1.19 on the left. Performing Technologist: Sharlett Iles RVT  Examination Guidelines: A complete evaluation includes at minimum, Doppler waveform signals and systolic blood pressure reading at the level of bilateral brachial, anterior tibial, and posterior tibial arteries, when vessel segments are accessible. Bilateral testing is considered an integral part of a complete examination. Photoelectric Plethysmograph (PPG) waveforms and toe systolic pressure readings are included as required and additional duplex testing as needed. Limited examinations for reoccurring indications may be performed as noted.  ABI Findings: +---------+------------------+-----+---------+--------+ Right    Rt Pressure (mmHg)IndexWaveform Comment  +---------+------------------+-----+---------+--------+ Brachial 140                                      +---------+------------------+-----+---------+--------+ ATA      163               1.16 triphasic         +---------+------------------+-----+---------+--------+ PTA      178               1.27 triphasic          +---------+------------------+-----+---------+--------+ PERO     141               1.01 triphasic         +---------+------------------+-----+---------+--------+ Great Toe171               1.22 Normal            +---------+------------------+-----+---------+--------+ +---------+------------------+-----+---------+-------+ Left     Lt Pressure (mmHg)IndexWaveform Comment +---------+------------------+-----+---------+-------+ Brachial 138                                     +---------+------------------+-----+---------+-------+ ATA      160               1.14 triphasic        +---------+------------------+-----+---------+-------+ PTA      169               1.21 triphasic        +---------+------------------+-----+---------+-------+ PERO     143               1.02 triphasic        +---------+------------------+-----+---------+-------+ Doristine Devoid Toe218               1.56 Normal           +---------+------------------+-----+---------+-------+ +-------+-----------+-----------+------------+------------+ ABI/TBIToday's ABIToday's TBIPrevious ABIPrevious TBI +-------+-----------+-----------+------------+------------+ Right  1.27       1.22  1.01        1.06         +-------+-----------+-----------+------------+------------+ Left   1.21       1.56       1.19        1.13         +-------+-----------+-----------+------------+------------+ Bilateral ABIs and TBIs appear essentially unchanged compared to prior study on 10/31/2016.  Summary: Right: Resting right ankle-brachial index is within normal range. No evidence of significant right lower extremity arterial disease. The right toe-brachial index is normal. Left: Resting left ankle-brachial index is within normal range. No evidence of significant left lower extremity arterial disease. The left toe-brachial index is normal.  *See table(s) above for measurements and observations.  Electronically signed by Kathlyn Sacramento MD on 12/25/2019 at 12:52:25 PM.    Final    ECHOCARDIOGRAM COMPLETE  Result Date: 12/25/2019    ECHOCARDIOGRAM REPORT   Patient Name:   MANUELLA BLACKSON Date of Exam: 12/25/2019 Medical Rec #:  [id number]        Height:       64.0 in Accession #:    [id number]       Weight:       206.2 lb Date of Birth:  03/30/1956        BSA:          1.982 m Patient Age:    20 years         BP:           122/72 mmHg Patient Gender: F                HR:           70 bpm. Exam Location:  [facility] Procedure: 2D Echo, Cardiac Doppler and Color Doppler Indications:    I42.8  History:        Patient has prior history of Echocardiogram examinations, most                 recent 04/02/2018. Non ischemic cardiomyopathy and CHF, CAD; Risk                 Factors:Hypertension, Diabetes, Dyslipidemia, Obesity and Former                 Smoker.  Sonographer:    Coralyn Helling RDCS Referring Phys: [id number] [place]  1. EF similar to slightly worse than noted on echo done 03/2018. Left ventricular ejection fraction, by estimation, is 30 to 35%. The left ventricle has moderately decreased function. The left ventricle demonstrates global hypokinesis. The left ventricular internal cavity size was moderately dilated. Left ventricular diastolic parameters were normal.  2. Right ventricular systolic function is normal. The right ventricular size is normal.  3. The mitral valve is normal in structure. Trivial mitral valve regurgitation. No evidence of mitral stenosis.  4. The aortic valve is normal in structure. Aortic valve regurgitation is not visualized. No aortic stenosis is present.  5. The inferior vena cava is normal in size with greater than 50% respiratory variability, suggesting right atrial pressure of 3 mmHg. FINDINGS  Left Ventricle: EF similar to slightly worse than noted on echo done 03/2018. Left ventricular ejection fraction, by estimation, is 30 to 35%. The left ventricle has moderately decreased  function. The left ventricle demonstrates global hypokinesis. The left ventricular internal cavity size was moderately dilated. There is no left ventricular hypertrophy. Left ventricular diastolic parameters were normal. Right Ventricle: The right ventricular size is  normal. No increase in right ventricular wall thickness. Right ventricular systolic function is normal. Left Atrium: Left atrial size was normal in size. Right Atrium: Right atrial size was normal in size. Pericardium: There is no evidence of pericardial effusion. Mitral Valve: The mitral valve is normal in structure. Normal mobility of the mitral valve leaflets. Trivial mitral valve regurgitation. No evidence of mitral valve stenosis. Tricuspid Valve: The tricuspid valve is normal in structure. Tricuspid valve regurgitation is not demonstrated. No evidence of tricuspid stenosis. Aortic Valve: The aortic valve is normal in structure. Aortic valve regurgitation is not visualized. No aortic stenosis is present. Pulmonic Valve: The pulmonic valve was normal in structure. Pulmonic valve regurgitation is not visualized. No evidence of pulmonic stenosis. Aorta: The aortic root is normal in size and structure. Venous: The inferior vena cava is normal in size with greater than 50% respiratory variability, suggesting right atrial pressure of 3 mmHg. IAS/Shunts: No atrial level shunt detected by color flow Doppler.  LEFT VENTRICLE PLAX 2D LVIDd:         6.10 cm  Diastology LVIDs:         5.00 cm  LV e' lateral:   6.74 cm/s LV PW:         1.10 cm  LV E/e' lateral: 9.3 LV IVS:        0.90 cm  LV e' medial:    5.22 cm/s LVOT diam:     2.10 cm  LV E/e' medial:  12.0 LV SV:         53 LV SV Index:   27 LVOT Area:     3.46 cm  RIGHT VENTRICLE             IVC RV S prime:     11.30 cm/s  IVC diam: 1.00 cm TAPSE (M-mode): 1.9 cm LEFT ATRIUM           Index       RIGHT ATRIUM           Index LA diam:      2.90 cm 1.46 cm/m  RA Pressure: 3.00 mmHg LA Vol (A2C): 53.9 ml  27.20 ml/m RA Area:     16.90 cm LA Vol (A4C): 65.3 ml 32.95 ml/m RA Volume:   48.80 ml  24.62 ml/m  AORTIC VALVE LVOT Vmax:   70.30 cm/s LVOT Vmean:  46.240 cm/s LVOT VTI:    0.154 m  AORTA Ao Root diam: 3.40 cm Ao Asc diam:  3.20 cm MV E velocity: 62.90 cm/s  TRICUSPID VALVE MV A velocity: 80.50 cm/s  Estimated RAP:  3.00 mmHg MV E/A ratio:  0.78                            SHUNTS                            Systemic VTI:  0.15 m                            Systemic Diam: 2.10 cm Jenkins Rouge MD Electronically signed by Jenkins Rouge MD Signature Date/Time: 12/25/2019/9:02:25 AM    Final     Assessment & Plan:   Emily Wheeler was seen today for anemia, hypertension and diabetes.  Diagnoses and all orders for this visit:  Flu vaccine need -     Flu Vaccine QUAD High  Dose(Fluad)  Type II diabetes mellitus with manifestations ([place])- Her A1c is at 6.1%.  Her blood sugar is adequately well controlled. 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 Her recent B12 level was too high. -     CBC with Differential/Platelet; Future -     CBC with Differential/Platelet  Anemia of chronic disease- Her H&H have improved. -     CBC with Differential/Platelet; Future -     CBC with Differential/Platelet  OAB (overactive bladder) -     mirabegron ER (MYRBETRIQ) 50 MG TB24 tablet; Take 1 tablet (50 mg total) by mouth daily.  Stage 3a chronic kidney disease ([place])- Her blood pressure and blood sugars are adequately well controlled.  She is avoiding nephrotoxic agents.   I am having Emily Wheeler maintain her Fish Oil, aspirin EC, Calcium Carb-Cholecalciferol (CALCIUM 600 + D PO), vitamin C, Accu-Chek Guide, Accu-Chek FastClix Lancets, Accu-Chek [employer], Albuterol Sulfate, Accu-Chek Guide, spironolactone, Entresto, carvedilol, leflunomide,  Dexilant, gabapentin, atorvastatin, allopurinol, citalopram, metFORMIN, and mirabegron ER.  Meds ordered this encounter  Medications  . mirabegron ER (MYRBETRIQ) 50 MG TB24 tablet    Sig: Take 1 tablet (50 mg total) by mouth daily.    Dispense:  90 tablet    Refill:  1     Follow-up: Return in about 6 months (around 10/16/2020).  Scarlette Calico, MD

## 2020-04-18 NOTE — Patient Instructions (Signed)
Type 2 Diabetes Mellitus, Diagnosis, Adult Type 2 diabetes (type 2 diabetes mellitus) is a long-term (chronic) disease. In type 2 diabetes, one or both of these problems may be present:  The pancreas does not make enough of a hormone called insulin.  Cells in the body do not respond properly to insulin that the body makes (insulin resistance). Normally, insulin allows blood sugar (glucose) to enter cells in the body. The cells use glucose for energy. Insulin resistance or lack of insulin causes excess glucose to build up in the blood instead of going into cells. As a result, high blood glucose (hyperglycemia) develops. What increases the risk? The following factors may make you more likely to develop type 2 diabetes:  Having a family member with type 2 diabetes.  Being overweight or obese.  Having an inactive (sedentary) lifestyle.  Having been diagnosed with insulin resistance.  Having a history of prediabetes, gestational diabetes, or polycystic ovary syndrome (PCOS).  Being of American-Indian, African-American, Hispanic/Latino, or Asian/Pacific Islander descent. What are the signs or symptoms? In the early stage of this condition, you may not have symptoms. Symptoms develop slowly and may include:  Increased thirst (polydipsia).  Increased hunger(polyphagia).  Increased urination (polyuria).  Increased urination during the night (nocturia).  Unexplained weight loss.  Frequent infections that keep coming back (recurring).  Fatigue.  Weakness.  Vision changes, such as blurry vision.  Cuts or bruises that are slow to heal.  Tingling or numbness in the hands or feet.  Dark patches on the skin (acanthosis nigricans). How is this diagnosed? This condition is diagnosed based on your symptoms, your medical history, a physical exam, and your blood glucose level. Your blood glucose may be checked with one or more of the following blood tests:  A fasting blood glucose (FBG)  test. You will not be allowed to eat (you will fast) for 8 hours or longer before a blood sample is taken.  A random blood glucose test. This test checks blood glucose at any time of day regardless of when you ate.  An A1c (hemoglobin A1c) blood test. This test provides information about blood glucose control over the previous 2-3 months.  An oral glucose tolerance test (OGTT). This test measures your blood glucose at two times: ? After fasting. This is your baseline blood glucose level. ? Two hours after drinking a beverage that contains glucose. You may be diagnosed with type 2 diabetes if:  Your FBG level is 126 mg/dL (7.0 mmol/L) or higher.  Your random blood glucose level is 200 mg/dL (11.1 mmol/L) or higher.  Your A1c level is 6.5% or higher.  Your OGTT result is higher than 200 mg/dL (11.1 mmol/L). These blood tests may be repeated to confirm your diagnosis. How is this treated? Your treatment may be managed by a specialist called an endocrinologist. Type 2 diabetes may be treated by following instructions from your health care provider about:  Making diet and lifestyle changes. This may include: ? Following an individualized nutrition plan that is developed by a diet and nutrition specialist (registered dietitian). ? Exercising regularly. ? Finding ways to manage stress.  Checking your blood glucose level as often as told.  Taking diabetes medicines or insulin daily. This helps to keep your blood glucose levels in the healthy range. ? If you use insulin, you may need to adjust the dosage depending on how physically active you are and what foods you eat. Your health care provider will tell you how to adjust your dosage.    Taking medicines to help prevent complications from diabetes, such as: ? Aspirin. ? Medicine to lower cholesterol. ? Medicine to control blood pressure. Your health care provider will set individualized treatment goals for you. Your goals will be based on  your age, other medical conditions you have, and how you respond to diabetes treatment. Generally, the goal of treatment is to maintain the following blood glucose levels:  Before meals (preprandial): 80-130 mg/dL (4.4-7.2 mmol/L).  After meals (postprandial): below 180 mg/dL (10 mmol/L).  A1c level: less than 7%. Follow these instructions at home: Questions to ask your health care provider  Consider asking the following questions: ? Do I need to meet with a diabetes educator? ? Where can I find a support group for people with diabetes? ? What equipment will I need to manage my diabetes at home? ? What diabetes medicines do I need, and when should I take them? ? How often do I need to check my blood glucose? ? What number can I call if I have questions? ? When is my next appointment? General instructions  Take over-the-counter and prescription medicines only as told by your health care provider.  Keep all follow-up visits as told by your health care provider. This is important.  For more information about diabetes, visit: ? American Diabetes Association (ADA): www.diabetes.org ? American Association of Diabetes Educators (AADE): www.diabeteseducator.org Contact a health care provider if:  Your blood glucose is at or above 240 mg/dL (13.3 mmol/L) for 2 days in a row.  You have been sick or have had a fever for 2 days or longer, and you are not getting better.  You have any of the following problems for more than 6 hours: ? You cannot eat or drink. ? You have nausea and vomiting. ? You have diarrhea. Get help right away if:  Your blood glucose is lower than 54 mg/dL (3.0 mmol/L).  You become confused or you have trouble thinking clearly.  You have difficulty breathing.  You have moderate or large ketone levels in your urine. Summary  Type 2 diabetes (type 2 diabetes mellitus) is a long-term (chronic) disease. In type 2 diabetes, the pancreas does not make enough of a  hormone called insulin, or cells in the body do not respond properly to insulin that the body makes (insulin resistance).  This condition is treated by making diet and lifestyle changes and taking diabetes medicines or insulin.  Your health care provider will set individualized treatment goals for you. Your goals will be based on your age, other medical conditions you have, and how you respond to diabetes treatment.  Keep all follow-up visits as told by your health care provider. This is important. This information is not intended to replace advice given to you by your health care provider. Make sure you discuss any questions you have with your health care provider. Document Revised: 07/12/2017 Document Reviewed: 06/17/2015 Elsevier Patient Education  2020 [employer] Inc[employer]  

## 2020-04-22 ENCOUNTER — Other Ambulatory Visit: Payer: Self-pay | Admitting: Internal Medicine

## 2020-04-22 DIAGNOSIS — K219 Gastro-esophageal reflux disease without esophagitis: Secondary | ICD-10-CM

## 2020-04-25 ENCOUNTER — Ambulatory Visit (INDEPENDENT_AMBULATORY_CARE_PROVIDER_SITE_OTHER): Payer: Medicare Other | Admitting: Family Medicine

## 2020-04-25 ENCOUNTER — Ambulatory Visit: Payer: Self-pay

## 2020-04-25 ENCOUNTER — Ambulatory Visit (INDEPENDENT_AMBULATORY_CARE_PROVIDER_SITE_OTHER): Payer: Medicare Other

## 2020-04-25 ENCOUNTER — Other Ambulatory Visit: Payer: Self-pay

## 2020-04-25 ENCOUNTER — Encounter: Payer: Self-pay | Admitting: Family Medicine

## 2020-04-25 VITALS — BP 158/90 | HR 81 | Ht 64.0 in | Wt 214.0 lb

## 2020-04-25 DIAGNOSIS — M25552 Pain in left hip: Secondary | ICD-10-CM

## 2020-04-25 DIAGNOSIS — M545 Low back pain, unspecified: Secondary | ICD-10-CM

## 2020-04-25 DIAGNOSIS — G8929 Other chronic pain: Secondary | ICD-10-CM

## 2020-04-25 DIAGNOSIS — M170 Bilateral primary osteoarthritis of knee: Secondary | ICD-10-CM | POA: Diagnosis not present

## 2020-04-25 DIAGNOSIS — M7061 Trochanteric bursitis, right hip: Secondary | ICD-10-CM

## 2020-04-25 DIAGNOSIS — M25562 Pain in left knee: Secondary | ICD-10-CM

## 2020-04-25 DIAGNOSIS — M069 Rheumatoid arthritis, unspecified: Secondary | ICD-10-CM

## 2020-04-25 DIAGNOSIS — M25551 Pain in right hip: Secondary | ICD-10-CM | POA: Diagnosis not present

## 2020-04-25 DIAGNOSIS — M25561 Pain in right knee: Secondary | ICD-10-CM | POA: Diagnosis not present

## 2020-04-25 DIAGNOSIS — M7062 Trochanteric bursitis, left hip: Secondary | ICD-10-CM

## 2020-04-25 DIAGNOSIS — M4316 Spondylolisthesis, lumbar region: Secondary | ICD-10-CM | POA: Diagnosis not present

## 2020-04-25 NOTE — Assessment & Plan Note (Signed)
Repeat injections given today.  Severe arthritic changes.  X-rays ordered today to further evaluate.  Could be a candidate for viscosupplementation.  Patient will get over-the-counter orthotics that I think will be beneficial as well.  Icing regimen.  Follow-up again in 4 to 8 weeks

## 2020-04-25 NOTE — Assessment & Plan Note (Signed)
Left-sided injected again today.  Tolerated the procedure well.  We will get pelvis x-ray to further evaluate but likely more secondary to the nerves in the back.  Patient is on many different medications including gabapentin at this time.  May need to consider the possibility of changing citalopram to Cymbalta for more pain control if this continues.  We will see her again in 6 to 8 weeks but cannot repeat injections for 12 weeks.

## 2020-04-25 NOTE — Assessment & Plan Note (Signed)
Likely contributing and will need to continue to monitor.

## 2020-04-25 NOTE — Progress Notes (Signed)
[facility] [address] [place] Phone: [phone] Subjective:   I Emily Wheeler am serving as a Education administrator for Dr. Hulan Saas.  This visit occurred during the SARS-CoV-2 public health emergency.  Safety protocols were in place, including screening questions prior to the visit, additional usage of staff PPE, and extensive cleaning of exam room while observing appropriate contact time as indicated for disinfecting solutions.   I'm seeing this patient by the request  of:  [name], MD  CC: Bilateral hip and bilateral knee pain  [id number]   11/09/2019 Patient does have arthritic changes remaining injections at follow-up.   Severe overall.  Patient likely has no more potential surgical intervention that can be possible at this time.  Patient is going to try the long-acting gabapentin and see if this will be beneficial.  Bilateral injections given today, tolerated the procedure well, discussed icing regimen and home exercises.  Patient given a trial of the longstanding gabapentin.  Patient has had difficulty previously with patient's back and I do not think that there is any more surgical intervention that could be done.  Update 04/25/2020 Emily Wheeler is a 64 y.o. female coming in with complaint of hip and bilateral knee pain. Patient states she would like injections. States she is in pain.  Patient states that her knees are worsening again.  Last injections were greater than 1 year ago.  Starting to have worsening pain that is affecting daily activities.  Difficulty even go up a flight of stairs without significant pain.  Does have some weakness but thinks is secondary to more of her back. Worsening pain of the hip as well.  The left hip seems to be worse.  Lateral more than the groin.  Affecting daily activities and waking her up at night.  Did respond well to injection back in June.     Past Medical History:  Diagnosis Date     Anemia    mild   Anxiety    Arthritis    Asthma    Cardiomyopathy ([place])    CHF (congestive heart failure) (HCC)    Diabetes mellitus without complication (HCC)    Frequency of urination    at night   GERD (gastroesophageal reflux disease)    potassium worsens reflux   Hemorrhoids, external    Hyperlipidemia    Hypertension    Neuromuscular disorder (HCC)    neuropathy   Numbness and tingling of both legs    Rheumatoid arthritis ([place])    Vertigo    hx of   Past Surgical History:  Procedure Laterality Date   ABDOMINAL HYSTERECTOMY     CARDIAC CATHETERIZATION N/A 10/28/2014   Procedure: Left Heart Cath and Coronary Angiography;  Surgeon: Jettie Booze, MD;  Location: [facility];  Service: Cardiovascular;  Laterality: N/A;   CARPAL TUNNEL RELEASE Left    COLONOSCOPY     pt states 11 yr ago in [place] had a colon with 2 polyps- one cecal polyp per pt. one ? location   COLONOSCOPY W/ POLYPECTOMY     CYST REMOVAL NECK     HEMORROIDECTOMY     MAXIMUM ACCESS (MAS)POSTERIOR LUMBAR INTERBODY FUSION (PLIF) 3 LEVEL N/A 10/08/2013   Procedure: FOR MAXIMUM ACCESS (MAS) POSTERIOR LUMBAR INTERBODY FUSION (PLIF) 3 LEVEL;  Surgeon: Erline Levine, MD;  Location: [facility];  Service: Neurosurgery;  Laterality: N/A;  L3-4 L4-5 L5-S1 maximum access posterior lumbar interbody fusion with decompression  TUBAL LIGATION     Social History   Socioeconomic History   Marital status: Single    Spouse name: Not on file   Number of children: Not on file   Years of education: Not on file   Highest education level: Not on file  Occupational History   Not on file  Tobacco Use   Smoking status: Former Smoker   Smokeless tobacco: Never Used  Scientific laboratory technician Use: Never used  Substance and Sexual Activity   Alcohol use: Yes    Comment: 3 x a year -rare    Drug use: No    Types: Marijuana   Sexual activity: Not Currently  Other Topics Concern    Not on file  Social History Narrative   Not on file   Social Determinants of Health   Financial Resource Strain:    Difficulty of Paying Living Expenses: Not on file  Food Insecurity:    Worried About [place] in the Last Year: Not on file   Ran Out of Food in the Last Year: Not on file  Transportation Needs:    Lack of Transportation (Medical): Not on file   Lack of Transportation (Non-Medical): Not on file  Physical Activity:    Days of Exercise per Week: Not on file   Minutes of Exercise per Session: Not on file  Stress:    Feeling of Stress : Not on file  Social Connections:    Frequency of Communication with Friends and Family: Not on file   Frequency of Social Gatherings with Friends and Family: Not on file   Attends Religious Services: Not on file   Active Member of Clubs or Organizations: Not on file   Attends Archivist Meetings: Not on file   Marital Status: Not on file   Allergies  Allergen Reactions   Lisinopril Cough   Family History  Problem Relation Age of Onset   Early death Father    Heart disease Father    Hypertension Sister    Hypertension Brother    Diabetes Brother    Colon polyps Mother    Hashimoto's thyroiditis Sister    Colon cancer Maternal Grandmother        in her 81s   Stomach cancer Maternal Aunt    Non-Hodgkin's lymphoma Sister    Alcohol abuse Neg Hx    COPD Neg Hx    Depression Neg Hx    Drug abuse Neg Hx    Hearing loss Neg Hx    Hyperlipidemia Neg Hx    Kidney disease Neg Hx    Stroke Neg Hx    Esophageal cancer Neg Hx    Rectal cancer Neg Hx     Current Outpatient Medications (Endocrine & Metabolic):    metFORMIN (GLUCOPHAGE) 500 MG tablet, Take 1 tablet (500 mg total) by mouth 2 (two) times daily with a meal. Annual appt due in Sept must see provider for future refills  Current Outpatient Medications (Cardiovascular):    atorvastatin (LIPITOR) 40 MG tablet, Take  1 tablet by mouth once daily   carvedilol (COREG) 6.25 MG tablet, TAKE 1 TABLET BY MOUTH TWICE DAILY WITH A MEAL   ENTRESTO 97-103 MG, Take 1 tablet by mouth twice daily   spironolactone (ALDACTONE) 25 MG tablet, TAKE 1 TABLET BY MOUTH ONCE DAILY **PLEASE  KEEP  UPCOMING  APPOINTMENT  IN  DECEMBER  FOR  FUTURE  REFILLS**  Current Outpatient Medications (Respiratory):    Albuterol  Sulfate (PROAIR RESPICLICK) 008 (90 Base) MCG/ACT AEPB, Inhale 2 puffs into the lungs 4 (four) times daily as needed.  Current Outpatient Medications (Analgesics):    allopurinol (ZYLOPRIM) 300 MG tablet, Take 1 tablet by mouth once daily   aspirin EC 81 MG tablet, Take 1 tablet (81 mg total) by mouth daily.   leflunomide (ARAVA) 20 MG tablet, Take 1 tablet (20 mg total) by mouth daily.   Current Outpatient Medications (Other):    Accu-Chek FastClix Lancets MISC, Inject 1 Act into the skin 3 (three) times daily.   Blood Glucose Monitoring Suppl (ACCU-CHEK GUIDE) w/Device KIT, 1 Act by Does not apply route 3 (three) times daily.   Calcium Carb-Cholecalciferol (CALCIUM 600 + D PO), Take 1 tablet by mouth daily.   citalopram (CELEXA) 20 MG tablet, Take 1 tablet by mouth once daily   DEXILANT 60 MG capsule, Take 1 capsule by mouth once daily   gabapentin (NEURONTIN) 600 MG tablet, TAKE 1 TABLET BY MOUTH THREE TIMES DAILY   glucose blood (ACCU-CHEK GUIDE) test strip, Use TID   Lancets Misc. (ACCU-CHEK FASTCLIX LANCET) KIT, Inject 1 Act into the skin 3 (three) times daily.   mirabegron ER (MYRBETRIQ) 50 MG TB24 tablet, Take 1 tablet (50 mg total) by mouth daily.   Omega-3 Fatty Acids (FISH OIL) 1200 MG CAPS, Take 1,200 mg by mouth daily.    vitamin C (ASCORBIC ACID) 500 MG tablet, Take 500 mg by mouth daily as needed (for cold symptoms).   Reviewed prior external information including notes and imaging from  primary care provider As well as notes that were available from care everywhere and other  healthcare systems.  Past medical history, social, surgical and family history all reviewed in electronic medical record.  No pertanent information unless stated regarding to the chief complaint.   Review of Systems:  No headache, visual changes, nausea, vomiting, diarrhea, constipation, dizziness, abdominal pain, skin rash, fevers, chills, night sweats, weight loss, swollen lymph nodes, body aches, joint swelling, chest pain, shortness of breath, mood changes. POSITIVE muscle aches  Objective  Blood pressure (!) 158/90, pulse 81, height _0  (1.626 m), weight 214 lb (97.1 kg), last menstrual period 05/29/1991, SpO2 98 %.   General: No apparent distress alert and oriented x3 mood and affect normal, dressed appropriately.  HEENT: Pupils equal, extraocular movements intact  Respiratory: Patient's speak in full sentences and does not appear short of breath  Cardiovascular: No lower extremity edema, non tender, no erythema  Severe pes planus of the feet bilaterally with overpronation of the hindfoot. Knee exam bilaterally shows tenderness to palpation of the medial joint line but severe patellofemoral narrowing with crepitus noted. Left hip severely tender to palpation over the lateral aspect.  Tightness noted with straight leg test.  Difficult to do [place]y Sox secondary to body habitus.   Procedure: Real-time Ultrasound Guided Injection of left  greater trochanteric bursitis secondary to patient's body habitus Device: GE Logiq Q7  Ultrasound guided injection is preferred based studies that show increased duration, increased effect, greater accuracy, decreased procedural pain, increased response rate, and decreased cost with ultrasound guided versus blind injection.  Verbal informed consent obtained.  Time-out conducted.  Noted no overlying erythema, induration, or other signs of local infection.  Skin prepped in a sterile fashion.  Local anesthesia: Topical Ethyl chloride.  With sterile technique  and under real time ultrasound guidance:  Greater trochanteric area was visualized and patient's bursa was noted. A 22-gauge 3 inch needle was inserted  and 4 cc of 0.5% Marcaine and 1 cc of Kenalog 40 mg/dL was injected. Pictures taken Completed without difficulty  Pain immediately resolved suggesting accurate placement of the medication.  Advised to call if fevers/chills, erythema, induration, drainage, or persistent bleeding.  Impression: Technically successful ultrasound guided injection.  After informed written and verbal consent, patient was seated on exam table. Right knee was prepped with alcohol swab and utilizing anterolateral approach, patient's right knee space was injected with 4:1  marcaine 0.5%: Kenalog 31m/dL. Patient tolerated the procedure well without immediate complications.  After informed written and verbal consent, patient was seated on exam table. Left knee was prepped with alcohol swab and utilizing anterolateral approach, patient's left knee space was injected with 4:1  marcaine 0.5%: Kenalog 423mdL. Patient tolerated the procedure well without immediate complications.   Impression and Recommendations:     The above documentation has been reviewed and is accurate and complete ZaLyndal PulleyDO

## 2020-04-25 NOTE — Patient Instructions (Addendum)
Good to see you Xray today Tart cherry extract 1200 mg daily Spenco orthotics "total support" See me again in 6-8 weeks if you are doing better see me in 3 months

## 2020-04-26 ENCOUNTER — Other Ambulatory Visit: Payer: Self-pay | Admitting: Physician Assistant

## 2020-04-26 ENCOUNTER — Other Ambulatory Visit: Payer: Self-pay | Admitting: Cardiology

## 2020-04-27 ENCOUNTER — Ambulatory Visit (HOSPITAL_COMMUNITY): Payer: Medicare Other | Attending: Cardiovascular Disease

## 2020-04-27 ENCOUNTER — Other Ambulatory Visit: Payer: Self-pay

## 2020-04-27 DIAGNOSIS — R9430 Abnormal result of cardiovascular function study, unspecified: Secondary | ICD-10-CM | POA: Diagnosis not present

## 2020-04-27 DIAGNOSIS — I5042 Chronic combined systolic (congestive) and diastolic (congestive) heart failure: Secondary | ICD-10-CM

## 2020-04-27 DIAGNOSIS — I428 Other cardiomyopathies: Secondary | ICD-10-CM

## 2020-04-27 DIAGNOSIS — IMO0002 Reserved for concepts with insufficient information to code with codable children: Secondary | ICD-10-CM

## 2020-04-27 LAB — ECHOCARDIOGRAM COMPLETE
Area-P 1/2: 3.72 cm2
S' Lateral: 4.4 cm
Single Plane A2C EF: 48.6 %
Single Plane A4C EF: 33.9 %

## 2020-04-27 MED ORDER — PERFLUTREN LIPID MICROSPHERE
1.0000 mL | INTRAVENOUS | Status: AC | PRN
  Administered 2020-04-27: 2 mL via INTRAVENOUS

## 2020-05-02 NOTE — Progress Notes (Deleted)
[place] Phone: [phone] Subjective:    I'm seeing this patient by the request  of:  [name], MD  CC:   [id number]   04/25/2020 Left-sided injected again today.  Tolerated the procedure well.  We will get pelvis x-ray to further evaluate but likely more secondary to the nerves in the back.  Patient is on many different medications including gabapentin at this time.  May need to consider the possibility of changing citalopram to Cymbalta for more pain control if this continues.  We will see her again in 6 to 8 weeks but cannot repeat injections for 12 weeks.  Repeat injections given today.  Severe arthritic changes.  X-rays ordered today to further evaluate.  Could be a candidate for viscosupplementation.  Patient will get over-the-counter orthotics that I think will be beneficial as well.  Icing regimen.  Follow-up again in 4 to 8 weeks  Update 05/03/2020 Emily Wheeler is a 64 y.o. female coming in with complaint of bilateral knee pain. Durolane authorized. Patient states     Past Medical History:  Diagnosis Date  . Anemia    mild  . Anxiety   . Arthritis   . Asthma   . Cardiomyopathy ([place])   . CHF (congestive heart failure) ([place])   . Diabetes mellitus without complication ([place])   . Frequency of urination    at night  . GERD (gastroesophageal reflux disease)    potassium worsens reflux  . Hemorrhoids, external   . Hyperlipidemia   . Hypertension   . Neuromuscular disorder ([facility])    neuropathy  . Numbness and tingling of both legs   . Rheumatoid arthritis ([place])   . Vertigo    hx of   Past Surgical History:  Procedure Laterality Date  . ABDOMINAL HYSTERECTOMY    . CARDIAC CATHETERIZATION N/A 10/28/2014   Procedure: Left Heart Cath and Coronary Angiography;  Surgeon: Jettie Booze, MD;  Location: [facility];  Service: Cardiovascular;  Laterality: N/A;  . CARPAL TUNNEL  RELEASE Left   . COLONOSCOPY     pt states 11 yr ago in [place] had a colon with 2 polyps- one cecal polyp per pt. one ? location  . COLONOSCOPY W/ POLYPECTOMY    . CYST REMOVAL NECK    . HEMORROIDECTOMY    . MAXIMUM ACCESS (MAS)POSTERIOR LUMBAR INTERBODY FUSION (PLIF) 3 LEVEL N/A 10/08/2013   Procedure: FOR MAXIMUM ACCESS (MAS) POSTERIOR LUMBAR INTERBODY FUSION (PLIF) 3 LEVEL;  Surgeon: Erline Levine, MD;  Location: [facility];  Service: Neurosurgery;  Laterality: N/A;  L3-4 L4-5 L5-S1 maximum access posterior lumbar interbody fusion with decompression  . TUBAL LIGATION     Social History   Socioeconomic History  . Marital status: Single    Spouse name: Not on file  . Number of children: Not on file  . Years of education: Not on file  . Highest education level: Not on file  Occupational History  . Not on file  Tobacco Use  . Smoking status: Former Research scientist (life sciences)  . Smokeless tobacco: Never Used  Vaping Use  . Vaping Use: Never used  Substance and Sexual Activity  . Alcohol use: Yes    Comment: 3 x a year -rare   . Drug use: No    Types: Marijuana  . Sexual activity: Not Currently  Other Topics Concern  . Not on file  Social History Narrative  . Not on  file   Social Determinants of Health   Financial Resource Strain:   . Difficulty of Paying Living Expenses: Not on file  Food Insecurity:   . Worried About Charity fundraiser in the Last Year: Not on file  . Ran Out of Food in the Last Year: Not on file  Transportation Needs:   . Lack of Transportation (Medical): Not on file  . Lack of Transportation (Non-Medical): Not on file  Physical Activity:   . Days of Exercise per Week: Not on file  . Minutes of Exercise per Session: Not on file  Stress:   . Feeling of Stress : Not on file  Social Connections:   . Frequency of Communication with Friends and Family: Not on file  . Frequency of Social Gatherings with Friends and Family: Not on file  . Attends Religious Services: Not on  file  . Active Member of Clubs or Organizations: Not on file  . Attends Archivist Meetings: Not on file  . Marital Status: Not on file   Allergies  Allergen Reactions  . Lisinopril Cough   Family History  Problem Relation Age of Onset  . Early death Father   . Heart disease Father   . Hypertension Sister   . Hypertension Brother   . Diabetes Brother   . Colon polyps Mother   . Hashimoto's thyroiditis Sister   . Colon cancer Maternal Grandmother        in her 37s  . Stomach cancer Maternal Aunt   . Non-Hodgkin's lymphoma Sister   . Alcohol abuse Neg Hx   . COPD Neg Hx   . Depression Neg Hx   . Drug abuse Neg Hx   . Hearing loss Neg Hx   . Hyperlipidemia Neg Hx   . Kidney disease Neg Hx   . Stroke Neg Hx   . Esophageal cancer Neg Hx   . Rectal cancer Neg Hx     Current Outpatient Medications (Endocrine & Metabolic):  .  metFORMIN (GLUCOPHAGE) 500 MG tablet, Take 1 tablet (500 mg total) by mouth 2 (two) times daily with a meal. Annual appt due in Sept must see provider for future refills  Current Outpatient Medications (Cardiovascular):  .  atorvastatin (LIPITOR) 40 MG tablet, Take 1 tablet by mouth once daily .  carvedilol (COREG) 6.25 MG tablet, TAKE 1 TABLET BY MOUTH TWICE DAILY WITH A MEAL .  ENTRESTO 97-103 MG, Take 1 tablet by mouth twice daily .  spironolactone (ALDACTONE) 25 MG tablet, Take 1 tablet (25 mg total) by mouth daily.  Current Outpatient Medications (Respiratory):  Marland Kitchen  Albuterol Sulfate (PROAIR RESPICLICK) 779 (90 Base) MCG/ACT AEPB, Inhale 2 puffs into the lungs 4 (four) times daily as needed.  Current Outpatient Medications (Analgesics):  .  allopurinol (ZYLOPRIM) 300 MG tablet, Take 1 tablet by mouth once daily .  aspirin EC 81 MG tablet, Take 1 tablet (81 mg total) by mouth daily. Marland Kitchen  leflunomide (ARAVA) 20 MG tablet, Take 1 tablet (20 mg total) by mouth daily.   Current Outpatient Medications (Other):  Marland Kitchen  Accu-Chek FastClix Lancets  MISC, Inject 1 Act into the skin 3 (three) times daily. .  Blood Glucose Monitoring Suppl (ACCU-CHEK GUIDE) w/Device KIT, 1 Act by Does not apply route 3 (three) times daily. .  Calcium Carb-Cholecalciferol (CALCIUM 600 + D PO), Take 1 tablet by mouth daily. .  citalopram (CELEXA) 20 MG tablet, Take 1 tablet by mouth once daily .  DEXILANT 60  MG capsule, Take 1 capsule by mouth once daily .  gabapentin (NEURONTIN) 600 MG tablet, TAKE 1 TABLET BY MOUTH THREE TIMES DAILY .  glucose blood (ACCU-CHEK GUIDE) test strip, Use TID .  Lancets Misc. (ACCU-CHEK FASTCLIX LANCET) KIT, Inject 1 Act into the skin 3 (three) times daily. .  mirabegron ER (MYRBETRIQ) 50 MG TB24 tablet, Take 1 tablet (50 mg total) by mouth daily. .  Omega-3 Fatty Acids (FISH OIL) 1200 MG CAPS, Take 1,200 mg by mouth daily.  .  vitamin C (ASCORBIC ACID) 500 MG tablet, Take 500 mg by mouth daily as needed (for cold symptoms).   Reviewed prior external information including notes and imaging from  primary care provider As well as notes that were available from care everywhere and other healthcare systems.  Past medical history, social, surgical and family history all reviewed in electronic medical record.  No pertanent information unless stated regarding to the chief complaint.   Review of Systems:  No headache, visual changes, nausea, vomiting, diarrhea, constipation, dizziness, abdominal pain, skin rash, fevers, chills, night sweats, weight loss, swollen lymph nodes, body aches, joint swelling, chest pain, shortness of breath, mood changes. POSITIVE muscle aches  Objective  Last menstrual period 05/29/1991.   General: No apparent distress alert and oriented x3 mood and affect normal, dressed appropriately.  HEENT: Pupils equal, extraocular movements intact  Respiratory: Patient's speak in full sentences and does not appear short of breath  Cardiovascular: No lower extremity edema, non tender, no erythema  Neuro: Cranial  nerves II through XII are intact, neurovascularly intact in all extremities with 2+ DTRs and 2+ pulses.  Gait normal with good balance and coordination.  MSK:  Non tender with full range of motion and good stability and symmetric strength and tone of shoulders, elbows, wrist, hip, knee and ankles bilaterally.     Impression and Recommendations:     The above documentation has been reviewed and is accurate and complete Jacqualin Combes

## 2020-05-03 ENCOUNTER — Ambulatory Visit: Payer: Medicare Other | Admitting: Family Medicine

## 2020-05-05 NOTE — Progress Notes (Signed)
Cardiology Office Note:    Date:  05/06/2020   ID:  Emily Wheeler, DOB 01-25-1956, MRN [id number]  PCP:  [name], MD  [facility] Cardiologist:  Ena Dawley, MD   [facility] Electrophysiologist:  None   Referring MD: [name], MD   Chief Complaint:  Follow-up (CHF)    Patient Profile:    Emily Wheeler is a 64 y.o. female with:   Heart failure with reduced ejection fraction  ? Nonischemic cardiomyopathy ? 03/2016: EF 35-40 ? 03/2018: EF 35-40 ? Echo 12/21: EF 40-45  Coronary artery disease ? Mild to moderate nonobstructive disease by cath 2016  Diabetes mellitus  Rheumatoid arthritis  Hypertension  Hyperlipidemia   Prior CV studies: ABIs 11/2019 Normal  Echocardiogram 04/27/20 EF 40-45, Gr 1 DD, normal RVSF  Echocardiogram 12/25/19 EF 30-35, normal RVSF, trivial MR  Echocardiogram 04/02/2018 EF 35-40, moderate diffuse HK, grade 1 diastolic dysfunction, mildly calcified aortic valve leaflets, atrial septal lipomatous hypertrophy, trivial PI  ABIs 10/31/2016 Normal   Echocardiogram 04/10/2016 EF 35-40  Cardiac catheterization 10/28/2014 LAD mild diffuse disease RI irregularities LCx ostial 50, mid 25 RCA mild disease; RPDA 60 (small vessel) EF 35-45, LVEDP 18  History of Present Illness:    [name] was last seen by Dr. Meda Coffee in 6/21.  A f/u echocardiogram earlier this month demonstrated improved EF at 40-45.  She returns for f/u.  She is here alone.  Overall, she has been doing well.  She still notes shortness of breath with moderate activities.  She has not had chest discomfort, syncope or leg swelling.      Past Medical History:  Diagnosis Date  . Anemia    mild  . Anxiety   . Arthritis   . Asthma   . Cardiomyopathy ([place])   . CHF (congestive heart failure) ([place])   . Diabetes mellitus without complication ([place])   . Frequency of urination    at night  . GERD (gastroesophageal reflux disease)    potassium  worsens reflux  . Hemorrhoids, external   . Hyperlipidemia   . Hypertension   . Neuromuscular disorder (HCC)    neuropathy  . Numbness and tingling of both legs   . Rheumatoid arthritis ([place])   . Vertigo    hx of    Current Medications: Current Meds  Medication Sig  . Accu-Chek FastClix Lancets MISC Inject 1 Act into the skin 3 (three) times daily.  . Albuterol Sulfate (PROAIR RESPICLICK) 867 (90 Base) MCG/ACT AEPB Inhale 2 puffs into the lungs 4 (four) times daily as needed.  Marland Kitchen allopurinol (ZYLOPRIM) 300 MG tablet Take 1 tablet by mouth once daily  . aspirin EC 81 MG tablet Take 1 tablet (81 mg total) by mouth daily.  Marland Kitchen atorvastatin (LIPITOR) 40 MG tablet Take 40 mg by mouth daily.  . Blood Glucose Monitoring Suppl (ACCU-CHEK GUIDE) w/Device KIT 1 Act by Does not apply route 3 (three) times daily.  . Calcium Carb-Cholecalciferol (CALCIUM 600 + D PO) Take 1 tablet by mouth daily.  . carvedilol (COREG) 6.25 MG tablet TAKE 1 TABLET BY MOUTH TWICE DAILY WITH A MEAL  . citalopram (CELEXA) 20 MG tablet Take 1 tablet by mouth once daily  . dexlansoprazole (DEXILANT) 60 MG capsule Take 60 mg by mouth daily.  Marland Kitchen ENTRESTO 97-103 MG Take 1 tablet by mouth twice daily  . gabapentin (NEURONTIN) 600 MG tablet Take 600 mg by mouth 3 (three) times daily.  Marland Kitchen glucose blood (ACCU-CHEK  GUIDE) test strip Use TID  . Lancets Misc. (ACCU-CHEK FASTCLIX LANCET) KIT Inject 1 Act into the skin 3 (three) times daily.  Marland Kitchen leflunomide (ARAVA) 20 MG tablet Take 1 tablet (20 mg total) by mouth daily.  . metFORMIN (GLUCOPHAGE) 500 MG tablet Take 1 tablet (500 mg total) by mouth 2 (two) times daily with a meal. Annual appt due in Sept must see provider for future refills  . mirabegron ER (MYRBETRIQ) 50 MG TB24 tablet Take 1 tablet (50 mg total) by mouth daily.  . Omega-3 Fatty Acids (FISH OIL) 1200 MG CAPS Take 1,200 mg by mouth daily.   Marland Kitchen spironolactone (ALDACTONE) 25 MG tablet Take 1 tablet (25 mg total) by mouth  daily.  . Tart Cherry 1200 MG CAPS Take 1 capsule by mouth daily.  . Turmeric 500 MG CAPS Take 1 capsule by mouth daily.  . vitamin C (ASCORBIC ACID) 500 MG tablet Take 500 mg by mouth daily as needed (for cold symptoms).  . [DISCONTINUED] atorvastatin (LIPITOR) 40 MG tablet Take 40 mg by mouth daily.     Allergies:   Lisinopril   Social History   Tobacco Use  . Smoking status: Former Research scientist (life sciences)  . Smokeless tobacco: Never Used  Vaping Use  . Vaping Use: Never used  Substance Use Topics  . Alcohol use: Yes    Comment: 3 x a year -rare   . Drug use: No    Types: Marijuana     Family Hx: The patient's family history includes Colon cancer in her maternal grandmother; Colon polyps in her mother; Diabetes in her brother; Early death in her father; Hashimoto's thyroiditis in her sister; Heart disease in her father; Hypertension in her brother and sister; Non-Hodgkin's lymphoma in her sister; Stomach cancer in her maternal aunt. There is no history of Alcohol abuse, COPD, Depression, Drug abuse, Hearing loss, Hyperlipidemia, Kidney disease, Stroke, Esophageal cancer, or Rectal cancer.  Review of Systems  Gastrointestinal: Negative for hematochezia and melena.     EKGs/Labs/Other Test Reviewed:    EKG:  EKG is   ordered today.  The ekg ordered today demonstrates normal sinus rhythm, increased artifact, HR 64, LAFB, IVCD, nonspecific ST-T wave changes, QTC 408, no change from prior tracing  Recent Labs: 11/09/2019: ALT 12; TSH 0.97 04/18/2020: BUN 15; Creatinine, Ser 1.05; Hemoglobin 10.2; Platelets 234.0; Potassium 4.3; Sodium 138   Recent Lipid Panel Lab Results  Component Value Date/Time   CHOL 143 11/09/2019 11:56 AM   TRIG 120.0 11/09/2019 11:56 AM   HDL 49.00 11/09/2019 11:56 AM   CHOLHDL 3 11/09/2019 11:56 AM   LDLCALC 70 11/09/2019 11:56 AM   LDLDIRECT 162.5 01/30/2013 04:02 PM     Risk Assessment/Calculations:      Physical Exam:    VS:  BP 126/80   Pulse 64   Ht  5' 4"  (1.626 m)   Wt 212 lb (96.2 kg)   LMP 05/29/1991   SpO2 99%   BMI 36.39 kg/m     Wt Readings from Last 3 Encounters:  05/06/20 212 lb (96.2 kg)  04/25/20 214 lb (97.1 kg)  04/18/20 214 lb (97.1 kg)     Constitutional:      Appearance: Healthy appearance. Not in distress.  Neck:     Thyroid: No thyromegaly.     Vascular: JVD normal.  Pulmonary:     Effort: Pulmonary effort is normal.     Breath sounds: No wheezing. No rales.  Cardiovascular:     Normal rate.  Regular rhythm. Normal S1. Normal S2.     Murmurs: There is no murmur.  Edema:    Peripheral edema absent.  Abdominal:     Palpations: Abdomen is soft. There is no hepatomegaly.  Skin:    General: Skin is warm and dry.  Neurological:     Mental Status: Alert and oriented to person, place and time.     Cranial Nerves: Cranial nerves are intact.      ASSESSMENT & PLAN:    1. HFrEF (heart failure with reduced ejection fraction) (HCC) EF 40-45.  NYHA IIb.  Non-ischemic cardiomyopathy.  Volume status appears stable.  Continue current dose of Entresto, spironolactone, carvedilol.  We discussed the benefits of SGLT2 inhibitors (dapagliflozin) in patients with heart failure with reduced EF.  I think her symptoms of shortness of breath will improve on SGLT2i.  I have recommended starting dapaglifozin 10 mg once daily.  Samples and a Rx card were given. We discussed the importance of monitoring for GU infections.  We also discussed the importance of monitoring blood sugars.  Obtain BMET in 1 month.  Follow-up with Dr. Meda Coffee in 6 months.  2. NICM (nonischemic cardiomyopathy) ([place]) EF has improved to 40-45.  Therefore, she does not require referral to EP for ICD implantation.  3. Essential hypertension, benign The patient's blood pressure is controlled on her current regimen.  Continue current therapy.   4. Type II diabetes mellitus with manifestations ([place]) She is already on Metformin.  I advised her that there is no  significant risk of hypoglycemia with dapaglifozin  But, it is important to monitor sugars to ensure she is not having any issues.      Dispo:  Return in about 6 months (around 11/04/2020) for Routine Follow Up w/ Dr. Meda Coffee, in person.   Medication Adjustments/Labs and Tests Ordered: Current medicines are reviewed at length with the patient today.  Concerns regarding medicines are outlined above.  Tests Ordered: Orders Placed This Encounter  Procedures  . Basic metabolic panel  . EKG 12-Lead   Medication Changes: Meds ordered this encounter  Medications  . dapagliflozin propanediol (FARXIGA) 10 MG TABS tablet    Sig: Take 1 tablet (10 mg total) by mouth daily before breakfast.    Dispense:  30 tablet    Refill:  [address] Richardson Dopp, PA-C  05/06/2020 12:05 PM    [facility] [place], [place], [place]  [address] Phone: [phone]; Fax: [phone]

## 2020-05-06 ENCOUNTER — Other Ambulatory Visit: Payer: Self-pay

## 2020-05-06 ENCOUNTER — Encounter: Payer: Self-pay | Admitting: Physician Assistant

## 2020-05-06 ENCOUNTER — Ambulatory Visit (INDEPENDENT_AMBULATORY_CARE_PROVIDER_SITE_OTHER): Payer: Medicare Other | Admitting: Physician Assistant

## 2020-05-06 VITALS — BP 126/80 | HR 64 | Ht 64.0 in | Wt 212.0 lb

## 2020-05-06 DIAGNOSIS — I10 Essential (primary) hypertension: Secondary | ICD-10-CM | POA: Diagnosis not present

## 2020-05-06 DIAGNOSIS — I5020 Unspecified systolic (congestive) heart failure: Secondary | ICD-10-CM | POA: Diagnosis not present

## 2020-05-06 DIAGNOSIS — I428 Other cardiomyopathies: Secondary | ICD-10-CM

## 2020-05-06 DIAGNOSIS — E118 Type 2 diabetes mellitus with unspecified complications: Secondary | ICD-10-CM

## 2020-05-06 MED ORDER — DAPAGLIFLOZIN PROPANEDIOL 10 MG PO TABS: 10.0000 mg | ORAL_TABLET | Freq: Every day | ORAL | 12 refills | Status: DC

## 2020-05-06 NOTE — Patient Instructions (Addendum)
Medication Instructions:  Your physician has recommended you make the following change in your medication:   1) Start Farxiga 10 mg, 1 tablet by mouth once a day  *If you need a refill on your cardiac medications before your next appointment, please call your pharmacy*  Lab Work: Your physician recommends that you return for lab work in: 4 weeks on 06/03/2020 **The lab is open from 7:30AM-4:30PM** you may come anytime between these hours and you do not need to be fasting  Testing/Procedures: None ordered today  Follow-Up: At [facility], you and your health needs are our priority.  As part of our continuing mission to provide you with exceptional heart care, we have created designated Provider Care Teams.  These Care Teams include your primary Cardiologist (physician) and Advanced Practice Providers (APPs -  Physician Assistants and Nurse Practitioners) who all work together to provide you with the care you need, when you need it.  Your next appointment:   6 month(s)  The format for your next appointment:   In Person  Provider:   Tobias Alexander, MD

## 2020-05-09 ENCOUNTER — Other Ambulatory Visit: Payer: Self-pay | Admitting: Internal Medicine

## 2020-05-09 DIAGNOSIS — M069 Rheumatoid arthritis, unspecified: Secondary | ICD-10-CM

## 2020-05-10 NOTE — Progress Notes (Signed)
[facility] [address] [place] Phone: [phone] Subjective:   I Emily Wheeler am serving as a Education administrator for Dr. Hulan Saas.  This visit occurred during the SARS-CoV-2 public health emergency.  Safety protocols were in place, including screening questions prior to the visit, additional usage of staff PPE, and extensive cleaning of exam room while observing appropriate contact time as indicated for disinfecting solutions.   I'm seeing this patient by the request  of:  [name], MD  CC: Bilateral knee pain  IOX:[id number]   04/25/2020 Repeat injections given today.  Severe arthritic changes.  X-rays ordered today to further evaluate.  Could be a candidate for viscosupplementation.  Patient will get over-the-counter orthotics that I think will be beneficial as well.  Icing regimen.  Follow-up again in 4 to 8 weeks  Update 05/11/2020 Emily Wheeler is a 64 y.o. female coming in with complaint of bilateral knee pain. Patient here for durolane injections for both knees. States the knees are doing ok. No more fluid on the knees and they are doing better.  Patient states that still would like to have the gel injections.  Patient states that they have felt better since the injections.  Continues to have back pain but seems to be at her normal amount of pain.      Past Medical History:  Diagnosis Date  . Anemia    mild  . Anxiety   . Arthritis   . Asthma   . Cardiomyopathy ([place])   . CHF (congestive heart failure) ([place])   . Diabetes mellitus without complication ([place])   . Frequency of urination    at night  . GERD (gastroesophageal reflux disease)    potassium worsens reflux  . Hemorrhoids, external   . Hyperlipidemia   . Hypertension   . Neuromuscular disorder (HCC)    neuropathy  . Numbness and tingling of both legs   . Rheumatoid arthritis ([place])   . Vertigo    hx of   Past Surgical History:  Procedure Laterality Date   . ABDOMINAL HYSTERECTOMY    . CARDIAC CATHETERIZATION N/A 10/28/2014   Procedure: Left Heart Cath and Coronary Angiography;  Surgeon: Jettie Booze, MD;  Location: [facility];  Service: Cardiovascular;  Laterality: N/A;  . CARPAL TUNNEL RELEASE Left   . COLONOSCOPY     pt states 11 yr ago in [place] had a colon with 2 polyps- one cecal polyp per pt. one ? location  . COLONOSCOPY W/ POLYPECTOMY    . CYST REMOVAL NECK    . HEMORROIDECTOMY    . MAXIMUM ACCESS (MAS)POSTERIOR LUMBAR INTERBODY FUSION (PLIF) 3 LEVEL N/A 10/08/2013   Procedure: FOR MAXIMUM ACCESS (MAS) POSTERIOR LUMBAR INTERBODY FUSION (PLIF) 3 LEVEL;  Surgeon: Erline Levine, MD;  Location: [facility];  Service: Neurosurgery;  Laterality: N/A;  L3-4 L4-5 L5-S1 maximum access posterior lumbar interbody fusion with decompression  . TUBAL LIGATION     Social History   Socioeconomic History  . Marital status: Single    Spouse name: Not on file  . Number of children: Not on file  . Years of education: Not on file  . Highest education level: Not on file  Occupational History  . Not on file  Tobacco Use  . Smoking status: Former Research scientist (life sciences)  . Smokeless tobacco: Never Used  Vaping Use  . Vaping Use: Never used  Substance and Sexual Activity  . Alcohol use: Yes  Comment: 3 x a year -rare   . Drug use: No    Types: Marijuana  . Sexual activity: Not Currently  Other Topics Concern  . Not on file  Social History Narrative  . Not on file   Social Determinants of Health   Financial Resource Strain: Not on file  Food Insecurity: Not on file  Transportation Needs: Not on file  Physical Activity: Not on file  Stress: Not on file  Social Connections: Not on file   Allergies  Allergen Reactions  . Lisinopril Cough   Family History  Problem Relation Age of Onset  . Early death Father   . Heart disease Father   . Hypertension Sister   . Hypertension Brother   . Diabetes Brother   . Colon polyps Mother   .  Hashimoto's thyroiditis Sister   . Colon cancer Maternal Grandmother        in her 28s  . Stomach cancer Maternal Aunt   . Non-Hodgkin's lymphoma Sister   . Alcohol abuse Neg Hx   . COPD Neg Hx   . Depression Neg Hx   . Drug abuse Neg Hx   . Hearing loss Neg Hx   . Hyperlipidemia Neg Hx   . Kidney disease Neg Hx   . Stroke Neg Hx   . Esophageal cancer Neg Hx   . Rectal cancer Neg Hx     Current Outpatient Medications (Endocrine & Metabolic):  .  dapagliflozin propanediol (FARXIGA) 10 MG TABS tablet, Take 1 tablet (10 mg total) by mouth daily before breakfast. .  metFORMIN (GLUCOPHAGE) 500 MG tablet, Take 1 tablet (500 mg total) by mouth 2 (two) times daily with a meal. Annual appt due in Sept must see provider for future refills  Current Outpatient Medications (Cardiovascular):  .  atorvastatin (LIPITOR) 40 MG tablet, Take 40 mg by mouth daily. .  carvedilol (COREG) 6.25 MG tablet, TAKE 1 TABLET BY MOUTH TWICE DAILY WITH A MEAL .  ENTRESTO 97-103 MG, Take 1 tablet by mouth twice daily .  spironolactone (ALDACTONE) 25 MG tablet, Take 1 tablet (25 mg total) by mouth daily.  Current Outpatient Medications (Respiratory):  Marland Kitchen  Albuterol Sulfate (PROAIR RESPICLICK) 284 (90 Base) MCG/ACT AEPB, Inhale 2 puffs into the lungs 4 (four) times daily as needed.  Current Outpatient Medications (Analgesics):  .  allopurinol (ZYLOPRIM) 300 MG tablet, Take 1 tablet by mouth once daily .  aspirin EC 81 MG tablet, Take 1 tablet (81 mg total) by mouth daily. Marland Kitchen  leflunomide (ARAVA) 20 MG tablet, Take 1 tablet by mouth once daily   Current Outpatient Medications (Other):  Marland Kitchen  Accu-Chek FastClix Lancets MISC, Inject 1 Act into the skin 3 (three) times daily. .  Blood Glucose Monitoring Suppl (ACCU-CHEK GUIDE) w/Device KIT, 1 Act by Does not apply route 3 (three) times daily. .  Calcium Carb-Cholecalciferol (CALCIUM 600 + D PO), Take 1 tablet by mouth daily. .  citalopram (CELEXA) 20 MG tablet, Take 1  tablet by mouth once daily .  dexlansoprazole (DEXILANT) 60 MG capsule, Take 60 mg by mouth daily. Marland Kitchen  gabapentin (NEURONTIN) 600 MG tablet, Take 600 mg by mouth 3 (three) times daily. Marland Kitchen  glucose blood (ACCU-CHEK GUIDE) test strip, Use TID .  Lancets Misc. (ACCU-CHEK FASTCLIX LANCET) KIT, Inject 1 Act into the skin 3 (three) times daily. .  mirabegron ER (MYRBETRIQ) 50 MG TB24 tablet, Take 1 tablet (50 mg total) by mouth daily. .  Omega-3 Fatty Acids (FISH  OIL) 1200 MG CAPS, Take 1,200 mg by mouth daily.  Teena [place] Cherry 1200 MG CAPS, Take 1 capsule by mouth daily. .  Turmeric 500 MG CAPS, Take 1 capsule by mouth daily. .  vitamin C (ASCORBIC ACID) 500 MG tablet, Take 500 mg by mouth daily as needed (for cold symptoms).   Reviewed prior external information including notes and imaging from  primary care provider As well as notes that were available from care everywhere and other healthcare systems.  Past medical history, social, surgical and family history all reviewed in electronic medical record.  No pertanent information unless stated regarding to the chief complaint.   Review of Systems:  No headache, visual changes, nausea, vomiting, diarrhea, constipation, dizziness, abdominal pain, skin rash, fevers, chills, night sweats, weight loss, swollen lymph nodes, body aches, joint swelling, chest pain, shortness of breath, mood changes. POSITIVE muscle aches  Objective  Blood pressure (!) 160/80, pulse 85, height 5' 4"  (1.626 m), weight 210 lb (95.3 kg), last menstrual period 05/29/1991, SpO2 94 %.   General: No apparent distress alert and oriented x3 mood and affect normal, dressed appropriately.  HEENT: Pupils equal, extraocular movements intact  Respiratory: Patient's speak in full sentences and does not appear short of breath  Cardiovascular: No lower extremity edema, non tender, no erythema  Severely antalgic Knee: Bilateral valgus deformity noted.  Abnormal thigh to calf ratio.   Tender to palpation over medial and PF joint line.  ROM full in flexion and extension and lower leg rotation. instability with valgus force.  painful patellar compression. Patellar glide with moderate crepitus. Patellar and quadriceps tendons unremarkable. Hamstring and quadriceps strength is normal.  After informed written and verbal consent, patient was seated on exam table. Right knee was prepped with alcohol swab and utilizing anterolateral approach, patient's right knee space was injected with 60 mg per 3 mL of Durolane  (sodium hyaluronate) in a prefilled syringe was injected easily into the knee through a 22-gauge needle..Patient tolerated the procedure well without immediate complications.  After informed written and verbal consent, patient was seated on exam table. Left knee was prepped with alcohol swab and utilizing anterolateral approach, patient's left knee space was injected with 60 mg per 3 mL of Durolane (sodium hyaluronate) in a prefilled syringe was injected easily into the knee through a 22-gauge needle..Patient tolerated the procedure well without immediate complications.    Impression and Recommendations:     The above documentation has been reviewed and is accurate and complete Lyndal Pulley, DO

## 2020-05-11 ENCOUNTER — Other Ambulatory Visit: Payer: Self-pay

## 2020-05-11 ENCOUNTER — Ambulatory Visit (INDEPENDENT_AMBULATORY_CARE_PROVIDER_SITE_OTHER): Payer: Medicare Other | Admitting: Family Medicine

## 2020-05-11 ENCOUNTER — Encounter: Payer: Self-pay | Admitting: Family Medicine

## 2020-05-11 DIAGNOSIS — M069 Rheumatoid arthritis, unspecified: Secondary | ICD-10-CM | POA: Diagnosis not present

## 2020-05-11 DIAGNOSIS — M170 Bilateral primary osteoarthritis of knee: Secondary | ICD-10-CM | POA: Diagnosis not present

## 2020-05-11 NOTE — Assessment & Plan Note (Signed)
Patient given viscosupplementation.  Tolerated procedure well.  Discussed icing regimen at home exercise, encourage icing regimen patient knows about weight.  May need surgical intervention at some point but does have significant back difficulties as well.  Follow-up with me again in 2 months

## 2020-05-11 NOTE — Assessment & Plan Note (Signed)
Patient does have significant rheumatoid arthritis.  Has not seen her rheumatologist for 2 years.  We encouraged her to follow-up otherwise we discussed potential new referral.  Patient did not want one today.

## 2020-05-11 NOTE — Patient Instructions (Addendum)
Good to see you See me again in 2 months 

## 2020-05-12 ENCOUNTER — Ambulatory Visit: Payer: Medicare Other | Admitting: Cardiology

## 2020-05-23 ENCOUNTER — Other Ambulatory Visit: Payer: Self-pay | Admitting: Internal Medicine

## 2020-05-30 ENCOUNTER — Other Ambulatory Visit: Payer: Self-pay | Admitting: Internal Medicine

## 2020-05-30 DIAGNOSIS — J45990 Exercise induced bronchospasm: Secondary | ICD-10-CM

## 2020-06-03 ENCOUNTER — Other Ambulatory Visit: Payer: Medicare Other

## 2020-06-06 ENCOUNTER — Other Ambulatory Visit: Payer: Self-pay | Admitting: Internal Medicine

## 2020-06-08 ENCOUNTER — Other Ambulatory Visit: Payer: Self-pay | Admitting: Family Medicine

## 2020-06-08 ENCOUNTER — Other Ambulatory Visit: Payer: Self-pay

## 2020-06-08 ENCOUNTER — Other Ambulatory Visit: Payer: Medicare Other

## 2020-06-08 DIAGNOSIS — E118 Type 2 diabetes mellitus with unspecified complications: Secondary | ICD-10-CM

## 2020-06-08 DIAGNOSIS — I428 Other cardiomyopathies: Secondary | ICD-10-CM | POA: Diagnosis not present

## 2020-06-08 DIAGNOSIS — I5020 Unspecified systolic (congestive) heart failure: Secondary | ICD-10-CM

## 2020-06-08 DIAGNOSIS — I10 Essential (primary) hypertension: Secondary | ICD-10-CM

## 2020-06-08 LAB — BASIC METABOLIC PANEL
BUN/Creatinine Ratio: 11 — ABNORMAL LOW (ref 12–28)
BUN: 15 mg/dL (ref 8–27)
CO2: 19 mmol/L — ABNORMAL LOW (ref 20–29)
Calcium: 9.1 mg/dL (ref 8.7–10.3)
Chloride: 102 mmol/L (ref 96–106)
Creatinine, Ser: 1.33 mg/dL — ABNORMAL HIGH (ref 0.57–1.00)
GFR calc Af Amer: 48 mL/min/{1.73_m2} — ABNORMAL LOW (ref >59)
GFR calc non Af Amer: 42 mL/min/{1.73_m2} — ABNORMAL LOW (ref >59)
Glucose: 102 mg/dL — ABNORMAL HIGH (ref 65–99)
Potassium: 4.9 mmol/L (ref 3.5–5.2)
Sodium: 137 mmol/L (ref 134–144)

## 2020-06-09 ENCOUNTER — Telehealth: Payer: Self-pay

## 2020-06-09 DIAGNOSIS — Z79899 Other long term (current) drug therapy: Secondary | ICD-10-CM

## 2020-06-09 DIAGNOSIS — R0602 Shortness of breath: Secondary | ICD-10-CM

## 2020-06-09 DIAGNOSIS — I428 Other cardiomyopathies: Secondary | ICD-10-CM

## 2020-06-09 MED ORDER — SPIRONOLACTONE 25 MG PO TABS: 12.5000 mg | ORAL_TABLET | Freq: Every day | ORAL | 3 refills | Status: DC

## 2020-06-09 NOTE — Telephone Encounter (Signed)
-----   Message from Beatrice Lecher, PA-C sent at 06/09/2020 10:53 AM EST ----- Creatinine increased.  K+ normal.   PLAN:  -Decrease spironolactone to 12.5 mg daily -Repeat BMET 1 week Tereso Newcomer, PA-C    06/09/2020 10:50 AM

## 2020-06-09 NOTE — Telephone Encounter (Signed)
Advised the pt of her lab result and she  will return next Thursday for her repeat labs.

## 2020-06-16 ENCOUNTER — Other Ambulatory Visit: Payer: Medicare Other

## 2020-06-19 ENCOUNTER — Other Ambulatory Visit: Payer: Self-pay | Admitting: Physician Assistant

## 2020-06-19 DIAGNOSIS — I10 Essential (primary) hypertension: Secondary | ICD-10-CM

## 2020-06-21 ENCOUNTER — Other Ambulatory Visit: Payer: Self-pay

## 2020-06-21 ENCOUNTER — Other Ambulatory Visit: Payer: Medicare Other | Admitting: *Deleted

## 2020-06-21 DIAGNOSIS — Z79899 Other long term (current) drug therapy: Secondary | ICD-10-CM

## 2020-06-21 DIAGNOSIS — R0602 Shortness of breath: Secondary | ICD-10-CM | POA: Diagnosis not present

## 2020-06-21 DIAGNOSIS — I428 Other cardiomyopathies: Secondary | ICD-10-CM | POA: Diagnosis not present

## 2020-06-22 LAB — BASIC METABOLIC PANEL
BUN/Creatinine Ratio: 13 (ref 12–28)
BUN: 17 mg/dL (ref 8–27)
CO2: 23 mmol/L (ref 20–29)
Calcium: 9 mg/dL (ref 8.7–10.3)
Chloride: 105 mmol/L (ref 96–106)
Creatinine, Ser: 1.27 mg/dL — ABNORMAL HIGH (ref 0.57–1.00)
GFR calc Af Amer: 51 mL/min/{1.73_m2} — ABNORMAL LOW (ref >59)
GFR calc non Af Amer: 44 mL/min/{1.73_m2} — ABNORMAL LOW (ref >59)
Glucose: 116 mg/dL — ABNORMAL HIGH (ref 65–99)
Potassium: 4.2 mmol/L (ref 3.5–5.2)
Sodium: 141 mmol/L (ref 134–144)

## 2020-07-02 ENCOUNTER — Other Ambulatory Visit: Payer: Self-pay | Admitting: Internal Medicine

## 2020-07-02 DIAGNOSIS — E118 Type 2 diabetes mellitus with unspecified complications: Secondary | ICD-10-CM

## 2020-07-03 ENCOUNTER — Other Ambulatory Visit: Payer: Self-pay | Admitting: Physician Assistant

## 2020-07-03 DIAGNOSIS — I10 Essential (primary) hypertension: Secondary | ICD-10-CM

## 2020-07-05 ENCOUNTER — Other Ambulatory Visit: Payer: Self-pay | Admitting: Internal Medicine

## 2020-07-05 DIAGNOSIS — I10 Essential (primary) hypertension: Secondary | ICD-10-CM

## 2020-07-12 ENCOUNTER — Ambulatory Visit: Payer: Medicare Other | Admitting: Family Medicine

## 2020-07-19 ENCOUNTER — Other Ambulatory Visit: Payer: Self-pay

## 2020-07-19 ENCOUNTER — Ambulatory Visit (INDEPENDENT_AMBULATORY_CARE_PROVIDER_SITE_OTHER): Payer: Medicare Other

## 2020-07-19 VITALS — BP 128/80 | HR 60 | Temp 98.3°F | Ht 64.0 in | Wt 216.0 lb

## 2020-07-19 DIAGNOSIS — Z0000 Encounter for general adult medical examination without abnormal findings: Secondary | ICD-10-CM

## 2020-07-19 NOTE — Patient Instructions (Addendum)
Emily Wheeler , Thank you for taking time to come for your Medicare Wellness Visit. I appreciate your ongoing commitment to your health goals. Please review the following plan we discussed and let me know if I can assist you in the future.   Screening recommendations/referrals: Colonoscopy: 10/11/2015; due every 10 years Mammogram: 05/13/2019; due every 1-2 years Bone Density: never done Recommended yearly ophthalmology/optometry visit for glaucoma screening and checkup Recommended yearly dental visit for hygiene and checkup  Vaccinations: Influenza vaccine: 04/18/2020 Pneumococcal vaccine: up to date Tdap vaccine: 01/10/2011; due every 10 years Shingles vaccine: never done; can check with local pharmacy for vaccine and price.   Covid-19: 08/08/2019, 09/02/2019, 05/18/2020  Advanced directives: Advance directive discussed with you today. Even though you declined this today please call our office should you change your mind and we can give you the proper paperwork for you to fill out.  Conditions/risks identified: Yes; Reviewed health maintenance screenings with patient today and relevant education, vaccines, and/or referrals were provided. Please continue to do your personal lifestyle choices by: daily care of teeth and gums, regular physical activity (goal should be 5 days a week for 30 minutes), eat a healthy diet, avoid tobacco and drug use, limiting any alcohol intake, taking a low-dose aspirin (if not allergic or have been advised by your provider otherwise) and taking vitamins and minerals as recommended by your provider. Continue doing brain stimulating activities (puzzles, reading, adult coloring books, staying active) to keep memory sharp. Continue to eat heart healthy diet (full of fruits, vegetables, whole grains, lean protein, water--limit salt, fat, and sugar intake) and increase physical activity as tolerated.  Next appointment: Please schedule your next Medicare Wellness Visit with your  Nurse Health Advisor in 1 year by calling [phone].  Preventive Care 19 Years and Older, Female Preventive care refers to lifestyle choices and visits with your health care provider that can promote health and wellness. What does preventive care include?  A yearly physical exam. This is also called an annual well check.  Dental exams once or twice a year.  Routine eye exams. Ask your health care provider how often you should have your eyes checked.  Personal lifestyle choices, including:  Daily care of your teeth and gums.  Regular physical activity.  Eating a healthy diet.  Avoiding tobacco and drug use.  Limiting alcohol use.  Practicing safe sex.  Taking low-dose aspirin every day.  Taking vitamin and mineral supplements as recommended by your health care provider. What happens during an annual well check? The services and screenings done by your health care provider during your annual well check will depend on your age, overall health, lifestyle risk factors, and family history of disease. Counseling  Your health care provider may ask you questions about your:  Alcohol use.  Tobacco use.  Drug use.  Emotional well-being.  Home and relationship well-being.  Sexual activity.  Eating habits.  History of falls.  Memory and ability to understand (cognition).  Work and work Astronomer.  Reproductive health. Screening  You may have the following tests or measurements:  Height, weight, and BMI.  Blood pressure.  Lipid and cholesterol levels. These may be checked every 5 years, or more frequently if you are over 2 years old.  Skin check.  Lung cancer screening. You may have this screening every year starting at age 38 if you have a 30-pack-year history of smoking and currently smoke or have quit within the past 15 years.  Fecal occult blood test (  FOBT) of the stool. You may have this test every year starting at age 27.  Flexible sigmoidoscopy or  colonoscopy. You may have a sigmoidoscopy every 5 years or a colonoscopy every 10 years starting at age 27.  Hepatitis C blood test.  Hepatitis B blood test.  Sexually transmitted disease (STD) testing.  Diabetes screening. This is done by checking your blood sugar (glucose) after you have not eaten for a while (fasting). You may have this done every 1-3 years.  Bone density scan. This is done to screen for osteoporosis. You may have this done starting at age 59.  Mammogram. This may be done every 1-2 years. Talk to your health care provider about how often you should have regular mammograms. Talk with your health care provider about your test results, treatment options, and if necessary, the need for more tests. Vaccines  Your health care provider may recommend certain vaccines, such as:  Influenza vaccine. This is recommended every year.  Tetanus, diphtheria, and acellular pertussis (Tdap, Td) vaccine. You may need a Td booster every 10 years.  Zoster vaccine. You may need this after age 10.  Pneumococcal 13-valent conjugate (PCV13) vaccine. One dose is recommended after age 11.  Pneumococcal polysaccharide (PPSV23) vaccine. One dose is recommended after age 75. Talk to your health care provider about which screenings and vaccines you need and how often you need them. This information is not intended to replace advice given to you by your health care provider. Make sure you discuss any questions you have with your health care provider. Document Released: 06/10/2015 Document Revised: 02/01/2016 Document Reviewed: 03/15/2015 Elsevier Interactive Patient Education  2017 [employer] Prevention in the Home Falls can cause injuries. They can happen to people of all ages. There are many things you can do to make your home safe and to help prevent falls. What can I do on the outside of my home?  Regularly fix the edges of walkways and driveways and fix any cracks.  Remove  anything that might make you trip as you walk through a door, such as a raised step or threshold.  Trim any bushes or trees on the path to your home.  Use bright outdoor lighting.  Clear any walking paths of anything that might make someone trip, such as rocks or tools.  Regularly check to see if handrails are loose or broken. Make sure that both sides of any steps have handrails.  Any raised decks and porches should have guardrails on the edges.  Have any leaves, snow, or ice cleared regularly.  Use sand or salt on walking paths during winter.  Clean up any spills in your garage right away. This includes oil or grease spills. What can I do in the bathroom?  Use night lights.  Install grab bars by the toilet and in the tub and shower. Do not use towel bars as grab bars.  Use non-skid mats or decals in the tub or shower.  If you need to sit down in the shower, use a plastic, non-slip stool.  Keep the floor dry. Clean up any water that spills on the floor as soon as it happens.  Remove soap buildup in the tub or shower regularly.  Attach bath mats securely with double-sided non-slip rug tape.  Do not have throw rugs and other things on the floor that can make you trip. What can I do in the bedroom?  Use night lights.  Make sure that you have a light  by your bed that is easy to reach.  Do not use any sheets or blankets that are too big for your bed. They should not hang down onto the floor.  Have a firm chair that has side arms. You can use this for support while you get dressed.  Do not have throw rugs and other things on the floor that can make you trip. What can I do in the kitchen?  Clean up any spills right away.  Avoid walking on wet floors.  Keep items that you use a lot in easy-to-reach places.  If you need to reach something above you, use a strong step stool that has a grab bar.  Keep electrical cords out of the way.  Do not use floor polish or wax that  makes floors slippery. If you must use wax, use non-skid floor wax.  Do not have throw rugs and other things on the floor that can make you trip. What can I do with my stairs?  Do not leave any items on the stairs.  Make sure that there are handrails on both sides of the stairs and use them. Fix handrails that are broken or loose. Make sure that handrails are as long as the stairways.  Check any carpeting to make sure that it is firmly attached to the stairs. Fix any carpet that is loose or worn.  Avoid having throw rugs at the top or bottom of the stairs. If you do have throw rugs, attach them to the floor with carpet tape.  Make sure that you have a light switch at the top of the stairs and the bottom of the stairs. If you do not have them, ask someone to add them for you. What else can I do to help prevent falls?  Wear shoes that:  Do not have high heels.  Have rubber bottoms.  Are comfortable and fit you well.  Are closed at the toe. Do not wear sandals.  If you use a stepladder:  Make sure that it is fully opened. Do not climb a closed stepladder.  Make sure that both sides of the stepladder are locked into place.  Ask someone to hold it for you, if possible.  Clearly mark and make sure that you can see:  Any grab bars or handrails.  First and last steps.  Where the edge of each step is.  Use tools that help you move around (mobility aids) if they are needed. These include:  Canes.  Walkers.  Scooters.  Crutches.  Turn on the lights when you go into a dark area. Replace any light bulbs as soon as they burn out.  Set up your furniture so you have a clear path. Avoid moving your furniture around.  If any of your floors are uneven, fix them.  If there are any pets around you, be aware of where they are.  Review your medicines with your doctor. Some medicines can make you feel dizzy. This can increase your chance of falling. Ask your doctor what other  things that you can do to help prevent falls. This information is not intended to replace advice given to you by your health care provider. Make sure you discuss any questions you have with your health care provider. Document Released: 03/10/2009 Document Revised: 10/20/2015 Document Reviewed: 06/18/2014 Elsevier Interactive Patient Education  2017 [employer].

## 2020-07-19 NOTE — Progress Notes (Signed)
Subjective:   Emily Wheeler is a 65 y.o. female who presents for Medicare Annual (Subsequent) preventive examination.  Review of Systems    No ROS. Medicare Wellness Visit. Additional risk factors are reflected in social history. Cardiac Risk Factors include: advanced age (>63mn, >>53women);diabetes mellitus;dyslipidemia;hypertension;obesity (BMI >30kg/m2);family history of premature cardiovascular disease     Objective:    Today's Vitals   07/19/20 1401  BP: 128/80  Pulse: 60  Temp: 98.3 F (36.8 C)  SpO2: 96%  Weight: 216 lb (98 kg)  Height: 5' 4"  (1.626 m)  PainSc: 0-No pain   Body mass index is 37.08 kg/m.  Advanced Directives 07/19/2020 03/31/2016 10/10/2015 09/26/2015 10/28/2014 10/08/2013 09/30/2013  Does Patient Have a Medical Advance Directive? No Yes Yes Yes No Patient does not have advance directive;Patient would not like information Patient does not have advance directive;Patient would not like information  Type of Advance Directive - [place] will [place] will HBaringLiving will - - -  Does patient want to make changes to medical advance directive? - No - Patient declined - - - - -  Copy of [place] Chart? - Yes - - - - -  Would patient like information on creating a medical advance directive? No - Patient declined - - - No - patient declined information - -    Current Medications (verified) Outpatient Encounter Medications as of 07/19/2020  Medication Sig   Accu-Chek FastClix Lancets MISC Inject 1 Act into the skin 3 (three) times daily.   allopurinol (ZYLOPRIM) 300 MG tablet Take 1 tablet by mouth once daily   aspirin EC 81 MG tablet Take 1 tablet (81 mg total) by mouth daily.   atorvastatin (LIPITOR) 40 MG tablet Take 1 tablet by mouth once daily   Blood Glucose Monitoring Suppl (ACCU-CHEK GUIDE) w/Device KIT 1 Act by Does not apply route 3 (three) times  daily.   Calcium Carb-Cholecalciferol (CALCIUM 600 + D PO) Take 1 tablet by mouth daily.   carvedilol (COREG) 6.25 MG tablet TAKE 1 TABLET BY MOUTH TWICE DAILY WITH A MEAL   citalopram (CELEXA) 20 MG tablet Take 1 tablet by mouth once daily   dapagliflozin propanediol (FARXIGA) 10 MG TABS tablet Take 1 tablet (10 mg total) by mouth daily before breakfast.   dexlansoprazole (DEXILANT) 60 MG capsule Take 60 mg by mouth daily.   ENTRESTO 97-103 MG Take 1 tablet by mouth twice daily   gabapentin (NEURONTIN) 600 MG tablet TAKE 1 TABLET BY MOUTH THREE TIMES DAILY   glucose blood (ACCU-CHEK GUIDE) test strip Use TID   Lancets Misc. (ACCU-CHEK FASTCLIX LANCET) KIT Inject 1 Act into the skin 3 (three) times daily.   leflunomide (ARAVA) 20 MG tablet Take 1 tablet by mouth once daily   metFORMIN (GLUCOPHAGE) 500 MG tablet TAKE 1 TABLET BY MOUTH TWICE DAILY WITH A MEAL **ANNUAL  APPOINTMENT  DUE  IN  SEPTEMBER  MUST  SEE  PROVIDER  FOR  FUTURE  REFILLS**   mirabegron ER (MYRBETRIQ) 50 MG TB24 tablet Take 1 tablet (50 mg total) by mouth daily.   Omega-3 Fatty Acids (FISH OIL) 1200 MG CAPS Take 1,200 mg by mouth daily.    PROAIR RESPICLICK 1004(90 Base) MCG/ACT AEPB INHALE 2 PUFFS BY MOUTH 4 TIMES DAILY AS NEEDED   spironolactone (ALDACTONE) 25 MG tablet Take 0.5 tablets (12.5 mg total) by mouth daily.   Tart Cherry 1200 MG CAPS  Take 1 capsule by mouth daily.   Turmeric 500 MG CAPS Take 1 capsule by mouth daily.   vitamin C (ASCORBIC ACID) 500 MG tablet Take 500 mg by mouth daily as needed (for cold symptoms).   No facility-administered encounter medications on file as of 07/19/2020.    Allergies (verified) Lisinopril   History: Past Medical History:  Diagnosis Date   Anemia    mild   Anxiety    Arthritis    Asthma    Cardiomyopathy ([place])    CHF (congestive heart failure) (HCC)    Diabetes mellitus without complication (HCC)    Frequency of urination    at night    GERD (gastroesophageal reflux disease)    potassium worsens reflux   Hemorrhoids, external    Hyperlipidemia    Hypertension    Neuromuscular disorder (HCC)    neuropathy   Numbness and tingling of both legs    Rheumatoid arthritis ([place])    Vertigo    hx of   Past Surgical History:  Procedure Laterality Date   ABDOMINAL HYSTERECTOMY     CARDIAC CATHETERIZATION N/A 10/28/2014   Procedure: Left Heart Cath and Coronary Angiography;  Surgeon: Jettie Booze, MD;  Location: [facility];  Service: Cardiovascular;  Laterality: N/A;   CARPAL TUNNEL RELEASE Left    COLONOSCOPY     pt states 11 yr ago in [place] had a colon with 2 polyps- one cecal polyp per pt. one ? location   COLONOSCOPY W/ POLYPECTOMY     CYST REMOVAL NECK     HEMORROIDECTOMY     MAXIMUM ACCESS (MAS)POSTERIOR LUMBAR INTERBODY FUSION (PLIF) 3 LEVEL N/A 10/08/2013   Procedure: FOR MAXIMUM ACCESS (MAS) POSTERIOR LUMBAR INTERBODY FUSION (PLIF) 3 LEVEL;  Surgeon: Erline Levine, MD;  Location: [facility];  Service: Neurosurgery;  Laterality: N/A;  L3-4 L4-5 L5-S1 maximum access posterior lumbar interbody fusion with decompression   TUBAL LIGATION     Family History  Problem Relation Age of Onset   Early death Father    Heart disease Father    Hypertension Sister    Hypertension Brother    Diabetes Brother    Colon polyps Mother    Hashimoto's thyroiditis Sister    Colon cancer Maternal Grandmother        in her 67s   Stomach cancer Maternal Aunt    Non-Hodgkin's lymphoma Sister    Alcohol abuse Neg Hx    COPD Neg Hx    Depression Neg Hx    Drug abuse Neg Hx    Hearing loss Neg Hx    Hyperlipidemia Neg Hx    Kidney disease Neg Hx    Stroke Neg Hx    Esophageal cancer Neg Hx    Rectal cancer Neg Hx    Social History   Socioeconomic History   Marital status: Single    Spouse name: Not on file   Number of children: Not on file   Years of education: Not on file    Highest education level: Not on file  Occupational History   Not on file  Tobacco Use   Smoking status: Former Smoker   Smokeless tobacco: Never Used  Scientific laboratory technician Use: Never used  Substance and Sexual Activity   Alcohol use: Yes    Comment: 3 x a year -rare    Drug use: No    Types: Marijuana   Sexual activity: Not Currently  Other Topics Concern   Not  on file  Social History Narrative   Not on file   Social Determinants of Health   Financial Resource Strain: Low Risk    Difficulty of Paying Living Expenses: Not hard at all  Food Insecurity: No Food Insecurity   Worried About Charity fundraiser in the Last Year: Never true   Arboriculturist in the Last Year: Never true  Transportation Needs: No Transportation Needs   Lack of Transportation (Medical): No   Lack of Transportation (Non-Medical): No  Physical Activity: Sufficiently Active   Days of Exercise per Week: 5 days   Minutes of Exercise per Session: 30 min  Stress: No Stress Concern Present   Feeling of Stress : Not at all  Social Connections: Moderately Integrated   Frequency of Communication with Friends and Family: More than three times a week   Frequency of Social Gatherings with Friends and Family: More than three times a week   Attends Religious Services: More than 4 times per year   Active Member of Clubs or Organizations: No   Attends Music therapist: More than 4 times per year   Marital Status: Widowed    Tobacco Counseling Counseling given: Not Answered   Clinical Intake:  Pre-visit preparation completed: Yes  Pain : No/denies pain Pain Score: 0-No pain     BMI - recorded: 37.08 Nutritional Status: BMI > 30  Obese Nutritional Risks: None Diabetes: Yes CBG done?: No Did pt. bring in CBG monitor from home?: No  How often do you need to have someone help you when you read instructions, pamphlets, or other written materials from your doctor or  pharmacy?: 1 - Never What is the last grade level you completed in school?: GED  Diabetic? yes  Interpreter Needed?: No  Information entered by :: Lisette Abu, LPN   Activities of Daily Living In your present state of health, do you have any difficulty performing the following activities: 07/19/2020 04/18/2020  Hearing? N N  Vision? N N  Difficulty concentrating or making decisions? N N  Walking or climbing stairs? N N  Dressing or bathing? N N  Doing errands, shopping? N N  Preparing Food and eating ? N -  Using the Toilet? N -  In the past six months, have you accidently leaked urine? N -  Do you have problems with loss of bowel control? N -  Managing your Medications? N -  Managing your Finances? N -  Housekeeping or managing your Housekeeping? N -  Some recent data might be hidden    Patient Care Team: [name], MD as PCP - General (Internal Medicine) Dorothy Spark, MD as PCP - Cardiology (Cardiology)  Indicate any recent Medical Services you may have received from other than [facility] providers in the past year (date may be approximate).     Assessment:   This is a routine wellness examination for Emily Wheeler.  Hearing/Vision screen No exam data present  Dietary issues and exercise activities discussed: Current Exercise Habits: Home exercise routine, Type of exercise: walking (stair climbing), Time (Minutes): 30, Frequency (Times/Week): 5, Weekly Exercise (Minutes/Week): 150, Intensity: Moderate, Exercise limited by: None identified  Goals     Patient Stated     To maintain my current health status by continuing to eat healthy, stay physically active and socially active.      Depression Screen PHQ 2/9 Scores 07/19/2020 04/18/2020 02/03/2019 09/19/2017 03/31/2016 01/31/2013  PHQ - 2 Score 0 0 0 0 0 0  PHQ- 9 Score - - 2 0 - -    Fall Risk Fall Risk  07/19/2020 04/18/2020 02/03/2019 03/31/2016  Falls in the past year? 0 0 0 No  Number falls in past yr: 0 - 0  -  Injury with Fall? 0 - 0 -  Risk for fall due to : No Fall Risks - - -  Follow up - - Falls evaluation completed -    FALL RISK PREVENTION PERTAINING TO THE HOME:  Any stairs in or around the home? Yes  If so, are there any without handrails? No  Home free of loose throw rugs in walkways, pet beds, electrical cords, etc? Yes  Adequate lighting in your home to reduce risk of falls? Yes   ASSISTIVE DEVICES UTILIZED TO PREVENT FALLS:  Life alert? No  Use of a cane, walker or w/c? No  Grab bars in the bathroom? No  Shower chair or bench in shower? No  Elevated toilet seat or a handicapped toilet? No   TIMED UP AND GO:  Was the test performed? No .  Length of time to ambulate 10 feet: 0 sec.   Gait steady and fast without use of assistive device  Cognitive Function: Normal cognitive status assessed by direct observation by this Nurse Health Advisor. No abnormalities found.         Immunizations Immunization History  Administered Date(s) Administered   Fluad Quad(high Dose 65+) 04/18/2020   Influenza, Quadrivalent, Recombinant, Inj, Pf 04/03/2018   Influenza,inj,Quad PF,6+ Mos 01/30/2013, 02/08/2014, 03/09/2015, 03/30/2016, 02/19/2017, 01/22/2019   PFIZER(Purple Top)SARS-COV-2 Vaccination 08/08/2019, 09/02/2019, 05/18/2020   Pneumococcal Polysaccharide-23 08/13/2017   Pneumococcal-Unspecified 05/29/2011   Tdap 01/10/2011    TDAP status: Up to date  Flu Vaccine status: Up to date  Pneumococcal vaccine status: Up to date  Covid-19 vaccine status: Completed vaccines  Qualifies for Shingles Vaccine? Yes   Zostavax completed No   Shingrix Completed?: No.    Education has been provided regarding the importance of this vaccine. Patient has been advised to call insurance company to determine out of pocket expense if they have not yet received this vaccine. Advised may also receive vaccine at local pharmacy or Health Dept. Verbalized acceptance and  understanding.  Screening Tests Health Maintenance  Topic Date Due   DEXA SCAN  Never done   PNA vac Low Risk Adult (1 of 2 - PCV13) 03/01/2019   FOOT EXAM  02/03/2020   OPHTHALMOLOGY EXAM  06/29/2020   HEMOGLOBIN A1C  10/16/2020   TETANUS/TDAP  01/09/2021   MAMMOGRAM  05/12/2021   COLONOSCOPY (Pts 45-31yr Insurance coverage will need to be confirmed)  10/10/2025   INFLUENZA VACCINE  Completed   COVID-19 Vaccine  Completed   Hepatitis C Screening  Completed    Health Maintenance  Health Maintenance Due  Topic Date Due   DEXA SCAN  Never done   PNA vac Low Risk Adult (1 of 2 - PCV13) 03/01/2019   FOOT EXAM  02/03/2020   OPHTHALMOLOGY EXAM  06/29/2020    Colorectal cancer screening: Type of screening: Colonoscopy. Completed 10/11/2015. Repeat every 10 years  Mammogram status: Completed 05/13/2019. Repeat every year (scheduled for 08/2020)  Bone density status: never done  Lung Cancer Screening: (Low Dose CT Chest recommended if Age 67-80years, 30 pack-year currently smoking OR have quit w/in 15years.) does qualify.   Lung Cancer Screening Referral: no  Additional Screening:  Hepatitis C Screening: does qualify; Completed yes  Vision Screening: Recommended annual ophthalmology exams for early  detection of glaucoma and other disorders of the eye. Is the patient up to date with their annual eye exam?  Yes  Who is the provider or what is the name of the office in which the patient attends annual eye exams? [facility]  If pt is not established with a provider, would they like to be referred to a provider to establish care? No .   Dental Screening: Recommended annual dental exams for proper oral hygiene  Community Resource Referral / Chronic Care Management: CRR required this visit?  No   CCM required this visit?  No      Plan:     I have personally reviewed and noted the following in the patients chart:    Medical and social  history  Use of alcohol, tobacco or illicit drugs   Current medications and supplements  Functional ability and status  Nutritional status  Physical activity  Advanced directives  List of other physicians  Hospitalizations, surgeries, and ER visits in previous 12 months  Vitals  Screenings to include cognitive, depression, and falls  Referrals and appointments  In addition, I have reviewed and discussed with patient certain preventive protocols, quality metrics, and best practice recommendations. A written personalized care plan for preventive services as well as general preventive health recommendations were provided to patient.     Sheral Flow, LPN   [id number]/[id number]   Nurse Notes:  Medications reviewed with patient; no opioid use noted.

## 2020-07-20 ENCOUNTER — Other Ambulatory Visit: Payer: Self-pay | Admitting: Internal Medicine

## 2020-07-20 DIAGNOSIS — Z1231 Encounter for screening mammogram for malignant neoplasm of breast: Secondary | ICD-10-CM

## 2020-08-01 ENCOUNTER — Other Ambulatory Visit: Payer: Self-pay | Admitting: Internal Medicine

## 2020-08-01 DIAGNOSIS — M069 Rheumatoid arthritis, unspecified: Secondary | ICD-10-CM

## 2020-08-04 NOTE — Progress Notes (Signed)
[place] Phone: [phone] Subjective:   Emily Wheeler, am serving as a scribe for Dr. Hulan Saas. This visit occurred during the SARS-CoV-2 public health emergency.  Safety protocols were in place, including screening questions prior to the visit, additional usage of staff PPE, and extensive cleaning of exam room while observing appropriate contact time as indicated for disinfecting solutions.  I'm seeing this patient by the request  of:  [name], MD  CC: Bilateral hip pain  [id number]  Emily Wheeler is a 65 y.o. female coming in with complaint of B hip pain. Injected both knees in December 2021. L hip injection 04/25/2020. Patient states that pain is over lateral aspect.  Patient states though is having increasing weakness of the legs.  Having difficulty going upstairs.  Patient states that it is quite severe.  Patient feels like she is having more and more pain with even daily activities.  We discussed following up with her rheumatologist but patient never went back.  Patient has not followed up with the orthopedic surgeon for her back either.   Patient had x-rays of the lumbar spine taken that did show the patient does have a pseudoarthrosis at L5-S1 as well as the posterior lumbar interbody fusion from L2-S1 noted.  At that time patient wanted to continue to monitor.   Past Medical History:  Diagnosis Date  . Anemia    mild  . Anxiety   . Arthritis   . Asthma   . Cardiomyopathy ([place])   . CHF (congestive heart failure) ([place])   . Diabetes mellitus without complication ([place])   . Frequency of urination    at night  . GERD (gastroesophageal reflux disease)    potassium worsens reflux  . Hemorrhoids, external   . Hyperlipidemia   . Hypertension   . Neuromuscular disorder (H[facility])    neuropathy  . Numbness and tingling of both legs   . Rheumatoid arthritis ([place])   . Vertigo    hx of    Past Surgical History:  Procedure Laterality Date  . ABDOMINAL HYSTERECTOMY    . CARDIAC CATHETERIZATION N/A 10/28/2014   Procedure: Left Heart Cath and Coronary Angiography;  Surgeon: Jettie Booze, MD;  Location: [facility];  Service: Cardiovascular;  Laterality: N/A;  . CARPAL TUNNEL RELEASE Left   . COLONOSCOPY     pt states 11 yr ago in [place] had a colon with 2 polyps- one cecal polyp per pt. one ? location  . COLONOSCOPY W/ POLYPECTOMY    . CYST REMOVAL NECK    . HEMORROIDECTOMY    . MAXIMUM ACCESS (MAS)POSTERIOR LUMBAR INTERBODY FUSION (PLIF) 3 LEVEL N/A 10/08/2013   Procedure: FOR MAXIMUM ACCESS (MAS) POSTERIOR LUMBAR INTERBODY FUSION (PLIF) 3 LEVEL;  Surgeon: Erline Levine, MD;  Location: [facility];  Service: Neurosurgery;  Laterality: N/A;  L3-4 L4-5 L5-S1 maximum access posterior lumbar interbody fusion with decompression  . TUBAL LIGATION     Social History   Socioeconomic History  . Marital status: Single    Spouse name: Not on file  . Number of children: Not on file  . Years of education: Not on file  . Highest education level: Not on file  Occupational History  . Not on file  Tobacco Use  . Smoking status: Former Research scientist (life sciences)  . Smokeless tobacco: Never Used  Vaping Use  . Vaping Use: Never used  Substance and Sexual Activity  .  Alcohol use: Yes    Comment: 3 x a year -rare   . Drug use: Wheeler    Types: Marijuana  . Sexual activity: Not Currently  Other Topics Concern  . Not on file  Social History Narrative  . Not on file   Social Determinants of Health   Financial Resource Strain: Low Risk   . Difficulty of Paying Living Expenses: Not hard at all  Food Insecurity: Wheeler Food Insecurity  . Worried About Charity fundraiser in the Last Year: Never true  . Ran Out of Food in the Last Year: Never true  Transportation Needs: Wheeler Transportation Needs  . Lack of Transportation (Medical): Wheeler  . Lack of Transportation (Non-Medical): Wheeler  Physical Activity:  Sufficiently Active  . Days of Exercise per Week: 5 days  . Minutes of Exercise per Session: 30 min  Stress: Wheeler Stress Concern Present  . Feeling of Stress : Not at all  Social Connections: Moderately Integrated  . Frequency of Communication with Friends and Family: More than three times a week  . Frequency of Social Gatherings with Friends and Family: More than three times a week  . Attends Religious Services: More than 4 times per year  . Active Member of Clubs or Organizations: Wheeler  . Attends Archivist Meetings: More than 4 times per year  . Marital Status: Widowed   Allergies  Allergen Reactions  . Lisinopril Cough   Family History  Problem Relation Age of Onset  . Early death Father   . Heart disease Father   . Hypertension Sister   . Hypertension Brother   . Diabetes Brother   . Colon polyps Mother   . Hashimoto's thyroiditis Sister   . Colon cancer Maternal Grandmother        in her 77s  . Stomach cancer Maternal Aunt   . Non-Hodgkin's lymphoma Sister   . Alcohol abuse Neg Hx   . COPD Neg Hx   . Depression Neg Hx   . Drug abuse Neg Hx   . Hearing loss Neg Hx   . Hyperlipidemia Neg Hx   . Kidney disease Neg Hx   . Stroke Neg Hx   . Esophageal cancer Neg Hx   . Rectal cancer Neg Hx     Current Outpatient Medications (Endocrine & Metabolic):  .  dapagliflozin propanediol (FARXIGA) 10 MG TABS tablet, Take 1 tablet (10 mg total) by mouth daily before breakfast. .  metFORMIN (GLUCOPHAGE) 500 MG tablet, TAKE 1 TABLET BY MOUTH TWICE DAILY WITH A MEAL **ANNUAL  APPOINTMENT  DUE  IN  SEPTEMBER  MUST  SEE  PROVIDER  FOR  FUTURE  REFILLS**  Current Outpatient Medications (Cardiovascular):  .  atorvastatin (LIPITOR) 40 MG tablet, Take 1 tablet by mouth once daily .  carvedilol (COREG) 6.25 MG tablet, TAKE 1 TABLET BY MOUTH TWICE DAILY WITH A MEAL .  ENTRESTO 97-103 MG, Take 1 tablet by mouth twice daily .  spironolactone (ALDACTONE) 25 MG tablet, Take 0.5 tablets  (12.5 mg total) by mouth daily.  Current Outpatient Medications (Respiratory):  .  PROAIR RESPICLICK 528 (90 Base) MCG/ACT AEPB, INHALE 2 PUFFS BY MOUTH 4 TIMES DAILY AS NEEDED  Current Outpatient Medications (Analgesics):  .  allopurinol (ZYLOPRIM) 300 MG tablet, Take 1 tablet by mouth once daily .  aspirin EC 81 MG tablet, Take 1 tablet (81 mg total) by mouth daily. Marland Kitchen  leflunomide (ARAVA) 20 MG tablet, Take 1 tablet by mouth once daily  Current Outpatient Medications (Other):  Marland Kitchen  Accu-Chek FastClix Lancets MISC, Inject 1 Act into the skin 3 (three) times daily. .  Blood Glucose Monitoring Suppl (ACCU-CHEK GUIDE) w/Device KIT, 1 Act by Does not apply route 3 (three) times daily. .  Calcium Carb-Cholecalciferol (CALCIUM 600 + D PO), Take 1 tablet by mouth daily. .  citalopram (CELEXA) 20 MG tablet, Take 1 tablet by mouth once daily .  dexlansoprazole (DEXILANT) 60 MG capsule, Take 60 mg by mouth daily. Marland Kitchen  gabapentin (NEURONTIN) 600 MG tablet, TAKE 1 TABLET BY MOUTH THREE TIMES DAILY .  glucose blood (ACCU-CHEK GUIDE) test strip, Use TID .  Lancets Misc. (ACCU-CHEK FASTCLIX LANCET) KIT, Inject 1 Act into the skin 3 (three) times daily. .  mirabegron ER (MYRBETRIQ) 50 MG TB24 tablet, Take 1 tablet (50 mg total) by mouth daily. .  Omega-3 Fatty Acids (FISH OIL) 1200 MG CAPS, Take 1,200 mg by mouth daily.  Teena Dunk Cherry 1200 MG CAPS, Take 1 capsule by mouth daily. .  Turmeric 500 MG CAPS, Take 1 capsule by mouth daily. .  vitamin C (ASCORBIC ACID) 500 MG tablet, Take 500 mg by mouth daily as needed (for cold symptoms).   Reviewed prior external information including notes and imaging from  primary care provider As well as notes that were available from care everywhere and other healthcare systems.  Past medical history, social, surgical and family history all reviewed in electronic medical record.  Wheeler pertanent information unless stated regarding to the chief complaint.   Review of  Systems:  Wheeler headache, visual changes, nausea, vomiting, diarrhea, constipation, dizziness, abdominal pain, skin rash, fevers, chills, night sweats, weight loss, swollen lymph nodes, body aches, joint swelling, chest pain, shortness of breath, mood changes. POSITIVE muscle aches  Objective  Last menstrual period 05/29/1991.   General: Wheeler apparent distress alert and oriented x3 mood and affect normal, dressed appropriately.  HEENT: Pupils equal, extraocular movements intact  Respiratory: Patient's speak in full sentences and does not appear short of breath  Cardiovascular: Trace lower extremity edema, non tender, Wheeler erythema mild peripheral neuropathy Gait antalgic gait MSK: Back exam does have some loss of lordosis.  Patient does have tightness with straight leg test.  Mild atrophy noted of the right gluteal area.  Patient does have weakness with hip flexion and hip abduction.  Deep tendon reflexes though do appear to be intact in the Achilles at 1+ but symmetric   Procedure: Real-time Ultrasound Guided Injection of left  greater trochanteric bursitis secondary to patient's body habitus Device: GE Logiq Q7  Ultrasound guided injection is preferred based studies that show increased duration, increased effect, greater accuracy, decreased procedural pain, increased response rate, and decreased cost with ultrasound guided versus blind injection.  Verbal informed consent obtained.  Time-out conducted.  Noted Wheeler overlying erythema, induration, or other signs of local infection.  Skin prepped in a sterile fashion.  Local anesthesia: Topical Ethyl chloride.  With sterile technique and under real time ultrasound guidance:  Greater trochanteric area was visualized and patient's bursa was noted. A 22-gauge 3 inch needle was inserted and 2 cc of 0.5% Marcaine and 1 cc of Kenalog 40 mg/dL was injected. Pictures taken Completed without difficulty  Pain immediately resolved suggesting accurate placement of  the medication.  Advised to call if fevers/chills, erythema, induration, drainage, or persistent bleeding.    Impression: Technically successful ultrasound guided injection.   Procedure: Real-time Ultrasound Guided Injection of right greater trochanteric bursitis secondary to  patient's body habitus Device: GE Logiq Q7 Ultrasound guided injection is preferred based studies that show increased duration, increased effect, greater accuracy, decreased procedural pain, increased response rate, and decreased cost with ultrasound guided versus blind injection.  Verbal informed consent obtained.  Time-out conducted.  Noted Wheeler overlying erythema, induration, or other signs of local infection.  Skin prepped in a sterile fashion.  Local anesthesia: Topical Ethyl chloride.  With sterile technique and under real time ultrasound guidance:  Greater trochanteric area was visualized and patient's bursa was noted. A 22-gauge 3 inch needle was inserted and 2 cc of 0.5% Marcaine and 1 cc of Kenalog 40 mg/dL was injected. Pictures taken Completed without difficulty  Pain immediately resolved suggesting accurate placement of the medication.  Advised to call if fevers/chills, erythema, induration, drainage, or persistent bleeding.   Impression: Technically successful ultrasound guided injection.    Impression and Recommendations:     The above documentation has been reviewed and is accurate and complete Lyndal Pulley, DO

## 2020-08-09 ENCOUNTER — Ambulatory Visit: Payer: Self-pay

## 2020-08-09 ENCOUNTER — Other Ambulatory Visit: Payer: Self-pay

## 2020-08-09 ENCOUNTER — Encounter: Payer: Self-pay | Admitting: Family Medicine

## 2020-08-09 ENCOUNTER — Ambulatory Visit (INDEPENDENT_AMBULATORY_CARE_PROVIDER_SITE_OTHER): Payer: Medicare Other | Admitting: Family Medicine

## 2020-08-09 VITALS — BP 138/84 | HR 73 | Ht 64.0 in | Wt 213.0 lb

## 2020-08-09 DIAGNOSIS — M7062 Trochanteric bursitis, left hip: Secondary | ICD-10-CM | POA: Diagnosis not present

## 2020-08-09 DIAGNOSIS — M545 Low back pain, unspecified: Secondary | ICD-10-CM

## 2020-08-09 DIAGNOSIS — M2550 Pain in unspecified joint: Secondary | ICD-10-CM | POA: Diagnosis not present

## 2020-08-09 DIAGNOSIS — G8929 Other chronic pain: Secondary | ICD-10-CM

## 2020-08-09 DIAGNOSIS — I739 Peripheral vascular disease, unspecified: Secondary | ICD-10-CM | POA: Diagnosis not present

## 2020-08-09 DIAGNOSIS — M7061 Trochanteric bursitis, right hip: Secondary | ICD-10-CM

## 2020-08-09 DIAGNOSIS — M48062 Spinal stenosis, lumbar region with neurogenic claudication: Secondary | ICD-10-CM | POA: Diagnosis not present

## 2020-08-09 DIAGNOSIS — M25552 Pain in left hip: Secondary | ICD-10-CM | POA: Diagnosis not present

## 2020-08-09 DIAGNOSIS — M25551 Pain in right hip: Secondary | ICD-10-CM

## 2020-08-09 LAB — COMPREHENSIVE METABOLIC PANEL
ALT: 13 U/L (ref 0–35)
AST: 15 U/L (ref 0–37)
Albumin: 4.1 g/dL (ref 3.5–5.2)
Alkaline Phosphatase: 69 U/L (ref 39–117)
BUN: 17 mg/dL (ref 6–23)
CO2: 25 mEq/L (ref 19–32)
Calcium: 9.4 mg/dL (ref 8.4–10.5)
Chloride: 107 mEq/L (ref 96–112)
Creatinine, Ser: 1.29 mg/dL — ABNORMAL HIGH (ref 0.40–1.20)
GFR: 43.27 mL/min — ABNORMAL LOW (ref >60.00)
Glucose, Bld: 103 mg/dL — ABNORMAL HIGH (ref 70–99)
Potassium: 4.3 mEq/L (ref 3.5–5.1)
Sodium: 141 mEq/L (ref 135–145)
Total Bilirubin: 0.5 mg/dL (ref 0.2–1.2)
Total Protein: 7 g/dL (ref 6.0–8.3)

## 2020-08-09 LAB — CBC WITH DIFFERENTIAL/PLATELET
Basophils Absolute: 0 10*3/uL (ref 0.0–0.1)
Basophils Relative: 0.5 % (ref 0.0–3.0)
Eosinophils Absolute: 0.2 10*3/uL (ref 0.0–0.7)
Eosinophils Relative: 4.7 % (ref 0.0–5.0)
HCT: 32.7 % — ABNORMAL LOW (ref 36.0–46.0)
Hemoglobin: 10.7 g/dL — ABNORMAL LOW (ref 12.0–15.0)
Lymphocytes Relative: 43.2 % (ref 12.0–46.0)
Lymphs Abs: 1.7 10*3/uL (ref 0.7–4.0)
MCHC: 32.7 g/dL (ref 30.0–36.0)
MCV: 99.1 fl (ref 78.0–100.0)
Monocytes Absolute: 0.4 10*3/uL (ref 0.1–1.0)
Monocytes Relative: 10.6 % (ref 3.0–12.0)
Neutro Abs: 1.6 10*3/uL (ref 1.4–7.7)
Neutrophils Relative %: 41 % — ABNORMAL LOW (ref 43.0–77.0)
Platelets: 263 10*3/uL (ref 150.0–400.0)
RBC: 3.31 Mil/uL — ABNORMAL LOW (ref 3.87–5.11)
RDW: 15.6 % — ABNORMAL HIGH (ref 11.5–15.5)
WBC: 3.9 10*3/uL — ABNORMAL LOW (ref 4.0–10.5)

## 2020-08-09 LAB — SEDIMENTATION RATE: Sed Rate: 41 mm/hr — ABNORMAL HIGH (ref 0–30)

## 2020-08-09 LAB — URIC ACID: Uric Acid, Serum: 2.4 mg/dL (ref 2.4–7.0)

## 2020-08-09 LAB — IBC PANEL
Iron: 97 ug/dL (ref 42–145)
Saturation Ratios: 27.9 % (ref 20.0–50.0)
Transferrin: 248 mg/dL (ref 212.0–360.0)

## 2020-08-09 LAB — VITAMIN D 25 HYDROXY (VIT D DEFICIENCY, FRACTURES): VITD: 50.07 ng/mL (ref 30.00–100.00)

## 2020-08-09 LAB — TSH: TSH: 3.17 u[IU]/mL (ref 0.35–4.50)

## 2020-08-09 LAB — CK: Total CK: 83 U/L (ref 7–177)

## 2020-08-09 NOTE — Assessment & Plan Note (Signed)
Concern with patient's back.  I am concerned that patient may have some loosening of the posterior lumbar interbody fusion.  Patient is having weakness of the lower extremities.  Patient does have what appears to be more of a significant neuropathy of the lower extremities as well.  I do feel to start off with a CT scan of the back would be most beneficial.  If we do see any loosening we will need to refer to orthopedic or neurosurgery.  We did discuss food to go to but patient actually has an outstanding bill and may need to consider another physician for evaluation.  At this point we did discuss other medications including patient's gabapentin.  Patient does not want to make any other large changes.  Given greater trochanteric injections today we will see if that makes improvement.  If continuing to have difficulty nerve conduction study also could be beneficial.  Patient will follow-up after imaging and will discuss further.

## 2020-08-09 NOTE — Patient Instructions (Signed)
Good to see you Injected hips  Labs today [facility] [phone] for  CT Scan We will be talking

## 2020-08-09 NOTE — Assessment & Plan Note (Signed)
History of peripheral artery disease.  Patient has good blood flow it seems like today but may need further evaluation.

## 2020-08-09 NOTE — Assessment & Plan Note (Signed)
Bilateral injections given today, tolerated the procedure well.  I do believe that this is secondary to either more lumbar radiculopathy and will get CT lumbar to further evaluate the back.  Patient has history of rheumatoid arthritis, neuropathy as well as peripheral artery disease.  Laboratory work-up also ordered today to see if anything else is abnormal that could be contributing.  Follow-up with me after imaging.

## 2020-08-11 ENCOUNTER — Encounter: Payer: Self-pay | Admitting: Family Medicine

## 2020-08-11 LAB — PROTEIN ELECTROPHORESIS, SERUM
Albumin ELP: 3.9 g/dL (ref 3.8–4.8)
Alpha 1: 0.3 g/dL (ref 0.2–0.3)
Alpha 2: 0.8 g/dL (ref 0.5–0.9)
Beta 2: 0.3 g/dL (ref 0.2–0.5)
Beta Globulin: 0.4 g/dL (ref 0.4–0.6)
Gamma Globulin: 0.7 g/dL — ABNORMAL LOW (ref 0.8–1.7)
Total Protein: 6.3 g/dL (ref 6.1–8.1)

## 2020-08-11 LAB — PTH, INTACT AND CALCIUM
Calcium: 9.5 mg/dL (ref 8.6–10.4)
PTH: 74 pg/mL (ref 16–77)

## 2020-08-11 LAB — RHEUMATOID FACTOR: Rheumatoid fact SerPl-aCnc: 37 IU/mL — ABNORMAL HIGH (ref <14)

## 2020-08-15 ENCOUNTER — Telehealth: Payer: Self-pay | Admitting: Internal Medicine

## 2020-08-15 NOTE — Progress Notes (Signed)
  Chronic Care Management   Outreach Note  08/15/2020 Name: LEONI GOODNESS MRN: [id number] DOB: 05-25-1956  Referred by: [name], MD Reason for referral : No chief complaint on file.   An unsuccessful telephone outreach was attempted today. The patient was referred to the pharmacist for assistance with care management and care coordination.   Follow Up Plan:   Carley Perdue [employer] Scheduler

## 2020-08-22 ENCOUNTER — Ambulatory Visit
Admission: RE | Admit: 2020-08-22 | Discharge: 2020-08-22 | Disposition: A | Discharge status: Home / Self Care | Payer: Medicare Other | Source: Ambulatory Visit | Attending: Family Medicine | Admitting: Family Medicine

## 2020-08-22 ENCOUNTER — Encounter: Payer: Self-pay | Admitting: Family Medicine

## 2020-08-22 DIAGNOSIS — M545 Low back pain, unspecified: Secondary | ICD-10-CM

## 2020-08-22 DIAGNOSIS — G8929 Other chronic pain: Secondary | ICD-10-CM

## 2020-08-23 ENCOUNTER — Telehealth: Payer: Self-pay | Admitting: Internal Medicine

## 2020-08-23 NOTE — Progress Notes (Signed)
  Chronic Care Management   Note  08/23/2020 Name: Emily Wheeler MRN: [id number] DOB: [birth year]  Emily Wheeler is a 65 y.o. year old female who is a primary care patient of [name], MD. I reached out to [name] by phone today in response to a referral sent by Emily Wheeler PCP, [name], MD.   Emily Wheeler was given information about Chronic Care Management services today including:  1. CCM service includes personalized support from designated clinical staff supervised by her physician, including individualized plan of care and coordination with other care providers 2. 24/7 contact phone numbers for assistance for urgent and routine care needs. 3. Service will only be billed when office clinical staff spend 20 minutes or more in a month to coordinate care. 4. Only one practitioner may furnish and bill the service in a calendar month. 5. The patient may stop CCM services at any time (effective at the end of the month) by phone call to the office staff.   Patient agreed to services and verbal consent obtained.   Follow up plan:   Carley Perdue [employer] Scheduler

## 2020-08-24 ENCOUNTER — Other Ambulatory Visit: Payer: Self-pay

## 2020-08-24 ENCOUNTER — Ambulatory Visit (INDEPENDENT_AMBULATORY_CARE_PROVIDER_SITE_OTHER): Payer: Medicare Other | Admitting: Internal Medicine

## 2020-08-24 ENCOUNTER — Encounter: Payer: Self-pay | Admitting: Internal Medicine

## 2020-08-24 ENCOUNTER — Other Ambulatory Visit: Payer: Medicare Other

## 2020-08-24 VITALS — BP 132/86 | HR 87 | Temp 98.4°F | Ht 64.0 in | Wt 211.0 lb

## 2020-08-24 DIAGNOSIS — E118 Type 2 diabetes mellitus with unspecified complications: Secondary | ICD-10-CM | POA: Diagnosis not present

## 2020-08-24 DIAGNOSIS — N1831 Chronic kidney disease, stage 3a: Secondary | ICD-10-CM

## 2020-08-24 DIAGNOSIS — Z0001 Encounter for general adult medical examination with abnormal findings: Secondary | ICD-10-CM

## 2020-08-24 DIAGNOSIS — Z23 Encounter for immunization: Secondary | ICD-10-CM

## 2020-08-24 DIAGNOSIS — D638 Anemia in other chronic diseases classified elsewhere: Secondary | ICD-10-CM | POA: Diagnosis not present

## 2020-08-24 DIAGNOSIS — I10 Essential (primary) hypertension: Secondary | ICD-10-CM

## 2020-08-24 DIAGNOSIS — D510 Vitamin B12 deficiency anemia due to intrinsic factor deficiency: Secondary | ICD-10-CM

## 2020-08-24 DIAGNOSIS — Z1231 Encounter for screening mammogram for malignant neoplasm of breast: Secondary | ICD-10-CM

## 2020-08-24 LAB — VITAMIN B12: Vitamin B-12: >1506 pg/mL — ABNORMAL HIGH (ref 211–911)

## 2020-08-24 LAB — URINALYSIS, ROUTINE W REFLEX MICROSCOPIC
Bilirubin Urine: NEGATIVE
Hgb urine dipstick: NEGATIVE
Ketones, ur: NEGATIVE
Leukocytes,Ua: NEGATIVE
Nitrite: NEGATIVE
RBC / HPF: NONE SEEN (ref 0–?)
Specific Gravity, Urine: 1.015 (ref 1.000–1.030)
Total Protein, Urine: NEGATIVE
Urine Glucose: >=1000 — AB
Urobilinogen, UA: 0.2 (ref 0.0–1.0)
pH: 6 (ref 5.0–8.0)

## 2020-08-24 LAB — FERRITIN: Ferritin: 22.9 ng/mL (ref 10.0–291.0)

## 2020-08-24 LAB — RETICULOCYTES
ABS Retic: 56160 cells/uL (ref 20000–8000)
Retic Ct Pct: 1.6 %

## 2020-08-24 LAB — MICROALBUMIN / CREATININE URINE RATIO
Creatinine,U: 99.7 mg/dL
Microalb Creat Ratio: 1.5 mg/g (ref 0.0–30.0)
Microalb, Ur: 1.5 mg/dL (ref 0.0–1.9)

## 2020-08-24 LAB — HEMOGLOBIN A1C: Hgb A1c MFr Bld: 6 % (ref 4.6–6.5)

## 2020-08-24 LAB — IRON: Iron: 92 ug/dL (ref 42–145)

## 2020-08-24 LAB — FOLATE: Folate: >23.6 ng/mL (ref >5.9)

## 2020-08-24 MED ORDER — BOOSTRIX 5-2.5-18.5 LF-MCG/0.5 IM SUSP: 0.5000 mL | Freq: Once | INTRAMUSCULAR | 0 refills | Status: AC

## 2020-08-24 NOTE — Progress Notes (Signed)
Subjective:  Patient ID: Emily Wheeler, female    DOB: 1956-05-13  Age: 65 y.o. MRN: [id number]  CC: Annual Exam, Anemia, Hypertension, Hyperlipidemia, and Diabetes  This visit occurred during the SARS-CoV-2 public health emergency.  Safety protocols were in place, including screening questions prior to the visit, additional usage of staff PPE, and extensive cleaning of exam room while observing appropriate contact time as indicated for disinfecting solutions.    HPI Jamin I Rison presents for a CPX.  She complains of chronic hip and low back pain.  She is active and denies any recent episodes of chest pain, shortness of breath, palpitations, edema, or fatigue.  Outpatient Medications Prior to Visit  Medication Sig Dispense Refill  . Accu-Chek FastClix Lancets MISC Inject 1 Act into the skin 3 (three) times daily. 306 each 1  . allopurinol (ZYLOPRIM) 300 MG tablet Take 1 tablet by mouth once daily 90 tablet 0  . aspirin EC 81 MG tablet Take 1 tablet (81 mg total) by mouth daily. 90 tablet 3  . atorvastatin (LIPITOR) 40 MG tablet Take 1 tablet by mouth once daily 90 tablet 1  . Blood Glucose Monitoring Suppl (ACCU-CHEK GUIDE) w/Device KIT 1 Act by Does not apply route 3 (three) times daily. 2 kit 0  . Calcium Carb-Cholecalciferol (CALCIUM 600 + D PO) Take 1 tablet by mouth daily.    . carvedilol (COREG) 6.25 MG tablet TAKE 1 TABLET BY MOUTH TWICE DAILY WITH A MEAL 180 tablet 0  . citalopram (CELEXA) 20 MG tablet Take 1 tablet by mouth once daily 90 tablet 1  . dapagliflozin propanediol (FARXIGA) 10 MG TABS tablet Take 1 tablet (10 mg total) by mouth daily before breakfast. 30 tablet 12  . dexlansoprazole (DEXILANT) 60 MG capsule Take 60 mg by mouth daily.    Marland Kitchen ENTRESTO 97-103 MG Take 1 tablet by mouth twice daily 180 tablet 3  . gabapentin (NEURONTIN) 600 MG tablet TAKE 1 TABLET BY MOUTH THREE TIMES DAILY 270 tablet 0  . glucose blood (ACCU-CHEK GUIDE) test strip Use TID 100 each 12   . Lancets Misc. (ACCU-CHEK FASTCLIX LANCET) KIT Inject 1 Act into the skin 3 (three) times daily. 306 kit 1  . leflunomide (ARAVA) 20 MG tablet Take 1 tablet by mouth once daily 90 tablet 0  . mirabegron ER (MYRBETRIQ) 50 MG TB24 tablet Take 1 tablet (50 mg total) by mouth daily. 90 tablet 1  . Omega-3 Fatty Acids (FISH OIL) 1200 MG CAPS Take 1,200 mg by mouth daily.     Marland Kitchen PROAIR RESPICLICK 080 (90 Base) MCG/ACT AEPB INHALE 2 PUFFS BY MOUTH 4 TIMES DAILY AS NEEDED 1 each 3  . spironolactone (ALDACTONE) 25 MG tablet Take 0.5 tablets (12.5 mg total) by mouth daily. 90 tablet 3  . Tart Cherry 1200 MG CAPS Take 1 capsule by mouth daily.    . Turmeric 500 MG CAPS Take 1 capsule by mouth daily.    . metFORMIN (GLUCOPHAGE) 500 MG tablet TAKE 1 TABLET BY MOUTH TWICE DAILY WITH A MEAL **ANNUAL  APPOINTMENT  DUE  IN  SEPTEMBER  MUST  SEE  PROVIDER  FOR  FUTURE  REFILLS** 180 tablet 0  . vitamin C (ASCORBIC ACID) 500 MG tablet Take 500 mg by mouth daily as needed (for cold symptoms).     No facility-administered medications prior to visit.    ROS Review of Systems  Constitutional: Negative for chills, diaphoresis, fatigue and fever.  HENT: Negative.   Eyes:  Negative for visual disturbance.  Respiratory: Negative for cough, chest tightness, shortness of breath and wheezing.   Cardiovascular: Negative for chest pain, palpitations and leg swelling.  Gastrointestinal: Negative for abdominal pain, blood in stool, constipation, diarrhea, nausea and vomiting.  Endocrine: Negative.   Genitourinary: Negative.  Negative for difficulty urinating.  Musculoskeletal: Positive for arthralgias and back pain. Negative for myalgias.  Skin: Negative.  Negative for color change and pallor.  Neurological: Negative.  Negative for dizziness, weakness, light-headedness and headaches.  Hematological: Negative for adenopathy. Does not bruise/bleed easily.  Psychiatric/Behavioral: Negative.     Objective:  BP 132/86    Pulse 87   Temp 98.4 F (36.9 C) (Oral)   Ht 5' 4"  (1.626 m)   Wt 211 lb (95.7 kg)   LMP 05/29/1991   SpO2 96%   BMI 36.22 kg/m   BP Readings from Last 3 Encounters:  08/24/20 132/86  08/09/20 138/84  07/19/20 128/80    Wt Readings from Last 3 Encounters:  08/24/20 211 lb (95.7 kg)  08/09/20 213 lb (96.6 kg)  07/19/20 216 lb (98 kg)    Physical Exam Vitals reviewed.  HENT:     Nose: Nose normal.     Mouth/Throat:     Mouth: Mucous membranes are moist.  Eyes:     General: No scleral icterus.    Conjunctiva/sclera: Conjunctivae normal.  Cardiovascular:     Rate and Rhythm: Normal rate and regular rhythm.     Pulses: Normal pulses.     Heart sounds: No murmur heard.   Pulmonary:     Effort: Pulmonary effort is normal.     Breath sounds: No wheezing, rhonchi or rales.  Abdominal:     General: Abdomen is protuberant.     Palpations: There is no hepatomegaly, splenomegaly or mass.     Tenderness: There is no abdominal tenderness.  Musculoskeletal:        General: Normal range of motion.     Cervical back: Neck supple.     Right lower leg: No edema.     Left lower leg: No edema.  Lymphadenopathy:     Cervical: No cervical adenopathy.  Skin:    General: Skin is warm and dry.  Neurological:     Mental Status: Mental status is at baseline.  Psychiatric:        Mood and Affect: Mood normal.        Behavior: Behavior normal.     Lab Results  Component Value Date   WBC 3.9 (L) 08/09/2020   HGB 10.7 (L) 08/09/2020   HCT 32.7 (L) 08/09/2020   PLT 263.0 08/09/2020   GLUCOSE 103 (H) 08/09/2020   CHOL 143 11/09/2019   TRIG 120.0 11/09/2019   HDL 49.00 11/09/2019   LDLDIRECT 162.5 01/30/2013   LDLCALC 70 11/09/2019   ALT 13 08/09/2020   AST 15 08/09/2020   NA 141 08/09/2020   K 4.3 08/09/2020   CL 107 08/09/2020   CREATININE 1.29 (H) 08/09/2020   BUN 17 08/09/2020   CO2 25 08/09/2020   TSH 3.17 08/09/2020   INR 0.9 08/19/2018   HGBA1C 6.0 08/24/2020    MICROALBUR 1.5 08/24/2020    CT LUMBAR SPINE WO CONTRAST  Result Date: 08/22/2020 CLINICAL DATA:  Low back pain for greater than 6 weeks. Bilateral hip pain of the last 7 months. Prior surgery. EXAM: CT LUMBAR SPINE WITHOUT CONTRAST TECHNIQUE: Multidetector CT imaging of the lumbar spine was performed without intravenous contrast administration. Multiplanar CT image  reconstructions were also generated. COMPARISON:  Radiographs from 04/25/2020 FINDINGS: Segmentation: The lowest lumbar type non-rib-bearing vertebra is labeled as L5. Alignment: 5 mm fused anterolisthesis at L3-4 and L4-5. 4 mm fused degenerative retrolisthesis at L5-S1. 3 mm of fused retrolisthesis at L2-3. Vertebrae: Posterolateral rod and pedicle screw fixation at L2-L4-L5-S1, with screw tracks from prior L3 pedicle screws in place with the L2 posterolateral rod and pedicle screw apparatus attached to the lower lumbar rod and screw apparatuses. Minimal lucency around the S1 pedicle screws but not to the extent to be suspicious for loosening or infection at this time. Interbody spacer devices at the fused levels. Degenerative endplate sclerosis and loss of disc height at the L1-2 level. Posterior interbody ridging at T12-L1. Posterior decompression from L3-4 through S1. Nitrogen gas phenomenon in both sacroiliac joints. Paraspinal and other soft tissues: Aortoiliac atherosclerotic vascular disease. Disc levels: L1-2: Moderate bilateral foraminal stenosis and suspected moderate central narrowing of the thecal sac due to diffuse disc bulge and facet arthropathy. L2-3: Moderate right and mild left foraminal stenosis primarily from spurring L3-4: Borderline bilateral foraminal stenosis primarily from subluxation. L4-5: No impingement.  Fused level. L5-S1: Moderate left and mild right foraminal stenosis due to intervertebral and facet spurring. IMPRESSION: 1. Lumbar spondylosis and degenerative disc disease causing moderate impingement at L1-2, L2-3,  and L5-S1. 2. Multilevel lumbar fusion, with chronic grade 1 fused anterolisthesis at several levels as detailed above. 3.  Aortic Atherosclerosis (ICD10-I70.0). Electronically Signed   By: Van Clines M.D.   On: 08/22/2020 09:43    Assessment & Plan:   Ellin[place] was seen today for annual exam, anemia, hypertension, hyperlipidemia and diabetes.  Diagnoses and all orders for this visit:  Anemia of chronic disease- Her H&H are stable and screening for secondary causes of anemia is unremarkable.  This is likely the anemia of chronic disease related to renal insufficiency. -     Vitamin B12; Future -     Iron; Future -     Reticulocytes; Future -     Folate; Future -     Ferritin; Future -     Haptoglobin; Future -     Lactate dehydrogenase; Future -     Lactate dehydrogenase -     Haptoglobin -     Ferritin -     Folate -     Reticulocytes -     Iron -     Vitamin B12  Essential hypertension, benign- Her blood pressure is adequately well controlled. -     Urinalysis, Routine w reflex microscopic; Future -     Ambulatory referral to Ophthalmology -     Urinalysis, Routine w reflex microscopic  Type II diabetes mellitus with manifestations ([place])- Her A1c is down to 6.0%.  Will discontinue Metformin. -     Urinalysis, Routine w reflex microscopic; Future -     Microalbumin / creatinine urine ratio; Future -     Hemoglobin A1c; Future -     HM Diabetes Foot Exam -     Hemoglobin A1c -     Microalbumin / creatinine urine ratio -     Urinalysis, Routine w reflex microscopic  Stage 3a chronic kidney disease ([place])- Her renal function has declined some.  I recommended that she see nephrology. -     Urinalysis, Routine w reflex microscopic; Future -     Urinalysis, Routine w reflex microscopic  Vitamin B12 deficiency anemia due to intrinsic factor deficiency- Her B12 and folate levels are  normal now. -     Vitamin B12; Future -     Folate; Future -     Folate -     Vitamin  B12  Visit for screening mammogram -     MM DIGITAL SCREENING BILATERAL; Future  Need for prophylactic vaccination with combined diphtheria-tetanus-pertussis (DTP) vaccine -     Tdap (BOOSTRIX) 5-2.5-18.5 LF-MCG/0.5 injection; Inject 0.5 mLs into the muscle once for 1 dose.  Encounter for general adult medical examination with abnormal findings- Exam completed, labs reviewed, vaccines reviewed and updated, cancer screenings addressed, patient education was given.   I have discontinued Takya I. Wordell's vitamin C and metFORMIN. I am also having her start on Boostrix. Additionally, I am having her maintain her Fish Oil, aspirin EC, Calcium Carb-Cholecalciferol (CALCIUM 600 + D PO), Accu-Chek Guide, Accu-Chek FastClix Lancets, Accu-Chek [employer], Accu-Chek Guide, mirabegron ER, Entresto, dexlansoprazole, Turmeric, Tart Cherry, dapagliflozin propanediol, atorvastatin, citalopram, ProAir RespiClick, gabapentin, allopurinol, spironolactone, carvedilol, and leflunomide.  Meds ordered this encounter  Medications  . Tdap (BOOSTRIX) 5-2.5-18.5 LF-MCG/0.5 injection    Sig: Inject 0.5 mLs into the muscle once for 1 dose.    Dispense:  0.5 mL    Refill:  0     Follow-up: Return in about 6 months (around 02/24/2021).  Scarlette Calico, MD

## 2020-08-24 NOTE — Patient Instructions (Signed)
Health Maintenance, Female Adopting a healthy lifestyle and getting preventive care are important in promoting health and wellness. Ask your health care provider about:  The right schedule for you to have regular tests and exams.  Things you can do on your own to prevent diseases and keep yourself healthy. What should I know about diet, weight, and exercise? Eat a healthy diet  Eat a diet that includes plenty of vegetables, fruits, low-fat dairy products, and lean protein.  Do not eat a lot of foods that are high in solid fats, added sugars, or sodium.   Maintain a healthy weight Body mass index (BMI) is used to identify weight problems. It estimates body fat based on height and weight. Your health care provider can help determine your BMI and help you achieve or maintain a healthy weight. Get regular exercise Get regular exercise. This is one of the most important things you can do for your health. Most adults should:  Exercise for at least 150 minutes each week. The exercise should increase your heart rate and make you sweat (moderate-intensity exercise).  Do strengthening exercises at least twice a week. This is in addition to the moderate-intensity exercise.  Spend less time sitting. Even light physical activity can be beneficial. Watch cholesterol and blood lipids Have your blood tested for lipids and cholesterol at 67 years of age, then have this test every 5 years. Have your cholesterol levels checked more often if:  Your lipid or cholesterol levels are high.  You are older than 67 years of age.  You are at high risk for heart disease. What should I know about cancer screening? Depending on your health history and family history, you may need to have cancer screening at various ages. This may include screening for:  Breast cancer.  Cervical cancer.  Colorectal cancer.  Skin cancer.  Lung cancer. What should I know about heart disease, diabetes, and high blood  pressure? Blood pressure and heart disease  High blood pressure causes heart disease and increases the risk of stroke. This is more likely to develop in people who have high blood pressure readings, are of African descent, or are overweight.  Have your blood pressure checked: ? Every 3-5 years if you are 18-39 years of age. ? Every year if you are 40 years old or older. Diabetes Have regular diabetes screenings. This checks your fasting blood sugar level. Have the screening done:  Once every three years after age 40 if you are at a normal weight and have a low risk for diabetes.  More often and at a younger age if you are overweight or have a high risk for diabetes. What should I know about preventing infection? Hepatitis B If you have a higher risk for hepatitis B, you should be screened for this virus. Talk with your health care provider to find out if you are at risk for hepatitis B infection. Hepatitis C Testing is recommended for:  Everyone born from 1945 through 1965.  Anyone with known risk factors for hepatitis C. Sexually transmitted infections (STIs)  Get screened for STIs, including gonorrhea and chlamydia, if: ? You are sexually active and are younger than 67 years of age. ? You are older than 67 years of age and your health care provider tells you that you are at risk for this type of infection. ? Your sexual activity has changed since you were last screened, and you are at increased risk for chlamydia or gonorrhea. Ask your health care provider   if you are at risk.  Ask your health care provider about whether you are at high risk for HIV. Your health care provider may recommend a prescription medicine to help prevent HIV infection. If you choose to take medicine to prevent HIV, you should first get tested for HIV. You should then be tested every 3 months for as long as you are taking the medicine. Pregnancy  If you are about to stop having your period (premenopausal) and  you may become pregnant, seek counseling before you get pregnant.  Take 400 to 800 micrograms (mcg) of folic acid every day if you become pregnant.  Ask for birth control (contraception) if you want to prevent pregnancy. Osteoporosis and menopause Osteoporosis is a disease in which the bones lose minerals and strength with aging. This can result in bone fractures. If you are 65 years old or older, or if you are at risk for osteoporosis and fractures, ask your health care provider if you should:  Be screened for bone loss.  Take a calcium or vitamin D supplement to lower your risk of fractures.  Be given hormone replacement therapy (HRT) to treat symptoms of menopause. Follow these instructions at home: Lifestyle  Do not use any products that contain nicotine or tobacco, such as cigarettes, e-cigarettes, and chewing tobacco. If you need help quitting, ask your health care provider.  Do not use street drugs.  Do not share needles.  Ask your health care provider for help if you need support or information about quitting drugs. Alcohol use  Do not drink alcohol if: ? Your health care provider tells you not to drink. ? You are pregnant, may be pregnant, or are planning to become pregnant.  If you drink alcohol: ? Limit how much you use to 0-1 drink a day. ? Limit intake if you are breastfeeding.  Be aware of how much alcohol is in your drink. In the [place] one drink equals one 12 oz bottle of beer (355 mL), one 5 oz glass of wine (148 mL), or one 1 oz glass of hard liquor (44 mL). General instructions  Schedule regular health, dental, and eye exams.  Stay current with your vaccines.  Tell your health care provider if: ? You often feel depressed. ? You have ever been abused or do not feel safe at home. Summary  Adopting a healthy lifestyle and getting preventive care are important in promoting health and wellness.  Follow your health care provider's instructions about healthy  diet, exercising, and getting tested or screened for diseases.  Follow your health care provider's instructions on monitoring your cholesterol and blood pressure. This information is not intended to replace advice given to you by your health care provider. Make sure you discuss any questions you have with your health care provider. Document Revised: 05/07/2018 Document Reviewed: 05/07/2018 Elsevier Patient Education  2021 Elsevier Inc.  

## 2020-08-25 LAB — HAPTOGLOBIN: Haptoglobin: 158 mg/dL (ref 43–212)

## 2020-08-25 LAB — LACTATE DEHYDROGENASE: LDH: 169 U/L (ref 120–250)

## 2020-08-26 ENCOUNTER — Encounter: Payer: Self-pay | Admitting: Internal Medicine

## 2020-08-29 ENCOUNTER — Encounter: Payer: Self-pay | Admitting: Internal Medicine

## 2020-08-29 DIAGNOSIS — Z23 Encounter for immunization: Secondary | ICD-10-CM | POA: Insufficient documentation

## 2020-08-29 DIAGNOSIS — Z0001 Encounter for general adult medical examination with abnormal findings: Secondary | ICD-10-CM | POA: Insufficient documentation

## 2020-09-08 ENCOUNTER — Ambulatory Visit
Admission: RE | Admit: 2020-09-08 | Discharge: 2020-09-08 | Disposition: A | Discharge status: Home / Self Care | Payer: Medicare Other | Source: Ambulatory Visit | Attending: Internal Medicine | Admitting: Internal Medicine

## 2020-09-08 ENCOUNTER — Other Ambulatory Visit: Payer: Self-pay

## 2020-09-08 DIAGNOSIS — Z1231 Encounter for screening mammogram for malignant neoplasm of breast: Secondary | ICD-10-CM | POA: Diagnosis not present

## 2020-09-13 ENCOUNTER — Other Ambulatory Visit: Payer: Self-pay

## 2020-09-13 DIAGNOSIS — M069 Rheumatoid arthritis, unspecified: Secondary | ICD-10-CM

## 2020-09-14 ENCOUNTER — Other Ambulatory Visit: Payer: Self-pay

## 2020-09-20 ENCOUNTER — Other Ambulatory Visit: Payer: Self-pay | Admitting: Family Medicine

## 2020-09-20 ENCOUNTER — Other Ambulatory Visit: Payer: Self-pay | Admitting: Internal Medicine

## 2020-09-20 DIAGNOSIS — I10 Essential (primary) hypertension: Secondary | ICD-10-CM

## 2020-09-29 ENCOUNTER — Telehealth: Payer: Self-pay | Admitting: Pharmacist

## 2020-09-29 NOTE — Progress Notes (Signed)
Chronic Care Management Pharmacy Assistant   Name: Emily Wheeler  MRN: [id number] DOB: 1955-07-16   Reason for Encounter: Initial Questions Appt.: Telephone 10/03/20 @ 2 pm    Recent office visits:  08/24/20 Ronnald Ramp (PCP) - Annual. Tdap given. Stop Ascorbic Acid & Metformin. Referrals for Ophthalmology & Nephrology. Diabetes Foot exam.  04/18/20 Ronnald Ramp (PCP) - Anemia.Hypertension.Diabetes. flu vaccine given. f/u 6 months.  Recent consult visits:  08/10/19 Tamala Julian (Sports Medicine) - Bilateral hip pain. f/u 2 months  05/06/20 Kathlen Mody (Cardiology) - f/u heart failure. Start Farxiga 10 mg.   04/25/20 Smith (Sports Medicine) - Chronic pain both knees. Pain injections given every 12 weeks. F/u 6-8 weeks.  Hospital visits:  None in previous 6 months  Medications: Outpatient Encounter Medications as of 09/29/2020  Medication Sig  . Accu-Chek FastClix Lancets MISC Inject 1 Act into the skin 3 (three) times daily.  Marland Kitchen allopurinol (ZYLOPRIM) 300 MG tablet Take 1 tablet by mouth once daily  . aspirin EC 81 MG tablet Take 1 tablet (81 mg total) by mouth daily.  Marland Kitchen atorvastatin (LIPITOR) 40 MG tablet Take 1 tablet by mouth once daily  . Blood Glucose Monitoring Suppl (ACCU-CHEK GUIDE) w/Device KIT 1 Act by Does not apply route 3 (three) times daily.  . Calcium Carb-Cholecalciferol (CALCIUM 600 + D PO) Take 1 tablet by mouth daily.  . carvedilol (COREG) 6.25 MG tablet TAKE 1 TABLET BY MOUTH TWICE DAILY WITH A MEAL  . citalopram (CELEXA) 20 MG tablet Take 1 tablet by mouth once daily  . dapagliflozin propanediol (FARXIGA) 10 MG TABS tablet Take 1 tablet (10 mg total) by mouth daily before breakfast.  . dexlansoprazole (DEXILANT) 60 MG capsule Take 60 mg by mouth daily.  Marland Kitchen ENTRESTO 97-103 MG Take 1 tablet by mouth twice daily  . gabapentin (NEURONTIN) 600 MG tablet TAKE 1 TABLET BY MOUTH THREE TIMES DAILY  . glucose blood (ACCU-CHEK GUIDE) test strip Use TID  . Lancets Misc. (ACCU-CHEK FASTCLIX  LANCET) KIT Inject 1 Act into the skin 3 (three) times daily.  Marland Kitchen leflunomide (ARAVA) 20 MG tablet Take 1 tablet by mouth once daily  . mirabegron ER (MYRBETRIQ) 50 MG TB24 tablet Take 1 tablet (50 mg total) by mouth daily.  . Omega-3 Fatty Acids (FISH OIL) 1200 MG CAPS Take 1,200 mg by mouth daily.   Marland Kitchen PROAIR RESPICLICK 270 (90 Base) MCG/ACT AEPB INHALE 2 PUFFS BY MOUTH 4 TIMES DAILY AS NEEDED  . spironolactone (ALDACTONE) 25 MG tablet Take 0.5 tablets (12.5 mg total) by mouth daily.  . Tart Cherry 1200 MG CAPS Take 1 capsule by mouth daily.  . Turmeric 500 MG CAPS Take 1 capsule by mouth daily.   No facility-administered encounter medications on file as of 09/29/2020.    Have you seen any other providers since your last visit? Patient states she has a new patient appointment at the end of May 2022 with nephrology.  Any changes in your medications or health?  Patient states Dr. Ronnald Ramp stopped her Metformin 500 mg in March 2022.   Any side effects from any medications?  Patient states she has no side effects.  Do you have an symptoms or problems not managed by your medications?  Patient states no symptoms at this time.  Any concerns about your health right now? Patient states her glucose has been running 145-150 and she wants to know if she should start her Metformin back.  Has your provider asked that you check blood pressure, blood  sugar, or follow special diet at home?  Patient states she checks her glucose two time a day: 5/5 - 145 am 5/4 - 148 am 165 pm   Do you get any type of exercise on a regular basis? Patient states no she was told to not over due it due to her hips.  Can you think of a goal you would like to reach for your health?  Patient states she just want to stay healthy so she can attend church.  Do you have any problems getting your medications?  Patient states she has no problem getting medicines.  Is there anything that you would like to discuss during the  appointment?  Patient states she would like to know about taking Metformin again.  Please bring medications and supplements to appointment  Star Rating Drugs: Atorvastatin - last fill 08/27/20 [place], RMA Clinical Pharmacists Assistant [phone]  Time Spent: [phone]

## 2020-10-03 ENCOUNTER — Other Ambulatory Visit: Payer: Self-pay

## 2020-10-03 ENCOUNTER — Ambulatory Visit (INDEPENDENT_AMBULATORY_CARE_PROVIDER_SITE_OTHER): Payer: Medicare Other | Admitting: Pharmacist

## 2020-10-03 DIAGNOSIS — E785 Hyperlipidemia, unspecified: Secondary | ICD-10-CM | POA: Diagnosis not present

## 2020-10-03 DIAGNOSIS — M069 Rheumatoid arthritis, unspecified: Secondary | ICD-10-CM

## 2020-10-03 DIAGNOSIS — I10 Essential (primary) hypertension: Secondary | ICD-10-CM | POA: Diagnosis not present

## 2020-10-03 DIAGNOSIS — N1831 Chronic kidney disease, stage 3a: Secondary | ICD-10-CM | POA: Diagnosis not present

## 2020-10-03 DIAGNOSIS — I255 Ischemic cardiomyopathy: Secondary | ICD-10-CM | POA: Diagnosis not present

## 2020-10-03 DIAGNOSIS — E118 Type 2 diabetes mellitus with unspecified complications: Secondary | ICD-10-CM | POA: Diagnosis not present

## 2020-10-03 DIAGNOSIS — K219 Gastro-esophageal reflux disease without esophagitis: Secondary | ICD-10-CM

## 2020-10-03 NOTE — Patient Instructions (Signed)
Visit Information  Phone number for Pharmacist: [phone]  Goals Addressed            This Visit's Progress   . Manage My Medicine       Timeframe:  Long-Range Goal Priority:  Medium Start Date:           10/03/20                  Expected End Date:    04/26/21                   Follow Up Date 01/24/21   - call for medicine refill 2 or 3 days before it runs out - call if I am sick and can't take my medicine - keep a list of all the medicines I take; vitamins and herbals too - use a pillbox to sort medicine    Why is this important?   . These steps will help you keep on track with your medicines.   Notes:       Patient verbalizes understanding of instructions provided today and agrees to view in MyChart.  Telephone follow up appointment with pharmacy team member scheduled for: 3 months  Al Corpus, PharmD, [place], CPP Clinical Pharmacist [facility] [phone]

## 2020-10-03 NOTE — Progress Notes (Signed)
Chronic Care Management Pharmacy Note  10/03/2020 Name:  JANEYA DEYO MRN:  [id number] DOB:  1956/05/15  Subjective: Jamaiyah I Hardiman is an 65 y.o. year old female who is a primary patient of [name], MD.  The CCM team was consulted for assistance with disease management and care coordination needs.    Engaged with patient by telephone for initial visit in response to provider referral for pharmacy case management and/or care coordination services.   Consent to Services:  The patient was given the following information about Chronic Care Management services today, agreed to services, and gave verbal consent: 1. CCM service includes personalized support from designated clinical staff supervised by the primary care provider, including individualized plan of care and coordination with other care providers 2. 24/7 contact phone numbers for assistance for urgent and routine care needs. 3. Service will only be billed when office clinical staff spend 20 minutes or more in a month to coordinate care. 4. Only one practitioner may furnish and bill the service in a calendar month. 5.The patient may stop CCM services at any time (effective at the end of the month) by phone call to the office staff. 6. The patient will be responsible for cost sharing (co-pay) of up to 20% of the service fee (after annual deductible is met). Patient agreed to services and consent obtained.  Patient Care Team: [name], MD as PCP - General (Internal Medicine) Dorothy Spark, MD (Inactive) as PCP - Cardiology (Cardiology) Opthamology, [facility] as Consulting Physician (Ophthalmology) Charlton Haws, [facility] as Pharmacist (Pharmacist)  Recent office visits: 08/24/20 Ronnald Ramp (PCP) - Annual. Tdap given. Stop Ascorbic Acid & Metformin. Referrals for Ophthalmology & Nephrology.  04/18/20 Ronnald Ramp (PCP) - Anemia.Hypertension.Diabetes. flu vaccine given. f/u 6 months.  Recent consult visits: 08/10/19 Tamala Julian  (Sports Medicine) - Bilateral hip pain. f/u 2 months. Referred to rheumatology  05/06/20 [facility] (Cardiology) - f/u heart failure. Start Farxiga 10 mg.   04/25/20 Smith (Sports Medicine) - Chronic pain both knees. Pain injections given every 12 weeks. F/u 6-8 weeks.  Hospital visits: None in previous 6 months  Objective:  Lab Results  Component Value Date   CREATININE 1.29 (H) 08/09/2020   BUN 17 08/09/2020   GFR 43.27 (L) 08/09/2020   GFRNONAA 44 (L) 06/21/2020   GFRAA 51 (L) 06/21/2020   NA 141 08/09/2020   K 4.3 08/09/2020   CALCIUM 9.5 08/09/2020   CALCIUM 9.4 08/09/2020   CO2 25 08/09/2020   GLUCOSE 103 (H) 08/09/2020    Lab Results  Component Value Date/Time   HGBA1C 6.0 08/24/2020 10:32 AM   HGBA1C 6.1 04/18/2020 03:39 PM   GFR 43.27 (L) 08/09/2020 08:06 AM   GFR 55.51 (L) 04/18/2020 03:39 PM   MICROALBUR 1.5 08/24/2020 10:32 AM   MICROALBUR 1.6 11/09/2019 11:56 AM    Last diabetic Eye exam:  Lab Results  Component Value Date/Time   HMDIABEYEEXA No Retinopathy 06/30/2019 12:00 AM    Last diabetic Foot exam:  Lab Results  Component Value Date/Time   HMDIABFOOTEX done 11/26/2017 12:00 AM     Lab Results  Component Value Date   CHOL 143 11/09/2019   HDL 49.00 11/09/2019   LDLCALC 70 11/09/2019   LDLDIRECT 162.5 01/30/2013   TRIG 120.0 11/09/2019   CHOLHDL 3 11/09/2019    Hepatic Function Latest Ref Rng & Units 08/09/2020 08/09/2020 11/09/2019  Total Protein 6.1 - 8.1 g/dL 6.3 7.0 6.7  Albumin 3.5 - 5.2 g/dL - 4.1 4.3  AST 0 - 37 U/L - 15 15  ALT 0 - 35 U/L - 13 12  Alk Phosphatase 39 - 117 U/L - 69 68  Total Bilirubin 0.2 - 1.2 mg/dL - 0.5 0.4  Bilirubin, Direct 0.0 - 0.3 mg/dL - - 0.0    Lab Results  Component Value Date/Time   TSH 3.17 08/09/2020 08:06 AM   TSH 0.97 11/09/2019 11:56 AM    CBC Latest Ref Rng & Units 08/09/2020 04/18/2020 11/09/2019  WBC 4.0 - 10.5 K/uL 3.9(L) 3.7(L) 4.0  Hemoglobin 12.0 - 15.0 g/dL 10.7(L) 10.2(L) 9.9(L)   Hematocrit 36.0 - 46.0 % 32.7(L) 31.2(L) 30.0(L)  Platelets 150.0 - 400.0 K/uL 263.0 234.0 220.0    Lab Results  Component Value Date/Time   VD25OH 50.07 08/09/2020 08:06 AM    Clinical ASCVD: Yes  (PAD) The 10-year ASCVD risk score Mikey Bussing DC Jr., et al., 2013) is: 17.7%   Values used to calculate the score:     Age: 25 years     Sex: Female     Is Non-Hispanic African American: Yes     Diabetic: Yes     Tobacco smoker: No     Systolic Blood Pressure: 732 mmHg     Is BP treated: Yes     HDL Cholesterol: 49 mg/dL     Total Cholesterol: 143 mg/dL    Depression screen [facility] 2/9 07/19/2020 04/18/2020 02/03/2019  Decreased Interest 0 0 0  Down, Depressed, Hopeless 0 0 0  PHQ - 2 Score 0 0 0  Altered sleeping - - 0  Tired, decreased energy - - 1  Change in appetite - - 1  Feeling bad or failure about yourself  - - 0  Trouble concentrating - - 0  Moving slowly or fidgety/restless - - 0  Suicidal thoughts - - 0  PHQ-9 Score - - 2  Difficult doing work/chores - - Not difficult at all  Some recent data might be hidden      Social History   Tobacco Use  Smoking Status Former Smoker  Smokeless Tobacco Never Used   BP Readings from Last 3 Encounters:  08/24/20 132/86  08/09/20 138/84  07/19/20 128/80   Pulse Readings from Last 3 Encounters:  08/24/20 87  08/09/20 73  07/19/20 60   Wt Readings from Last 3 Encounters:  08/24/20 211 lb (95.7 kg)  08/09/20 213 lb (96.6 kg)  07/19/20 216 lb (98 kg)   BMI Readings from Last 3 Encounters:  08/24/20 36.22 kg/m  08/09/20 36.56 kg/m  07/19/20 37.08 kg/m    Assessment/Interventions: Review of patient past medical history, allergies, medications, health status, including review of consultants reports, laboratory and other test data, was performed as part of comprehensive evaluation and provision of chronic care management services.   SDOH:  (Social Determinants of Health) assessments and interventions performed: Yes  SDOH  Screenings   Alcohol Screen: Low Risk   . Last Alcohol Screening Score (AUDIT): 0  Depression (PHQ2-9): Low Risk   . PHQ-2 Score: 0  Financial Resource Strain: Low Risk   . Difficulty of Paying Living Expenses: Not hard at all  Food Insecurity: No Food Insecurity  . Worried About Charity fundraiser in the Last Year: Never true  . Ran Out of Food in the Last Year: Never true  Housing: Low Risk   . Last Housing Risk Score: 0  Physical Activity: Sufficiently Active  . Days of Exercise per Week: 5 days  . Minutes of Exercise  per Session: 30 min  Social Connections: Moderately Integrated  . Frequency of Communication with Friends and Family: More than three times a week  . Frequency of Social Gatherings with Friends and Family: More than three times a week  . Attends Religious Services: More than 4 times per year  . Active Member of Clubs or Organizations: No  . Attends Archivist Meetings: More than 4 times per year  . Marital Status: Widowed  Stress: No Stress Concern Present  . Feeling of Stress : Not at all  Tobacco Use: Medium Risk  . Smoking Tobacco Use: Former Smoker  . Smokeless Tobacco Use: Never Used  Transportation Needs: No Transportation Needs  . Lack of Transportation (Medical): No  . Lack of Transportation (Non-Medical): No    CCM Care Plan  Allergies  Allergen Reactions  . Lisinopril Cough    Medications Reviewed Today    Reviewed by Charlton Haws, [facility] (Pharmacist) on 10/03/20 at [place] List Status: <None>  Medication Order Taking? Sig Documenting Provider Last Dose Status Informant  Accu-Chek FastClix Lancets MISC [id number] Yes Inject 1 Act into the skin 3 (three) times daily. [name], MD Taking Active   allopurinol (ZYLOPRIM) 300 MG tablet [id number] Yes Take 1 tablet by mouth once daily Lyndal Pulley, DO Taking Active   aspirin EC 81 MG tablet [id number] Yes Take 1 tablet (81 mg total) by mouth daily. Dorothy Spark, MD  Taking Active Self  atorvastatin (LIPITOR) 40 MG tablet [id number] Yes Take 1 tablet by mouth once daily [name], MD Taking Active   Blood Glucose Monitoring Suppl (ACCU-CHEK GUIDE) w/Device KIT [id number] Yes 1 Act by Does not apply route 3 (three) times daily. [name], MD Taking Active   Calcium Carb-Cholecalciferol (CALCIUM 600 + D PO) [id number] Yes Take 1 tablet by mouth daily. [provider] Taking Active Self  carvedilol (COREG) 6.25 MG tablet [id number] Yes TAKE 1 TABLET BY MOUTH TWICE DAILY WITH A MEAL [name], MD Taking Active   Cinnamon 500 MG TABS [id number] Yes Take by mouth. [provider] Taking Active   citalopram (CELEXA) 20 MG tablet [id number] Yes Take 1 tablet by mouth once daily [name], MD Taking Active   dapagliflozin propanediol (FARXIGA) 10 MG TABS tablet [id number] Yes Take 1 tablet (10 mg total) by mouth daily before breakfast. Richardson Dopp T, PA-C Taking Active   dexlansoprazole (DEXILANT) 60 MG capsule [id number] Yes Take 60 mg by mouth daily. [provider] Taking Active   ENTRESTO 97-103 MG [id number] Yes Take 1 tablet by mouth twice daily Dorothy Spark, MD Taking Active   gabapentin (NEURONTIN) 600 MG tablet [id number] Yes TAKE 1 TABLET BY MOUTH THREE TIMES DAILY [name], MD Taking Active   glucose blood (ACCU-CHEK GUIDE) test strip [id number] Yes Use TID [name], MD Taking Active   Lancets Misc. (ACCU-CHEK FASTCLIX LANCET) KIT [id number] Yes Inject 1 Act into the skin 3 (three) times daily. [name], MD Taking Active   leflunomide (ARAVA) 20 MG tablet [id number] Yes Take 1 tablet by mouth once daily [name], MD Taking Active   mirabegron ER (MYRBETRIQ) 50 MG TB24 tablet [id number] Yes Take 1 tablet (50 mg total) by mouth daily. [name], MD Taking Active   Omega-3 Fatty Acids (FISH OIL) 1200 MG CAPS [id number] Yes Take 1,200 mg by mouth daily.  [provider] Taking  Active Self  PROAIR  RESPICLICK 027 (90 Base) MCG/ACT AEPB [id number] Yes INHALE 2 PUFFS BY MOUTH 4 TIMES DAILY AS NEEDED [name], MD Taking Active   spironolactone (ALDACTONE) 25 MG tablet [id number] Yes Take 0.5 tablets (12.5 mg total) by mouth daily. Richardson Dopp T, PA-C Taking Active   Tart Cherry 1200 MG CAPS [id number] Yes Take 1 capsule by mouth daily. [provider] Taking Active   Turmeric 500 MG CAPS [id number] Yes Take 1 capsule by mouth daily. [provider] Taking Active           Patient Active Problem List   Diagnosis Date Noted  . Need for prophylactic vaccination with combined diphtheria-tetanus-pertussis (DTP) vaccine 08/29/2020  . Encounter for general adult medical examination with abnormal findings 08/29/2020  . Stage 3a chronic kidney disease ([place]) 11/10/2019  . OAB (overactive bladder) 11/09/2019  . Greater trochanteric bursitis of both hips 11/09/2019  . Degenerative arthritis of knee, bilateral 10/24/2018  . Vitamin B12 deficiency anemia due to intrinsic factor deficiency 11/26/2017  . Anemia of chronic disease 09/19/2017  . Gastroparesis due to DM ([place]) 02/19/2017  . GERD without esophagitis 02/19/2017  . PAD (peripheral artery disease) ([place]) 10/31/2016  . Visit for screening mammogram 10/31/2016  . Routine general medical examination at a health care facility 03/31/2016  . History of colonic polyps 03/09/2015  . Abnormal nuclear stress test   . Cardiomyopathy ([place]) 10/19/2014  . Cardiac LV ejection fraction 30-35% 09/01/2014  . Spondylolisthesis of lumbar region 10/08/2013  . Rheumatoid arthritis involving multiple joints ([place]) 02/27/2013  . Marijuana dependence ([place]) 01/31/2013  . Spinal stenosis of lumbar region 01/30/2013  . Type II diabetes mellitus with manifestations ([place]) 01/30/2013  . Essential hypertension, benign 01/30/2013  . Hyperlipidemia with target LDL less than 100 01/30/2013    Immunization History   Administered Date(s) Administered  . Fluad Quad(high Dose 65+) 04/18/2020  . Influenza, Quadrivalent, Recombinant, Inj, Pf 04/03/2018  . Influenza,inj,Quad PF,6+ Mos 01/30/2013, 02/08/2014, 03/09/2015, 03/30/2016, 02/19/2017, 01/22/2019  . PFIZER(Purple Top)SARS-COV-2 Vaccination 08/08/2019, 09/02/2019, 05/18/2020  . Pneumococcal Polysaccharide-23 08/13/2017  . Pneumococcal-Unspecified 05/29/2011  . Tdap 01/10/2011    Conditions to be addressed/monitored:  Hypertension, Hyperlipidemia, Diabetes, Heart Failure, Coronary Artery Disease, GERD, Chronic Kidney Disease, Anxiety, Osteoarthritis, Overactive Bladder and Gout  Care Plan : [place]  Updates made by Charlton Haws, [place] since 10/03/2020 12:00 AM    Problem: Hypertension, Hyperlipidemia, Diabetes, Heart Failure, Coronary Artery Disease, GERD, Chronic Kidney Disease, Anxiety, Osteoarthritis, Overactive Bladder and Gout   Priority: High    Long-Range Goal: Disease management   Start Date: 10/03/2020  Expected End Date: 04/05/2021  This Visit's Progress: On track  Priority: High  Note:   Current Barriers:  Unable to independently monitor therapeutic efficacy Unable to maintain control of CKD  Pharmacist Clinical Goal(s):  Patient will achieve adherence to monitoring guidelines and medication adherence to achieve therapeutic efficacy adhere to plan to optimize therapeutic regimen for CKD as evidenced by report of adherence to recommended medication management changes through collaboration with PharmD and provider.   Interventions: 1:1 collaboration with [name], MD regarding development and update of comprehensive plan of care as evidenced by provider attestation and co-signature Inter-disciplinary care team collaboration (see longitudinal plan of care) Comprehensive medication review performed; medication list updated in electronic medical record  Hyperlipidemia / CAD / PAD: (LDL goal < 70) -Controlled  - LDL is at goal; pt endorses compliance and denies side effects -hx mild nonobstructive CAD on cath  10/2014 -Current treatment: Atorvastatin 40 mg daily PM Omega-3 Fish oil 1200 mg Aspirin 81 mg daily -Educated on Cholesterol goals;  Benefits of statin for ASCVD risk reduction; -Recommended to continue current medication  Diabetes / CKD 3b (A1c goal <7%) -Controlled - last A1c at goal however pt was still on metformin at that time; since stopping metformin pt reports BG has been higher than normal; pt wants to know if she should restart metformin -Current medications: Farxiga 10 mg daily Testing supplies -Medications previously tried: metformin (CKD) -Current home glucose readings fasting glucose: 125-150 -Denies hypoglycemic/hyperglycemic symptoms -Educated on A1c and blood sugar goals; Complications of diabetes including kidney damage, retinal damage, and cardiovascular disease; -Counseled on staying off of metformin until f/u with nephrologist -Recommended to continue current medication  Heart Failure / HTN (Goal: BP < 130/80 and prevent exacerbations) -Controlled - home BP is at goal; pt endorses compliance and denies side effects -Last ejection fraction: 40-45% (Date: 04/2020) -HF type: systolic (NICM) -NYHA Class: II (slight limitation of activity) -Current treatment: Carvedilol 6.25 mg BID Entresto 97-103 mg BID Spironolactone 25 mg - 1/2 tab daily PM -Current home BP/HR readings: 118-130/60-80 -Educated on Benefits of medications for managing symptoms and prolonging life -Recommended to continue current medication  Depression/Anxiety (Goal: manage symptoms) -Controlled - pt reports mood is well controlled  -Current treatment: Citalopram 20 mg daily PM -PHQ9: 0 (06/2020) -GAD7: not on file -Connected with PCP for mental health support -Educated on Benefits of medication for symptom control -Recommended to continue current medication  GERD (Goal: manage  symptoms) -Controlled - pt denies GERD symptoms; she is not sure if she needs to continue daily medication -Current treatment  Dexlansoprazole 60 mg daily -Counseled on long term risks associated with PPI -Recommended to reduce PPI to every other day, then PRN  OAB (Goal: manage symptoms) -Controlled - pt reports she is not taking medication and symptoms are tolerable; she did not notice significant improvement while taking Myrbetriq -Current treatment  Myrbetriq 50 mg daily - not taking -Recommended to monitor symptoms  Rheumatoid arthritis (Goal: manage symptoms) -Controlled - pt reports symptoms are relatively under control; she is awaiting appt with rheumatology -osteoarthritis, back pain -Current treatment  Leflunomide 20 mg daily Gabapentin 600 mg TID Tylenol 500 mg  -Counseled on max daily dose of Tylenol ~3000 mg -Recommended to continue current medication  Gout (Goal: prevent flares) -Controlled - pt reports last gout flare was a few weeks ago; she did not treat with medication -Current treatment  Allopurinol 300 mg daily  -Counseled on low purine diet  -Recommended to continue current medication  Health Maintenance -Vaccine gaps: PCV, shingrix -Current therapy:  Tart Cherry 1200 mg Turmeric 500 mg daily Cinnamon  Calcium-Vitamin D Albuterol HFA prn -Patient is satisfied with current therapy and denies issues -Recommended to continue current medication  Patient Goals/Self-Care Activities Patient will:  - take medications as prescribed focus on medication adherence by pill box check glucose daily, document, and provide at future appointments check blood pressure daily, document, and provide at future appointments  Follow Up Plan: Telephone follow up appointment with care management team member scheduled for: 3 months      Medication Assistance: None required.  Patient affirms current coverage meets needs.  Patient's preferred pharmacy is:  [facility], [address] [place] [address] Phone: [phone] Fax: [phone]  Uses pill box? Yes Pt endorses 100% compliance  We discussed: Current  pharmacy is preferred with insurance plan and patient is satisfied with pharmacy services Patient decided to: Continue current medication management strategy  Care Plan and Follow Up Patient Decision:  Patient agrees to Care Plan and Follow-up.  Plan: Telephone follow up appointment with care management team member scheduled for:  3 months  Charlene Brooke, PharmD, [place], CPP Clinical Pharmacist [facility] at [facility] [phone]

## 2020-10-14 DIAGNOSIS — M0579 Rheumatoid arthritis with rheumatoid factor of multiple sites without organ or systems involvement: Secondary | ICD-10-CM | POA: Diagnosis not present

## 2020-10-14 DIAGNOSIS — M1A09X0 Idiopathic chronic gout, multiple sites, without tophus (tophi): Secondary | ICD-10-CM | POA: Diagnosis not present

## 2020-10-14 DIAGNOSIS — M5136 Other intervertebral disc degeneration, lumbar region: Secondary | ICD-10-CM | POA: Diagnosis not present

## 2020-10-14 DIAGNOSIS — M2550 Pain in unspecified joint: Secondary | ICD-10-CM | POA: Diagnosis not present

## 2020-10-14 DIAGNOSIS — M150 Primary generalized (osteo)arthritis: Secondary | ICD-10-CM | POA: Diagnosis not present

## 2020-10-23 ENCOUNTER — Other Ambulatory Visit: Payer: Self-pay | Admitting: Internal Medicine

## 2020-10-25 DIAGNOSIS — E1122 Type 2 diabetes mellitus with diabetic chronic kidney disease: Secondary | ICD-10-CM | POA: Diagnosis not present

## 2020-10-25 DIAGNOSIS — D631 Anemia in chronic kidney disease: Secondary | ICD-10-CM | POA: Diagnosis not present

## 2020-10-25 DIAGNOSIS — I129 Hypertensive chronic kidney disease with stage 1 through stage 4 chronic kidney disease, or unspecified chronic kidney disease: Secondary | ICD-10-CM | POA: Diagnosis not present

## 2020-10-25 DIAGNOSIS — N2581 Secondary hyperparathyroidism of renal origin: Secondary | ICD-10-CM | POA: Diagnosis not present

## 2020-10-25 DIAGNOSIS — N1831 Chronic kidney disease, stage 3a: Secondary | ICD-10-CM | POA: Diagnosis not present

## 2020-10-27 ENCOUNTER — Other Ambulatory Visit: Payer: Self-pay | Admitting: Nephrology

## 2020-10-27 ENCOUNTER — Other Ambulatory Visit: Payer: Self-pay | Admitting: Internal Medicine

## 2020-10-27 DIAGNOSIS — N1831 Chronic kidney disease, stage 3a: Secondary | ICD-10-CM

## 2020-10-27 DIAGNOSIS — M069 Rheumatoid arthritis, unspecified: Secondary | ICD-10-CM

## 2020-11-01 ENCOUNTER — Other Ambulatory Visit: Payer: Self-pay

## 2020-11-01 ENCOUNTER — Encounter: Payer: Self-pay | Admitting: Internal Medicine

## 2020-11-01 DIAGNOSIS — M5416 Radiculopathy, lumbar region: Secondary | ICD-10-CM

## 2020-11-07 ENCOUNTER — Ambulatory Visit
Admission: RE | Admit: 2020-11-07 | Discharge: 2020-11-07 | Disposition: A | Discharge status: Home / Self Care | Payer: Medicare Other | Source: Ambulatory Visit | Attending: Family Medicine | Admitting: Family Medicine

## 2020-11-07 ENCOUNTER — Other Ambulatory Visit: Payer: Self-pay

## 2020-11-07 ENCOUNTER — Other Ambulatory Visit: Payer: Self-pay | Admitting: Family Medicine

## 2020-11-07 DIAGNOSIS — M5416 Radiculopathy, lumbar region: Secondary | ICD-10-CM

## 2020-11-07 DIAGNOSIS — M5417 Radiculopathy, lumbosacral region: Secondary | ICD-10-CM | POA: Diagnosis not present

## 2020-11-07 MED ORDER — METHYLPREDNISOLONE ACETATE 40 MG/ML INJ SUSP (RADIOLOG
80.0000 mg | Freq: Once | INTRAMUSCULAR | Status: AC
  Administered 2020-11-07: 80 mg via EPIDURAL

## 2020-11-07 MED ORDER — IOPAMIDOL (ISOVUE-M 200) INJECTION 41%
1.0000 mL | Freq: Once | INTRAMUSCULAR | Status: AC
  Administered 2020-11-07: 1 mL via EPIDURAL

## 2020-11-07 NOTE — Discharge Instructions (Signed)
Post Procedure Spinal Discharge Instruction Sheet  You may resume a regular diet and any medications that you routinely take (including pain medications).  No driving day of procedure.  Light activity throughout the rest of the day.  Do not do any strenuous work, exercise, bending or lifting.  The day following the procedure, you can resume normal physical activity but you should refrain from exercising or physical therapy for at least three days thereafter.   Common Side Effects:  Headaches- take your usual medications as directed by your physician.  Increase your fluid intake.  Caffeinated beverages may be helpful.  Lie flat in bed until your headache resolves.  Restlessness or inability to sleep- you may have trouble sleeping for the next few days.  Ask your referring physician if you need any medication for sleep.  Facial flushing or redness- should subside within a few days.  Increased pain- a temporary increase in pain a day or two following your procedure is not unusual.  Take your pain medication as prescribed by your referring physician.  Leg cramps  Please contact our office at [phone] for the following symptoms: Fever greater than 100 degrees. Headaches unresolved with medication after 2-3 days. Increased swelling, pain, or redness at injection site.   Thank you for visiting [facility] today.    YOU MAY RESUME YOUR ASPIRIN ANYTIME AFTER INJECTION TODAY 11/07/20

## 2020-11-14 ENCOUNTER — Ambulatory Visit
Admission: RE | Admit: 2020-11-14 | Discharge: 2020-11-14 | Disposition: A | Discharge status: Home / Self Care | Payer: Medicare Other | Source: Ambulatory Visit | Attending: Nephrology | Admitting: Nephrology

## 2020-11-14 DIAGNOSIS — N183 Chronic kidney disease, stage 3 unspecified: Secondary | ICD-10-CM | POA: Diagnosis not present

## 2020-11-14 DIAGNOSIS — N1831 Chronic kidney disease, stage 3a: Secondary | ICD-10-CM

## 2020-11-20 ENCOUNTER — Other Ambulatory Visit: Payer: Self-pay | Admitting: Internal Medicine

## 2020-12-17 ENCOUNTER — Other Ambulatory Visit: Payer: Self-pay | Admitting: Internal Medicine

## 2020-12-17 ENCOUNTER — Other Ambulatory Visit: Payer: Self-pay | Admitting: Family Medicine

## 2020-12-17 DIAGNOSIS — I10 Essential (primary) hypertension: Secondary | ICD-10-CM

## 2020-12-29 ENCOUNTER — Ambulatory Visit: Payer: Medicare Other | Admitting: Internal Medicine

## 2021-01-03 ENCOUNTER — Telehealth: Payer: Self-pay | Admitting: Pharmacist

## 2021-01-03 NOTE — Progress Notes (Signed)
    Chronic Care Management Pharmacy Assistant   Name: Emily Wheeler  MRN: [id number] DOB: 02/14/1956  Reason for Encounter: Reschedule appointment  Recent office visits:  None since last CCM contact  Recent consult visits:  10/14/20- Dr. Leafy Kindle- Rheumatology- Patient presented for rheumatoid arthritis follow up. No additional data available.   Hospital visits:  None in previous 6 months  Medications: Outpatient Encounter Medications as of 01/03/2021  Medication Sig   Accu-Chek FastClix Lancets MISC Inject 1 Act into the skin 3 (three) times daily.   allopurinol (ZYLOPRIM) 300 MG tablet Take 1 tablet by mouth once daily   aspirin EC 81 MG tablet Take 1 tablet (81 mg total) by mouth daily.   atorvastatin (LIPITOR) 40 MG tablet Take 1 tablet by mouth once daily   Blood Glucose Monitoring Suppl (ACCU-CHEK GUIDE) w/Device KIT 1 Act by Does not apply route 3 (three) times daily.   Calcium Carb-Cholecalciferol (CALCIUM 600 + D PO) Take 1 tablet by mouth daily.   carvedilol (COREG) 6.25 MG tablet TAKE 1 TABLET BY MOUTH TWICE DAILY WITH A MEAL   Cinnamon 500 MG TABS Take by mouth.   citalopram (CELEXA) 20 MG tablet Take 1 tablet by mouth once daily   dapagliflozin propanediol (FARXIGA) 10 MG TABS tablet Take 1 tablet (10 mg total) by mouth daily before breakfast.   DEXILANT 60 MG capsule Take 1 capsule by mouth once daily   ENTRESTO 97-103 MG Take 1 tablet by mouth twice daily   gabapentin (NEURONTIN) 600 MG tablet TAKE 1 TABLET BY MOUTH THREE TIMES DAILY   glucose blood (ACCU-CHEK GUIDE) test strip Use TID   Lancets Misc. (ACCU-CHEK FASTCLIX LANCET) KIT Inject 1 Act into the skin 3 (three) times daily.   leflunomide (ARAVA) 20 MG tablet Take 1 tablet by mouth once daily   mirabegron ER (MYRBETRIQ) 50 MG TB24 tablet Take 1 tablet (50 mg total) by mouth daily.   Omega-3 Fatty Acids (FISH OIL) 1200 MG CAPS Take 1,200 mg by mouth daily.    PROAIR RESPICLICK 354 (90 Base) MCG/ACT  AEPB INHALE 2 PUFFS BY MOUTH 4 TIMES DAILY AS NEEDED   spironolactone (ALDACTONE) 25 MG tablet Take 0.5 tablets (12.5 mg total) by mouth daily.   Tart Cherry 1200 MG CAPS Take 1 capsule by mouth daily.   Turmeric 500 MG CAPS Take 1 capsule by mouth daily.   No facility-administered encounter medications on file as of 01/03/2021.    [name]  was contacted to reschedule CCM follow up appointment. Follow up visit was rescheduled for 01/09/21 at 9:00.   Are you having any problems with your medications? Not at this time  Any concerns patient would like to discuss with the pharmacist? No  Patient reminded to have all medications, supplements, any blood glucose and blood pressure readings available for review with Charlene Brooke, at their telephone visit on 01/09/21.    Star Rating Drugs:  Medication:  Last Fill: Day Supply Atorvastatin 40 mg 11/29/20  90 Farxiga 10 mg  12/21/20 90 Entresto 97-103 mg 10/23/20 [place], CPP notified  Margaretmary Dys, [place] Pharmacy Assistant [phone]

## 2021-01-04 ENCOUNTER — Telehealth: Payer: Medicare Other

## 2021-01-05 ENCOUNTER — Telehealth: Payer: Medicare Other

## 2021-01-09 ENCOUNTER — Ambulatory Visit (INDEPENDENT_AMBULATORY_CARE_PROVIDER_SITE_OTHER): Payer: Medicare Other | Admitting: Pharmacist

## 2021-01-09 ENCOUNTER — Other Ambulatory Visit: Payer: Self-pay

## 2021-01-09 DIAGNOSIS — E785 Hyperlipidemia, unspecified: Secondary | ICD-10-CM

## 2021-01-09 DIAGNOSIS — I10 Essential (primary) hypertension: Secondary | ICD-10-CM | POA: Diagnosis not present

## 2021-01-09 DIAGNOSIS — M069 Rheumatoid arthritis, unspecified: Secondary | ICD-10-CM | POA: Diagnosis not present

## 2021-01-09 DIAGNOSIS — N1831 Chronic kidney disease, stage 3a: Secondary | ICD-10-CM

## 2021-01-09 DIAGNOSIS — E118 Type 2 diabetes mellitus with unspecified complications: Secondary | ICD-10-CM

## 2021-01-09 NOTE — Progress Notes (Signed)
Chronic Care Management Pharmacy Note  01/09/2021 Name:  Emily Wheeler MRN:  [id number] DOB:  1955-08-12  Summary: -Pt endorses compliance with medications. Reported home BP and BG readings are at goal.  Recommendations/Changes made from today's visit: -Counseled vaccine gaps: Shingrix, Covid booster, TD booster   Subjective: Emily Wheeler is an 65 y.o. year old female who is a primary patient of [name], MD.  The CCM team was consulted for assistance with disease management and care coordination needs.    Engaged with patient by telephone for follow up visit in response to provider referral for pharmacy case management and/or care coordination services.   Consent to Services:  The patient was given information about Chronic Care Management services, agreed to services, and gave verbal consent prior to initiation of services.  Please see initial visit note for detailed documentation.   Patient Care Team: [name], MD as PCP - General (Internal Medicine) Dorothy Spark, MD (Inactive) as PCP - Cardiology (Cardiology) Opthamology, [facility] as Consulting Physician (Ophthalmology) Charlton Haws, [facility] as Pharmacist (Pharmacist)  Recent office visits: 08/24/20 Ronnald Ramp (PCP) - Annual. Tdap given. D/C metformin due to A1c 6.0.Marland Kitchen Referrals for Ophthalmology & Nephrology.   04/18/20 Ronnald Ramp (PCP) - Anemia.Hypertension.Diabetes. flu vaccine given. f/u 6 months.  Recent consult visits: 10/14/20 Dr Annamaria Boots (rheumatology): RA, polyarthralgia.  08/10/19 [facility] (Sports Medicine) - Bilateral hip pain. f/u 2 months. Referred to rheumatology   05/06/20 [facility] (Cardiology) - f/u heart failure. Start Farxiga 10 mg.    04/25/20 [facility] (Sports Medicine) - Chronic pain both knees. Pain injections given every 12 weeks. F/u 6-8 weeks.  Hospital visits: None in previous 6 months  Objective:  Lab Results  Component Value Date   CREATININE 1.29 (H) 08/09/2020   BUN 17  08/09/2020   GFR 43.27 (L) 08/09/2020   GFRNONAA 44 (L) 06/21/2020   GFRAA 51 (L) 06/21/2020   NA 141 08/09/2020   K 4.3 08/09/2020   CALCIUM 9.5 08/09/2020   CALCIUM 9.4 08/09/2020   CO2 25 08/09/2020   GLUCOSE 103 (H) 08/09/2020    Lab Results  Component Value Date/Time   HGBA1C 6.0 08/24/2020 10:32 AM   HGBA1C 6.1 04/18/2020 03:39 PM   GFR 43.27 (L) 08/09/2020 08:06 AM   GFR 55.51 (L) 04/18/2020 03:39 PM   MICROALBUR 1.5 08/24/2020 10:32 AM   MICROALBUR 1.6 11/09/2019 11:56 AM    Last diabetic Eye exam:  Lab Results  Component Value Date/Time   HMDIABEYEEXA No Retinopathy 06/30/2019 12:00 AM    Last diabetic Foot exam:  Lab Results  Component Value Date/Time   HMDIABFOOTEX done 11/26/2017 12:00 AM     Lab Results  Component Value Date   CHOL 143 11/09/2019   HDL 49.00 11/09/2019   LDLCALC 70 11/09/2019   LDLDIRECT 162.5 01/30/2013   TRIG 120.0 11/09/2019   CHOLHDL 3 11/09/2019    Hepatic Function Latest Ref Rng & Units 08/09/2020 08/09/2020 11/09/2019  Total Protein 6.1 - 8.1 g/dL 6.3 7.0 6.7  Albumin 3.5 - 5.2 g/dL - 4.1 4.3  AST 0 - 37 U/L - 15 15  ALT 0 - 35 U/L - 13 12  Alk Phosphatase 39 - 117 U/L - 69 68  Total Bilirubin 0.2 - 1.2 mg/dL - 0.5 0.4  Bilirubin, Direct 0.0 - 0.3 mg/dL - - 0.0    Lab Results  Component Value Date/Time   TSH 3.17 08/09/2020 08:06 AM   TSH 0.97 11/09/2019 11:56 AM  CBC Latest Ref Rng & Units 08/09/2020 04/18/2020 11/09/2019  WBC 4.0 - 10.5 K/uL 3.9(L) 3.7(L) 4.0  Hemoglobin 12.0 - 15.0 g/dL 10.7(L) 10.2(L) 9.9(L)  Hematocrit 36.0 - 46.0 % 32.7(L) 31.2(L) 30.0(L)  Platelets 150.0 - 400.0 K/uL 263.0 234.0 220.0    Lab Results  Component Value Date/Time   VD25OH 50.07 08/09/2020 08:06 AM    Clinical ASCVD: Yes  (PAD) The 10-year ASCVD risk score Mikey Bussing DC Jr., et al., 2013) is: 32.8%   Values used to calculate the score:     Age: 48 years     Sex: Female     Is Non-Hispanic African American: Yes     Diabetic:  Yes     Tobacco smoker: No     Systolic Blood Pressure: 741 mmHg     Is BP treated: Yes     HDL Cholesterol: 49 mg/dL     Total Cholesterol: 143 mg/dL    Depression screen [facility] 2/9 07/19/2020 04/18/2020 02/03/2019  Decreased Interest 0 0 0  Down, Depressed, Hopeless 0 0 0  PHQ - 2 Score 0 0 0  Altered sleeping - - 0  Tired, decreased energy - - 1  Change in appetite - - 1  Feeling bad or failure about yourself  - - 0  Trouble concentrating - - 0  Moving slowly or fidgety/restless - - 0  Suicidal thoughts - - 0  PHQ-9 Score - - 2  Difficult doing work/chores - - Not difficult at all  Some recent data might be hidden      Social History   Tobacco Use  Smoking Status Former  Smokeless Tobacco Never   BP Readings from Last 3 Encounters:  11/07/20 (!) 179/98  08/24/20 132/86  08/09/20 138/84   Pulse Readings from Last 3 Encounters:  11/07/20 91  08/24/20 87  08/09/20 73   Wt Readings from Last 3 Encounters:  08/24/20 211 lb (95.7 kg)  08/09/20 213 lb (96.6 kg)  07/19/20 216 lb (98 kg)   BMI Readings from Last 3 Encounters:  08/24/20 36.22 kg/m  08/09/20 36.56 kg/m  07/19/20 37.08 kg/m    Assessment/Interventions: Review of patient past medical history, allergies, medications, health status, including review of consultants reports, laboratory and other test data, was performed as part of comprehensive evaluation and provision of chronic care management services.   SDOH:  (Social Determinants of Health) assessments and interventions performed: Yes  SDOH Screenings   Alcohol Screen: Low Risk    Last Alcohol Screening Score (AUDIT): 0  Depression (PHQ2-9): Low Risk    PHQ-2 Score: 0  Financial Resource Strain: Low Risk    Difficulty of Paying Living Expenses: Not hard at all  Food Insecurity: No Food Insecurity   Worried About Charity fundraiser in the Last Year: Never true   Ran Out of Food in the Last Year: Never true  Housing: Low Risk    Last Housing Risk  Score: 0  Physical Activity: Sufficiently Active   Days of Exercise per Week: 5 days   Minutes of Exercise per Session: 30 min  Social Connections: Moderately Integrated   Frequency of Communication with Friends and Family: More than three times a week   Frequency of Social Gatherings with Friends and Family: More than three times a week   Attends Religious Services: More than 4 times per year   Active Member of Clubs or Organizations: No   Attends Music therapist: More than 4 times per year  Marital Status: Widowed  Stress: No Stress Concern Present   Feeling of Stress : Not at all  Tobacco Use: Medium Risk   Smoking Tobacco Use: Former   Smokeless Tobacco Use: Never  Transportation Needs: No Data processing manager (Medical): No   Lack of Transportation (Non-Medical): No    CCM Care Plan  Allergies  Allergen Reactions   Lisinopril Cough    Medications Reviewed Today     Reviewed by Charlton Haws, [facility] (Pharmacist) on 01/09/21 at [id number]  Med List Status: <None>   Medication Order Taking? Sig Documenting Provider Last Dose Status Informant  Accu-Chek FastClix Lancets MISC [id number] Yes Inject 1 Act into the skin 3 (three) times daily. [name], MD Taking Active   allopurinol (ZYLOPRIM) 300 MG tablet [id number] Yes Take 1 tablet by mouth once daily Lyndal Pulley, DO Taking Active   aspirin EC 81 MG tablet [id number] Yes Take 1 tablet (81 mg total) by mouth daily. Dorothy Spark, MD Taking Active Self  atorvastatin (LIPITOR) 40 MG tablet [id number] Yes Take 1 tablet by mouth once daily [name], MD Taking Active   Biotin 10 MG TABS [id number] Yes Take by mouth. [provider] Taking Active   Blood Glucose Monitoring Suppl (ACCU-CHEK GUIDE) w/Device KIT [id number] Yes 1 Act by Does not apply route 3 (three) times daily. [name], MD Taking Active   Calcium Carb-Cholecalciferol (CALCIUM 600 + D PO) [id number]  Yes Take 1 tablet by mouth daily. [provider] Taking Active Self  carvedilol (COREG) 6.25 MG tablet [id number] Yes TAKE 1 TABLET BY MOUTH TWICE DAILY WITH A MEAL [name], MD Taking Active   Cinnamon 500 MG TABS [id number] Yes Take by mouth. [provider] Taking Active   citalopram (CELEXA) 20 MG tablet [id number] Yes Take 1 tablet by mouth once daily [name], MD Taking Active   dapagliflozin propanediol (FARXIGA) 10 MG TABS tablet [id number] Yes Take 1 tablet (10 mg total) by mouth daily before breakfast. Liliane Shi, PA-C Taking Active   DEXILANT 60 MG capsule [id number] Yes Take 1 capsule by mouth once daily [name], MD Taking Active   ENTRESTO 97-103 MG [id number] Yes Take 1 tablet by mouth twice daily Dorothy Spark, MD Taking Active   gabapentin (NEURONTIN) 600 MG tablet [id number] Yes TAKE 1 TABLET BY MOUTH THREE TIMES DAILY [name], MD Taking Active   glucose blood (ACCU-CHEK GUIDE) test strip [id number] Yes Use TID [name], MD Taking Active   Lancets Misc. (ACCU-CHEK FASTCLIX LANCET) KIT [id number] Yes Inject 1 Act into the skin 3 (three) times daily. [name], MD Taking Active   leflunomide (ARAVA) 20 MG tablet [id number] Yes Take 1 tablet by mouth once daily [name], MD Taking Active   Omega-3 Fatty Acids (FISH OIL) 1200 MG CAPS [id number] Yes Take 1,200 mg by mouth daily.  [provider] Taking Active Self  PROAIR RESPICLICK 835 [phone] Base) MCG/ACT AEPB [id number] Yes INHALE 2 PUFFS BY MOUTH 4 TIMES DAILY AS NEEDED [name], MD Taking Active   spironolactone (ALDACTONE) 25 MG tablet [id number] Yes Take 0.5 tablets (12.5 mg total) by mouth daily. Richardson Dopp T, PA-C Taking Active   Tart Cherry 1200 MG CAPS [id number] Yes Take 1 capsule by mouth daily. [provider] Taking Active   Turmeric 500 MG CAPS [id number] Yes Take 1 capsule by mouth daily. [provider]  Taking Active              Patient Active Problem List   Diagnosis Date Noted   Need for prophylactic vaccination with combined diphtheria-tetanus-pertussis (DTP) vaccine 08/29/2020   Encounter for general adult medical examination with abnormal findings 08/29/2020   Stage 3a chronic kidney disease ([place]) 11/10/2019   OAB (overactive bladder) 11/09/2019   Greater trochanteric bursitis of both hips 11/09/2019   Degenerative arthritis of knee, bilateral 10/24/2018   Vitamin B12 deficiency anemia due to intrinsic factor deficiency 11/26/2017   Anemia of chronic disease 09/19/2017   Gastroparesis due to DM ([place]) 02/19/2017   GERD without esophagitis 02/19/2017   PAD (peripheral artery disease) ([place]) 10/31/2016   Visit for screening mammogram 10/31/2016   Routine general medical examination at a health care facility 03/31/2016   History of colonic polyps 03/09/2015   Abnormal nuclear stress test    Cardiomyopathy ([place]) 10/19/2014   Cardiac LV ejection fraction 30-35% 09/01/2014   Spondylolisthesis of lumbar region 10/08/2013   Rheumatoid arthritis involving multiple joints ([place]) 02/27/2013   Marijuana dependence ([place]) 01/31/2013   Spinal stenosis of lumbar region 01/30/2013   Type II diabetes mellitus with manifestations ([place]) 01/30/2013   Essential hypertension, benign 01/30/2013   Hyperlipidemia with target LDL less than 100 01/30/2013    Immunization History  Administered Date(s) Administered   Fluad Quad(high Dose 65+) 04/18/2020   Influenza, Quadrivalent, Recombinant, Inj, Pf 04/03/2018   Influenza,inj,Quad PF,6+ Mos 01/30/2013, 02/08/2014, 03/09/2015, 03/30/2016, 02/19/2017, 01/22/2019   PFIZER(Purple Top)SARS-COV-2 Vaccination 08/08/2019, 09/02/2019, 05/18/2020   Pneumococcal Polysaccharide-23 08/13/2017   Pneumococcal-Unspecified 05/29/2011   Tdap 01/10/2011    Conditions to be addressed/monitored:  Hypertension, Hyperlipidemia, Diabetes, Heart Failure, Coronary Artery Disease, and Chronic  Kidney Disease  Care Plan : [place]  Updates made by Charlton Haws, [place] since 01/09/2021 12:00 AM     Problem: Hypertension, Hyperlipidemia, Diabetes, Heart Failure, Coronary Artery Disease, and Chronic Kidney Disease   Priority: High     Long-Range Goal: Disease management   Start Date: 10/03/2020  Expected End Date: 04/05/2021  This Visit's Progress: On track  Recent Progress: On track  Priority: High  Note:   Current Barriers:  Unable to independently monitor therapeutic efficacy  Pharmacist Clinical Goal(s):  Patient will achieve adherence to monitoring guidelines and medication adherence to achieve therapeutic efficacy adhere to plan to optimize therapeutic regimen for CKD as evidenced by report of adherence to recommended medication management changes through collaboration with PharmD and provider.   Interventions: 1:1 collaboration with [name], MD regarding development and update of comprehensive plan of care as evidenced by provider attestation and co-signature Inter-disciplinary care team collaboration (see longitudinal plan of care) Comprehensive medication review performed; medication list updated in electronic medical record  Hyperlipidemia / CAD / PAD: (LDL goal < 70) -Controlled - LDL is at goal; pt endorses compliance and denies side effects -hx mild nonobstructive CAD on cath 10/2014 -Current treatment: Atorvastatin 40 mg daily PM Omega-3 Fish oil 1200 mg Aspirin 81 mg daily -Educated on Cholesterol goals; Benefits of statin for ASCVD risk reduction; -Recommended to continue current medication  Diabetes / CKD 3b (A1c goal <7%) -Controlled - pt endorses compliance with medications; fasting BG are at goal -Current home glucose readings fasting glucose: 108, 125 -Current medications: Farxiga 10 mg daily Testing supplies -Medications previously tried: metformin (CKD) -Educated on A1c and blood sugar goals; Complications of diabetes  including kidney damage, retinal damage, and cardiovascular disease; -Recommended  to continue current medication  Heart Failure / HTN (Goal: BP < 130/80 and prevent exacerbations) -Controlled - home BP is at goal; pt endorses compliance and denies side effects -Last ejection fraction: 40-45% (Date: 04/2020) -HF type: systolic (NICM) -NYHA Class: II (slight limitation of activity) -Current home BP/HR readings: 126/77 -Current treatment: Carvedilol 6.25 mg BID Entresto 97-103 mg BID Spironolactone 25 mg - 1/2 tab daily PM -Educated on Benefits of medications for managing symptoms and prolonging life -Recommended to continue current medication  Rheumatoid arthritis (Goal: manage symptoms) -Controlled - pt reports symptoms are relatively under control; she is established with rheumatology and reports "they can't do anything more for me" -osteoarthritis, back pain -Current treatment  Leflunomide 20 mg daily Gabapentin 600 mg TID Tylenol 500 mg  -Recommended to continue current medication  Health Maintenance -Vaccine gaps: Shingrix, TD booster, COVID booster -Counseled on vaccine gaps, pt agrees to get vaccines over next few months  Patient Goals/Self-Care Activities Patient will:  - take medications as prescribed -focus on medication adherence by pill box -check glucose daily, document, and provide at future appointments -check blood pressure daily, document, and provide at future appointments -Get COVID booster, TD booster and Shingrix vaccines at local pharmacy       Medication Assistance: None required.  Patient affirms current coverage meets needs.  Compliance/Adherence/Medication fill history: Care Gaps: DEXA scan  Eye exam (due 06/29/20) Shingrix Prevnar (due 03/01/19) COVID booster (due 08/16/20)  Star-Rating Drugs: Atorvastatin - LF 11/29/20 x 90 ds Farxiga - LF 12/21/20 x 90 ds Entresto - LF 10/23/20  90 ds  Patient's preferred pharmacy is:  [facility], [place] [place] [address] Phone: [phone] Fax: [phone]  Uses pill box? Yes Pt endorses 100% compliance  We discussed: Current pharmacy is preferred with insurance plan and patient is satisfied with pharmacy services Patient decided to: Continue current medication management strategy  Care Plan and Follow Up Patient Decision:  Patient agrees to Care Plan and Follow-up.  Plan: Telephone follow up appointment with care management team member scheduled for:  3 months  Charlene Brooke, PharmD, [place], CPP Clinical Pharmacist [facility] [phone]

## 2021-01-09 NOTE — Patient Instructions (Signed)
Visit Information  Phone number for Pharmacist: [phone]   Goals Addressed             This Visit's Progress    Manage My Medicine       Timeframe:  Long-Range Goal Priority:  Medium Start Date:           10/03/20                  Expected End Date:    10/03/21                  Follow Up Date Feb 2022   - call for medicine refill 2 or 3 days before it runs out - call if I am sick and can't take my medicine - keep a list of all the medicines I take; vitamins and herbals too - use a pillbox to sort medicine    Why is this important?   These steps will help you keep on track with your medicines.   Notes:         Care Plan : CCM Pharmacy Care Plan  Updates made by Kathyrn Sheriff, RPH since 01/09/2021 12:00 AM     Problem: Hypertension, Hyperlipidemia, Diabetes, Heart Failure, Coronary Artery Disease, and Chronic Kidney Disease   Priority: High     Long-Range Goal: Disease management   Start Date: 10/03/2020  Expected End Date: 04/05/2021  This Visit's Progress: On track  Recent Progress: On track  Priority: High  Note:   Current Barriers:  Unable to independently monitor therapeutic efficacy  Pharmacist Clinical Goal(s):  Patient will achieve adherence to monitoring guidelines and medication adherence to achieve therapeutic efficacy adhere to plan to optimize therapeutic regimen for CKD as evidenced by report of adherence to recommended medication management changes through collaboration with PharmD and provider.   Interventions: 1:1 collaboration with [name], MD regarding development and update of comprehensive plan of care as evidenced by provider attestation and co-signature Inter-disciplinary care team collaboration (see longitudinal plan of care) Comprehensive medication review performed; medication list updated in electronic medical record  Hyperlipidemia / CAD / PAD: (LDL goal < 70) -Controlled - LDL is at goal; pt endorses compliance and  denies side effects -hx mild nonobstructive CAD on cath 10/2014 -Current treatment: Atorvastatin 40 mg daily PM Omega-3 Fish oil 1200 mg Aspirin 81 mg daily -Educated on Cholesterol goals; Benefits of statin for ASCVD risk reduction; -Recommended to continue current medication  Diabetes / CKD 3b (A1c goal <7%) -Controlled - pt endorses compliance with medications; fasting BG are at goal -Current home glucose readings fasting glucose: 108, 125 -Current medications: Farxiga 10 mg daily Testing supplies -Medications previously tried: metformin (CKD) -Educated on A1c and blood sugar goals; Complications of diabetes including kidney damage, retinal damage, and cardiovascular disease; -Recommended to continue current medication  Heart Failure / HTN (Goal: BP < 130/80 and prevent exacerbations) -Controlled - home BP is at goal; pt endorses compliance and denies side effects -Last ejection fraction: 40-45% (Date: 04/2020) -HF type: systolic (NICM) -NYHA Class: II (slight limitation of activity) -Current home BP/HR readings: 126/77 -Current treatment: Carvedilol 6.25 mg BID Entresto 97-103 mg BID Spironolactone 25 mg - 1/2 tab daily PM -Educated on Benefits of medications for managing symptoms and prolonging life -Recommended to continue current medication  Rheumatoid arthritis (Goal: manage symptoms) -Controlled - pt reports symptoms are relatively under control; she is established with rheumatology and reports "they can't do anything more for me" -osteoarthritis, back pain -  Current treatment  Leflunomide 20 mg daily Gabapentin 600 mg TID Tylenol 500 mg  -Recommended to continue current medication  Health Maintenance -Vaccine gaps: Shingrix, TD booster, COVID booster -Counseled on vaccine gaps, pt agrees to get vaccines over next few months  Patient Goals/Self-Care Activities Patient will:  - take medications as prescribed -focus on medication adherence by pill box -check  glucose daily, document, and provide at future appointments -check blood pressure daily, document, and provide at future appointments -Get COVID booster, TD booster and Shingrix vaccines at local pharmacy      Patient verbalizes understanding of instructions provided today and agrees to view in MyChart.  Telephone follow up appointment with pharmacy team member scheduled for: 6 months  Emily Wheeler, PharmD, [place], CPP Clinical Pharmacist  Primary Care at [facility] [phone]

## 2021-01-19 ENCOUNTER — Other Ambulatory Visit: Payer: Self-pay

## 2021-01-19 ENCOUNTER — Ambulatory Visit (INDEPENDENT_AMBULATORY_CARE_PROVIDER_SITE_OTHER): Payer: Medicare Other | Admitting: Family Medicine

## 2021-01-19 ENCOUNTER — Encounter: Payer: Self-pay | Admitting: Family Medicine

## 2021-01-19 DIAGNOSIS — M48062 Spinal stenosis, lumbar region with neurogenic claudication: Secondary | ICD-10-CM | POA: Diagnosis not present

## 2021-01-19 DIAGNOSIS — M170 Bilateral primary osteoarthritis of knee: Secondary | ICD-10-CM | POA: Diagnosis not present

## 2021-01-19 NOTE — Patient Instructions (Signed)
Let me know if you want another epidural can do every 3 months See me again 6-8 weeks for gel injections

## 2021-01-19 NOTE — Assessment & Plan Note (Signed)
Bilateral injection given today.  Tolerated the procedure well, discussed icing regimen and home exercises, discussed avoiding certain activities.  We discussed icing regimen.  Follow-up again 6 weeks.  Patient did respond well to the viscosupplementation may need to consider that again.

## 2021-01-19 NOTE — Progress Notes (Signed)
[facility] [place] Phone: [phone] Subjective:   [name], am serving as a scribe for Dr. Hulan Saas.  This visit occurred during the SARS-CoV-2 public health emergency.  Safety protocols were in place, including screening questions prior to the visit, additional usage of staff PPE, and extensive cleaning of exam room while observing appropriate contact time as indicated for disinfecting solutions.   I'm seeing this patient by the request  of:  [name], MD  CC: Back pain and knee pain follow-up  [id number]  08/09/2020 Bilateral injections given today, tolerated the procedure well.  I do believe that this is secondary to either more lumbar radiculopathy and will get CT lumbar to further evaluate the back.  Patient has history of rheumatoid arthritis, neuropathy as well as peripheral artery disease.  Laboratory work-up also ordered today to see if anything else is abnormal that could be contributing.  Follow-up with me after imaging.  History of peripheral artery disease.  Patient has good blood flow it seems like today but may need further evaluation.  Concern with patient's back.  I am concerned that patient may have some loosening of the posterior lumbar interbody fusion.  Patient is having weakness of the lower extremities.  Patient does have what appears to be more of a significant neuropathy of the lower extremities as well.  I do feel to start off with a CT scan of the back would be most beneficial.  If we do see any loosening we will need to refer to orthopedic or neurosurgery.  We did discuss food to go to but patient actually has an outstanding bill and may need to consider another physician for evaluation.  At this point we did discuss other medications including patient's gabapentin.  Patient does not want to make any other large changes.  Given greater trochanteric injections today we will see if that  makes improvement.  If continuing to have difficulty nerve conduction study also could be beneficial.  Patient will follow-up after imaging and will discuss further.  Update 01/19/2021 Sloka I Starn is a 65 y.o. female coming in with complaint of B knee and back pain. Hips injected last visit. Epidural injection on 11/22/2020. Visco injection on 05/11/2020. Patient states that she has more swelling recently.   Epidural did help alleviate some pain. Pain is slowly coming back.  Patient continues to need the aid of a cane to help her with ambulation.  Still feels like she has weakness of the lower extremities.  Continues with some back pain is noted but is a little better since the injection.     Past Medical History:  Diagnosis Date   Anemia    mild   Anxiety    Arthritis    Asthma    Cardiomyopathy ([place])    CHF (congestive heart failure) (HCC)    Diabetes mellitus without complication (HCC)    Frequency of urination    at night   GERD (gastroesophageal reflux disease)    potassium worsens reflux   Hemorrhoids, external    Hyperlipidemia    Hypertension    Neuromuscular disorder (HCC)    neuropathy   Numbness and tingling of both legs    Rheumatoid arthritis ([place])    Vertigo    hx of   Past Surgical History:  Procedure Laterality Date   ABDOMINAL HYSTERECTOMY     CARDIAC CATHETERIZATION N/A 10/28/2014   Procedure: Left Heart Cath and Coronary  Angiography;  Surgeon: Jettie Booze, MD;  Location: [facility];  Service: Cardiovascular;  Laterality: N/A;   CARPAL TUNNEL RELEASE Left    COLONOSCOPY     pt states 11 yr ago in [place] had a colon with 2 polyps- one cecal polyp per pt. one ? location   COLONOSCOPY W/ POLYPECTOMY     CYST REMOVAL NECK     HEMORROIDECTOMY     MAXIMUM ACCESS (MAS)POSTERIOR LUMBAR INTERBODY FUSION (PLIF) 3 LEVEL N/A 10/08/2013   Procedure: FOR MAXIMUM ACCESS (MAS) POSTERIOR LUMBAR INTERBODY FUSION (PLIF) 3 LEVEL;  Surgeon: Erline Levine, MD;   Location: [facility];  Service: Neurosurgery;  Laterality: N/A;  L3-4 L4-5 L5-S1 maximum access posterior lumbar interbody fusion with decompression   TUBAL LIGATION     Social History   Socioeconomic History   Marital status: Single    Spouse name: Not on file   Number of children: Not on file   Years of education: Not on file   Highest education level: Not on file  Occupational History   Not on file  Tobacco Use   Smoking status: Former   Smokeless tobacco: Never  Vaping Use   Vaping Use: Never used  Substance and Sexual Activity   Alcohol use: Yes    Comment: 3 x a year -rare    Drug use: No    Types: Marijuana   Sexual activity: Not Currently  Other Topics Concern   Not on file  Social History Narrative   Not on file   Social Determinants of Health   Financial Resource Strain: Low Risk    Difficulty of Paying Living Expenses: Not hard at all  Food Insecurity: No Food Insecurity   Worried About Charity fundraiser in the Last Year: Never true   Ran Out of Food in the Last Year: Never true  Transportation Needs: No Transportation Needs   Lack of Transportation (Medical): No   Lack of Transportation (Non-Medical): No  Physical Activity: Sufficiently Active   Days of Exercise per Week: 5 days   Minutes of Exercise per Session: 30 min  Stress: No Stress Concern Present   Feeling of Stress : Not at all  Social Connections: Moderately Integrated   Frequency of Communication with Friends and Family: More than three times a week   Frequency of Social Gatherings with Friends and Family: More than three times a week   Attends Religious Services: More than 4 times per year   Active Member of Clubs or Organizations: No   Attends Music therapist: More than 4 times per year   Marital Status: Widowed   Allergies  Allergen Reactions   Lisinopril Cough   Family History  Problem Relation Age of Onset   Early death Father    Heart disease Father     Hypertension Sister    Hypertension Brother    Diabetes Brother    Colon polyps Mother    Hashimoto's thyroiditis Sister    Colon cancer Maternal Grandmother        in her 59s   Stomach cancer Maternal Aunt    Non-Hodgkin's lymphoma Sister    Alcohol abuse Neg Hx    COPD Neg Hx    Depression Neg Hx    Drug abuse Neg Hx    Hearing loss Neg Hx    Hyperlipidemia Neg Hx    Kidney disease Neg Hx    Stroke Neg Hx    Esophageal cancer Neg Hx  Rectal cancer Neg Hx     Current Outpatient Medications (Endocrine & Metabolic):    dapagliflozin propanediol (FARXIGA) 10 MG TABS tablet, Take 1 tablet (10 mg total) by mouth daily before breakfast.  Current Outpatient Medications (Cardiovascular):    atorvastatin (LIPITOR) 40 MG tablet, Take 1 tablet by mouth once daily   carvedilol (COREG) 6.25 MG tablet, TAKE 1 TABLET BY MOUTH TWICE DAILY WITH A MEAL   ENTRESTO 97-103 MG, Take 1 tablet by mouth twice daily   spironolactone (ALDACTONE) 25 MG tablet, Take 0.5 tablets (12.5 mg total) by mouth daily.  Current Outpatient Medications (Respiratory):    PROAIR RESPICLICK 025 (90 Base) MCG/ACT AEPB, INHALE 2 PUFFS BY MOUTH 4 TIMES DAILY AS NEEDED  Current Outpatient Medications (Analgesics):    allopurinol (ZYLOPRIM) 300 MG tablet, Take 1 tablet by mouth once daily   aspirin EC 81 MG tablet, Take 1 tablet (81 mg total) by mouth daily.   leflunomide (ARAVA) 20 MG tablet, Take 1 tablet by mouth once daily   Current Outpatient Medications (Other):    Accu-Chek FastClix Lancets MISC, Inject 1 Act into the skin 3 (three) times daily.   Biotin 10 MG TABS, Take by mouth.   Blood Glucose Monitoring Suppl (ACCU-CHEK GUIDE) w/Device KIT, 1 Act by Does not apply route 3 (three) times daily.   Calcium Carb-Cholecalciferol (CALCIUM 600 + D PO), Take 1 tablet by mouth daily.   Cinnamon 500 MG TABS, Take by mouth.   citalopram (CELEXA) 20 MG tablet, Take 1 tablet by mouth once daily   DEXILANT 60 MG  capsule, Take 1 capsule by mouth once daily   gabapentin (NEURONTIN) 600 MG tablet, TAKE 1 TABLET BY MOUTH THREE TIMES DAILY   glucose blood (ACCU-CHEK GUIDE) test strip, Use TID   Lancets Misc. (ACCU-CHEK FASTCLIX LANCET) KIT, Inject 1 Act into the skin 3 (three) times daily.   Omega-3 Fatty Acids (FISH OIL) 1200 MG CAPS, Take 1,200 mg by mouth daily.    Tart Cherry 1200 MG CAPS, Take 1 capsule by mouth daily.   Turmeric 500 MG CAPS, Take 1 capsule by mouth daily.   Reviewed prior external information including notes and imaging from  primary care provider As well as notes that were available from care everywhere and other healthcare systems.  Past medical history, social, surgical and family history all reviewed in electronic medical record.  No pertanent information unless stated regarding to the chief complaint.   Review of Systems:  No headache, visual changes, nausea, vomiting, diarrhea, constipation, dizziness, abdominal pain, skin rash, fevers, chills, night sweats, weight loss, swollen lymph nodes, , joint swelling, chest pain, shortness of breath, mood changes. POSITIVE muscle aches, body aches  Objective  Blood pressure 118/66, pulse 64, height 5' 4"  (1.626 m), last menstrual period 05/29/1991, SpO2 98 %.   General: No apparent distress alert and oriented x3 mood and affect normal, dressed appropriately.  HEENT: Pupils equal, extraocular movements intact  Respiratory: Patient's speak in full sentences and does not appear short of breath  Cardiovascular: Trace lower extremity edema, non tender, no erythema  Gait severely antalgic Back exam does have significant loss of lordosis. Knees do have significant arthritic changes with instability with valgus and varus force.  Lacks last 5 degrees of extension and flexion.  After informed written and verbal consent, patient was seated on exam table. Right knee was prepped with alcohol swab and utilizing anterolateral approach, patient's  right knee space was injected with 4:1  marcaine 0.5%:  Kenalog 47m/dL. Patient tolerated the procedure well without immediate complications.  After informed written and verbal consent, patient was seated on exam table. Left knee was prepped with alcohol swab and utilizing anterolateral approach, patient's left knee space was injected with 4:1  marcaine 0.5%: Kenalog 469mdL. Patient tolerated the procedure well without immediate complications.    Impression and Recommendations:     The above documentation has been reviewed and is accurate and complete ZaLyndal PulleyDO

## 2021-01-19 NOTE — Assessment & Plan Note (Signed)
Significant spinal stenosis.  Patient has had progression.  Has responded well to the epidural.  Discussed with patient to continue to stay active otherwise.  Can consider repeat epidural if necessary.  Patient is concerned for any type of surgery that unfortunately with her ejection fraction being lower this would be too much stress on her heart.  Would need approval by other physicians if we did go this route.  Follow-up with me again in 6 to 8 weeks.

## 2021-01-23 ENCOUNTER — Other Ambulatory Visit: Payer: Self-pay | Admitting: Internal Medicine

## 2021-01-23 DIAGNOSIS — M069 Rheumatoid arthritis, unspecified: Secondary | ICD-10-CM

## 2021-02-08 ENCOUNTER — Other Ambulatory Visit: Payer: Self-pay | Admitting: Internal Medicine

## 2021-02-14 ENCOUNTER — Encounter: Payer: Self-pay | Admitting: Internal Medicine

## 2021-02-14 ENCOUNTER — Other Ambulatory Visit: Payer: Self-pay

## 2021-02-14 ENCOUNTER — Ambulatory Visit (INDEPENDENT_AMBULATORY_CARE_PROVIDER_SITE_OTHER): Payer: Medicare Other | Admitting: Internal Medicine

## 2021-02-14 VITALS — BP 128/78 | HR 70 | Temp 98.3°F | Resp 16 | Ht 64.0 in | Wt 221.0 lb

## 2021-02-14 DIAGNOSIS — D510 Vitamin B12 deficiency anemia due to intrinsic factor deficiency: Secondary | ICD-10-CM | POA: Diagnosis not present

## 2021-02-14 DIAGNOSIS — E785 Hyperlipidemia, unspecified: Secondary | ICD-10-CM | POA: Diagnosis not present

## 2021-02-14 DIAGNOSIS — E118 Type 2 diabetes mellitus with unspecified complications: Secondary | ICD-10-CM | POA: Diagnosis not present

## 2021-02-14 DIAGNOSIS — N1831 Chronic kidney disease, stage 3a: Secondary | ICD-10-CM | POA: Diagnosis not present

## 2021-02-14 DIAGNOSIS — I10 Essential (primary) hypertension: Secondary | ICD-10-CM | POA: Diagnosis not present

## 2021-02-14 DIAGNOSIS — G252 Other specified forms of tremor: Secondary | ICD-10-CM | POA: Insufficient documentation

## 2021-02-14 DIAGNOSIS — Z23 Encounter for immunization: Secondary | ICD-10-CM | POA: Diagnosis not present

## 2021-02-14 LAB — CBC WITH DIFFERENTIAL/PLATELET
Basophils Absolute: 0 10*3/uL (ref 0.0–0.1)
Basophils Relative: 0.5 % (ref 0.0–3.0)
Eosinophils Absolute: 0.1 10*3/uL (ref 0.0–0.7)
Eosinophils Relative: 3.5 % (ref 0.0–5.0)
HCT: 33.9 % — ABNORMAL LOW (ref 36.0–46.0)
Hemoglobin: 11 g/dL — ABNORMAL LOW (ref 12.0–15.0)
Lymphocytes Relative: 44.5 % (ref 12.0–46.0)
Lymphs Abs: 1.5 10*3/uL (ref 0.7–4.0)
MCHC: 32.4 g/dL (ref 30.0–36.0)
MCV: 97.9 fl (ref 78.0–100.0)
Monocytes Absolute: 0.4 10*3/uL (ref 0.1–1.0)
Monocytes Relative: 12.8 % — ABNORMAL HIGH (ref 3.0–12.0)
Neutro Abs: 1.3 10*3/uL — ABNORMAL LOW (ref 1.4–7.7)
Neutrophils Relative %: 38.7 % — ABNORMAL LOW (ref 43.0–77.0)
Platelets: 223 10*3/uL (ref 150.0–400.0)
RBC: 3.47 Mil/uL — ABNORMAL LOW (ref 3.87–5.11)
RDW: 15.2 % (ref 11.5–15.5)
WBC: 3.3 10*3/uL — ABNORMAL LOW (ref 4.0–10.5)

## 2021-02-14 LAB — BASIC METABOLIC PANEL
BUN: 21 mg/dL (ref 6–23)
CO2: 25 mEq/L (ref 19–32)
Calcium: 9.1 mg/dL (ref 8.4–10.5)
Chloride: 107 mEq/L (ref 96–112)
Creatinine, Ser: 1.24 mg/dL — ABNORMAL HIGH (ref 0.40–1.20)
GFR: 45.21 mL/min — ABNORMAL LOW (ref >60.00)
Glucose, Bld: 135 mg/dL — ABNORMAL HIGH (ref 70–99)
Potassium: 4.3 mEq/L (ref 3.5–5.1)
Sodium: 140 mEq/L (ref 135–145)

## 2021-02-14 LAB — URINALYSIS, ROUTINE W REFLEX MICROSCOPIC
Bilirubin Urine: NEGATIVE
Hgb urine dipstick: NEGATIVE
Ketones, ur: NEGATIVE
Leukocytes,Ua: NEGATIVE
Nitrite: NEGATIVE
RBC / HPF: NONE SEEN (ref 0–?)
Specific Gravity, Urine: 1.02 (ref 1.000–1.030)
Total Protein, Urine: NEGATIVE
Urine Glucose: >=1000 — AB
Urobilinogen, UA: 0.2 (ref 0.0–1.0)
pH: 5.5 (ref 5.0–8.0)

## 2021-02-14 LAB — LIPID PANEL
Cholesterol: 156 mg/dL (ref 0–200)
HDL: 48.9 mg/dL (ref >39.00)
LDL Cholesterol: 87 mg/dL (ref 0–99)
NonHDL: 107.11
Total CHOL/HDL Ratio: 3
Triglycerides: 101 mg/dL (ref 0.0–149.0)
VLDL: 20.2 mg/dL (ref 0.0–40.0)

## 2021-02-14 LAB — HEMOGLOBIN A1C: Hgb A1c MFr Bld: 7 % — ABNORMAL HIGH (ref 4.6–6.5)

## 2021-02-14 MED ORDER — TIRZEPATIDE 2.5 MG/0.5ML ~~LOC~~ SOAJ: 2.5000 mg | SUBCUTANEOUS | 0 refills | Status: DC

## 2021-02-14 MED ORDER — DAPAGLIFLOZIN PROPANEDIOL 10 MG PO TABS: 10.0000 mg | ORAL_TABLET | Freq: Every day | ORAL | 1 refills | Status: DC

## 2021-02-14 NOTE — Patient Instructions (Signed)
Anemia Anemia is a condition in which there is not enough red blood cells or hemoglobin in the blood. Hemoglobin is a substance in red blood cells that carries oxygen. When you do not have enough red blood cells or hemoglobin (are anemic), your body cannot get enough oxygen and your organs may not work properly. As a result, you may feel very tired or have other problems. What are the causes? Common causes of anemia include: Excessive bleeding. Anemia can be caused by excessive bleeding inside or outside the body, including bleeding from the intestines or from heavy menstrual periods in females. Poor nutrition. Long-lasting (chronic) kidney, thyroid, and liver disease. Bone marrow disorders, spleen problems, and blood disorders. Cancer and treatments for cancer. HIV (human immunodeficiency virus) and AIDS (acquired immunodeficiency syndrome). Infections, medicines, and autoimmune disorders that destroy red blood cells. What are the signs or symptoms? Symptoms of this condition include: Minor weakness. Dizziness. Headache, or difficulties concentrating and sleeping. Heartbeats that feel irregular or faster than normal (palpitations). Shortness of breath, especially with exercise. Pale skin, lips, and nails, or cold hands and feet. Indigestion and nausea. Symptoms may occur suddenly or develop slowly. If your anemia is mild, you may not have symptoms. How is this diagnosed? This condition is diagnosed based on blood tests, your medical history, and a physical exam. In some cases, a test may be needed in which cells are removed from the soft tissue inside of a bone and looked at under a microscope (bone marrow biopsy). Your health care provider may also check your stool (feces) for blood and may do additional testing to look for the cause of your bleeding. Other tests may include: Imaging tests, such as a CT scan or MRI. A procedure to see inside your esophagus and stomach (endoscopy). A  procedure to see inside your colon and rectum (colonoscopy). How is this treated? Treatment for this condition depends on the cause. If you continue to lose a lot of blood, you may need to be treated at a hospital. Treatment may include: Taking supplements of iron, vitamin B12, or folic acid. Taking a hormone medicine (erythropoietin) that can help to stimulate red blood cell growth. Having a blood transfusion. This may be needed if you lose a lot of blood. Making changes to your diet. Having surgery to remove your spleen. Follow these instructions at home: Take over-the-counter and prescription medicines only as told by your health care provider. Take supplements only as told by your health care provider. Follow any diet instructions that you were given by your health care provider. Keep all follow-up visits as told by your health care provider. This is important. Contact a health care provider if: You develop new bleeding anywhere in the body. Get help right away if: You are very weak. You are short of breath. You have pain in your abdomen or chest. You are dizzy or feel faint. You have trouble concentrating. You have bloody stools, black stools, or tarry stools. You vomit repeatedly or you vomit up blood. These symptoms may represent a serious problem that is an emergency. Do not wait to see if the symptoms will go away. Get medical help right away. Call your local emergency services (911 in the [place] Do not drive yourself to the hospital. Summary Anemia is a condition in which you do not have enough red blood cells or enough of a substance in your red blood cells that carries oxygen (hemoglobin). Symptoms may occur suddenly or develop slowly. If your anemia is   mild, you may not have symptoms. This condition is diagnosed with blood tests, a medical history, and a physical exam. Other tests may be needed. Treatment for this condition depends on the cause of the anemia. This  information is not intended to replace advice given to you by your health care provider. Make sure you discuss any questions you have with your health care provider. Document Revised: 04/21/2019 Document Reviewed: 04/21/2019 Elsevier Patient Education  2022 Elsevier Inc.  

## 2021-02-14 NOTE — Progress Notes (Signed)
Subjective:  Patient ID: Emily Wheeler, female    DOB: [birth year]  Age: 65 y.o. MRN: [id number]  CC: Anemia, Hyperlipidemia, and Hypertension  This visit occurred during the SARS-CoV-2 public health emergency.  Safety protocols were in place, including screening questions prior to the visit, additional usage of staff PPE, and extensive cleaning of exam room while observing appropriate contact time as indicated for disinfecting solutions.    HPI Emily Wheeler presents for f/up -   For the last year she has noticed shaking in both hands when she tries to just pick up small objects.  She tells me her sister has also noticed it.  She does not noticed a tremor at rest or in her head.  She has no trouble with her gait.  She complains of chronic musculoskeletal pain.  She is active and denies CP, DOE, palpitations, edema, or diaphoresis.  Outpatient Medications Prior to Visit  Medication Sig Dispense Refill   Accu-Chek FastClix Lancets MISC Inject 1 Act into the skin 3 (three) times daily. 306 each 1   allopurinol (ZYLOPRIM) 300 MG tablet Take 1 tablet by mouth once daily 90 tablet 0   aspirin EC 81 MG tablet Take 1 tablet (81 mg total) by mouth daily. 90 tablet 3   Biotin 10 MG TABS Take by mouth.     Blood Glucose Monitoring Suppl (ACCU-CHEK GUIDE) w/Device KIT 1 Act by Does not apply route 3 (three) times daily. 2 kit 0   Calcium Carb-Cholecalciferol (CALCIUM 600 + D PO) Take 1 tablet by mouth daily.     carvedilol (COREG) 6.25 MG tablet TAKE 1 TABLET BY MOUTH TWICE DAILY WITH A MEAL 180 tablet 0   Cinnamon 500 MG TABS Take by mouth.     citalopram (CELEXA) 20 MG tablet Take 1 tablet by mouth once daily 90 tablet 0   DEXILANT 60 MG capsule Take 1 capsule by mouth once daily 90 capsule 0   ENTRESTO 97-103 MG Take 1 tablet by mouth twice daily 180 tablet 3   gabapentin (NEURONTIN) 600 MG tablet TAKE 1 TABLET BY MOUTH THREE TIMES DAILY 270 tablet 0   glucose blood (ACCU-CHEK GUIDE) test  strip Use TID 100 each 12   Lancets Misc. (ACCU-CHEK FASTCLIX LANCET) KIT Inject 1 Act into the skin 3 (three) times daily. 306 kit 1   leflunomide (ARAVA) 20 MG tablet Take 1 tablet by mouth once daily 90 tablet 0   Omega-3 Fatty Acids (FISH OIL) 1200 MG CAPS Take 1,200 mg by mouth daily.      PROAIR RESPICLICK 818 (90 Base) MCG/ACT AEPB INHALE 2 PUFFS BY MOUTH 4 TIMES DAILY AS NEEDED 1 each 3   spironolactone (ALDACTONE) 25 MG tablet Take 0.5 tablets (12.5 mg total) by mouth daily. 90 tablet 3   Tart Cherry 1200 MG CAPS Take 1 capsule by mouth daily.     Turmeric 500 MG CAPS Take 1 capsule by mouth daily.     atorvastatin (LIPITOR) 40 MG tablet Take 1 tablet by mouth once daily 90 tablet 0   dapagliflozin propanediol (FARXIGA) 10 MG TABS tablet Take 1 tablet (10 mg total) by mouth daily before breakfast. 30 tablet 12   No facility-administered medications prior to visit.    ROS Review of Systems  Constitutional:  Negative for diaphoresis and fatigue.  HENT: Negative.    Eyes: Negative.   Cardiovascular:  Negative for chest pain, palpitations and leg swelling.  Gastrointestinal: Negative.  Negative for abdominal pain,  constipation, diarrhea, nausea and vomiting.  Endocrine: Negative.   Genitourinary: Negative.  Negative for difficulty urinating and dysuria.  Musculoskeletal:  Positive for arthralgias and back pain. Negative for myalgias and neck pain.  Skin: Negative.  Negative for color change.  Neurological:  Positive for tremors. Negative for dizziness, weakness and light-headedness.  Hematological:  Negative for adenopathy. Does not bruise/bleed easily.  Psychiatric/Behavioral: Negative.     Objective:  BP 128/78 (BP Location: Left Arm, Patient Position: Sitting, Cuff Size: Large)   Pulse 70   Temp 98.3 F (36.8 C) (Oral)   Resp 16   Ht 5' 4"  (1.626 m)   Wt 221 lb (100.2 kg)   LMP 05/29/1991   SpO2 96%   BMI 37.93 kg/m   BP Readings from Last 3 Encounters:  02/14/21  128/78  01/19/21 118/66  11/07/20 (!) 179/98    Wt Readings from Last 3 Encounters:  02/14/21 221 lb (100.2 kg)  08/24/20 211 lb (95.7 kg)  08/09/20 213 lb (96.6 kg)    Physical Exam Vitals reviewed.  Constitutional:      Appearance: She is obese.  HENT:     Mouth/Throat:     Mouth: Mucous membranes are moist.  Eyes:     Conjunctiva/sclera: Conjunctivae normal.  Cardiovascular:     Rate and Rhythm: Normal rate and regular rhythm.     Heart sounds: No murmur heard. Pulmonary:     Effort: Pulmonary effort is normal.     Breath sounds: No stridor. No wheezing, rhonchi or rales.  Abdominal:     General: Abdomen is protuberant. Bowel sounds are normal. There is no distension.     Palpations: Abdomen is soft. There is no hepatomegaly, splenomegaly or mass.     Tenderness: There is no abdominal tenderness.  Musculoskeletal:        General: Normal range of motion.     Cervical back: Neck supple.     Right lower leg: No edema.     Left lower leg: No edema.  Lymphadenopathy:     Cervical: No cervical adenopathy.  Skin:    General: Skin is warm and dry.  Neurological:     General: No focal deficit present.     Mental Status: She is alert.  Psychiatric:        Mood and Affect: Mood normal.    Lab Results  Component Value Date   WBC 3.3 (L) 02/14/2021   HGB 11.0 (L) 02/14/2021   HCT 33.9 (L) 02/14/2021   PLT 223.0 02/14/2021   GLUCOSE 135 (H) 02/14/2021   CHOL 156 02/14/2021   TRIG 101.0 02/14/2021   HDL 48.90 02/14/2021   LDLDIRECT 162.5 01/30/2013   LDLCALC 87 02/14/2021   ALT 13 08/09/2020   AST 15 08/09/2020   NA 140 02/14/2021   K 4.3 02/14/2021   CL 107 02/14/2021   CREATININE 1.24 (H) 02/14/2021   BUN 21 02/14/2021   CO2 25 02/14/2021   TSH 3.17 08/09/2020   INR 0.9 08/19/2018   HGBA1C 7.0 (H) 02/14/2021   MICROALBUR 1.5 08/24/2020    US RENAL  Result Date: 11/14/2020 CLINICAL DATA:  Stage 3 chronic kidney disease EXAM: RENAL / URINARY TRACT  ULTRASOUND COMPLETE COMPARISON:  Ultrasound 03/07/2018 FINDINGS: Right Kidney: Renal measurements: 10 x 4.3 x 4 cm = volume: 90 mL. Echogenicity within normal limits. No mass or hydronephrosis visualized. Left Kidney: Renal measurements: 10.3 x 4.2 x 5.8 cm = volume: 131 mL. Echogenicity within normal limits. No mass or  hydronephrosis visualized. Bladder: Appears normal for degree of bladder distention. Other: None. IMPRESSION: Negative renal ultrasound Electronically Signed   By: Donavan Foil M.D.   On: 11/14/2020 23:11    Assessment & Plan:   Larosa was seen today for anemia, hyperlipidemia and hypertension.  Diagnoses and all orders for this visit:  Essential hypertension, benign- Her blood pressure is adequately well controlled. -     CBC with Differential/Platelet; Future -     Basic metabolic panel; Future -     Urinalysis, Routine w reflex microscopic; Future -     Urinalysis, Routine w reflex microscopic -     Basic metabolic panel -     CBC with Differential/Platelet  Type II diabetes mellitus with manifestations ([place])- Her A1c is up to 7%.  I recommended that she start a GLP/GIP agonist. -     Hemoglobin A1c; Future -     Basic metabolic panel; Future -     Basic metabolic panel -     Hemoglobin A1c -     dapagliflozin propanediol (FARXIGA) 10 MG TABS tablet; Take 1 tablet (10 mg total) by mouth daily before breakfast. -     tirzepatide (MOUNJARO) 2.5 MG/0.5ML Pen; Inject 2.5 mg into the skin once a week.  Stage 3a chronic kidney disease ([place])- Will continue Farxiga and try to get better control of her blood sugar. -     Basic metabolic panel; Future -     Urinalysis, Routine w reflex microscopic; Future -     Urinalysis, Routine w reflex microscopic -     Basic metabolic panel  Hyperlipidemia with target LDL less than 100- LDL goal achieved. Doing well on the statin  -     Lipid panel; Future -     Lipid panel  Vitamin B12 deficiency anemia due to intrinsic factor  deficiency- Her H&H have improved. -     CBC with Differential/Platelet; Future -     CBC with Differential/Platelet  Flu vaccine need -     Flu Vaccine QUAD High Dose(Fluad)  Coarse tremors -     Ambulatory referral to Neurology  I am having Shara I. Netter start on tirzepatide. I am also having her maintain her Fish Oil, aspirin EC, Calcium Carb-Cholecalciferol (CALCIUM 600 + D PO), Accu-Chek Guide, Accu-Chek FastClix Lancets, Accu-Chek [employer], Accu-Chek Guide, Entresto, Turmeric, Tart [place], ProAir RespiClick, spironolactone, Cinnamon, gabapentin, allopurinol, Dexilant, carvedilol, Biotin, leflunomide, citalopram, and dapagliflozin propanediol.  Meds ordered this encounter  Medications   dapagliflozin propanediol (FARXIGA) 10 MG TABS tablet    Sig: Take 1 tablet (10 mg total) by mouth daily before breakfast.    Dispense:  90 tablet    Refill:  1   tirzepatide (MOUNJARO) 2.5 MG/0.5ML Pen    Sig: Inject 2.5 mg into the skin once a week.    Dispense:  2 mL    Refill:  0      Follow-up: Return in about 6 months (around 08/14/2021).  Scarlette Calico, MD

## 2021-02-16 ENCOUNTER — Other Ambulatory Visit: Payer: Self-pay | Admitting: Internal Medicine

## 2021-02-24 ENCOUNTER — Other Ambulatory Visit: Payer: Self-pay | Admitting: Internal Medicine

## 2021-02-24 DIAGNOSIS — E118 Type 2 diabetes mellitus with unspecified complications: Secondary | ICD-10-CM

## 2021-02-24 MED ORDER — TRULICITY 0.75 MG/0.5ML ~~LOC~~ SOAJ: 0.7500 mg | SUBCUTANEOUS | 0 refills | Status: DC

## 2021-03-01 ENCOUNTER — Other Ambulatory Visit: Payer: Self-pay | Admitting: Family Medicine

## 2021-03-01 ENCOUNTER — Other Ambulatory Visit: Payer: Self-pay | Admitting: Internal Medicine

## 2021-03-01 NOTE — Progress Notes (Signed)
[facility] [place] [facility] Phone: [phone] Subjective:   Emily Wheeler, am serving as a scribe for Dr. Hulan Saas.  This visit occurred during the SARS-CoV-2 public health emergency.  Safety protocols were in place, including screening questions prior to the visit, additional usage of staff PPE, and extensive cleaning of exam room while observing appropriate contact time as indicated for disinfecting solutions.   I'm seeing this patient by the request  of:  [name], MD  CC: Low back pain bilateral knee pain  TLX:[id number]  01/19/2021 Significant spinal stenosis.  Patient has had progression.  Has responded well to the epidural.  Discussed with patient to continue to stay active otherwise.  Can consider repeat epidural if necessary.  Patient is concerned for any type of surgery that unfortunately with her ejection fraction being lower this would be too much stress on her heart.  Would need approval by other physicians if we did go this route.  Follow-up with me again in 6 to 8 weeks.  Bilateral injection given today.  Tolerated the procedure well, discussed icing regimen and home exercises, discussed avoiding certain activities.  We discussed icing regimen.  Follow-up again 6 weeks.  Patient did respond well to the viscosupplementation may need to consider that again.  Updated 03/02/2021 Emily Wheeler is a 65 y.o. female coming in with complaint of LBP and bilateral knee pain. Patient having in R knee instability that seems to be getting worse. Notable swelling in posterior aspect that has changed within the last year. States that symptoms in R knee are different from normal arthritis pain that she has in both knees.   Epidural 11/07/2020 is starting to wear off.  More pain down the legs, more pain at night      Past Medical History:  Diagnosis Date   Anemia    mild   Anxiety    Arthritis    Asthma     Cardiomyopathy ([facility])    CHF (congestive heart failure) ([facility])    Diabetes mellitus without complication ([facility])    Frequency of urination    at night   GERD (gastroesophageal reflux disease)    potassium worsens reflux   Hemorrhoids, external    Hyperlipidemia    Hypertension    Neuromuscular disorder ([facility])    neuropathy   Numbness and tingling of both legs    Rheumatoid arthritis ([place])    Vertigo    hx of   Past Surgical History:  Procedure Laterality Date   ABDOMINAL HYSTERECTOMY     CARDIAC CATHETERIZATION N/A 10/28/2014   Procedure: Left Heart Cath and Coronary Angiography;  Surgeon: Jettie Booze, MD;  Location: [facility];  Service: Cardiovascular;  Laterality: N/A;   CARPAL TUNNEL RELEASE Left    COLONOSCOPY     pt states 11 yr ago in [place] had a colon with 2 polyps- one cecal polyp per pt. one ? location   COLONOSCOPY W/ POLYPECTOMY     CYST REMOVAL NECK     HEMORROIDECTOMY     MAXIMUM ACCESS (MAS)POSTERIOR LUMBAR INTERBODY FUSION (PLIF) 3 LEVEL N/A 10/08/2013   Procedure: FOR MAXIMUM ACCESS (MAS) POSTERIOR LUMBAR INTERBODY FUSION (PLIF) 3 LEVEL;  Surgeon: Erline Levine, MD;  Location: [facility];  Service: Neurosurgery;  Laterality: N/A;  L3-4 L4-5 L5-S1 maximum access posterior lumbar interbody fusion with decompression   TUBAL LIGATION     Social History   Socioeconomic History  Marital status: Single    Spouse name: Not on file   Number of children: Not on file   Years of education: Not on file   Highest education level: Not on file  Occupational History   Not on file  Tobacco Use   Smoking status: Former   Smokeless tobacco: Never  Vaping Use   Vaping Use: Never used  Substance and Sexual Activity   Alcohol use: Yes    Comment: 3 x a year -rare    Drug use: Wheeler    Types: Marijuana   Sexual activity: Not Currently  Other Topics Concern   Not on file  Social History Narrative   Not on file   Social Determinants of Health   Financial  Resource Strain: Low Risk    Difficulty of Paying Living Expenses: Not hard at all  Food Insecurity: Wheeler Food Insecurity   Worried About Charity fundraiser in the Last Year: Never true   [place] in the Last Year: Never true  Transportation Needs: Wheeler Transportation Needs   Lack of Transportation (Medical): Wheeler   Lack of Transportation (Non-Medical): Wheeler  Physical Activity: Sufficiently Active   Days of Exercise per Week: 5 days   Minutes of Exercise per Session: 30 min  Stress: Wheeler Stress Concern Present   Feeling of Stress : Not at all  Social Connections: Moderately Integrated   Frequency of Communication with Friends and Family: More than three times a week   Frequency of Social Gatherings with Friends and Family: More than three times a week   Attends Religious Services: More than 4 times per year   Active Member of Clubs or Organizations: Wheeler   Attends Music therapist: More than 4 times per year   Marital Status: Widowed   Allergies  Allergen Reactions   Lisinopril Cough   Family History  Problem Relation Age of Onset   Early death Father    Heart disease Father    Hypertension Sister    Hypertension Brother    Diabetes Brother    Colon polyps Mother    Hashimoto's thyroiditis Sister    Colon cancer Maternal Grandmother        in her 64s   Stomach cancer Maternal Aunt    Non-Hodgkin's lymphoma Sister    Alcohol abuse Neg Hx    COPD Neg Hx    Depression Neg Hx    Drug abuse Neg Hx    Hearing loss Neg Hx    Hyperlipidemia Neg Hx    Kidney disease Neg Hx    Stroke Neg Hx    Esophageal cancer Neg Hx    Rectal cancer Neg Hx     Current Outpatient Medications (Endocrine & Metabolic):    dapagliflozin propanediol (FARXIGA) 10 MG TABS tablet, Take 1 tablet (10 mg total) by mouth daily before breakfast.   Dulaglutide (TRULICITY) 2.35 TD/3.2KG SOPN, Inject 0.75 mg into the skin once a week.  Current Outpatient Medications (Cardiovascular):     atorvastatin (LIPITOR) 40 MG tablet, Take 1 tablet by mouth once daily   carvedilol (COREG) 6.25 MG tablet, TAKE 1 TABLET BY MOUTH TWICE DAILY WITH A MEAL   ENTRESTO 97-103 MG, Take 1 tablet by mouth twice daily   spironolactone (ALDACTONE) 25 MG tablet, Take 0.5 tablets (12.5 mg total) by mouth daily.  Current Outpatient Medications (Respiratory):    PROAIR RESPICLICK 254 (90 Base) MCG/ACT AEPB, INHALE 2 PUFFS BY MOUTH 4 TIMES DAILY AS NEEDED  Current Outpatient Medications (Analgesics):    allopurinol (ZYLOPRIM) 300 MG tablet, Take 1 tablet by mouth once daily   aspirin EC 81 MG tablet, Take 1 tablet (81 mg total) by mouth daily.   leflunomide (ARAVA) 20 MG tablet, Take 1 tablet by mouth once daily   Current Outpatient Medications (Other):    Accu-Chek FastClix Lancets MISC, Inject 1 Act into the skin 3 (three) times daily.   Biotin 10 MG TABS, Take by mouth.   Blood Glucose Monitoring Suppl (ACCU-CHEK GUIDE) w/Device KIT, 1 Act by Does not apply route 3 (three) times daily.   Calcium Carb-Cholecalciferol (CALCIUM 600 + D PO), Take 1 tablet by mouth daily.   Cinnamon 500 MG TABS, Take by mouth.   citalopram (CELEXA) 20 MG tablet, Take 1 tablet by mouth once daily   DEXILANT 60 MG capsule, Take 1 capsule by mouth once daily   gabapentin (NEURONTIN) 600 MG tablet, TAKE 1 TABLET BY MOUTH THREE TIMES DAILY   glucose blood (ACCU-CHEK GUIDE) test strip, Use TID   Lancets Misc. (ACCU-CHEK FASTCLIX LANCET) KIT, Inject 1 Act into the skin 3 (three) times daily.   Omega-3 Fatty Acids (FISH OIL) 1200 MG CAPS, Take 1,200 mg by mouth daily.    Tart Cherry 1200 MG CAPS, Take 1 capsule by mouth daily.   Turmeric 500 MG CAPS, Take 1 capsule by mouth daily.   Reviewed prior external information including notes and imaging from  primary care provider As well as notes that were available from care everywhere and other healthcare systems.  Past medical history, social, surgical and family history all  reviewed in electronic medical record.  Wheeler pertanent information unless stated regarding to the chief complaint.   Review of Systems:  Wheeler headache, visual changes, nausea, vomiting, diarrhea, constipation, dizziness, abdominal pain, skin rash, fevers, chills, night sweats, weight loss, swollen lymph nodes,  chest pain, shortness of breath, mood changes. POSITIVE muscle aches, body aches, joint swelling  Objective  Blood pressure 110/70, pulse 73, height 5' 4"  (1.626 m), weight 223 lb (101.2 kg), last menstrual period 05/29/1991, SpO2 96 %.   General: Wheeler apparent distress alert and oriented x3 mood and affect normal, dressed appropriately.  HEENT: Pupils equal, extraocular movements intact  Respiratory: Patient's speak in full sentences and does not appear short of breath  Cardiovascular: Wheeler lower extremity edema, non tender, Wheeler erythema  Gait severely antalgic MSK: Patient's low back does have loss of lordosis.  Tender to palpation of paraspinal musculature.  Does have weakness noted with hip flexion and does have some mild atrophy of the lower extremity musculature. Patient currently does have an area that does have a mild soft tissue abnormality noted that is movable on the posterior lateral aspect.  Mild fluctuation.  Wheeler overlying cellulitis noted. Patient does have the arthritic changes of the knees especially of the medial compartment bilaterally right greater than left with instability noted with valgus and varus force  Limited muscular skeletal ultrasound was performed and interpreted by Hulan Saas, M  Limited ultrasound of patient's right knee on the posterior aspect shows that there is Wheeler true mass appreciated.  Does have the calcific finding that it could be consistent with the possibility of a calcific vein.  Does have significant overlying superficial varicose veins noted. Impression: Wheeler mass but potential calcific change although superficial vein    Impression and  Recommendations:     The above documentation has been reviewed and is accurate and complete Lyndal Pulley,  DO

## 2021-03-02 ENCOUNTER — Ambulatory Visit: Payer: Self-pay

## 2021-03-02 ENCOUNTER — Other Ambulatory Visit: Payer: Self-pay

## 2021-03-02 ENCOUNTER — Ambulatory Visit (INDEPENDENT_AMBULATORY_CARE_PROVIDER_SITE_OTHER): Payer: Medicare Other | Admitting: Family Medicine

## 2021-03-02 ENCOUNTER — Ambulatory Visit (INDEPENDENT_AMBULATORY_CARE_PROVIDER_SITE_OTHER): Payer: Medicare Other

## 2021-03-02 VITALS — BP 110/70 | HR 73 | Ht 64.0 in | Wt 223.0 lb

## 2021-03-02 DIAGNOSIS — M069 Rheumatoid arthritis, unspecified: Secondary | ICD-10-CM

## 2021-03-02 DIAGNOSIS — M25561 Pain in right knee: Secondary | ICD-10-CM

## 2021-03-02 DIAGNOSIS — M170 Bilateral primary osteoarthritis of knee: Secondary | ICD-10-CM | POA: Diagnosis not present

## 2021-03-02 DIAGNOSIS — M4316 Spondylolisthesis, lumbar region: Secondary | ICD-10-CM

## 2021-03-02 DIAGNOSIS — M5416 Radiculopathy, lumbar region: Secondary | ICD-10-CM

## 2021-03-02 DIAGNOSIS — M2550 Pain in unspecified joint: Secondary | ICD-10-CM

## 2021-03-02 DIAGNOSIS — M48062 Spinal stenosis, lumbar region with neurogenic claudication: Secondary | ICD-10-CM

## 2021-03-02 LAB — COMPREHENSIVE METABOLIC PANEL
ALT: 17 U/L (ref 0–35)
AST: 20 U/L (ref 0–37)
Albumin: 4.1 g/dL (ref 3.5–5.2)
Alkaline Phosphatase: 66 U/L (ref 39–117)
BUN: 23 mg/dL (ref 6–23)
CO2: 27 mEq/L (ref 19–32)
Calcium: 9.3 mg/dL (ref 8.4–10.5)
Chloride: 106 mEq/L (ref 96–112)
Creatinine, Ser: 1.28 mg/dL — ABNORMAL HIGH (ref 0.40–1.20)
GFR: 43.5 mL/min — ABNORMAL LOW (ref >60.00)
Glucose, Bld: 94 mg/dL (ref 70–99)
Potassium: 4 mEq/L (ref 3.5–5.1)
Sodium: 139 mEq/L (ref 135–145)
Total Bilirubin: 0.5 mg/dL (ref 0.2–1.2)
Total Protein: 6.6 g/dL (ref 6.0–8.3)

## 2021-03-02 LAB — CBC WITH DIFFERENTIAL/PLATELET
Basophils Absolute: 0 10*3/uL (ref 0.0–0.1)
Basophils Relative: 0.6 % (ref 0.0–3.0)
Eosinophils Absolute: 0.1 10*3/uL (ref 0.0–0.7)
Eosinophils Relative: 1.8 % (ref 0.0–5.0)
HCT: 33.9 % — ABNORMAL LOW (ref 36.0–46.0)
Hemoglobin: 10.9 g/dL — ABNORMAL LOW (ref 12.0–15.0)
Lymphocytes Relative: 47.1 % — ABNORMAL HIGH (ref 12.0–46.0)
Lymphs Abs: 1.8 10*3/uL (ref 0.7–4.0)
MCHC: 32.1 g/dL (ref 30.0–36.0)
MCV: 99.3 fl (ref 78.0–100.0)
Monocytes Absolute: 0.5 10*3/uL (ref 0.1–1.0)
Monocytes Relative: 12.6 % — ABNORMAL HIGH (ref 3.0–12.0)
Neutro Abs: 1.4 10*3/uL (ref 1.4–7.7)
Neutrophils Relative %: 37.9 % — ABNORMAL LOW (ref 43.0–77.0)
Platelets: 239 10*3/uL (ref 150.0–400.0)
RBC: 3.41 Mil/uL — ABNORMAL LOW (ref 3.87–5.11)
RDW: 15.9 % — ABNORMAL HIGH (ref 11.5–15.5)
WBC: 3.8 10*3/uL — ABNORMAL LOW (ref 4.0–10.5)

## 2021-03-02 NOTE — Assessment & Plan Note (Signed)
Patient does have advanced arthritic changes.  Is having more of a bulging in the popliteal area on the lateral aspect of the knee.  On ultrasound though no true mass appreciated but does seem to have what appears to be a calcific vein noted.  Patient states that its been there for years and this is slowly getting a little bit larger and giving her some trouble.  D-dimer ordered today.  Patient declined the Doppler.  Depending on the findings we will discuss needing the ultrasound if it is elevated.  Do not see anything that is significant for a mass or abnormal blood flow otherwise.  We will get independent x-rays of the right knee as well today.  Patient wants to hold on the viscosupplementation but will follow up with me again in 4 weeks for further evaluation.  Did discuss with patient's that if any worsening tenderness seek medical attention immediately.

## 2021-03-02 NOTE — Assessment & Plan Note (Signed)
Significant spondylolisthesis and has had spinal stenosis and nerve root.  Has responded well to nerve root injections and is having worsening pain again.  Patient will follow up with me again 4 weeks after the epidural.

## 2021-03-02 NOTE — Patient Instructions (Addendum)
Xray R knee Labs today  See me again in 4 weeks to check size of vein and possible knee injections

## 2021-03-02 NOTE — Assessment & Plan Note (Signed)
With patient's autoimmune we will get laboratory work-up to rule out a potential clot.  Elevated D-dimer we will get the ultrasound.  Patient knows if worsening symptoms of shortness of breath seek medical attention immediately.

## 2021-03-03 ENCOUNTER — Ambulatory Visit (HOSPITAL_COMMUNITY)
Admission: RE | Admit: 2021-03-03 | Discharge: 2021-03-03 | Disposition: A | Discharge status: Home / Self Care | Payer: Medicare Other | Source: Ambulatory Visit | Attending: Cardiovascular Disease | Admitting: Cardiovascular Disease

## 2021-03-03 ENCOUNTER — Other Ambulatory Visit: Payer: Self-pay

## 2021-03-03 DIAGNOSIS — M25561 Pain in right knee: Secondary | ICD-10-CM

## 2021-03-03 LAB — D-DIMER, QUANTITATIVE: D-Dimer, Quant: 3.29 mcg/mL FEU — ABNORMAL HIGH (ref <0.50)

## 2021-03-08 ENCOUNTER — Other Ambulatory Visit: Payer: Medicare Other

## 2021-03-09 ENCOUNTER — Ambulatory Visit: Payer: Medicare Other | Admitting: Internal Medicine

## 2021-03-28 ENCOUNTER — Ambulatory Visit
Admission: RE | Admit: 2021-03-28 | Discharge: 2021-03-28 | Disposition: A | Discharge status: Home / Self Care | Payer: Medicare Other | Source: Ambulatory Visit | Attending: Family Medicine | Admitting: Family Medicine

## 2021-03-28 ENCOUNTER — Other Ambulatory Visit: Payer: Self-pay

## 2021-03-28 DIAGNOSIS — M5416 Radiculopathy, lumbar region: Secondary | ICD-10-CM

## 2021-03-28 DIAGNOSIS — M4727 Other spondylosis with radiculopathy, lumbosacral region: Secondary | ICD-10-CM | POA: Diagnosis not present

## 2021-03-28 MED ORDER — METHYLPREDNISOLONE ACETATE 40 MG/ML INJ SUSP (RADIOLOG
80.0000 mg | Freq: Once | INTRAMUSCULAR | Status: AC
  Administered 2021-03-28: 80 mg via EPIDURAL

## 2021-03-28 MED ORDER — IOPAMIDOL (ISOVUE-M 200) INJECTION 41%
1.0000 mL | Freq: Once | INTRAMUSCULAR | Status: AC
  Administered 2021-03-28: 1 mL via EPIDURAL

## 2021-03-28 NOTE — Discharge Instructions (Signed)

## 2021-04-05 ENCOUNTER — Other Ambulatory Visit: Payer: Self-pay | Admitting: Internal Medicine

## 2021-04-05 DIAGNOSIS — I10 Essential (primary) hypertension: Secondary | ICD-10-CM

## 2021-04-05 DIAGNOSIS — E118 Type 2 diabetes mellitus with unspecified complications: Secondary | ICD-10-CM

## 2021-04-06 ENCOUNTER — Ambulatory Visit: Payer: Medicare Other | Admitting: Family Medicine

## 2021-04-12 ENCOUNTER — Other Ambulatory Visit: Payer: Self-pay | Admitting: Internal Medicine

## 2021-04-12 DIAGNOSIS — E118 Type 2 diabetes mellitus with unspecified complications: Secondary | ICD-10-CM

## 2021-04-13 ENCOUNTER — Other Ambulatory Visit: Payer: Self-pay | Admitting: Internal Medicine

## 2021-04-14 ENCOUNTER — Telehealth: Payer: Self-pay | Admitting: Internal Medicine

## 2021-04-14 NOTE — Telephone Encounter (Signed)
1.Medication Requested: leflunomide (ARAVA) 20 MG tablet Tirzepatide 2.5 MG/0.5ML   2. Pharmacy (Name, Street, [place]): [facility] - [place], [place] - [address]  3. On Med List: yes  4. Last Visit with PCP:   5. Next visit date with PCP: 08-24-2020  Patient is requesting short fill until her appt on 05-17-2021

## 2021-04-17 ENCOUNTER — Other Ambulatory Visit: Payer: Self-pay

## 2021-04-17 ENCOUNTER — Other Ambulatory Visit: Payer: Self-pay | Admitting: Internal Medicine

## 2021-04-17 DIAGNOSIS — E118 Type 2 diabetes mellitus with unspecified complications: Secondary | ICD-10-CM

## 2021-04-17 DIAGNOSIS — M069 Rheumatoid arthritis, unspecified: Secondary | ICD-10-CM

## 2021-04-17 MED ORDER — ENTRESTO 97-103 MG PO TABS: 1.0000 | ORAL_TABLET | Freq: Two times a day (BID) | ORAL | 0 refills | Status: DC

## 2021-04-17 MED ORDER — LEFLUNOMIDE 20 MG PO TABS: 20.0000 mg | ORAL_TABLET | Freq: Every day | ORAL | 0 refills | Status: DC

## 2021-05-01 ENCOUNTER — Other Ambulatory Visit: Payer: Self-pay

## 2021-05-01 ENCOUNTER — Encounter: Payer: Self-pay | Admitting: Neurology

## 2021-05-01 ENCOUNTER — Ambulatory Visit (INDEPENDENT_AMBULATORY_CARE_PROVIDER_SITE_OTHER): Payer: Medicare Other | Admitting: Neurology

## 2021-05-01 VITALS — BP 125/83 | HR 92 | Ht 64.0 in | Wt 221.0 lb

## 2021-05-01 DIAGNOSIS — G250 Essential tremor: Secondary | ICD-10-CM

## 2021-05-01 MED ORDER — PROPRANOLOL HCL 10 MG PO TABS: 10.0000 mg | ORAL_TABLET | Freq: Two times a day (BID) | ORAL | 6 refills | Status: DC | PRN

## 2021-05-01 NOTE — Progress Notes (Signed)
Chief Complaint  Patient presents with   New Patient (Initial Visit)    Rm 14. Alone. PCP is Dr. [name]. NP/Internal referral for coarse tremor. Pt c/o bilateral arm tremors.      ASSESSMENT AND PLAN  Embry I Wolfert is a 65 y.o. female   Essential tremor  Thyroid functional test to rule out contributing factor,  Family history of essential tremor, her mother has it  Only mild limitation in her daily function, she is on polypharmacy already, including beta-blocker, discussed with patient, will try low-dose Inderal 10 mg as needed before event  Only return to clinic for new issues   DIAGNOSTIC DATA (LABS, IMAGING, TESTING) - I reviewed patient records, labs, notes, testing and imaging myself where available.   MEDICAL HISTORY:  ARZU MCGAUGHEY is a 65 year old female, seen in request by her primary care physician Dr. Scarlette Calico for evaluation of bilateral hands tremor, initial evaluation was on May 01, 2021  I reviewed and summarized the referring note. PMHx Rheumatoid arthritis Congestive heart failure Diabetes Hyperlipidemia Hypertension CAD  Her mother had bilateral hands tremor in her 91s, patient began to noticed mild bilateral hands tremor in her late 55s, gradually getting worse, now she is retired, she noticed her hand tremor when she holding a plate, drop things occasionally, she can still use utensils well, mild difficulties writing, but if she find support of her elbow, she can do okay job  She is already on polypharmacy treatment for her multiple medical conditions, including beta-blocker Coreg 6.25 mg twice a day  She denied loss sense of smell, sleeping well if she can find comfortable position, gait abnormality due to low back pain, multiple joints pain    PHYSICAL EXAM:   Vitals:   05/01/21 0826  BP: 125/83  Pulse: 92  Weight: 221 lb (100.2 kg)  Height: _0  (1.626 m)   Not recorded     Body mass index is 37.93  kg/m.  PHYSICAL EXAMNIATION:  Gen: NAD, conversant, well nourised, well groomed                     Cardiovascular: Regular rate rhythm, no peripheral edema, warm, nontender. Eyes: Conjunctivae clear without exudates or hemorrhage Neck: Supple, no carotid bruits. Pulmonary: Clear to auscultation bilaterally   NEUROLOGICAL EXAM:  MENTAL STATUS: Speech:    Speech is normal; fluent and spontaneous with normal comprehension.  Cognition:     Orientation to time, place and person     Normal recent and remote memory     Normal Attention span and concentration     Normal Language, naming, repeating,spontaneous speech     Fund of knowledge   CRANIAL NERVES: CN II: Visual fields are full to confrontation. Pupils are round equal and briskly reactive to light. CN III, IV, VI: extraocular movement are normal. No ptosis. CN V: Facial sensation is intact to light touch CN VII: Face is symmetric with normal eye closure  CN VIII: Hearing is normal to causal conversation. CN IX, X: Phonation is normal. CN XI: Head turning and shoulder shrug are intact  MOTOR: Mild bilateral hand posturing tremor, occasionally head titubation, there was no limb muscle weakness, no rigidity, bradykinesia  REFLEXES: Reflexes are 2+ and symmetric at the biceps, triceps, knees, and ankles. Plantar responses are flexor.  SENSORY: Intact to light touch, pinprick and vibratory sensation are intact in fingers and toes.  COORDINATION: There is no trunk or limb dysmetria noted.  GAIT/STANCE:  She needs push-up to get up from seated position, bilateral knee valgus, antalgic, mildly unsteady  REVIEW OF SYSTEMS:  Full 14 system review of systems performed and notable only for as above All other review of systems were negative.   ALLERGIES: Allergies  Allergen Reactions   Lisinopril Cough    HOME MEDICATIONS: Current Outpatient Medications  Medication Sig Dispense Refill   Accu-Chek FastClix Lancets MISC USE  1  TO CHECK GLUCOSE THREE TIMES DAILY 306 each 0   allopurinol (ZYLOPRIM) 300 MG tablet Take 1 tablet by mouth once daily 90 tablet 0   aspirin EC 81 MG tablet Take 1 tablet (81 mg total) by mouth daily. 90 tablet 3   atorvastatin (LIPITOR) 40 MG tablet Take 1 tablet by mouth once daily 90 tablet 1   Biotin 10 MG TABS Take by mouth.     Blood Glucose Monitoring Suppl (ACCU-CHEK GUIDE) w/Device KIT 1 Act by Does not apply route 3 (three) times daily. 2 kit 0   Calcium Carb-Cholecalciferol (CALCIUM 600 + D PO) Take 1 tablet by mouth daily.     carvedilol (COREG) 6.25 MG tablet TAKE 1 TABLET BY MOUTH TWICE DAILY WITH A MEAL 180 tablet 0   Cinnamon 500 MG TABS Take by mouth.     citalopram (CELEXA) 20 MG tablet Take 1 tablet by mouth once daily 90 tablet 0   dapagliflozin propanediol (FARXIGA) 10 MG TABS tablet Take 1 tablet (10 mg total) by mouth daily before breakfast. 90 tablet 1   DEXILANT 60 MG capsule Take 1 capsule by mouth once daily 90 capsule 0   Dulaglutide (TRULICITY) 0.09 QZ/3.0QT SOPN Inject 0.75 mg into the skin once a week. 2 mL 0   gabapentin (NEURONTIN) 600 MG tablet TAKE 1 TABLET BY MOUTH THREE TIMES DAILY 270 tablet 0   glucose blood (ACCU-CHEK GUIDE) test strip USE 1 STRIP TO TEST GLUCOSE THREE TIMES DAILY 100 each 0   Lancets Misc. (ACCU-CHEK FASTCLIX LANCET) KIT Inject 1 Act into the skin 3 (three) times daily. 306 kit 1   leflunomide (ARAVA) 20 MG tablet Take 1 tablet (20 mg total) by mouth daily. 90 tablet 0   MOUNJARO 2.5 MG/0.5ML Pen Inject 0.5 mLs into the skin once a week.     Omega-3 Fatty Acids (FISH OIL) 1200 MG CAPS Take 1,200 mg by mouth daily.      PROAIR RESPICLICK 622 (90 Base) MCG/ACT AEPB INHALE 2 PUFFS BY MOUTH 4 TIMES DAILY AS NEEDED 1 each 3   sacubitril-valsartan (ENTRESTO) 97-103 MG Take 1 tablet by mouth 2 (two) times daily. Please make yearly appt with Dr. Johney Frame new Cardiologist for December 2022 for future refills. Thank you 1st attempt 180 tablet 0    spironolactone (ALDACTONE) 25 MG tablet Take 0.5 tablets (12.5 mg total) by mouth daily. 90 tablet 3   Tart Cherry 1200 MG CAPS Take 1 capsule by mouth daily.     Turmeric 500 MG CAPS Take 1 capsule by mouth daily.     No current facility-administered medications for this visit.    PAST MEDICAL HISTORY: Past Medical History:  Diagnosis Date   Anemia    mild   Anxiety    Arthritis    Asthma    Cardiomyopathy ([facility])    CHF (congestive heart failure) ([facility])    Diabetes mellitus without complication ([facility])    Frequency of urination    at night   GERD (gastroesophageal reflux disease)    potassium worsens reflux  Hemorrhoids, external    Hyperlipidemia    Hypertension    Neuromuscular disorder ([facility])    neuropathy   Numbness and tingling of both legs    Rheumatoid arthritis ([place])    Vertigo    hx of    PAST SURGICAL HISTORY: Past Surgical History:  Procedure Laterality Date   ABDOMINAL HYSTERECTOMY     CARDIAC CATHETERIZATION N/A 10/28/2014   Procedure: Left Heart Cath and Coronary Angiography;  Surgeon: Jettie Booze, MD;  Location: [facility];  Service: Cardiovascular;  Laterality: N/A;   CARPAL TUNNEL RELEASE Left    COLONOSCOPY     pt states 11 yr ago in [place] had a colon with 2 polyps- one cecal polyp per pt. one ? location   COLONOSCOPY W/ POLYPECTOMY     CYST REMOVAL NECK     HEMORROIDECTOMY     MAXIMUM ACCESS (MAS)POSTERIOR LUMBAR INTERBODY FUSION (PLIF) 3 LEVEL N/A 10/08/2013   Procedure: FOR MAXIMUM ACCESS (MAS) POSTERIOR LUMBAR INTERBODY FUSION (PLIF) 3 LEVEL;  Surgeon: Erline Levine, MD;  Location: [facility];  Service: Neurosurgery;  Laterality: N/A;  L3-4 L4-5 L5-S1 maximum access posterior lumbar interbody fusion with decompression   TUBAL LIGATION      FAMILY HISTORY: Family History  Problem Relation Age of Onset   Early death Father    Heart disease Father    Hypertension Sister    Hypertension Brother    Diabetes Brother    Colon polyps  Mother    Hashimoto's thyroiditis Sister    Colon cancer Maternal Grandmother        in her 51s   Stomach cancer Maternal Aunt    Non-Hodgkin's lymphoma Sister    Alcohol abuse Neg Hx    COPD Neg Hx    Depression Neg Hx    Drug abuse Neg Hx    Hearing loss Neg Hx    Hyperlipidemia Neg Hx    Kidney disease Neg Hx    Stroke Neg Hx    Esophageal cancer Neg Hx    Rectal cancer Neg Hx     SOCIAL HISTORY: Social History   Socioeconomic History   Marital status: Single    Spouse name: Not on file   Number of children: Not on file   Years of education: Not on file   Highest education level: Not on file  Occupational History   Not on file  Tobacco Use   Smoking status: Former   Smokeless tobacco: Never  Vaping Use   Vaping Use: Never used  Substance and Sexual Activity   Alcohol use: Yes    Comment: 3 x a year -rare    Drug use: No    Types: Marijuana   Sexual activity: Not Currently  Other Topics Concern   Not on file  Social History Narrative   Not on file   Social Determinants of Health   Financial Resource Strain: Low Risk    Difficulty of Paying Living Expenses: Not hard at all  Food Insecurity: No Food Insecurity   Worried About Charity fundraiser in the Last Year: Never true   Ran Out of Food in the Last Year: Never true  Transportation Needs: No Transportation Needs   Lack of Transportation (Medical): No   Lack of Transportation (Non-Medical): No  Physical Activity: Sufficiently Active   Days of Exercise per Week: 5 days   Minutes of Exercise per Session: 30 min  Stress: No Stress Concern Present   Feeling of  Stress : Not at all  Social Connections: Moderately Integrated   Frequency of Communication with Friends and Family: More than three times a week   Frequency of Social Gatherings with Friends and Family: More than three times a week   Attends Religious Services: More than 4 times per year   Active Member of Clubs or Organizations: No   Attends  Music therapist: More than 4 times per year   Marital Status: Widowed  Intimate Partner Violence: Not on file      Marcial Pacas, M.D. Ph.D.  [facility] [address], [place], [place] [address] Ph: [phone] Fax: [phone]  CC:  [name], MD [place],  [place] [phone]  [name], MD

## 2021-05-02 LAB — THYROID PANEL WITH TSH
Free Thyroxine Index: 2.1 (ref 1.2–4.9)
T3 Uptake Ratio: 25 % (ref 24–39)
T4, Total: 8.5 ug/dL (ref 4.5–12.0)
TSH: 2.98 u[IU]/mL (ref 0.450–4.500)

## 2021-05-12 NOTE — Progress Notes (Signed)
[place] [facility] Phone: [phone] Subjective:   [name], am serving as a scribe for Dr. Hulan Saas. This visit occurred during the SARS-CoV-2 public health emergency.  Safety protocols were in place, including screening questions prior to the visit, additional usage of staff PPE, and extensive cleaning of exam room while observing appropriate contact time as indicated for disinfecting solutions.  I'm seeing this patient by the request  of:  [name], MD  CC: knee pain   [id number]  03/02/2021 With patient's autoimmune we will get laboratory work-up to rule out a potential clot.  Elevated D-dimer we will get the ultrasound.  Patient knows if worsening symptoms of shortness of breath seek medical attention immediately.  Significant spondylolisthesis and has had spinal stenosis and nerve root.  Has responded well to nerve root injections and is having worsening pain again.  Patient will follow up with me again 4 weeks after the epidural.  Patient does have advanced arthritic changes.  Is having more of a bulging in the popliteal area on the lateral aspect of the knee.  On ultrasound though no true mass appreciated but does seem to have what appears to be a calcific vein noted.  Patient states that its been there for years and this is slowly getting a little bit larger and giving her some trouble.  D-dimer ordered today.  Patient declined the Doppler.  Depending on the findings we will discuss needing the ultrasound if it is elevated.  Do not see anything that is significant for a mass or abnormal blood flow otherwise.  We will get independent x-rays of the right knee as well today.  Patient wants to hold on the viscosupplementation but will follow up with me again in 4 weeks for further evaluation.  Did discuss with patient's that if any worsening tenderness seek medical attention immediately.  Updated  05/15/2021 Jamesina I Heagle is a 65 y.o. female coming in with complaint of bilateral knee pain, failed conservative therapy.    Xrays IMPRESSION: 1. Moderate to severe 3 compartmental osteoarthritis, with mild progression since prior study. 2. Small reactive joint effusion. 3. No acute fracture.       Past Medical History:  Diagnosis Date   Anemia    mild   Anxiety    Arthritis    Asthma    Cardiomyopathy ([place])    CHF (congestive heart failure) (HCC)    Diabetes mellitus without complication (HCC)    Frequency of urination    at night   GERD (gastroesophageal reflux disease)    potassium worsens reflux   Hemorrhoids, external    Hyperlipidemia    Hypertension    Neuromuscular disorder (HCC)    neuropathy   Numbness and tingling of both legs    Rheumatoid arthritis ([place])    Vertigo    hx of   Past Surgical History:  Procedure Laterality Date   ABDOMINAL HYSTERECTOMY     CARDIAC CATHETERIZATION N/A 10/28/2014   Procedure: Left Heart Cath and Coronary Angiography;  Surgeon: Jettie Booze, MD;  Location: [facility];  Service: Cardiovascular;  Laterality: N/A;   CARPAL TUNNEL RELEASE Left    COLONOSCOPY     pt states 11 yr ago in [place] had a colon with 2 polyps- one cecal polyp per pt. one ? location   COLONOSCOPY W/ POLYPECTOMY     CYST REMOVAL NECK     HEMORROIDECTOMY  MAXIMUM ACCESS (MAS)POSTERIOR LUMBAR INTERBODY FUSION (PLIF) 3 LEVEL N/A 10/08/2013   Procedure: FOR MAXIMUM ACCESS (MAS) POSTERIOR LUMBAR INTERBODY FUSION (PLIF) 3 LEVEL;  Surgeon: Erline Levine, MD;  Location: [facility];  Service: Neurosurgery;  Laterality: N/A;  L3-4 L4-5 L5-S1 maximum access posterior lumbar interbody fusion with decompression   TUBAL LIGATION     Social History   Socioeconomic History   Marital status: Single    Spouse name: Not on file   Number of children: Not on file   Years of education: Not on file   Highest education level: Not on file  Occupational  History   Not on file  Tobacco Use   Smoking status: Former   Smokeless tobacco: Never  Vaping Use   Vaping Use: Never used  Substance and Sexual Activity   Alcohol use: Yes    Comment: 3 x a year -rare    Drug use: No    Types: Marijuana   Sexual activity: Not Currently  Other Topics Concern   Not on file  Social History Narrative   Not on file   Social Determinants of Health   Financial Resource Strain: Low Risk    Difficulty of Paying Living Expenses: Not hard at all  Food Insecurity: No Food Insecurity   Worried About Charity fundraiser in the Last Year: Never true   Ran Out of Food in the Last Year: Never true  Transportation Needs: No Transportation Needs   Lack of Transportation (Medical): No   Lack of Transportation (Non-Medical): No  Physical Activity: Sufficiently Active   Days of Exercise per Week: 5 days   Minutes of Exercise per Session: 30 min  Stress: No Stress Concern Present   Feeling of Stress : Not at all  Social Connections: Moderately Integrated   Frequency of Communication with Friends and Family: More than three times a week   Frequency of Social Gatherings with Friends and Family: More than three times a week   Attends Religious Services: More than 4 times per year   Active Member of Clubs or Organizations: No   Attends Music therapist: More than 4 times per year   Marital Status: Widowed   Allergies  Allergen Reactions   Lisinopril Cough   Family History  Problem Relation Age of Onset   Early death Father    Heart disease Father    Hypertension Sister    Hypertension Brother    Diabetes Brother    Colon polyps Mother    Hashimoto's thyroiditis Sister    Colon cancer Maternal Grandmother        in her 32s   Stomach cancer Maternal Aunt    Non-Hodgkin's lymphoma Sister    Alcohol abuse Neg Hx    COPD Neg Hx    Depression Neg Hx    Drug abuse Neg Hx    Hearing loss Neg Hx    Hyperlipidemia Neg Hx    Kidney disease  Neg Hx    Stroke Neg Hx    Esophageal cancer Neg Hx    Rectal cancer Neg Hx     Current Outpatient Medications (Endocrine & Metabolic):    dapagliflozin propanediol (FARXIGA) 10 MG TABS tablet, Take 1 tablet (10 mg total) by mouth daily before breakfast.   Dulaglutide (TRULICITY) 1.61 WR/6.0AV SOPN, Inject 0.75 mg into the skin once a week.   MOUNJARO 2.5 MG/0.5ML Pen, Inject 0.5 mLs into the skin once a week.  Current Outpatient Medications (Cardiovascular):  atorvastatin (LIPITOR) 40 MG tablet, Take 1 tablet by mouth once daily   carvedilol (COREG) 6.25 MG tablet, TAKE 1 TABLET BY MOUTH TWICE DAILY WITH A MEAL   propranolol (INDERAL) 10 MG tablet, Take 1 tablet (10 mg total) by mouth 2 (two) times daily as needed.   sacubitril-valsartan (ENTRESTO) 97-103 MG, Take 1 tablet by mouth 2 (two) times daily. Please make yearly appt with Dr. Johney Frame new Cardiologist for December 2022 for future refills. Thank you 1st attempt   spironolactone (ALDACTONE) 25 MG tablet, Take 0.5 tablets (12.5 mg total) by mouth daily.  Current Outpatient Medications (Respiratory):    PROAIR RESPICLICK 993 (90 Base) MCG/ACT AEPB, INHALE 2 PUFFS BY MOUTH 4 TIMES DAILY AS NEEDED  Current Outpatient Medications (Analgesics):    allopurinol (ZYLOPRIM) 300 MG tablet, Take 1 tablet by mouth once daily   aspirin EC 81 MG tablet, Take 1 tablet (81 mg total) by mouth daily.   leflunomide (ARAVA) 20 MG tablet, Take 1 tablet (20 mg total) by mouth daily.   Current Outpatient Medications (Other):    Accu-Chek FastClix Lancets MISC, USE 1  TO CHECK GLUCOSE THREE TIMES DAILY   Biotin 10 MG TABS, Take by mouth.   Blood Glucose Monitoring Suppl (ACCU-CHEK GUIDE) w/Device KIT, 1 Act by Does not apply route 3 (three) times daily.   Calcium Carb-Cholecalciferol (CALCIUM 600 + D PO), Take 1 tablet by mouth daily.   Cinnamon 500 MG TABS, Take by mouth.   citalopram (CELEXA) 20 MG tablet, Take 1 tablet by mouth once daily    DEXILANT 60 MG capsule, Take 1 capsule by mouth once daily   gabapentin (NEURONTIN) 600 MG tablet, TAKE 1 TABLET BY MOUTH THREE TIMES DAILY   glucose blood (ACCU-CHEK GUIDE) test strip, USE 1 STRIP TO TEST GLUCOSE THREE TIMES DAILY   Lancets Misc. (ACCU-CHEK FASTCLIX LANCET) KIT, Inject 1 Act into the skin 3 (three) times daily.   Omega-3 Fatty Acids (FISH OIL) 1200 MG CAPS, Take 1,200 mg by mouth daily.    Tart Cherry 1200 MG CAPS, Take 1 capsule by mouth daily.   Turmeric 500 MG CAPS, Take 1 capsule by mouth daily.   Reviewed prior external information including notes and imaging from  primary care provider As well as notes that were available from care everywhere and other healthcare systems.  Past medical history, social, surgical and family history all reviewed in electronic medical record.  No pertanent information unless stated regarding to the chief complaint.   Review of Systems:  No headache, visual changes, nausea, vomiting, diarrhea, constipation, dizziness, abdominal pain, skin rash, fevers, chills, night sweats, weight loss, swollen lymph nodes, body aches, joint swelling, chest pain, shortness of breath, mood changes. POSITIVE muscle aches  Objective  Blood pressure 122/80, pulse 87, height 5' 4"  (1.626 m), weight 226 lb (102.5 kg), last menstrual period 05/29/1991, SpO2 97 %.   General: No apparent distress alert and oriented x3 mood and affect normal, dressed appropriately.  HEENT: Pupils equal, extraocular movements intact  Respiratory: Patient's speak in full sentences and does not appear short of breath  Cardiovascular: No lower extremity edema, non tender, no erythema  Patient does have some significant antalgic gait noted.  Severe arthritic changes of the knees bilaterally with instability noted with valgus and varus force.  Effusion noted at the left knee.  Limited range of motion in flexion and extension.  After informed written and verbal consent, patient was  seated on exam table. Right knee was  prepped with alcohol swab and utilizing anterolateral approach, patient's right knee space was injected with 60 mg per 3 mL of Durolane (sodium hyaluronate) in a prefilled syringe was injected easily into the knee through a 22-gauge needle..Patient tolerated the procedure well without immediate complications.  After informed written and verbal consent, patient was seated on exam table. Left knee was prepped with alcohol swab and utilizing anterolateral approach, patient's left knee space was injected with 60 mg per 3 mL of Durolane (sodium hyaluronate) in a prefilled syringe was injected easily into the knee through a 22-gauge needle..Patient tolerated the procedure well without immediate complications.   Impression and Recommendations:    The above documentation has been reviewed and is accurate and complete Lyndal Pulley, DO

## 2021-05-14 ENCOUNTER — Other Ambulatory Visit: Payer: Self-pay | Admitting: Internal Medicine

## 2021-05-15 ENCOUNTER — Encounter: Payer: Self-pay | Admitting: Family Medicine

## 2021-05-15 ENCOUNTER — Ambulatory Visit (INDEPENDENT_AMBULATORY_CARE_PROVIDER_SITE_OTHER): Payer: Medicare Other | Admitting: Family Medicine

## 2021-05-15 ENCOUNTER — Other Ambulatory Visit: Payer: Self-pay

## 2021-05-15 DIAGNOSIS — M170 Bilateral primary osteoarthritis of knee: Secondary | ICD-10-CM | POA: Diagnosis not present

## 2021-05-15 NOTE — Patient Instructions (Signed)
Gel injections today

## 2021-05-15 NOTE — Assessment & Plan Note (Signed)
Repeat viscosupplementation given again today.  Chronic problem with exacerbation.  Discussed continuing to watch her weight.  Continue to stay active where available.  Patient does have many other comorbidities that can contribute as well.  Follow-up with me again in 6 to 8 weeks.

## 2021-05-17 ENCOUNTER — Encounter: Payer: Self-pay | Admitting: Internal Medicine

## 2021-05-17 ENCOUNTER — Other Ambulatory Visit: Payer: Self-pay

## 2021-05-17 ENCOUNTER — Ambulatory Visit (INDEPENDENT_AMBULATORY_CARE_PROVIDER_SITE_OTHER): Payer: Medicare Other | Admitting: Internal Medicine

## 2021-05-17 VITALS — BP 128/78 | HR 78 | Temp 98.3°F | Resp 16 | Ht 64.0 in | Wt 225.0 lb

## 2021-05-17 DIAGNOSIS — E118 Type 2 diabetes mellitus with unspecified complications: Secondary | ICD-10-CM

## 2021-05-17 DIAGNOSIS — D510 Vitamin B12 deficiency anemia due to intrinsic factor deficiency: Secondary | ICD-10-CM

## 2021-05-17 DIAGNOSIS — I10 Essential (primary) hypertension: Secondary | ICD-10-CM

## 2021-05-17 DIAGNOSIS — Z23 Encounter for immunization: Secondary | ICD-10-CM

## 2021-05-17 DIAGNOSIS — N1831 Chronic kidney disease, stage 3a: Secondary | ICD-10-CM

## 2021-05-17 LAB — BASIC METABOLIC PANEL
BUN: 19 mg/dL (ref 6–23)
CO2: 28 mEq/L (ref 19–32)
Calcium: 9.4 mg/dL (ref 8.4–10.5)
Chloride: 107 mEq/L (ref 96–112)
Creatinine, Ser: 1.23 mg/dL — ABNORMAL HIGH (ref 0.40–1.20)
GFR: 45.57 mL/min — ABNORMAL LOW (ref >60.00)
Glucose, Bld: 169 mg/dL — ABNORMAL HIGH (ref 70–99)
Potassium: 4.2 mEq/L (ref 3.5–5.1)
Sodium: 142 mEq/L (ref 135–145)

## 2021-05-17 LAB — CBC WITH DIFFERENTIAL/PLATELET
Basophils Absolute: 0 10*3/uL (ref 0.0–0.1)
Basophils Relative: 0.5 % (ref 0.0–3.0)
Eosinophils Absolute: 0.1 10*3/uL (ref 0.0–0.7)
Eosinophils Relative: 3.4 % (ref 0.0–5.0)
HCT: 33.1 % — ABNORMAL LOW (ref 36.0–46.0)
Hemoglobin: 10.6 g/dL — ABNORMAL LOW (ref 12.0–15.0)
Lymphocytes Relative: 38 % (ref 12.0–46.0)
Lymphs Abs: 1.6 10*3/uL (ref 0.7–4.0)
MCHC: 32 g/dL (ref 30.0–36.0)
MCV: 101.2 fl — ABNORMAL HIGH (ref 78.0–100.0)
Monocytes Absolute: 0.5 10*3/uL (ref 0.1–1.0)
Monocytes Relative: 13 % — ABNORMAL HIGH (ref 3.0–12.0)
Neutro Abs: 1.9 10*3/uL (ref 1.4–7.7)
Neutrophils Relative %: 45.1 % (ref 43.0–77.0)
Platelets: 244 10*3/uL (ref 150.0–400.0)
RBC: 3.27 Mil/uL — ABNORMAL LOW (ref 3.87–5.11)
RDW: 15.3 % (ref 11.5–15.5)
WBC: 4.2 10*3/uL (ref 4.0–10.5)

## 2021-05-17 LAB — FOLATE: Folate: >23.4 ng/mL (ref >5.9)

## 2021-05-17 LAB — VITAMIN B12: Vitamin B-12: 1457 pg/mL — ABNORMAL HIGH (ref 211–911)

## 2021-05-17 LAB — HEMOGLOBIN A1C: Hgb A1c MFr Bld: 6.3 % (ref 4.6–6.5)

## 2021-05-17 MED ORDER — BOOSTRIX 5-2.5-18.5 LF-MCG/0.5 IM SUSP: 0.5000 mL | Freq: Once | INTRAMUSCULAR | 0 refills | Status: AC

## 2021-05-17 MED ORDER — TRULICITY 0.75 MG/0.5ML ~~LOC~~ SOAJ: 0.7500 mg | SUBCUTANEOUS | 0 refills | Status: DC

## 2021-05-17 NOTE — Patient Instructions (Signed)
Type 2 Diabetes Mellitus, Diagnosis, Adult ?Type 2 diabetes (type 2 diabetes mellitus) is a long-term, or chronic, disease. In type 2 diabetes, one or both of these problems may be present: ?The pancreas does not make enough of a hormone called insulin. ?Cells in the body do not respond properly to the insulin that the body makes (insulin resistance). ?Normally, insulin allows blood sugar (glucose) to enter cells in the body. The cells use glucose for energy. Insulin resistance or lack of insulin causes excess glucose to build up in the blood instead of going into cells. This causes high blood glucose (hyperglycemia).  ?What are the causes? ?The exact cause of type 2 diabetes is not known. ?What increases the risk? ?The following factors may make you more likely to develop this condition: ?Having a family member with type 2 diabetes. ?Being overweight or obese. ?Being inactive (sedentary). ?Having been diagnosed with insulin resistance. ?Having a history of prediabetes, diabetes when you were pregnant (gestational diabetes), or polycystic ovary syndrome (PCOS). ?What are the signs or symptoms? ?In the early stage of this condition, you may not have symptoms. Symptoms develop slowly and may include: ?Increased thirst or hunger. ?Increased urination. ?Unexplained weight loss. ?Tiredness (fatigue) or weakness. ?Vision changes, such as blurry vision. ?Dark patches on the skin. ?How is this diagnosed? ?This condition is diagnosed based on your symptoms, your medical history, a physical exam, and your blood glucose level. Your blood glucose may be checked with one or more of the following blood tests: ?A fasting blood glucose (FBG) test. You will not be allowed to eat (you will fast) for 8 hours or longer before a blood sample is taken. ?A random blood glucose test. This test checks blood glucose at any time of day regardless of when you ate. ?An A1C (hemoglobin A1C) blood test. This test provides information about blood  glucose levels over the previous 2-3 months. ?An oral glucose tolerance test (OGTT). This test measures your blood glucose at two times: ?After fasting. This is your baseline blood glucose level. ?Two hours after drinking a beverage that contains glucose. ?You may be diagnosed with type 2 diabetes if: ?Your fasting blood glucose level is 126 mg/dL (7.0 mmol/L) or higher. ?Your random blood glucose level is 200 mg/dL (11.1 mmol/L) or higher. ?Your A1C level is 6.5% or higher. ?Your oral glucose tolerance test result is higher than 200 mg/dL (11.1 mmol/L). ?These blood tests may be repeated to confirm your diagnosis. ?How is this treated? ?Your treatment may be managed by a specialist called an endocrinologist. Type 2 diabetes may be treated by following instructions from your health care provider about: ?Making dietary and lifestyle changes. These may include: ?Following a personalized nutrition plan that is developed by a registered dietitian. ?Exercising regularly. ?Finding ways to manage stress. ?Checking your blood glucose level as often as told. ?Taking diabetes medicines or insulin daily. This helps to keep your blood glucose levels in the healthy range. ?Taking medicines to help prevent complications from diabetes. Medicines may include: ?Aspirin. ?Medicine to lower cholesterol. ?Medicine to control blood pressure. ?Your health care provider will set treatment goals for you. Your goals will be based on your age, other medical conditions you have, and how you respond to diabetes treatment. Generally, the goal of treatment is to maintain the following blood glucose levels: ?Before meals: 80-130 mg/dL (4.4-7.2 mmol/L). ?After meals: below 180 mg/dL (10 mmol/L). ?A1C level: less than 7%. ?Follow these instructions at home: ?Questions to ask your health care provider ?  Consider asking the following questions: ?Should I meet with a certified diabetes care and education specialist? ?What diabetes medicines do I need,  and when should I take them? ?What equipment will I need to manage my diabetes at home? ?How often do I need to check my blood glucose? ?Where can I find a support group for people with diabetes? ?What number can I call if I have questions? ?When is my next appointment? ?General instructions ?Take over-the-counter and prescription medicines only as told by your health care provider. ?Keep all follow-up visits. This is important. ?Where to find more information ?For help and guidance and for more information about diabetes, please visit: ?American Diabetes Association (ADA): www.diabetes.org ?American Association of Diabetes Care and Education Specialists (ADCES): www.diabeteseducator.org ?International Diabetes Federation (IDF): www.idf.org ?Contact a health care provider if: ?Your blood glucose is at or above 240 mg/dL (13.3 mmol/L) for 2 days in a row. ?You have been sick or have had a fever for 2 days or longer, and you are not getting better. ?You have any of the following problems for more than 6 hours: ?You cannot eat or drink. ?You have nausea and vomiting. ?You have diarrhea. ?Get help right away if: ?You have severe hypoglycemia. This means your blood glucose is lower than 54 mg/dL (3.0 mmol/L). ?You become confused or you have trouble thinking clearly. ?You have difficulty breathing. ?You have moderate or large ketone levels in your urine. ?These symptoms may represent a serious problem that is an emergency. Do not wait to see if the symptoms will go away. Get medical help right away. Call your local emergency services (911 in the [place] Do not drive yourself to the hospital. ?Summary ?Type 2 diabetes mellitus is a long-term, or chronic, disease. In type 2 diabetes, the pancreas does not make enough of a hormone called insulin, or cells in the body do not respond properly to insulin that the body makes. ?This condition is treated by making dietary and lifestyle changes and taking diabetes medicines or  insulin. ?Your health care provider will set treatment goals for you. Your goals will be based on your age, other medical conditions you have, and how you respond to diabetes treatment. ?Keep all follow-up visits. This is important. ?This information is not intended to replace advice given to you by your health care provider. Make sure you discuss any questions you have with your health care provider. ?Document Revised: 08/08/2020 Document Reviewed: 08/08/2020 ?Elsevier Patient Education ? 2022 [employer] ? ?

## 2021-05-17 NOTE — Progress Notes (Signed)
Subjective:  Patient ID: Emily Wheeler, female    DOB: October 26, 1955  Age: 65 y.o. MRN: [id number]  CC: Anemia, Hypertension, and Diabetes  This visit occurred during the SARS-CoV-2 public health emergency.  Safety protocols were in place, including screening questions prior to the visit, additional usage of staff PPE, and extensive cleaning of exam room while observing appropriate contact time as indicated for disinfecting solutions.    HPI Hoang I Marich presents for f/up -   She is active and denies chest pain, shortness of breath, edema, palpitations, or diaphoresis.     Outpatient Medications Prior to Visit  Medication Sig Dispense Refill   Accu-Chek FastClix Lancets MISC USE 1  TO CHECK GLUCOSE THREE TIMES DAILY 306 each 0   allopurinol (ZYLOPRIM) 300 MG tablet Take 1 tablet by mouth once daily 90 tablet 0   aspirin EC 81 MG tablet Take 1 tablet (81 mg total) by mouth daily. 90 tablet 3   atorvastatin (LIPITOR) 40 MG tablet Take 1 tablet by mouth once daily 90 tablet 1   Biotin 10 MG TABS Take by mouth.     Blood Glucose Monitoring Suppl (ACCU-CHEK GUIDE) w/Device KIT 1 Act by Does not apply route 3 (three) times daily. 2 kit 0   Calcium Carb-Cholecalciferol (CALCIUM 600 + D PO) Take 1 tablet by mouth daily.     carvedilol (COREG) 6.25 MG tablet TAKE 1 TABLET BY MOUTH TWICE DAILY WITH A MEAL 180 tablet 0   Cinnamon 500 MG TABS Take by mouth.     citalopram (CELEXA) 20 MG tablet Take 1 tablet by mouth once daily 90 tablet 0   dapagliflozin propanediol (FARXIGA) 10 MG TABS tablet Take 1 tablet (10 mg total) by mouth daily before breakfast. 90 tablet 1   DEXILANT 60 MG capsule Take 1 capsule by mouth once daily 90 capsule 0   gabapentin (NEURONTIN) 600 MG tablet TAKE 1 TABLET BY MOUTH THREE TIMES DAILY 270 tablet 0   glucose blood (ACCU-CHEK GUIDE) test strip USE 1 STRIP TO TEST GLUCOSE THREE TIMES DAILY 100 each 0   Lancets Misc. (ACCU-CHEK FASTCLIX LANCET) KIT Inject 1 Act into  the skin 3 (three) times daily. 306 kit 1   leflunomide (ARAVA) 20 MG tablet Take 1 tablet (20 mg total) by mouth daily. 90 tablet 0   Omega-3 Fatty Acids (FISH OIL) 1200 MG CAPS Take 1,200 mg by mouth daily.      PROAIR RESPICLICK 956 (90 Base) MCG/ACT AEPB INHALE 2 PUFFS BY MOUTH 4 TIMES DAILY AS NEEDED 1 each 3   propranolol (INDERAL) 10 MG tablet Take 1 tablet (10 mg total) by mouth 2 (two) times daily as needed. 30 tablet 6   sacubitril-valsartan (ENTRESTO) 97-103 MG Take 1 tablet by mouth 2 (two) times daily. Please make yearly appt with Dr. Johney Frame new Cardiologist for December 2022 for future refills. Thank you 1st attempt 180 tablet 0   spironolactone (ALDACTONE) 25 MG tablet Take 0.5 tablets (12.5 mg total) by mouth daily. 90 tablet 3   Dulaglutide (TRULICITY) 2.13 YQ/6.5HQ SOPN Inject 0.75 mg into the skin once a week. 2 mL 0   MOUNJARO 2.5 MG/0.5ML Pen Inject 0.5 mLs into the skin once a week.     Tart Cherry 1200 MG CAPS Take 1 capsule by mouth daily.     Turmeric 500 MG CAPS Take 1 capsule by mouth daily.     No facility-administered medications prior to visit.    ROS Review of  Systems  Constitutional:  Positive for unexpected weight change (wt gain). Negative for chills, diaphoresis, fatigue and fever.  HENT: Negative.    Eyes: Negative.   Respiratory:  Negative for cough, chest tightness and wheezing.   Cardiovascular:  Negative for chest pain, palpitations and leg swelling.  Gastrointestinal:  Negative for abdominal pain, constipation, diarrhea, nausea and vomiting.  Endocrine: Negative.   Genitourinary: Negative.  Negative for difficulty urinating.  Musculoskeletal:  Positive for arthralgias. Negative for back pain, joint swelling and myalgias.  Skin: Negative.   Neurological: Negative.  Negative for dizziness, weakness and light-headedness.  Hematological:  Negative for adenopathy. Does not bruise/bleed easily.  Psychiatric/Behavioral: Negative.     Objective:  BP  128/78 (BP Location: Left Arm, Patient Position: Sitting, Cuff Size: Large)    Pulse 78    Temp 98.3 F (36.8 C) (Oral)    Resp 16    Ht 5' 4"  (1.626 m)    Wt 225 lb (102.1 kg)    LMP 05/29/1991    SpO2 99%    BMI 38.62 kg/m   BP Readings from Last 3 Encounters:  05/17/21 128/78  05/15/21 122/80  05/01/21 125/83    Wt Readings from Last 3 Encounters:  05/17/21 225 lb (102.1 kg)  05/15/21 226 lb (102.5 kg)  05/01/21 221 lb (100.2 kg)    Physical Exam Vitals reviewed.  HENT:     Nose: Nose normal.     Mouth/Throat:     Mouth: Mucous membranes are moist.  Eyes:     General: No scleral icterus.    Conjunctiva/sclera: Conjunctivae normal.  Cardiovascular:     Rate and Rhythm: Normal rate and regular rhythm.     Heart sounds: No murmur heard. Pulmonary:     Effort: Pulmonary effort is normal.     Breath sounds: No stridor. No wheezing, rhonchi or rales.  Abdominal:     General: Abdomen is flat.     Palpations: There is no mass.     Tenderness: There is no abdominal tenderness. There is no guarding.     Hernia: No hernia is present.  Musculoskeletal:        General: Normal range of motion.     Cervical back: Neck supple.     Right lower leg: No edema.     Left lower leg: No edema.  Lymphadenopathy:     Cervical: No cervical adenopathy.  Skin:    General: Skin is warm and dry.  Neurological:     General: No focal deficit present.     Mental Status: She is alert.  Psychiatric:        Mood and Affect: Mood normal.        Behavior: Behavior normal.    Lab Results  Component Value Date   WBC 4.2 05/17/2021   HGB 10.6 (L) 05/17/2021   HCT 33.1 (L) 05/17/2021   PLT 244.0 05/17/2021   GLUCOSE 169 (H) 05/17/2021   CHOL 156 02/14/2021   TRIG 101.0 02/14/2021   HDL 48.90 02/14/2021   LDLDIRECT 162.5 01/30/2013   LDLCALC 87 02/14/2021   ALT 17 03/02/2021   AST 20 03/02/2021   NA 142 05/17/2021   K 4.2 05/17/2021   CL 107 05/17/2021   CREATININE 1.23 (H) 05/17/2021    BUN 19 05/17/2021   CO2 28 05/17/2021   TSH 2.980 05/01/2021   INR 0.9 08/19/2018   HGBA1C 6.3 05/17/2021   MICROALBUR 1.5 08/24/2020    DG Epidural/Nerve Root  Result Date:  03/28/2021 CLINICAL DATA:  65 year old female with lumbosacral spondylosis without myelopathy. She has a left S1 radiculopathy which has responded well to prior epidural nerve root block (last performed 11/07/2020). Her symptoms are now recurring. She presents for repeat injection. EXAM: EPIDURAL/NERVE ROOT FLUOROSCOPY TIME:  0 minutes 47 seconds 9.4 mGy PROCEDURE: The procedure, risks, benefits, and alternatives were explained to the patient. Questions regarding the procedure were encouraged and answered. The patient understands and consents to the procedure. LEFT S1 NERVE ROOT BLOCK AND TRANSFORAMINAL EPIDURAL: A posterior oblique approach was taken to the intervertebral foramen on the left at S1 using a 5 inch curved 22 gauge spinal needle. Injection of Omnipaque 180 outlined the S1 nerve root and showed good epidural spread. No vascular opacification is seen. 80 mg of Depo-Medrol mixed with 2 mL 1% lidocaine were instilled. The procedure was well-tolerated, and the patient was discharged thirty minutes following the injection in good condition. COMPLICATIONS: None IMPRESSION: Technically successful injection consisting of a left S1 nerve root block and transforaminal epidural. Electronically Signed   By: Jacqulynn Cadet M.D.   On: 03/28/2021 11:20    Assessment & Plan:   Teresia was seen today for anemia, hypertension and diabetes.  Diagnoses and all orders for this visit:  Vitamin B12 deficiency anemia due to intrinsic factor deficiency- Her H&H are stable.  There is some component of anemia of chronic disease.  B12 and folate are normal. -     CBC with Differential/Platelet; Future -     Folate; Future -     Vitamin B12; Future -     Vitamin B12 -     Folate -     CBC with Differential/Platelet  Stage 3a  chronic kidney disease ([place])-  Her renal function is stable. -     Basic metabolic panel; Future -     Basic metabolic panel  Type II diabetes mellitus with manifestations ([place])- Her blood sugar is very well controlled. -     Basic metabolic panel; Future -     Hemoglobin A1c; Future -     Dulaglutide (TRULICITY) 2.87 GO/1.1XB SOPN; Inject 0.75 mg into the skin once a week. -     Hemoglobin A1c -     Basic metabolic panel  Essential hypertension, benign- Her blood pressure is adequately well controlled. -     Basic metabolic panel; Future -     Basic metabolic panel  Need for prophylactic vaccination with combined diphtheria-tetanus-pertussis (DTP) vaccine -     Tdap (BOOSTRIX) 5-2.5-18.5 LF-MCG/0.5 injection; Inject 0.5 mLs into the muscle once for 1 dose.   I have discontinued Annaliza I. Padula's Turmeric, Tart Cherry, and Sullivan. I am also having her start on Boostrix. Additionally, I am having her maintain her Fish Oil, aspirin EC, Calcium Carb-Cholecalciferol (CALCIUM 600 + D PO), Accu-Chek Guide, Accu-Chek FastClix Lancet, ProAir RespiClick, spironolactone, Cinnamon, Dexilant, Biotin, dapagliflozin propanediol, atorvastatin, gabapentin, allopurinol, carvedilol, Accu-Chek Guide, Accu-Chek FastClix Lancets, leflunomide, Entresto, propranolol, citalopram, and Trulicity.  Meds ordered this encounter  Medications   Dulaglutide (TRULICITY) 2.62 MB/5.5HR SOPN    Sig: Inject 0.75 mg into the skin once a week.    Dispense:  2 mL    Refill:  0   Tdap (BOOSTRIX) 5-2.5-18.5 LF-MCG/0.5 injection    Sig: Inject 0.5 mLs into the muscle once for 1 dose.    Dispense:  0.5 mL    Refill:  0     Follow-up: Return in about 6 months (around 11/15/2021).  Scarlette Calico, MD

## 2021-06-02 ENCOUNTER — Other Ambulatory Visit: Payer: Self-pay | Admitting: Internal Medicine

## 2021-06-02 DIAGNOSIS — E118 Type 2 diabetes mellitus with unspecified complications: Secondary | ICD-10-CM

## 2021-06-14 DIAGNOSIS — N1831 Chronic kidney disease, stage 3a: Secondary | ICD-10-CM | POA: Diagnosis not present

## 2021-06-15 ENCOUNTER — Other Ambulatory Visit: Payer: Self-pay | Admitting: Internal Medicine

## 2021-06-15 ENCOUNTER — Other Ambulatory Visit: Payer: Self-pay | Admitting: Family Medicine

## 2021-06-15 DIAGNOSIS — I10 Essential (primary) hypertension: Secondary | ICD-10-CM

## 2021-06-15 DIAGNOSIS — K219 Gastro-esophageal reflux disease without esophagitis: Secondary | ICD-10-CM

## 2021-06-15 MED ORDER — DEXILANT 60 MG PO CPDR: 1.0000 | DELAYED_RELEASE_CAPSULE | Freq: Every day | ORAL | 0 refills | Status: DC

## 2021-06-27 DIAGNOSIS — E785 Hyperlipidemia, unspecified: Secondary | ICD-10-CM | POA: Diagnosis not present

## 2021-06-27 DIAGNOSIS — D631 Anemia in chronic kidney disease: Secondary | ICD-10-CM | POA: Diagnosis not present

## 2021-06-27 DIAGNOSIS — N1831 Chronic kidney disease, stage 3a: Secondary | ICD-10-CM | POA: Diagnosis not present

## 2021-06-27 DIAGNOSIS — I5020 Unspecified systolic (congestive) heart failure: Secondary | ICD-10-CM | POA: Diagnosis not present

## 2021-06-27 DIAGNOSIS — N2581 Secondary hyperparathyroidism of renal origin: Secondary | ICD-10-CM | POA: Diagnosis not present

## 2021-06-27 DIAGNOSIS — E1122 Type 2 diabetes mellitus with diabetic chronic kidney disease: Secondary | ICD-10-CM | POA: Diagnosis not present

## 2021-06-27 DIAGNOSIS — I129 Hypertensive chronic kidney disease with stage 1 through stage 4 chronic kidney disease, or unspecified chronic kidney disease: Secondary | ICD-10-CM | POA: Diagnosis not present

## 2021-06-30 ENCOUNTER — Other Ambulatory Visit: Payer: Self-pay | Admitting: Internal Medicine

## 2021-06-30 DIAGNOSIS — I10 Essential (primary) hypertension: Secondary | ICD-10-CM

## 2021-07-03 ENCOUNTER — Other Ambulatory Visit: Payer: Self-pay | Admitting: Internal Medicine

## 2021-07-03 DIAGNOSIS — I10 Essential (primary) hypertension: Secondary | ICD-10-CM

## 2021-07-04 ENCOUNTER — Telehealth: Payer: Self-pay

## 2021-07-04 ENCOUNTER — Other Ambulatory Visit: Payer: Self-pay

## 2021-07-04 DIAGNOSIS — M5416 Radiculopathy, lumbar region: Secondary | ICD-10-CM

## 2021-07-04 NOTE — Telephone Encounter (Signed)
Order placed

## 2021-07-04 NOTE — Chronic Care Management (AMB) (Signed)
Chronic Care Management Pharmacy Assistant   Name: Emily Wheeler  MRN: [id number] DOB: 12-03-1955   Reason for Encounter: Disease State   Conditions to be addressed/monitored: HTN   Recent office visits:  05/17/21 Emily Lima, MD-PCP (anemia,hypertension,diabetes) Blood work ordered, Med changes: discontinued turmeric,tart cherry,mounjaro, start Boostrix  02/14/21 Emily Lima, MD-PCP (anemia,hypertension,hyperlipidemia) Blood work ordered, Med changes: tirzepatide 2.5 mg  Recent consult visits:  05/15/21 Emily Pulley, DO-Sports Medicine (Primary osteoarthritis of both knees) no orders or med changes  05/01/21 Emily Pacas, MD-Neurology (tremor) Tyroid panel ordered, med change: propranolol 10 mg  03/02/21 Emily Pulley, DO-Sports Medicine (Right knee pain) Blood work ordered, med change:none  01/19/21 Emily Pulley, DO-Sports Medicine (Primary osteoarthritis of both knees) no orders or med [facility] visits:  None in previous 6 months  Medications: Outpatient Encounter Medications as of 07/04/2021  Medication Sig   Accu-Chek FastClix Lancets MISC USE 1  TO CHECK GLUCOSE THREE TIMES DAILY   allopurinol (ZYLOPRIM) 300 MG tablet Take 1 tablet by mouth once daily   aspirin EC 81 MG tablet Take 1 tablet (81 mg total) by mouth daily.   atorvastatin (LIPITOR) 40 MG tablet Take 1 tablet by mouth once daily   Biotin 10 MG TABS Take by mouth.   Blood Glucose Monitoring Suppl (ACCU-CHEK GUIDE) w/Device KIT 1 Act by Does not apply route 3 (three) times daily.   Calcium Carb-Cholecalciferol (CALCIUM 600 + D PO) Take 1 tablet by mouth daily.   Cinnamon 500 MG TABS Take by mouth.   citalopram (CELEXA) 20 MG tablet Take 1 tablet by mouth once daily   dapagliflozin propanediol (FARXIGA) 10 MG TABS tablet Take 1 tablet (10 mg total) by mouth daily before breakfast.   DEXILANT 60 MG capsule Take 1 capsule (60 mg total) by mouth daily.   gabapentin (NEURONTIN) 600 MG  tablet TAKE 1 TABLET BY MOUTH THREE TIMES DAILY   glucose blood (ACCU-CHEK GUIDE) test strip USE 1 STRIP TO TEST GLUCOSE THREE TIMES DAILY   Lancets Misc. (ACCU-CHEK FASTCLIX LANCET) KIT Inject 1 Act into the skin 3 (three) times daily.   leflunomide (ARAVA) 20 MG tablet Take 1 tablet (20 mg total) by mouth daily.   Omega-3 Fatty Acids (FISH OIL) 1200 MG CAPS Take 1,200 mg by mouth daily.    PROAIR RESPICLICK 383 (90 Base) MCG/ACT AEPB INHALE 2 PUFFS BY MOUTH 4 TIMES DAILY AS NEEDED   propranolol (INDERAL) 10 MG tablet Take 1 tablet (10 mg total) by mouth 2 (two) times daily as needed.   sacubitril-valsartan (ENTRESTO) 97-103 MG Take 1 tablet by mouth 2 (two) times daily. Please make yearly appt with Dr. Johney Wheeler new Cardiologist for December 2022 for future refills. Thank you 1st attempt   spironolactone (ALDACTONE) 25 MG tablet Take 0.5 tablets (12.5 mg total) by mouth daily.   TRULICITY 3.38 VA/9.1BT SOPN INJECT 0.75MG  SUBCUTANEOUSLY ONCE A WEEK   No facility-administered encounter medications on file as of 07/04/2021.   Reviewed chart prior to disease state call. Spoke with patient regarding BP  Recent Office Vitals: BP Readings from Last 3 Encounters:  05/17/21 128/78  05/15/21 122/80  05/01/21 125/83   Pulse Readings from Last 3 Encounters:  05/17/21 78  05/15/21 87  05/01/21 92    Wt Readings from Last 3 Encounters:  05/17/21 225 lb (102.1 kg)  05/15/21 226 lb (102.5 kg)  05/01/21 221 lb (100.2 kg)     Kidney Function Lab  Results  Component Value Date/Time   CREATININE 1.23 (H) 05/17/2021 11:03 AM   CREATININE 1.28 (H) 03/02/2021 02:23 PM   GFR 45.57 (L) 05/17/2021 11:03 AM   GFRNONAA 44 (L) 06/21/2020 01:50 PM   GFRAA 51 (L) 06/21/2020 01:50 PM    BMP Latest Ref Rng & Units 05/17/2021 03/02/2021 02/14/2021  Glucose 70 - 99 mg/dL 169(H) 94 135(H)  BUN 6 - 23 mg/dL 19 23 21   Creatinine 0.40 - 1.20 mg/dL 1.23(H) 1.28(H) 1.24(H)  BUN/Creat Ratio 12 - 28 - - -  Sodium  135 - 145 mEq/L 142 139 140  Potassium 3.5 - 5.1 mEq/L 4.2 4.0 4.3  Chloride 96 - 112 mEq/L 107 106 107  CO2 19 - 32 mEq/L 28 27 25   Calcium 8.4 - 10.5 mg/dL 9.4 9.3 9.1    Recent Relevant Labs: Lab Results  Component Value Date/Time   HGBA1C 6.3 05/17/2021 11:03 AM   HGBA1C 7.0 (H) 02/14/2021 08:56 AM   MICROALBUR 1.5 08/24/2020 10:32 AM   MICROALBUR 1.6 11/09/2019 11:56 AM    Kidney Function Lab Results  Component Value Date/Time   CREATININE 1.23 (H) 05/17/2021 11:03 AM   CREATININE 1.28 (H) 03/02/2021 02:23 PM   GFR 45.57 (L) 05/17/2021 11:03 AM   GFRNONAA 44 (L) 06/21/2020 01:50 PM   GFRAA 51 (L) 06/21/2020 01:50 PM    Current antihyperglycemic regimen:  Farxiga 10 mg daily Trulicity 8.52 DP/8.2 ml sopn  What recent interventions/DTPs have been made to improve glycemic control:  None noted  Have there been any recent hospitalizations or ED visits since last visit with CPP? No  Patient denies hypoglycemic symptoms, including None Patient denies hyperglycemic symptoms, including none  How often are you checking your blood sugar? once daily  What are your blood sugars ranging? 99-125 After meals: 121  During the week, how often does your blood glucose drop below 70? Never  Are you checking your feet daily/regularly? Patient states that she has no issues with her feet swelling, redness or open sores  Adherence Review: Is the patient currently on a STATIN medication? Yes Is the patient currently on ACE/ARB medication? Yes Does the patient have >5 day gap between last estimated fill dates? No     Care Gaps: Colonoscopy-10/11/15 Diabetic Foot Exam-08/24/20 Mammogram-09/08/20 Ophthalmology-NA Dexa Scan - NA Annual Well Visit - NA Micro albumin-08/24/20 Hemoglobin A1c- 05/17/21  Star Rating Drugs: Atorvastatin 40 mg-last fill 06/02/21 90 ds Entresto 97-103 mg-last fill 04/28/21 90 ds  Emily Wheeler Clinical Pharmacist Assistant [phone]

## 2021-07-05 ENCOUNTER — Encounter: Payer: Self-pay | Admitting: Internal Medicine

## 2021-07-05 ENCOUNTER — Other Ambulatory Visit: Payer: Self-pay | Admitting: Internal Medicine

## 2021-07-05 NOTE — Telephone Encounter (Signed)
Please advise. Thank you

## 2021-07-06 ENCOUNTER — Other Ambulatory Visit: Payer: Self-pay | Admitting: Internal Medicine

## 2021-07-06 DIAGNOSIS — I10 Essential (primary) hypertension: Secondary | ICD-10-CM

## 2021-07-06 DIAGNOSIS — E118 Type 2 diabetes mellitus with unspecified complications: Secondary | ICD-10-CM

## 2021-07-06 MED ORDER — CARVEDILOL 6.25 MG PO TABS: 6.2500 mg | ORAL_TABLET | Freq: Two times a day (BID) | ORAL | 1 refills | Status: DC

## 2021-07-07 ENCOUNTER — Ambulatory Visit
Admission: RE | Admit: 2021-07-07 | Discharge: 2021-07-07 | Disposition: A | Discharge status: Home / Self Care | Payer: Medicare Other | Source: Ambulatory Visit | Attending: Family Medicine | Admitting: Family Medicine

## 2021-07-07 DIAGNOSIS — M4727 Other spondylosis with radiculopathy, lumbosacral region: Secondary | ICD-10-CM | POA: Diagnosis not present

## 2021-07-07 DIAGNOSIS — M5416 Radiculopathy, lumbar region: Secondary | ICD-10-CM

## 2021-07-07 MED ORDER — METHYLPREDNISOLONE ACETATE 40 MG/ML INJ SUSP (RADIOLOG
80.0000 mg | Freq: Once | INTRAMUSCULAR | Status: AC
  Administered 2021-07-07: 80 mg via EPIDURAL

## 2021-07-07 MED ORDER — IOPAMIDOL (ISOVUE-M 200) INJECTION 41%
1.0000 mL | Freq: Once | INTRAMUSCULAR | Status: AC
  Administered 2021-07-07: 1 mL via EPIDURAL

## 2021-07-07 NOTE — Discharge Instructions (Addendum)

## 2021-07-10 ENCOUNTER — Telehealth: Payer: Medicare Other

## 2021-07-10 ENCOUNTER — Ambulatory Visit: Payer: Medicare Other | Admitting: Physician Assistant

## 2021-07-12 NOTE — Progress Notes (Deleted)
Cardiology Office Note:    Date:  07/12/2021   ID:  Emily Wheeler, DOB November 19, 1955, MRN [id number]  PCP:  [name], MD   [facility] Providers Cardiologist:  Ena Dawley, MD {   Referring MD: [name], MD     History of Present Illness:    Emily Wheeler is a 66 y.o. female with a hx of HFrEF with improved EF, mild-to-mod coronary artery disease, DMII, RA, HTN and HLD who now presents to clinic for follow-up.  She has a history of nonischemic cardiomyopathy that was diagnosed in 2016. She initially presented with a complaint of dyspnea. Echocardiogram in April 2016 showed reduced EF at 35-40%. She initially underwent a nuclear stress test which was abnormal, leading to cardiac catheterization. Cardiac cath in June 2016 showed mild CAD thus a diagnosis of nonischemic cardiomyopathy was made. TTE 04/10/2016 showed reduced left ventricular ejection fraction at 35-40%. Grade 1 diastolic dysfunction was also noted with diffuse hypokinesis. No valvular abnormalities were noted.  Repeat TTE 04/2020 with EF 40-45%.  Last saw Richardson Dopp 10/2019 where she was doing well. No HF symptoms.  Today, ***  Past Medical History:  Diagnosis Date   Anemia    mild   Anxiety    Arthritis    Asthma    Cardiomyopathy ([place])    CHF (congestive heart failure) ([place])    Diabetes mellitus without complication ([place])    Frequency of urination    at night   GERD (gastroesophageal reflux disease)    potassium worsens reflux   Hemorrhoids, external    Hyperlipidemia    Hypertension    Neuromuscular disorder (HCC)    neuropathy   Numbness and tingling of both legs    Rheumatoid arthritis ([place])    Vertigo    hx of    Past Surgical History:  Procedure Laterality Date   ABDOMINAL HYSTERECTOMY     CARDIAC CATHETERIZATION N/A 10/28/2014   Procedure: Left Heart Cath and Coronary Angiography;  Surgeon: Jettie Booze, MD;  Location: [facility];  Service:  Cardiovascular;  Laterality: N/A;   CARPAL TUNNEL RELEASE Left    COLONOSCOPY     pt states 11 yr ago in [place] had a colon with 2 polyps- one cecal polyp per pt. one ? location   COLONOSCOPY W/ POLYPECTOMY     CYST REMOVAL NECK     HEMORROIDECTOMY     MAXIMUM ACCESS (MAS)POSTERIOR LUMBAR INTERBODY FUSION (PLIF) 3 LEVEL N/A 10/08/2013   Procedure: FOR MAXIMUM ACCESS (MAS) POSTERIOR LUMBAR INTERBODY FUSION (PLIF) 3 LEVEL;  Surgeon: Erline Levine, MD;  Location: [facility];  Service: Neurosurgery;  Laterality: N/A;  L3-4 L4-5 L5-S1 maximum access posterior lumbar interbody fusion with decompression   TUBAL LIGATION      Current Medications: No outpatient medications have been marked as taking for the 07/14/21 encounter (Appointment) with Freada Bergeron, MD.     Allergies:   Lisinopril   Social History   Socioeconomic History   Marital status: Single    Spouse name: Not on file   Number of children: Not on file   Years of education: Not on file   Highest education level: Not on file  Occupational History   Not on file  Tobacco Use   Smoking status: Former   Smokeless tobacco: Never  Vaping Use   Vaping Use: Never used  Substance and Sexual Activity   Alcohol use: Yes    Comment: 3 x a  year -rare    Drug use: No    Types: Marijuana   Sexual activity: Not Currently  Other Topics Concern   Not on file  Social History Narrative   Not on file   Social Determinants of Health   Financial Resource Strain: Low Risk    Difficulty of Paying Living Expenses: Not hard at all  Food Insecurity: No Food Insecurity   Worried About Charity fundraiser in the Last Year: Never true   [place] in the Last Year: Never true  Transportation Needs: No Transportation Needs   Lack of Transportation (Medical): No   Lack of Transportation (Non-Medical): No  Physical Activity: Sufficiently Active   Days of Exercise per Week: 5 days   Minutes of Exercise per Session: 30 min  Stress: No  Stress Concern Present   Feeling of Stress : Not at all  Social Connections: Moderately Integrated   Frequency of Communication with Friends and Family: More than three times a week   Frequency of Social Gatherings with Friends and Family: More than three times a week   Attends Religious Services: More than 4 times per year   Active Member of Clubs or Organizations: No   Attends Music therapist: More than 4 times per year   Marital Status: Widowed     Family History: The patient's ***family history includes Colon cancer in her maternal grandmother; Colon polyps in her mother; Diabetes in her brother; Early death in her father; Hashimoto's thyroiditis in her sister; Heart disease in her father; Hypertension in her brother and sister; Non-Hodgkin's lymphoma in her sister; Stomach cancer in her maternal aunt. There is no history of Alcohol abuse, COPD, Depression, Drug abuse, Hearing loss, Hyperlipidemia, Kidney disease, Stroke, Esophageal cancer, or Rectal cancer.  ROS:   Please see the history of present illness.    *** All other systems reviewed and are negative.  EKGs/Labs/Other Studies Reviewed:    The following studies were reviewed today: TTE 2020-05-15: IMPRESSIONS     1. No left ventricular thrombus is seen. Left ventricular ejection  fraction, by estimation, is 40 to 45%. Left ventricular ejection fraction  is 43% by biplane modified Simpson's method. The left ventricle has mildly  decreased function. The left  ventricle demonstrates global hypokinesis. The left ventricular internal  cavity size was mildly dilated. Left ventricular diastolic parameters are  consistent with Grade I diastolic dysfunction (impaired relaxation).   2. Right ventricular systolic function is normal. The right ventricular  size is normal.   3. The mitral valve is normal in structure. No evidence of mitral valve  regurgitation. No evidence of mitral stenosis.   4. The aortic valve is  normal in structure. Aortic valve regurgitation is  not visualized. No aortic stenosis is present.   5. The inferior vena cava is normal in size with greater than 50%  respiratory variability, suggesting right atrial pressure of 3 mmHg.   Comparison(s): Prior images reviewed side by side. The left ventricular  function has improved.   ABIs 11/2019 Normal   Echocardiogram 04/27/20 EF 40-45, Gr 1 DD, normal RVSF   Echocardiogram 12/25/19 EF 30-35, normal RVSF, trivial MR   Echocardiogram 04/02/2018 EF 35-40, moderate diffuse HK, grade 1 diastolic dysfunction, mildly calcified aortic valve leaflets, atrial septal lipomatous hypertrophy, trivial PI   ABIs 10/31/2016 Normal    Echocardiogram 04/10/2016 EF 35-40   Cardiac catheterization 10/28/2014 LAD mild diffuse disease RI irregularities LCx ostial 50, mid 25 RCA mild  disease; RPDA 60 (small vessel) EF 35-45, LVEDP 18    EKG:  EKG is *** ordered today.  The ekg ordered today demonstrates ***  Recent Labs: 03/02/2021: ALT 17 05/01/2021: TSH 2.980 05/17/2021: BUN 19; Creatinine, Ser 1.23; Hemoglobin 10.6; Platelets 244.0; Potassium 4.2; Sodium 142  Recent Lipid Panel    Component Value Date/Time   CHOL 156 02/14/2021 0856   TRIG 101.0 02/14/2021 0856   HDL 48.90 02/14/2021 0856   CHOLHDL 3 02/14/2021 0856   VLDL 20.2 02/14/2021 0856   LDLCALC 87 02/14/2021 0856   LDLDIRECT 162.5 01/30/2013 1602     Risk Assessment/Calculations:   {Does this patient have ATRIAL FIBRILLATION?:[phone]}       Physical Exam:    VS:  LMP 05/29/1991     Wt Readings from Last 3 Encounters:  05/17/21 225 lb (102.1 kg)  05/15/21 226 lb (102.5 kg)  05/01/21 221 lb (100.2 kg)     GEN: *** Well nourished, well developed in no acute distress HEENT: Normal NECK: No JVD; No carotid bruits LYMPHATICS: No lymphadenopathy CARDIAC: ***RRR, no murmurs, rubs, gallops RESPIRATORY:  Clear to auscultation without rales, wheezing or rhonchi   ABDOMEN: Soft, non-tender, non-distended MUSCULOSKELETAL:  No edema; No deformity  SKIN: Warm and dry NEUROLOGIC:  Alert and oriented x 3 PSYCHIATRIC:  Normal affect   ASSESSMENT:    No diagnosis found. PLAN:    In order of problems listed above:  #HFrEF: #NICM: LVEF initially 30-35% which improved 40-45% in 2021. Currently doing well with NYHA class II symptoms. -Continue coreg 6.25mg  BID -Continue entresto 97-103mg  BID -Continue farxiga 10mg  daily -Continue spiro 25mg  daily  #HTN: -Continue coreg 6.25mg  BID -Continue entresto 97-103mg  BID -Continue spiro 25mg  daily  #DMII -Continue insulin  #HLD: -Continue lipitor 40mg  daily -Continue trulicity  #Mild-to-moderate coronary artery disease: -Continue ASA 81mg  daily -Continue lipitor 40mg  daily      {Are you ordering a CV Procedure (e.g. stress test, cath, DCCV, TEE, etc)?   Press F2        :[id number]    Medication Adjustments/Labs and Tests Ordered: Current medicines are reviewed at length with the patient today.  Concerns regarding medicines are outlined above.  No orders of the defined types were placed in this encounter.  No orders of the defined types were placed in this encounter.   There are no Patient Instructions on file for this visit.   Signed, Freada Bergeron, MD  07/12/2021 6:51 AM    [place]

## 2021-07-14 ENCOUNTER — Encounter: Payer: Self-pay | Admitting: Cardiology

## 2021-07-14 ENCOUNTER — Ambulatory Visit (INDEPENDENT_AMBULATORY_CARE_PROVIDER_SITE_OTHER): Payer: Medicare Other | Admitting: Cardiology

## 2021-07-14 ENCOUNTER — Other Ambulatory Visit: Payer: Self-pay

## 2021-07-14 VITALS — BP 134/80 | HR 95 | Ht 64.0 in | Wt 221.8 lb

## 2021-07-14 DIAGNOSIS — I25118 Atherosclerotic heart disease of native coronary artery with other forms of angina pectoris: Secondary | ICD-10-CM

## 2021-07-14 DIAGNOSIS — E118 Type 2 diabetes mellitus with unspecified complications: Secondary | ICD-10-CM | POA: Diagnosis not present

## 2021-07-14 DIAGNOSIS — E782 Mixed hyperlipidemia: Secondary | ICD-10-CM

## 2021-07-14 DIAGNOSIS — I5022 Chronic systolic (congestive) heart failure: Secondary | ICD-10-CM

## 2021-07-14 DIAGNOSIS — I10 Essential (primary) hypertension: Secondary | ICD-10-CM

## 2021-07-14 DIAGNOSIS — I428 Other cardiomyopathies: Secondary | ICD-10-CM

## 2021-07-14 NOTE — Progress Notes (Signed)
Cardiology Office Note:    Date:  07/14/2021   ID:  Emily Wheeler, DOB [birth year], MRN [id number]  PCP:  [name], MD   [facility] Providers Cardiologist:  Ena Dawley, MD {   Referring MD: [name], MD    History of Present Illness:    Emily Wheeler is a 66 y.o. female with a hx of HFrEF with improved EF, mild-to-mod coronary artery disease, DMII, RA, HTN and HLD who now presents to clinic for follow-up.  She has a history of nonischemic cardiomyopathy that was diagnosed in 2016. She initially presented with a complaint of dyspnea. Echocardiogram in April 2016 showed reduced EF at 35-40%. She initially underwent a nuclear stress test which was abnormal, leading to cardiac catheterization. Cardiac cath in June 2016 showed mild CAD thus a diagnosis of nonischemic cardiomyopathy was made. TTE 04/10/2016 showed reduced left ventricular ejection fraction at 35-40%. Grade 1 diastolic dysfunction was also noted with diffuse hypokinesis. No valvular abnormalities were noted.  Repeat TTE 04/2020 with EF 40-45%.  Last saw Emily Wheeler 10/2019 where she was doing well. No HF symptoms.  Today, the patient states that she is feeling okay aside from some occasional shortness of breath. This is an ongoing symptom that she states may be worsening. Her SOB usually occurs when walking or climbing stairs. She denies any chest pain or LE edema.  At home her blood pressure is generally well controlled. Last week at another clinic visit it was reportedly 989 systolic.  She has had 2 prior back surgeries, and currently suffers from chronic back pain as well as bursitis in her hips bilaterally. These symptoms limit her formal exercise significantly.  Lately she has lost 3-4 lbs by working on dietary changes. She has some difficulty with weight loss due to receiving steroidal injections for her pain management.  She denies any palpitations, lightheadedness, headaches, syncope,  orthopnea, or PND.  In her family, her father died at 55 yo from heart failure. Her paternal uncles also died from heart failure at an early age.  Past Medical History:  Diagnosis Date   Anemia    mild   Anxiety    Arthritis    Asthma    Cardiomyopathy ([place])    CHF (congestive heart failure) (HCC)    Diabetes mellitus without complication (HCC)    Frequency of urination    at night   GERD (gastroesophageal reflux disease)    potassium worsens reflux   Hemorrhoids, external    Hyperlipidemia    Hypertension    Neuromuscular disorder (HCC)    neuropathy   Numbness and tingling of both legs    Rheumatoid arthritis ([place])    Vertigo    hx of    Past Surgical History:  Procedure Laterality Date   ABDOMINAL HYSTERECTOMY     CARDIAC CATHETERIZATION N/A 10/28/2014   Procedure: Left Heart Cath and Coronary Angiography;  Surgeon: Jettie Booze, MD;  Location: [facility];  Service: Cardiovascular;  Laterality: N/A;   CARPAL TUNNEL RELEASE Left    COLONOSCOPY     pt states 11 yr ago in [place] had a colon with 2 polyps- one cecal polyp per pt. one ? location   COLONOSCOPY W/ POLYPECTOMY     CYST REMOVAL NECK     HEMORROIDECTOMY     MAXIMUM ACCESS (MAS)POSTERIOR LUMBAR INTERBODY FUSION (PLIF) 3 LEVEL N/A 10/08/2013   Procedure: FOR MAXIMUM ACCESS (MAS) POSTERIOR LUMBAR INTERBODY FUSION (PLIF) 3 LEVEL;  Surgeon: Erline Levine, MD;  Location: [facility];  Service: Neurosurgery;  Laterality: N/A;  L3-4 L4-5 L5-S1 maximum access posterior lumbar interbody fusion with decompression   TUBAL LIGATION      Current Medications: Current Meds  Medication Sig   Accu-Chek FastClix Lancets MISC USE 1  TO CHECK GLUCOSE THREE TIMES DAILY   allopurinol (ZYLOPRIM) 300 MG tablet Take 1 tablet by mouth once daily   aspirin EC 81 MG tablet Take 1 tablet (81 mg total) by mouth daily.   atorvastatin (LIPITOR) 40 MG tablet Take 1 tablet by mouth once daily   Biotin 10 MG TABS Take by mouth.    Blood Glucose Monitoring Suppl (ACCU-CHEK GUIDE) w/Device KIT 1 Act by Does not apply route 3 (three) times daily.   Calcium Carb-Cholecalciferol (CALCIUM 600 + D PO) Take 1 tablet by mouth daily.   carvedilol (COREG) 6.25 MG tablet Take 1 tablet (6.25 mg total) by mouth 2 (two) times daily with a meal.   Cinnamon 500 MG TABS Take by mouth.   citalopram (CELEXA) 20 MG tablet Take 1 tablet by mouth once daily   dapagliflozin propanediol (FARXIGA) 10 MG TABS tablet Take 1 tablet (10 mg total) by mouth daily before breakfast.   DEXILANT 60 MG capsule Take 1 capsule (60 mg total) by mouth daily.   gabapentin (NEURONTIN) 600 MG tablet TAKE 1 TABLET BY MOUTH THREE TIMES DAILY   glucose blood (ACCU-CHEK GUIDE) test strip USE 1 STRIP TO TEST GLUCOSE THREE TIMES DAILY   Lancets Misc. (ACCU-CHEK FASTCLIX LANCET) KIT Inject 1 Act into the skin 3 (three) times daily.   leflunomide (ARAVA) 20 MG tablet Take 1 tablet (20 mg total) by mouth daily.   Omega-3 Fatty Acids (FISH OIL) 1200 MG CAPS Take 1,200 mg by mouth daily.    PROAIR RESPICLICK 195 (90 Base) MCG/ACT AEPB INHALE 2 PUFFS BY MOUTH 4 TIMES DAILY AS NEEDED   sacubitril-valsartan (ENTRESTO) 97-103 MG Take 1 tablet by mouth 2 (two) times daily. Please make yearly appt with Dr. Johney Frame new Cardiologist for December 2022 for future refills. Thank you 1st attempt   spironolactone (ALDACTONE) 25 MG tablet Take 0.5 tablets (12.5 mg total) by mouth daily.   TRULICITY 0.93 OI/7.1IW SOPN INJECT 1 SYRINGE SUBCUTANEOUSLY ONCE A WEEK     Allergies:   Lisinopril   Social History   Socioeconomic History   Marital status: Single    Spouse name: Not on file   Number of children: Not on file   Years of education: Not on file   Highest education level: Not on file  Occupational History   Not on file  Tobacco Use   Smoking status: Former   Smokeless tobacco: Never  Vaping Use   Vaping Use: Never used  Substance and Sexual Activity   Alcohol use: Yes     Comment: 3 x a year -rare    Drug use: No    Types: Marijuana   Sexual activity: Not Currently  Other Topics Concern   Not on file  Social History Narrative   Not on file   Social Determinants of Health   Financial Resource Strain: Low Risk    Difficulty of Paying Living Expenses: Not hard at all  Food Insecurity: No Food Insecurity   Worried About Charity fundraiser in the Last Year: Never true   Ran Out of Food in the Last Year: Never true  Transportation Needs: No Transportation Needs   Lack of Transportation (Medical):  No   Lack of Transportation (Non-Medical): No  Physical Activity: Sufficiently Active   Days of Exercise per Week: 5 days   Minutes of Exercise per Session: 30 min  Stress: No Stress Concern Present   Feeling of Stress : Not at all  Social Connections: Moderately Integrated   Frequency of Communication with Friends and Family: More than three times a week   Frequency of Social Gatherings with Friends and Family: More than three times a week   Attends Religious Services: More than 4 times per year   Active Member of Clubs or Organizations: No   Attends Music therapist: More than 4 times per year   Marital Status: Widowed     Family History: The patient's family history includes Colon cancer in her maternal grandmother; Colon polyps in her mother; Diabetes in her brother; Early death in her father; Hashimoto's thyroiditis in her sister; Heart disease in her father; Hypertension in her brother and sister; Non-Hodgkin's lymphoma in her sister; Stomach cancer in her maternal aunt. There is no history of Alcohol abuse, COPD, Depression, Drug abuse, Hearing loss, Hyperlipidemia, Kidney disease, Stroke, Esophageal cancer, or Rectal cancer.  ROS:   Review of Systems  Constitutional:  Negative for chills and fever.  HENT:  Negative for ear pain and nosebleeds.   Eyes:  Negative for blurred vision.  Respiratory:  Positive for shortness of breath.  Negative for cough and sputum production.   Cardiovascular:  Negative for chest pain, palpitations, orthopnea, claudication, leg swelling and PND.  Gastrointestinal:  Negative for nausea and vomiting.  Genitourinary:  Negative for dysuria.  Musculoskeletal:  Positive for back pain and joint pain. Negative for neck pain.  Neurological:  Negative for dizziness and weakness.  Endo/Heme/Allergies:  Negative for polydipsia.  Psychiatric/Behavioral:  Negative for hallucinations and substance abuse.     EKGs/Labs/Other Studies Reviewed:    The following studies were reviewed today:  Right LE Venous Doppler 03/03/2021: Summary:  RIGHT:  - No evidence of deep vein thrombosis in the lower extremity. No indirect  evidence of obstruction proximal to the inguinal ligament.  - No cystic structure found in the popliteal fossa.  - 0.17 cm x 0.17 cm knot behind knee with no color flow; possible lipoma     LEFT:  - No evidence of common femoral vein obstruction.  TTE 04/2020: IMPRESSIONS    1. No left ventricular thrombus is seen. Left ventricular ejection  fraction, by estimation, is 40 to 45%. Left ventricular ejection fraction  is 43% by biplane modified Simpson's method. The left ventricle has mildly  decreased function. The left  ventricle demonstrates global hypokinesis. The left ventricular internal  cavity size was mildly dilated. Left ventricular diastolic parameters are  consistent with Grade I diastolic dysfunction (impaired relaxation).   2. Right ventricular systolic function is normal. The right ventricular  size is normal.   3. The mitral valve is normal in structure. No evidence of mitral valve  regurgitation. No evidence of mitral stenosis.   4. The aortic valve is normal in structure. Aortic valve regurgitation is  not visualized. No aortic stenosis is present.   5. The inferior vena cava is normal in size with greater than 50%  respiratory variability, suggesting right  atrial pressure of 3 mmHg.   Comparison(s): Prior images reviewed side by side. The left ventricular  function has improved.   ABIs 11/2019 Normal   Echocardiogram 04/27/20 EF 40-45, Gr 1 DD, normal RVSF   Echocardiogram 12/25/19 EF  30-35, normal RVSF, trivial MR   Echocardiogram 04/02/2018 EF 35-40, moderate diffuse HK, grade 1 diastolic dysfunction, mildly calcified aortic valve leaflets, atrial septal lipomatous hypertrophy, trivial PI   ABIs 10/31/2016 Normal    Echocardiogram 04/10/2016 EF 35-40   Cardiac catheterization 10/28/2014 LAD mild diffuse disease RI irregularities LCx ostial 50, mid 25 RCA mild disease; RPDA 60 (small vessel) EF 35-45, LVEDP 18    EKG:  EKG is personally reviewed. 07/14/2021: Sinus rhythm. Rate 95 bpm.   Recent Labs: 03/02/2021: ALT 17 05/01/2021: TSH 2.980 05/17/2021: BUN 19; Creatinine, Ser 1.23; Hemoglobin 10.6; Platelets 244.0; Potassium 4.2; Sodium 142   Recent Lipid Panel    Component Value Date/Time   CHOL 156 02/14/2021 0856   TRIG 101.0 02/14/2021 0856   HDL 48.90 02/14/2021 0856   CHOLHDL 3 02/14/2021 0856   VLDL 20.2 02/14/2021 0856   LDLCALC 87 02/14/2021 0856   LDLDIRECT 162.5 01/30/2013 1602     Risk Assessment/Calculations:           Physical Exam:    VS:  BP 134/80    Pulse 95    Ht _0  (1.626 m)    Wt 221 lb 12.8 oz (100.6 kg)    LMP 05/29/1991    SpO2 95%    BMI 38.07 kg/m     Wt Readings from Last 3 Encounters:  07/14/21 221 lb 12.8 oz (100.6 kg)  05/17/21 225 lb (102.1 kg)  05/15/21 226 lb (102.5 kg)     GEN: Well nourished, well developed in no acute distress HEENT: Normal NECK: No JVD; No carotid bruits CARDIAC: RRR, no murmurs, rubs, gallops RESPIRATORY:  Clear to auscultation without rales, wheezing or rhonchi  ABDOMEN: Soft, non-tender, non-distended MUSCULOSKELETAL:  No edema; No deformity  SKIN: Warm and dry NEUROLOGIC:  Alert and oriented x 3 PSYCHIATRIC:  Normal affect   ASSESSMENT:     1. Chronic systolic heart failure ([place])   2. Essential hypertension, benign   3. NICM (nonischemic cardiomyopathy) ([place])   4. Type II diabetes mellitus with manifestations ([place])   5. Coronary artery disease of native artery of native heart with stable angina pectoris ([place])   6. Mixed hyperlipidemia    PLAN:    In order of problems listed above:  #Chronic Systolic Heart Failure #NICM: LVEF initially 30-35% which improved 40-45% in 2021. Currently doing well with NYHA class II symptoms. -Declined TTE today; may be willing to do next visit -Continue coreg 6.28m BID -Continue entresto 97-1066mBID -Continue farxiga 1072maily -Continue spiro 81m73mily  #HTN: Well controlled and at goal. -Continue coreg 6.81mg52m -Continue entresto 97-103mg 26m-Continue spiro 81mg d81m  #DMII -Continue insulin -Continue trulicity  #HLD: -Continue lipitor 40mg da40m #Mild-to-moderate coronary artery disease: Now with mild dyspnea on exertion. Denies chest pain. Offered coronary CTA today but she would like to wait. Will call back if symptoms worsening. -Continue ASA 81mg dai19mContinue lipitor 40mg dail12montinue coreg 6.81mg BID -37mined coronary CTA; may consider in the future if SOB worsening         Follow-up in 6 months.  Medication Adjustments/Labs and Tests Ordered: Current medicines are reviewed at length with the patient today.  Concerns regarding medicines are outlined above.   Orders Placed This Encounter  Procedures   EKG 12-Lead   No orders of the defined types were placed in this encounter.  Patient Instructions  Medication Instructions:   Your physician recommends that you continue on your current medications as  directed. Please refer to the Current Medication list given to you today.  *If you need a refill on your cardiac medications before your next appointment, please call your pharmacy*   Follow-Up: At [facility], you and your health needs  are our priority.  As part of our continuing mission to provide you with exceptional heart care, we have created designated Provider Care Teams.  These Care Teams include your primary Cardiologist (physician) and Advanced Practice Providers (APPs -  Physician Assistants and Nurse Practitioners) who all work together to provide you with the care you need, when you need it.  We recommend signing up for the patient portal called "MyChart".  Sign up information is provided on this After Visit Summary.  MyChart is used to connect with patients for Virtual Visits (Telemedicine).  Patients are able to view lab/test results, encounter notes, upcoming appointments, etc.  Non-urgent messages can be sent to your provider as well.   To learn more about what you can do with MyChart, go to [email or web address].    Your next appointment:   6 month(s)  The format for your next appointment:   In Person  Provider:   DR. Johney Frame OR AN EXTENDER    I,Mathew Stumpf,acting as a scribe for Freada Bergeron, MD.,have documented all relevant documentation on the behalf of Freada Bergeron, MD,as directed by  Freada Bergeron, MD while in the presence of Freada Bergeron, MD.  I, Freada Bergeron, MD, have reviewed all documentation for this visit. The documentation on 07/14/21 for the exam, diagnosis, procedures, and orders are all accurate and complete.   Signed, Freada Bergeron, MD  07/14/2021 10:57 AM    [place]

## 2021-07-14 NOTE — Patient Instructions (Signed)
Medication Instructions:   Your physician recommends that you continue on your current medications as directed. Please refer to the Current Medication list given to you today.  *If you need a refill on your cardiac medications before your next appointment, please call your pharmacy*   Follow-Up: At [facility], you and your health needs are our priority.  As part of our continuing mission to provide you with exceptional heart care, we have created designated Provider Care Teams.  These Care Teams include your primary Cardiologist (physician) and Advanced Practice Providers (APPs -  Physician Assistants and Nurse Practitioners) who all work together to provide you with the care you need, when you need it.  We recommend signing up for the patient portal called "MyChart".  Sign up information is provided on this After Visit Summary.  MyChart is used to connect with patients for Virtual Visits (Telemedicine).  Patients are able to view lab/test results, encounter notes, upcoming appointments, etc.  Non-urgent messages can be sent to your provider as well.   To learn more about what you can do with MyChart, go to [email or web address].    Your next appointment:   6 month(s)  The format for your next appointment:   In Person  Provider:   DR. Shari Prows OR AN EXTENDER

## 2021-07-18 ENCOUNTER — Telehealth: Payer: Self-pay

## 2021-07-18 ENCOUNTER — Other Ambulatory Visit: Payer: Self-pay | Admitting: Internal Medicine

## 2021-07-18 DIAGNOSIS — E118 Type 2 diabetes mellitus with unspecified complications: Secondary | ICD-10-CM

## 2021-07-18 MED ORDER — ACCU-CHEK GUIDE W/DEVICE KIT: 1.0000 | PACK | Freq: Three times a day (TID) | 2 refills | Status: AC

## 2021-07-18 MED ORDER — ACCU-CHEK GUIDE VI STRP: 1.0000 | ORAL_STRIP | Freq: Three times a day (TID) | 1 refills | Status: AC

## 2021-07-18 NOTE — Telephone Encounter (Signed)
Pt is requesting a refill for: Blood Glucose Monitoring Suppl (ACCU-CHEK GUIDE) w/Device KIT  Pharmacy: [facility] [place], [place] 05/17/21  [phone]

## 2021-07-27 ENCOUNTER — Ambulatory Visit (INDEPENDENT_AMBULATORY_CARE_PROVIDER_SITE_OTHER): Payer: Medicare Other

## 2021-07-27 VITALS — Ht 64.0 in | Wt 223.0 lb

## 2021-07-27 DIAGNOSIS — Z0000 Encounter for general adult medical examination without abnormal findings: Secondary | ICD-10-CM | POA: Diagnosis not present

## 2021-07-27 DIAGNOSIS — Z13820 Encounter for screening for osteoporosis: Secondary | ICD-10-CM | POA: Diagnosis not present

## 2021-07-27 NOTE — Progress Notes (Signed)
I connected with Berit Gockley today by telephone and verified that I am speaking with the correct person using two identifiers. Location patient: home Location provider: work Persons participating in the virtual visit: Elcie Darnetta, [place] LPN.   I discussed the limitations, risks, security and privacy concerns of performing an evaluation and management service by telephone and the availability of in person appointments. I also discussed with the patient that there may be a patient responsible charge related to this service. The patient expressed understanding and verbally consented to this telephonic visit.    Interactive audio and video telecommunications were attempted between this provider and patient, however failed, due to patient having technical difficulties OR patient did not have access to video capability.  We continued and completed visit with audio only.     Vital signs may be patient reported or missing.  Subjective:   Emily Wheeler is a 66 y.o. female who presents for Medicare Annual (Subsequent) preventive examination.  Review of Systems     Cardiac Risk Factors include: advanced age (>27mn, >>40women);diabetes mellitus;dyslipidemia;hypertension;obesity (BMI >30kg/m2)     Objective:    Today's Vitals   07/27/21 0936  Weight: 223 lb (101.2 kg)  Height: 5' 4"  (1.626 m)   Body mass index is 38.28 kg/m.  Advanced Directives 07/27/2021 07/19/2020 03/31/2016 10/10/2015 09/26/2015 10/28/2014 10/08/2013  Does Patient Have a Medical Advance Directive? No No Yes Yes Yes No Patient does not have advance directive;Patient would not like information  Type of Advance Directive - - [place] will - -  Does patient want to make changes to medical advance directive? - - No - Patient declined - - - -  Copy of [place] Chart? - - Yes - - - -  Would  patient like information on creating a medical advance directive? - No - Patient declined - - - No - patient declined information -    Current Medications (verified) Outpatient Encounter Medications as of 07/27/2021  Medication Sig   Accu-Chek FastClix Lancets MISC USE 1  TO CHECK GLUCOSE THREE TIMES DAILY   allopurinol (ZYLOPRIM) 300 MG tablet Take 1 tablet by mouth once daily   aspirin EC 81 MG tablet Take 1 tablet (81 mg total) by mouth daily.   atorvastatin (LIPITOR) 40 MG tablet Take 1 tablet by mouth once daily   Biotin 10 MG TABS Take by mouth.   Blood Glucose Monitoring Suppl (ACCU-CHEK GUIDE) w/Device KIT 1 Act by Does not apply route 3 (three) times daily.   Calcium Carb-Cholecalciferol (CALCIUM 600 + D PO) Take 1 tablet by mouth daily.   carvedilol (COREG) 6.25 MG tablet Take 1 tablet (6.25 mg total) by mouth 2 (two) times daily with a meal.   Cinnamon 500 MG TABS Take by mouth.   citalopram (CELEXA) 20 MG tablet Take 1 tablet by mouth once daily   dapagliflozin propanediol (FARXIGA) 10 MG TABS tablet Take 1 tablet (10 mg total) by mouth daily before breakfast.   DEXILANT 60 MG capsule Take 1 capsule (60 mg total) by mouth daily.   gabapentin (NEURONTIN) 600 MG tablet TAKE 1 TABLET BY MOUTH THREE TIMES DAILY   glucose blood (ACCU-CHEK GUIDE) test strip 1 each by Other route 3 (three) times daily.   Lancets Misc. (ACCU-CHEK FASTCLIX LANCET) KIT Inject 1 Act into the skin 3 (three) times daily.   Omega-3 Fatty Acids (FISH OIL) 1200 MG  CAPS Take 1,200 mg by mouth daily.    PROAIR RESPICLICK 174 (90 Base) MCG/ACT AEPB INHALE 2 PUFFS BY MOUTH 4 TIMES DAILY AS NEEDED   sacubitril-valsartan (ENTRESTO) 97-103 MG Take 1 tablet by mouth 2 (two) times daily. Please make yearly appt with Dr. Johney Frame new Cardiologist for December 2022 for future refills. Thank you 1st attempt   spironolactone (ALDACTONE) 25 MG tablet Take 0.5 tablets (12.5 mg total) by mouth daily.   TRULICITY 0.81 KG/8.1EH  SOPN INJECT 1 SYRINGE SUBCUTANEOUSLY ONCE A WEEK   leflunomide (ARAVA) 20 MG tablet Take 1 tablet (20 mg total) by mouth daily.   No facility-administered encounter medications on file as of 07/27/2021.    Allergies (verified) Lisinopril   History: Past Medical History:  Diagnosis Date   Anemia    mild   Anxiety    Arthritis    Asthma    Cardiomyopathy ([place])    CHF (congestive heart failure) (HCC)    Diabetes mellitus without complication (HCC)    Frequency of urination    at night   GERD (gastroesophageal reflux disease)    potassium worsens reflux   Hemorrhoids, external    Hyperlipidemia    Hypertension    Neuromuscular disorder (HCC)    neuropathy   Numbness and tingling of both legs    Rheumatoid arthritis ([place])    Vertigo    hx of   Past Surgical History:  Procedure Laterality Date   ABDOMINAL HYSTERECTOMY     CARDIAC CATHETERIZATION N/A 10/28/2014   Procedure: Left Heart Cath and Coronary Angiography;  Surgeon: Jettie Booze, MD;  Location: [facility];  Service: Cardiovascular;  Laterality: N/A;   CARPAL TUNNEL RELEASE Left    COLONOSCOPY     pt states 11 yr ago in [place] had a colon with 2 polyps- one cecal polyp per pt. one ? location   COLONOSCOPY W/ POLYPECTOMY     CYST REMOVAL NECK     HEMORROIDECTOMY     MAXIMUM ACCESS (MAS)POSTERIOR LUMBAR INTERBODY FUSION (PLIF) 3 LEVEL N/A 10/08/2013   Procedure: FOR MAXIMUM ACCESS (MAS) POSTERIOR LUMBAR INTERBODY FUSION (PLIF) 3 LEVEL;  Surgeon: Erline Levine, MD;  Location: [facility];  Service: Neurosurgery;  Laterality: N/A;  L3-4 L4-5 L5-S1 maximum access posterior lumbar interbody fusion with decompression   TUBAL LIGATION     Family History  Problem Relation Age of Onset   Early death Father    Heart disease Father    Hypertension Sister    Hypertension Brother    Diabetes Brother    Colon polyps Mother    Hashimoto's thyroiditis Sister    Colon cancer Maternal Grandmother        in her 62s    Stomach cancer Maternal Aunt    Non-Hodgkin's lymphoma Sister    Alcohol abuse Neg Hx    COPD Neg Hx    Depression Neg Hx    Drug abuse Neg Hx    Hearing loss Neg Hx    Hyperlipidemia Neg Hx    Kidney disease Neg Hx    Stroke Neg Hx    Esophageal cancer Neg Hx    Rectal cancer Neg Hx    Social History   Socioeconomic History   Marital status: Single    Spouse name: Not on file   Number of children: Not on file   Years of education: Not on file   Highest education level: Not on file  Occupational History   Not on  file  Tobacco Use   Smoking status: Former    Passive exposure: Current   Smokeless tobacco: Never  Vaping Use   Vaping Use: Never used  Substance and Sexual Activity   Alcohol use: Not Currently   Drug use: No    Types: Marijuana   Sexual activity: Not Currently  Other Topics Concern   Not on file  Social History Narrative   Not on file   Social Determinants of Health   Financial Resource Strain: Low Risk    Difficulty of Paying Living Expenses: Not hard at all  Food Insecurity: No Food Insecurity   Worried About Charity fundraiser in the Last Year: Never true   [place] in the Last Year: Never true  Transportation Needs: No Transportation Needs   Lack of Transportation (Medical): No   Lack of Transportation (Non-Medical): No  Physical Activity: Inactive   Days of Exercise per Week: 0 days   Minutes of Exercise per Session: 0 min  Stress: No Stress Concern Present   Feeling of Stress : Not at all  Social Connections: Not on file    Tobacco Counseling Counseling given: Not Answered   Clinical Intake:  Pre-visit preparation completed: Yes  Pain : No/denies pain     Nutritional Status: BMI > 30  Obese Nutritional Risks: None Diabetes: Yes  How often do you need to have someone help you when you read instructions, pamphlets, or other written materials from your doctor or pharmacy?: 1 - Never What is the last grade level you  completed in school?: GED  Diabetic? Yes Nutrition Risk Assessment:  Has the patient had any N/V/D within the last 2 months?  No  Does the patient have any non-healing wounds?  No  Has the patient had any unintentional weight loss or weight gain?  No   Diabetes:  Is the patient diabetic?  Yes  If diabetic, was a CBG obtained today?  No  Did the patient bring in their glucometer from home?  No  How often do you monitor your CBG's? daily.   Financial Strains and Diabetes Management:  Are you having any financial strains with the device, your supplies or your medication? No .  Does the patient want to be seen by Chronic Care Management for management of their diabetes?  No  Would the patient like to be referred to a Nutritionist or for Diabetic Management?  No   Diabetic Exams:  Diabetic Eye Exam: Overdue for diabetic eye exam. Pt has been advised about the importance in completing this exam. Patient advised to call and schedule an eye exam. Diabetic Foot Exam: Completed 08/24/2020   Interpreter Needed?: No  Information entered by :: NAllen LPN   Activities of Daily Living In your present state of health, do you have any difficulty performing the following activities: 07/27/2021  Hearing? N  Vision? N  Difficulty concentrating or making decisions? N  Walking or climbing stairs? Y  Dressing or bathing? N  Doing errands, shopping? N  Preparing Food and eating ? N  Using the Toilet? N  In the past six months, have you accidently leaked urine? Y  Comment with a cough or sneeze  Do you have problems with loss of bowel control? N  Managing your Medications? N  Managing your Finances? N  Housekeeping or managing your Housekeeping? N  Some recent data might be hidden    Patient Care Team: [name], MD as PCP - General (Internal  Medicine) Dorothy Spark, MD as PCP - Cardiology (Cardiology) Opthamology, [facility] as Consulting Physician (Ophthalmology) Charlton Haws, [facility] as Pharmacist (Pharmacist)  Indicate any recent Medical Services you may have received from other than [facility] providers in the past year (date may be approximate).     Assessment:   This is a routine wellness examination for [place].  Hearing/Vision screen Vision Screening - Comments:: Regular eye exams, [facility]  Dietary issues and exercise activities discussed: Current Exercise Habits: The patient does not participate in regular exercise at present   Goals Addressed             This Visit's Progress    Patient Stated       07/27/2021, no goals       Depression Screen PHQ 2/9 Scores 07/27/2021 07/19/2020 04/18/2020 02/03/2019 09/19/2017 03/31/2016 01/31/2013  PHQ - 2 Score 0 0 0 0 0 0 0  PHQ- 9 Score - - - 2 0 - -    Fall Risk Fall Risk  07/27/2021 07/19/2020 04/18/2020 02/03/2019 03/31/2016  Falls in the past year? 0 0 0 0 No  Number falls in past yr: - 0 - 0 -  Injury with Fall? - 0 - 0 -  Risk for fall due to : Medication side effect No Fall Risks - - -  Follow up Falls evaluation completed;Education provided;Falls prevention discussed - - Falls evaluation completed -    FALL RISK PREVENTION PERTAINING TO THE HOME:  Any stairs in or around the home? Yes  If so, are there any without handrails? Yes  Home free of loose throw rugs in walkways, pet beds, electrical cords, etc? Yes  Adequate lighting in your home to reduce risk of falls? Yes   ASSISTIVE DEVICES UTILIZED TO PREVENT FALLS:  Life alert? No  Use of a cane, walker or w/c? No  Grab bars in the bathroom? No  Shower chair or bench in shower? No  Elevated toilet seat or a handicapped toilet? No   TIMED UP AND GO:  Was the test performed? No .       Cognitive Function:     6CIT Screen 07/27/2021  What Year? 0 points  What month? 0 points  What time? 0 points  Count back from 20 0 points  Months in reverse 0 points  Repeat phrase 0 points  Total Score 0    Immunizations Immunization  History  Administered Date(s) Administered   Fluad Quad(high Dose 65+) 04/18/2020, 02/14/2021   Influenza, Quadrivalent, Recombinant, Inj, Pf 04/03/2018   Influenza,inj,Quad PF,6+ Mos 01/30/2013, 02/08/2014, 03/09/2015, 03/30/2016, 02/19/2017, 01/22/2019   PFIZER(Purple Top)SARS-COV-2 Vaccination 08/08/2019, 09/02/2019, 05/18/2020   Pneumococcal Polysaccharide-23 08/13/2017   Pneumococcal-Unspecified 05/29/2011   Tdap 01/10/2011    TDAP status: Due, Education has been provided regarding the importance of this vaccine. Advised may receive this vaccine at local pharmacy or Health Dept. Aware to provide a copy of the vaccination record if obtained from local pharmacy or Health Dept. Verbalized acceptance and understanding.  Flu Vaccine status: Up to date  Pneumococcal vaccine status: Up to date  Covid-19 vaccine status: Completed vaccines  Qualifies for Shingles Vaccine? Yes   Zostavax completed No   Shingrix Completed?: No.    Education has been provided regarding the importance of this vaccine. Patient has been advised to call insurance company to determine out of pocket expense if they have not yet received this vaccine. Advised may also receive vaccine at local pharmacy or Health Dept. Verbalized acceptance and  understanding.  Screening Tests Health Maintenance  Topic Date Due   Zoster Vaccines- Shingrix (1 of 2) Never done   Pneumonia Vaccine 4+ Years old (2 - PCV) 08/14/2018   DEXA SCAN  Never done   OPHTHALMOLOGY EXAM  06/29/2020   COVID-19 Vaccine (4 - Booster for Pfizer series) 07/13/2020   TETANUS/TDAP  01/09/2021   FOOT EXAM  08/24/2021   HEMOGLOBIN A1C  11/15/2021   MAMMOGRAM  09/09/2022   COLONOSCOPY (Pts 45-38yr Insurance coverage will need to be confirmed)  10/10/2025   INFLUENZA VACCINE  Completed   Hepatitis C Screening  Completed   HPV VACCINES  Aged Out    Health Maintenance  Health Maintenance Due  Topic Date Due   Zoster Vaccines- Shingrix (1 of 2)  Never done   Pneumonia Vaccine 68 Years old (2 - PCV) 08/14/2018   DEXA SCAN  Never done   OPHTHALMOLOGY EXAM  06/29/2020   COVID-19 Vaccine (4 - Booster for Pfizer series) 07/13/2020   TETANUS/TDAP  01/09/2021    Colorectal cancer screening: Type of screening: Colonoscopy. Completed 10/11/2015. Repeat every 10 years  Mammogram status: Completed 09/08/2020. Repeat every year  Bone Density status: Ordered today. Pt provided with contact info and advised to call to schedule appt.  Lung Cancer Screening: (Low Dose CT Chest recommended if Age 37-80years, 30 pack-year currently smoking OR have quit w/in 15years.) does not qualify.   Lung Cancer Screening Referral: no  Additional Screening:  Hepatitis C Screening: does qualify; Completed 10/31/2016  Vision Screening: Recommended annual ophthalmology exams for early detection of glaucoma and other disorders of the eye. Is the patient up to date with their annual eye exam?  No  Who is the provider or what is the name of the office in which the patient attends annual eye exams? GC[facility] CorporationIf pt is not established with a provider, would they like to be referred to a provider to establish care? No .   Dental Screening: Recommended annual dental exams for proper oral hygiene  Community Resource Referral / Chronic Care Management: CRR required this visit?  No   CCM required this visit?  No      Plan:     I have personally reviewed and noted the following in the patients chart:   Medical and social history Use of alcohol, tobacco or illicit drugs  Current medications and supplements including opioid prescriptions.  Functional ability and status Nutritional status Physical activity Advanced directives List of other physicians Hospitalizations, surgeries, and ER visits in previous 12 months Vitals Screenings to include cognitive, depression, and falls Referrals and appointments  In addition, I have reviewed and discussed with  patient certain preventive protocols, quality metrics, and best practice recommendations. A written personalized care plan for preventive services as well as general preventive health recommendations were provided to patient.     NKellie Simmering LPN   [id number]  Nurse Notes: none  Due to this being a virtual visit, the after visit summary with patients personalized plan was offered to patient via mail or my-chart. Patient would like to access on my-chart

## 2021-07-27 NOTE — Patient Instructions (Signed)
Ms. Bjelland , Thank you for taking time to come for your Medicare Wellness Visit. I appreciate your ongoing commitment to your health goals. Please review the following plan we discussed and let me know if I can assist you in the future.   Screening recommendations/referrals: Colonoscopy: completed 10/11/2015, due 10/10/2025 Mammogram: completed 09/08/2020, due 09/09/2021 Bone Density: due Recommended yearly ophthalmology/optometry visit for glaucoma screening and checkup Recommended yearly dental visit for hygiene and checkup  Vaccinations: Influenza vaccine: completed 02/14/2021, due next flu season Pneumococcal vaccine: completed 08/13/2017 Tdap vaccine: due Shingles vaccine: discussed   Covid-19: 05/18/2020, 09/02/2019, 08/08/2019  Advanced directives: Advance directive discussed with you today.   Conditions/risks identified: none  Next appointment: Follow up in one year for your annual wellness visit    Preventive Care 65 Years and Older, Female Preventive care refers to lifestyle choices and visits with your health care provider that can promote health and wellness. What does preventive care include? A yearly physical exam. This is also called an annual well check. Dental exams once or twice a year. Routine eye exams. Ask your health care provider how often you should have your eyes checked. Personal lifestyle choices, including: Daily care of your teeth and gums. Regular physical activity. Eating a healthy diet. Avoiding tobacco and drug use. Limiting alcohol use. Practicing safe sex. Taking low-dose aspirin every day. Taking vitamin and mineral supplements as recommended by your health care provider. What happens during an annual well check? The services and screenings done by your health care provider during your annual well check will depend on your age, overall health, lifestyle risk factors, and family history of disease. Counseling  Your health care provider may ask you  questions about your: Alcohol use. Tobacco use. Drug use. Emotional well-being. Home and relationship well-being. Sexual activity. Eating habits. History of falls. Memory and ability to understand (cognition). Work and work Astronomer. Reproductive health. Screening  You may have the following tests or measurements: Height, weight, and BMI. Blood pressure. Lipid and cholesterol levels. These may be checked every 5 years, or more frequently if you are over 85 years old. Skin check. Lung cancer screening. You may have this screening every year starting at age 3 if you have a 30-pack-year history of smoking and currently smoke or have quit within the past 15 years. Fecal occult blood test (FOBT) of the stool. You may have this test every year starting at age 49. Flexible sigmoidoscopy or colonoscopy. You may have a sigmoidoscopy every 5 years or a colonoscopy every 10 years starting at age 60. Hepatitis C blood test. Hepatitis B blood test. Sexually transmitted disease (STD) testing. Diabetes screening. This is done by checking your blood sugar (glucose) after you have not eaten for a while (fasting). You may have this done every 1-3 years. Bone density scan. This is done to screen for osteoporosis. You may have this done starting at age 64. Mammogram. This may be done every 1-2 years. Talk to your health care provider about how often you should have regular mammograms. Talk with your health care provider about your test results, treatment options, and if necessary, the need for more tests. Vaccines  Your health care provider may recommend certain vaccines, such as: Influenza vaccine. This is recommended every year. Tetanus, diphtheria, and acellular pertussis (Tdap, Td) vaccine. You may need a Td booster every 10 years. Zoster vaccine. You may need this after age 43. Pneumococcal 13-valent conjugate (PCV13) vaccine. One dose is recommended after age 20. Pneumococcal polysaccharide  (  PPSV23) vaccine. One dose is recommended after age 13. Talk to your health care provider about which screenings and vaccines you need and how often you need them. This information is not intended to replace advice given to you by your health care provider. Make sure you discuss any questions you have with your health care provider. Document Released: 06/10/2015 Document Revised: 02/01/2016 Document Reviewed: 03/15/2015 Elsevier Interactive Patient Education  2017 [place] Prevention in the Home Falls can cause injuries. They can happen to people of all ages. There are many things you can do to make your home safe and to help prevent falls. What can I do on the outside of my home? Regularly fix the edges of walkways and driveways and fix any cracks. Remove anything that might make you trip as you walk through a door, such as a raised step or threshold. Trim any bushes or trees on the path to your home. Use bright outdoor lighting. Clear any walking paths of anything that might make someone trip, such as rocks or tools. Regularly check to see if handrails are loose or broken. Make sure that both sides of any steps have handrails. Any raised decks and porches should have guardrails on the edges. Have any leaves, snow, or ice cleared regularly. Use sand or salt on walking paths during winter. Clean up any spills in your garage right away. This includes oil or grease spills. What can I do in the bathroom? Use night lights. Install grab bars by the toilet and in the tub and shower. Do not use towel bars as grab bars. Use non-skid mats or decals in the tub or shower. If you need to sit down in the shower, use a plastic, non-slip stool. Keep the floor dry. Clean up any water that spills on the floor as soon as it happens. Remove soap buildup in the tub or shower regularly. Attach bath mats securely with double-sided non-slip rug tape. Do not have throw rugs and other things on the  floor that can make you trip. What can I do in the bedroom? Use night lights. Make sure that you have a light by your bed that is easy to reach. Do not use any sheets or blankets that are too big for your bed. They should not hang down onto the floor. Have a firm chair that has side arms. You can use this for support while you get dressed. Do not have throw rugs and other things on the floor that can make you trip. What can I do in the kitchen? Clean up any spills right away. Avoid walking on wet floors. Keep items that you use a lot in easy-to-reach places. If you need to reach something above you, use a strong step stool that has a grab bar. Keep electrical cords out of the way. Do not use floor polish or wax that makes floors slippery. If you must use wax, use non-skid floor wax. Do not have throw rugs and other things on the floor that can make you trip. What can I do with my stairs? Do not leave any items on the stairs. Make sure that there are handrails on both sides of the stairs and use them. Fix handrails that are broken or loose. Make sure that handrails are as long as the stairways. Check any carpeting to make sure that it is firmly attached to the stairs. Fix any carpet that is loose or worn. Avoid having throw rugs at the top or bottom  of the stairs. If you do have throw rugs, attach them to the floor with carpet tape. Make sure that you have a light switch at the top of the stairs and the bottom of the stairs. If you do not have them, ask someone to add them for you. What else can I do to help prevent falls? Wear shoes that: Do not have high heels. Have rubber bottoms. Are comfortable and fit you well. Are closed at the toe. Do not wear sandals. If you use a stepladder: Make sure that it is fully opened. Do not climb a closed stepladder. Make sure that both sides of the stepladder are locked into place. Ask someone to hold it for you, if possible. Clearly mark and make  sure that you can see: Any grab bars or handrails. First and last steps. Where the edge of each step is. Use tools that help you move around (mobility aids) if they are needed. These include: Canes. Walkers. Scooters. Crutches. Turn on the lights when you go into a dark area. Replace any light bulbs as soon as they burn out. Set up your furniture so you have a clear path. Avoid moving your furniture around. If any of your floors are uneven, fix them. If there are any pets around you, be aware of where they are. Review your medicines with your doctor. Some medicines can make you feel dizzy. This can increase your chance of falling. Ask your doctor what other things that you can do to help prevent falls. This information is not intended to replace advice given to you by your health care provider. Make sure you discuss any questions you have with your health care provider. Document Released: 03/10/2009 Document Revised: 10/20/2015 Document Reviewed: 06/18/2014 Elsevier Interactive Patient Education  2017 [employer].

## 2021-08-01 ENCOUNTER — Other Ambulatory Visit: Payer: Self-pay | Admitting: Internal Medicine

## 2021-08-02 ENCOUNTER — Other Ambulatory Visit: Payer: Self-pay

## 2021-08-02 MED ORDER — ENTRESTO 97-103 MG PO TABS: 1.0000 | ORAL_TABLET | Freq: Two times a day (BID) | ORAL | 3 refills | Status: DC

## 2021-08-05 ENCOUNTER — Other Ambulatory Visit: Payer: Self-pay | Admitting: Internal Medicine

## 2021-08-05 DIAGNOSIS — E118 Type 2 diabetes mellitus with unspecified complications: Secondary | ICD-10-CM

## 2021-08-17 ENCOUNTER — Other Ambulatory Visit: Payer: Self-pay | Admitting: Internal Medicine

## 2021-08-17 DIAGNOSIS — M069 Rheumatoid arthritis, unspecified: Secondary | ICD-10-CM

## 2021-08-18 DIAGNOSIS — H524 Presbyopia: Secondary | ICD-10-CM | POA: Diagnosis not present

## 2021-08-18 DIAGNOSIS — H2513 Age-related nuclear cataract, bilateral: Secondary | ICD-10-CM | POA: Diagnosis not present

## 2021-08-30 NOTE — Progress Notes (Signed)
?Charlann Boxer D.O. ?[facility] ?[place] ?Phone: [phone] ?Subjective:   ?I, Jacqualin Combes, am serving as a scribe for Dr. Hulan Saas. ? ?This visit occurred during the SARS-CoV-2 public health emergency.  Safety protocols were in place, including screening questions prior to the visit, additional usage of staff PPE, and extensive cleaning of exam room while observing appropriate contact time as indicated for disinfecting solutions.  ? ? ?I'm seeing this patient by the request  of:  [name], MD ? ?CC: bilateral hip pain  ? ?[id number]  ?Emily Wheeler is a 66 y.o. female coming in with complaint of B hip pain. Last seen in Dec 2022 for B knee pain. GT injections March 2022. Nerve root injection February 10th, 2023. Has had relief from epidural and would like another.  ? ?Patient states that she has pain at night when lying on her sides. Does feel like GT injections are helpful.  ? ?Neurology told patient that shaking in arms and hands could be coming from neck.Would like to discuss this further.  ? ? ? ?  ? ?Past Medical History:  ?Diagnosis Date  ? Anemia   ? mild  ? Anxiety   ? Arthritis   ? Asthma   ? Cardiomyopathy ([place])   ? CHF (congestive heart failure) ([place])   ? Diabetes mellitus without complication ([place])   ? Frequency of urination   ? at night  ? GERD (gastroesophageal reflux disease)   ? potassium worsens reflux  ? Hemorrhoids, external   ? Hyperlipidemia   ? Hypertension   ? Neuromuscular disorder ([place])   ? neuropathy  ? Numbness and tingling of both legs   ? Rheumatoid arthritis ([place])   ? Vertigo   ? hx of  ? ?Past Surgical History:  ?Procedure Laterality Date  ? ABDOMINAL HYSTERECTOMY    ? CARDIAC CATHETERIZATION N/A 10/28/2014  ? Procedure: Left Heart Cath and Coronary Angiography;  Surgeon: Jettie Booze, MD;  Location: [facility];  Service: Cardiovascular;  Laterality: N/A;  ? CARPAL TUNNEL RELEASE Left   ? COLONOSCOPY    ? pt  states 11 yr ago in [place] had a colon with 2 polyps- one cecal polyp per pt. one ? location  ? COLONOSCOPY W/ POLYPECTOMY    ? CYST REMOVAL NECK    ? HEMORROIDECTOMY    ? MAXIMUM ACCESS (MAS)POSTERIOR LUMBAR INTERBODY FUSION (PLIF) 3 LEVEL N/A 10/08/2013  ? Procedure: FOR MAXIMUM ACCESS (MAS) POSTERIOR LUMBAR INTERBODY FUSION (PLIF) 3 LEVEL;  Surgeon: Erline Levine, MD;  Location: [facility];  Service: Neurosurgery;  Laterality: N/A;  L3-4 L4-5 L5-S1 maximum access posterior lumbar interbody fusion with decompression  ? TUBAL LIGATION    ? ?Social History  ? ?Socioeconomic History  ? Marital status: Single  ?  Spouse name: Not on file  ? Number of children: Not on file  ? Years of education: Not on file  ? Highest education level: Not on file  ?Occupational History  ? Not on file  ?Tobacco Use  ? Smoking status: Former  ?  Passive exposure: Current  ? Smokeless tobacco: Never  ?Vaping Use  ? Vaping Use: Never used  ?Substance and Sexual Activity  ? Alcohol use: Not Currently  ? Drug use: No  ?  Types: Marijuana  ? Sexual activity: Not Currently  ?Other Topics Concern  ? Not on file  ?Social History Narrative  ? Not on file  ? ?Social  Determinants of Health  ? ?Financial Resource Strain: Low Risk   ? Difficulty of Paying Living Expenses: Not hard at all  ?Food Insecurity: No Food Insecurity  ? Worried About Charity fundraiser in the Last Year: Never true  ? Ran Out of Food in the Last Year: Never true  ?Transportation Needs: No Transportation Needs  ? Lack of Transportation (Medical): No  ? Lack of Transportation (Non-Medical): No  ?Physical Activity: Inactive  ? Days of Exercise per Week: 0 days  ? Minutes of Exercise per Session: 0 min  ?Stress: No Stress Concern Present  ? Feeling of Stress : Not at all  ?Social Connections: Not on file  ? ?Allergies  ?Allergen Reactions  ? Lisinopril Cough  ? ?Family History  ?Problem Relation Age of Onset  ? Early death Father   ? Heart disease Father   ? Hypertension Sister   ?  Hypertension Brother   ? Diabetes Brother   ? Colon polyps Mother   ? Hashimoto's thyroiditis Sister   ? Colon cancer Maternal Grandmother   ?     in her 18s  ? Stomach cancer Maternal Aunt   ? Non-Hodgkin's lymphoma Sister   ? Alcohol abuse Neg Hx   ? COPD Neg Hx   ? Depression Neg Hx   ? Drug abuse Neg Hx   ? Hearing loss Neg Hx   ? Hyperlipidemia Neg Hx   ? Kidney disease Neg Hx   ? Stroke Neg Hx   ? Esophageal cancer Neg Hx   ? Rectal cancer Neg Hx   ? ? ?Current Outpatient Medications (Endocrine & Metabolic):  ?  dapagliflozin propanediol (FARXIGA) 10 MG TABS tablet, Take 1 tablet (10 mg total) by mouth daily before breakfast. ?  TRULICITY 7.51 WC/5.8NI SOPN, INJECT 1 SYRINGE SUBCUTANEOUSLY ONCE A WEEK ? ?Current Outpatient Medications (Cardiovascular):  ?  atorvastatin (LIPITOR) 40 MG tablet, Take 1 tablet by mouth once daily ?  carvedilol (COREG) 6.25 MG tablet, Take 1 tablet (6.25 mg total) by mouth 2 (two) times daily with a meal. ?  sacubitril-valsartan (ENTRESTO) 97-103 MG, Take 1 tablet by mouth 2 (two) times daily. ?  spironolactone (ALDACTONE) 25 MG tablet, Take 0.5 tablets (12.5 mg total) by mouth daily. ? ?Current Outpatient Medications (Respiratory):  ?  PROAIR RESPICLICK 778 (90 Base) MCG/ACT AEPB, INHALE 2 PUFFS BY MOUTH 4 TIMES DAILY AS NEEDED ? ?Current Outpatient Medications (Analgesics):  ?  allopurinol (ZYLOPRIM) 300 MG tablet, Take 1 tablet by mouth once daily ?  aspirin EC 81 MG tablet, Take 1 tablet (81 mg total) by mouth daily. ?  leflunomide (ARAVA) 20 MG tablet, Take 1 tablet by mouth once daily ? ? ?Current Outpatient Medications (Other):  ?  Accu-Chek FastClix Lancets MISC, USE 1  TO CHECK GLUCOSE THREE TIMES DAILY ?  Biotin 10 MG TABS, Take by mouth. ?  Blood Glucose Monitoring Suppl (ACCU-CHEK GUIDE) w/Device KIT, 1 Act by Does not apply route 3 (three) times daily. ?  Calcium Carb-Cholecalciferol (CALCIUM 600 + D PO), Take 1 tablet by mouth daily. ?  Cinnamon 500 MG TABS, Take by  mouth. ?  citalopram (CELEXA) 20 MG tablet, Take 1 tablet by mouth once daily ?  DEXILANT 60 MG capsule, Take 1 capsule (60 mg total) by mouth daily. ?  gabapentin (NEURONTIN) 600 MG tablet, TAKE 1 TABLET BY MOUTH THREE TIMES DAILY ?  glucose blood (ACCU-CHEK GUIDE) test strip, 1 each by Other route 3 (three) times  daily. ?  Lancets Misc. (ACCU-CHEK FASTCLIX LANCET) KIT, Inject 1 Act into the skin 3 (three) times daily. ?  Omega-3 Fatty Acids (FISH OIL) 1200 MG CAPS, Take 1,200 mg by mouth daily.  ? ? ?Reviewed prior external information including notes and imaging from  ?primary care provider ?As well as notes that were available from care everywhere and other healthcare systems. ? ?Past medical history, social, surgical and family history all reviewed in electronic medical record.  No pertanent information unless stated regarding to the chief complaint.  ? ?Review of Systems: ? No headache, visual changes, nausea, vomiting, diarrhea, constipation, dizziness, abdominal pain, skin rash, fevers, chills, night sweats, weight loss, swollen lymph nodes, body aches, joint swelling, chest pain, shortness of breath, mood changes. POSITIVE muscle aches ? ?Objective  ?Blood pressure 112/76, pulse 72, height 5' 4"  (1.626 m), last menstrual period 05/29/1991, SpO2 98 %. ?  ?General: No apparent distress alert and oriented x3 mood and affect normal, dressed appropriately.  ?HEENT: Pupils equal, extraocular movements intact  ?Respiratory: Patient's speak in full sentences and does not appear short of breath  ?Cardiovascular: No lower extremity edema, non tender, no erythema  ?Patient still has a significant antalgic gait, does have tenderness to palpation over the greater trochanteric area bilaterally.  Tenderness in the paraspinal musculature as well. ? ?Neck exam does have some loss of lordosis. ?Patient does have some limited sidebending bilaterally.  5 out of 5 strength of the upper extremities. ? ? ?Procedure: Real-time  Ultrasound Guided Injection of right greater trochanteric bursitis secondary to patient's body habitus ?Device: GE Logiq Q7 ?Ultrasound guided injection is preferred based studies that show increased durat

## 2021-08-31 ENCOUNTER — Ambulatory Visit (INDEPENDENT_AMBULATORY_CARE_PROVIDER_SITE_OTHER): Payer: Medicare Other

## 2021-08-31 ENCOUNTER — Ambulatory Visit: Payer: Self-pay

## 2021-08-31 ENCOUNTER — Ambulatory Visit (INDEPENDENT_AMBULATORY_CARE_PROVIDER_SITE_OTHER): Payer: Medicare Other | Admitting: Family Medicine

## 2021-08-31 VITALS — BP 112/76 | HR 72 | Ht 64.0 in

## 2021-08-31 DIAGNOSIS — M7061 Trochanteric bursitis, right hip: Secondary | ICD-10-CM

## 2021-08-31 DIAGNOSIS — M7062 Trochanteric bursitis, left hip: Secondary | ICD-10-CM | POA: Diagnosis not present

## 2021-08-31 DIAGNOSIS — M542 Cervicalgia: Secondary | ICD-10-CM

## 2021-08-31 DIAGNOSIS — M25552 Pain in left hip: Secondary | ICD-10-CM

## 2021-08-31 DIAGNOSIS — M5416 Radiculopathy, lumbar region: Secondary | ICD-10-CM | POA: Diagnosis not present

## 2021-08-31 DIAGNOSIS — M25551 Pain in right hip: Secondary | ICD-10-CM | POA: Diagnosis not present

## 2021-08-31 NOTE — Assessment & Plan Note (Signed)
Patient given injection and tolerated the procedure well, discussed icing regimen and home exercise, discussed with patient chronic problem with exacerbation that hopefully this will be beneficial.  Patient still is having some back pain as well but may try another injection.  Still wants to avoid any surgical intervention.  Follow-up with me again in 6 to 8 weeks ?

## 2021-08-31 NOTE — Patient Instructions (Signed)
Xray today ?Injected both hips ?[facility] for nerve root injection [phone] ?See me again in 2-3 months ?

## 2021-09-05 ENCOUNTER — Other Ambulatory Visit: Payer: Self-pay | Admitting: Internal Medicine

## 2021-09-05 ENCOUNTER — Other Ambulatory Visit: Payer: Self-pay | Admitting: Physician Assistant

## 2021-09-05 DIAGNOSIS — E118 Type 2 diabetes mellitus with unspecified complications: Secondary | ICD-10-CM

## 2021-09-07 ENCOUNTER — Other Ambulatory Visit: Payer: Self-pay | Admitting: Internal Medicine

## 2021-09-07 ENCOUNTER — Ambulatory Visit (INDEPENDENT_AMBULATORY_CARE_PROVIDER_SITE_OTHER): Payer: Medicare Other

## 2021-09-07 DIAGNOSIS — I10 Essential (primary) hypertension: Secondary | ICD-10-CM

## 2021-09-07 DIAGNOSIS — K219 Gastro-esophageal reflux disease without esophagitis: Secondary | ICD-10-CM

## 2021-09-07 DIAGNOSIS — N1831 Chronic kidney disease, stage 3a: Secondary | ICD-10-CM

## 2021-09-07 DIAGNOSIS — E118 Type 2 diabetes mellitus with unspecified complications: Secondary | ICD-10-CM

## 2021-09-07 DIAGNOSIS — J45990 Exercise induced bronchospasm: Secondary | ICD-10-CM

## 2021-09-07 MED ORDER — DAPAGLIFLOZIN PROPANEDIOL 10 MG PO TABS: 10.0000 mg | ORAL_TABLET | Freq: Every day | ORAL | 1 refills | Status: DC

## 2021-09-07 MED ORDER — PROAIR RESPICLICK 108 (90 BASE) MCG/ACT IN AEPB: INHALATION_SPRAY | RESPIRATORY_TRACT | 3 refills | Status: DC

## 2021-09-07 NOTE — Patient Instructions (Signed)
Visit Information ? ?Following are the goals we discussed today:  ? ?Manage My Medicine  ? ?Timeframe:  Long-Range Goal ?Priority:  Medium ?Start Date:           10/03/20                  ?Expected End Date:  09/08/2022               ? ?Follow Up Date 02/2022 ?  ?- call for medicine refill 2 or 3 days before it runs out ?- call if I am sick and can't take my medicine ?- keep a list of all the medicines I take; vitamins and herbals too ?- use a pillbox to sort medicine  ?  ?Why is this important?   ?These steps will help you keep on track with your medicines. ? ?Plan: Telephone follow up appointment with care management team member scheduled for:  6 months ?The patient has been provided with contact information for the care management team and has been advised to call with any health related questions or concerns.  ? ?Ellin Saba, PharmD ?Clinical Pharmacist, [facility]  ? ?Please call the care guide team at [phone] if you need to cancel or reschedule your appointment.  ? ?Patient verbalizes understanding of instructions and care plan provided today and agrees to view in MyChart. Active MyChart status confirmed with patient.   ? ?

## 2021-09-07 NOTE — Progress Notes (Signed)
? ?Chronic Care Management ?Pharmacy Note ? ?09/07/2021 ?Name:  LATYSHA THACKSTON MRN:  [id number] DOB:  16-Feb-1956 ? ?Summary: ?-Patient reports compliance to current medications, denies issues  ?-A1c has improved and is at goal since starting trulicity, reports BG typically averaging 115-120 in AM when she checks  ?-Monitoring BP at home on occasion  - typically averaging 119-120's/70's  ?-LDL increased with last check - patient denies any issues with statin adherence - notes diet may have been higher in fried foods / red meats recently  ? ?Recommendations/Changes made from today's visit: ?-Recommending no changes to medications at this time, patient to continue to monitor BG and BP at home, to reach out should either become uncontrolled  ?-Patient to have lipid panel rechecked with next PCP visit, should LDL remain elevated from goal, consider increase in atorvastatin to 38m daily  ? ?Subjective: ?Tameyah I TSmyreis an 66y.o. year old female who is a primary patient of JJanith Lima MD.  The CCM team was consulted for assistance with disease management and care coordination needs.   ? ?Engaged with patient by telephone for follow up visit in response to provider referral for pharmacy case management and/or care coordination services.  ? ?Consent to Services:  ?The patient was given information about Chronic Care Management services, agreed to services, and gave verbal consent prior to initiation of services.  Please see initial visit note for detailed documentation.  ? ?Patient Care Team: ?JJanith Lima MD as PCP - General (Internal Medicine) ?NDorothy Spark MD as PCP - Cardiology (Cardiology) ?Opthamology, [place] as CSet designer(Ophthalmology) ?[facility](Ph[facility]ist) ? ?Recent office visits: ?05/17/2021 - Dr. JRonnald Ramp- no changes to medications - f/u in 6 months  ? ?Recent consult visits: ?08/31/2021 - Dr. STamala Julian- Sports Medicine - steroid injection in both hips - f/u in 2-3  months  ?07/14/2021 - Dr. PJohney Frame- Cardiology - no changes to medications - f/u in 6 months  ?05/15/2021 - Dr. STamala Julian- Sports Medicine - durolane injection given - f/u in 2 months  ?05/01/2021 - Dr. YKrista Blue- Neurology - tremor - propranolol 158mBID rx'd  ? ?Hospital visits: ?None in previous 6 months ? ?Objective: ? ?Lab Results  ?Component Value Date  ? CREATININE 1.23 (H) 05/17/2021  ? BUN 19 05/17/2021  ? GFR 45.57 (L) 05/17/2021  ? GFRNONAA 44 (L) 06/21/2020  ? GFRAA 51 (L) 06/21/2020  ? NA 142 05/17/2021  ? K 4.2 05/17/2021  ? CALCIUM 9.4 05/17/2021  ? CO2 28 05/17/2021  ? GLUCOSE 169 (H) 05/17/2021  ? ? ?Lab Results  ?Component Value Date/Time  ? HGBA1C 6.3 05/17/2021 11:03 AM  ? HGBA1C 7.0 (H) 02/14/2021 08:56 AM  ? GFR 45.57 (L) 05/17/2021 11:03 AM  ? GFR 43.50 (L) 03/02/2021 02:23 PM  ? MICROALBUR 1.5 08/24/2020 10:32 AM  ? MICROALBUR 1.6 11/09/2019 11:56 AM  ?  ?Last diabetic Eye exam:  ?Lab Results  ?Component Value Date/Time  ? HMDIABEYEEXA No Retinopathy 06/30/2019 12:00 AM  ?  ?Last diabetic Foot exam:  ?Lab Results  ?Component Value Date/Time  ? HMDIABFOOTEX done 11/26/2017 12:00 AM  ?  ? ?Lab Results  ?Component Value Date  ? CHOL 156 02/14/2021  ? HDL 48.90 02/14/2021  ? LDBlair7 02/14/2021  ? LDLDIRECT 162.5 01/30/2013  ? TRIG 101.0 02/14/2021  ? CHOLHDL 3 02/14/2021  ? ? ? ?  Latest Ref Rng & Units 03/02/2021  ?  2:23 PM 08/09/2020  ?  8:06 AM 11/09/2019  ? 11:56 AM  ?Hepatic Function  ?Total Protein 6.0 - 8.3 g/dL 6.6   6.3    ? 7.0   6.7    ?Albumin 3.5 - 5.2 g/dL 4.1   4.1   4.3    ?AST 0 - 37 U/L _0 ?ALT 0 - 35 U/L _1 ?Alk Phosphatase 39 - 117 U/L 66   69   68    ?Total Bilirubin 0.2 - 1.2 mg/dL 0.5   0.5   0.4    ?Bilirubin, Direct 0.0 - 0.3 mg/dL   0.0    ? ? ?Lab Results  ?Component Value Date/Time  ? TSH 2.980 05/01/2021 08:53 AM  ? TSH 3.17 08/09/2020 08:06 AM  ? ? ? ?  Latest Ref Rng & Units 05/17/2021  ? 11:03 AM 03/02/2021  ?  2:23 PM 02/14/2021  ?  8:56 AM  ?CBC   ?WBC 4.0 - 10.5 K/uL 4.2   3.8   3.3    ?Hemoglobin 12.0 - 15.0 g/dL 10.6   10.9   11.0    ?Hematocrit 36.0 - 46.0 % 33.1   33.9   33.9    ?Platelets 150.0 - 400.0 K/uL 244.0   239.0   223.0    ? ? ?Lab Results  ?Component Value Date/Time  ? VD25OH 50.07 08/09/2020 08:06 AM  ? ? ?Clinical ASCVD: Yes  (PAD) ?The 10-year ASCVD risk score (Arnett DK, et al., 2019) is: 14.2% ?  Values used to calculate the score: ?    Age: 66 years ?    Sex: Female ?    Is Non-Hispanic African American: Yes ?    Diabetic: Yes ?    Tobacco smoker: No ?    Systolic Blood Pressure: 694 mmHg ?    Is BP treated: Yes ?    HDL Cholesterol: 48.9 mg/dL ?    Total Cholesterol: 156 mg/dL   ? ? ?  07/27/2021  ?  9:45 AM 07/19/2020  ?  3:51 PM 04/18/2020  ?  3:00 PM  ?Depression screen PHQ 2/9  ?Decreased Interest 0 0 0  ?Down, Depressed, Hopeless 0 0 0  ?PHQ - 2 Score 0 0 0  ?  ? ? ?Social History  ? ?Tobacco Use  ?Smoking Status Former  ? Passive exposure: Current  ?Smokeless Tobacco Never  ? ?BP Readings from Last 3 Encounters:  ?08/31/21 112/76  ?07/14/21 134/80  ?07/07/21 (!) 166/79  ? ?Pulse Readings from Last 3 Encounters:  ?08/31/21 72  ?07/14/21 95  ?07/07/21 84  ? ?Wt Readings from Last 3 Encounters:  ?07/27/21 223 lb (101.2 kg)  ?07/14/21 221 lb 12.8 oz (100.6 kg)  ?05/17/21 225 lb (102.1 kg)  ? ?BMI Readings from Last 3 Encounters:  ?08/31/21 38.28 kg/m?  ?07/27/21 38.28 kg/m?  ?07/14/21 38.07 kg/m?  ? ? ?Assessment/Interventions: Review of patient past medical history, allergies, medications, health status, including review of consultants reports, laboratory and other test data, was performed as part of comprehensive evaluation and provision of chronic care management services.  ? ?SDOH:  (Social Determinants of Health) assessments and interventions performed: Yes ? ?SDOH Screenings  ? ?Alcohol Screen: Not on file  ?Depression (PHQ2-9): Low Risk   ? PHQ-2 Score: 0  ?Financial Resource Strain: Low Risk   ? Difficulty of Paying Living  Expenses: Not hard at all  ?Food Insecurity: No Food Insecurity  ?  Worried About Charity fundraiser in the Last Year: Never true  ? Ran Out of Food in the Last Year: Never true  ?Housing: Not on file  ?Physical Activity: Inactive  ? Days of Exercise per Week: 0 days  ? Minutes of Exercise per Session: 0 min  ?Social Connections: Not on file  ?Stress: No Stress Concern Present  ? Feeling of Stress : Not at all  ?Tobacco Use: Medium Risk  ? Smoking Tobacco Use: Former  ? Smokeless Tobacco Use: Never  ? Passive Exposure: Current  ?Transportation Needs: No Transportation Needs  ? Lack of Transportation (Medical): No  ? Lack of Transportation (Non-Medical): No  ? ? ?CCM Care Plan ? ?Allergies  ?Allergen Reactions  ? Lisinopril Cough  ? ? ?Medications Reviewed Today   ? ? Reviewed by Tomasa Blase, [facility] (Pharmacist) on 09/07/21 at [phone]  Med List Status: <None>  ? ?Medication Order Taking? Sig Documenting Provider Last Dose Status Informant  ?Accu-Chek FastClix Lancets MISC [id number] Yes USE 1  TO CHECK GLUCOSE THREE TIMES DAILY [name], MD Taking Active   ?allopurinol (ZYLOPRIM) 300 MG tablet [id number] Yes Take 1 tablet by mouth once daily Lyndal Pulley, DO Taking Active   ?aspirin EC 81 MG tablet [id number] Yes Take 1 tablet (81 mg total) by mouth daily. Dorothy Spark, MD Taking Active Self  ?atorvastatin (LIPITOR) 40 MG tablet [id number] Yes Take 1 tablet by mouth once daily [name], MD Taking Active   ?Biotin 10 MG TABS [id number] Yes Take by mouth. [provider] Taking Active   ?Blood Glucose Monitoring Suppl (ACCU-CHEK GUIDE) w/Device KIT [id number] Yes 1 Act by Does not apply route 3 (three) times daily. [name], MD Taking Active   ?Calcium Carb-Cholecalciferol (CALCIUM 600 + D PO) [id number] Yes Take 1 tablet by mouth daily. [provider] Taking Active Self  ?carvedilol (COREG) 6.25 MG tablet [id number] Yes Take 1 tablet (6.25 mg total) by mouth 2 (two) times daily  with a meal. [name], MD Taking Active   ?Cinnamon 500 MG TABS [id number] Yes Take by mouth. [provider] Taking Active   ?citalopram (CELEXA) 20 MG tablet [id number] Yes Take 1 tablet by mou

## 2021-09-18 LAB — HM DIABETES EYE EXAM

## 2021-09-19 ENCOUNTER — Other Ambulatory Visit: Payer: Self-pay | Admitting: Family Medicine

## 2021-09-24 DIAGNOSIS — I10 Essential (primary) hypertension: Secondary | ICD-10-CM

## 2021-09-24 DIAGNOSIS — E118 Type 2 diabetes mellitus with unspecified complications: Secondary | ICD-10-CM

## 2021-09-24 DIAGNOSIS — N1831 Chronic kidney disease, stage 3a: Secondary | ICD-10-CM

## 2021-10-03 ENCOUNTER — Inpatient Hospital Stay: Admission: RE | Admit: 2021-10-03 | Payer: Medicare Other | Source: Ambulatory Visit

## 2021-10-04 ENCOUNTER — Other Ambulatory Visit: Payer: Self-pay | Admitting: Internal Medicine

## 2021-10-04 DIAGNOSIS — E118 Type 2 diabetes mellitus with unspecified complications: Secondary | ICD-10-CM

## 2021-10-06 ENCOUNTER — Ambulatory Visit
Admission: RE | Admit: 2021-10-06 | Discharge: 2021-10-06 | Disposition: A | Discharge status: Home / Self Care | Payer: Medicare Other | Source: Ambulatory Visit | Attending: Family Medicine | Admitting: Family Medicine

## 2021-10-06 DIAGNOSIS — M5417 Radiculopathy, lumbosacral region: Secondary | ICD-10-CM | POA: Diagnosis not present

## 2021-10-06 DIAGNOSIS — M5416 Radiculopathy, lumbar region: Secondary | ICD-10-CM

## 2021-10-06 MED ORDER — METHYLPREDNISOLONE ACETATE 40 MG/ML INJ SUSP (RADIOLOG: 80.0000 mg | Freq: Once | INTRAMUSCULAR | Status: DC

## 2021-10-06 MED ORDER — IOPAMIDOL (ISOVUE-M 200) INJECTION 41%: 1.0000 mL | Freq: Once | INTRAMUSCULAR | Status: DC

## 2021-10-06 NOTE — Discharge Instructions (Signed)

## 2021-10-18 ENCOUNTER — Other Ambulatory Visit: Payer: Self-pay | Admitting: Internal Medicine

## 2021-10-18 DIAGNOSIS — M069 Rheumatoid arthritis, unspecified: Secondary | ICD-10-CM

## 2021-10-30 ENCOUNTER — Telehealth: Payer: Self-pay

## 2021-10-30 ENCOUNTER — Encounter: Payer: Self-pay | Admitting: Family Medicine

## 2021-10-30 NOTE — Telephone Encounter (Signed)
Yes that would be fine I will do it

## 2021-10-31 ENCOUNTER — Other Ambulatory Visit: Payer: Self-pay | Admitting: Internal Medicine

## 2021-10-31 DIAGNOSIS — E118 Type 2 diabetes mellitus with unspecified complications: Secondary | ICD-10-CM

## 2021-11-20 ENCOUNTER — Other Ambulatory Visit: Payer: Self-pay | Admitting: Internal Medicine

## 2021-11-20 DIAGNOSIS — E118 Type 2 diabetes mellitus with unspecified complications: Secondary | ICD-10-CM

## 2021-11-26 ENCOUNTER — Other Ambulatory Visit: Payer: Self-pay | Admitting: Internal Medicine

## 2021-11-27 ENCOUNTER — Other Ambulatory Visit: Payer: Self-pay | Admitting: Internal Medicine

## 2021-11-27 DIAGNOSIS — E118 Type 2 diabetes mellitus with unspecified complications: Secondary | ICD-10-CM

## 2021-12-03 ENCOUNTER — Other Ambulatory Visit: Payer: Self-pay | Admitting: Internal Medicine

## 2021-12-06 ENCOUNTER — Ambulatory Visit: Payer: Self-pay

## 2021-12-06 ENCOUNTER — Ambulatory Visit (INDEPENDENT_AMBULATORY_CARE_PROVIDER_SITE_OTHER): Payer: Medicare Other | Admitting: Family Medicine

## 2021-12-06 VITALS — BP 118/82 | HR 82 | Ht 64.0 in | Wt 229.0 lb

## 2021-12-06 DIAGNOSIS — M48062 Spinal stenosis, lumbar region with neurogenic claudication: Secondary | ICD-10-CM | POA: Diagnosis not present

## 2021-12-06 DIAGNOSIS — M5416 Radiculopathy, lumbar region: Secondary | ICD-10-CM | POA: Diagnosis not present

## 2021-12-06 DIAGNOSIS — G8929 Other chronic pain: Secondary | ICD-10-CM

## 2021-12-06 DIAGNOSIS — M2550 Pain in unspecified joint: Secondary | ICD-10-CM

## 2021-12-06 DIAGNOSIS — M170 Bilateral primary osteoarthritis of knee: Secondary | ICD-10-CM

## 2021-12-06 NOTE — Assessment & Plan Note (Signed)
Chronic problem with worsening symptoms.  Could be a candidate again for viscosupplementation.  We will try to buy patient time.  Secondary to patient's cardiomyopathy would be a high risk surgical patient and we will need to continue to monitor.  Patient is also having back pain and will consider the possibility of another epidural.  We will follow-up with me again in 6 to 8 months for likely viscosupplementation

## 2021-12-06 NOTE — Progress Notes (Signed)
[place] Phone: [phone] Subjective:   Emily Wheeler, am serving as a scribe for Dr. Hulan Saas.  I'm seeing this patient by the request  of:  [name], MD  CC: bilateral knee pain   GMW:[id number]  Emily Wheeler is a 66 y.o. female coming in with complaint of B knee pain. Also f/u for B hip pain, lumbar radiculopathy. Nerve root injection 10/06/2021. Patient states that her pain started 30 days ago. Patient does feel like injection help but is having radicular symptoms traveling down back of her leg.        Past Medical History:  Diagnosis Date   Anemia    mild   Anxiety    Arthritis    Asthma    Cardiomyopathy ([place])    CHF (congestive heart failure) (HCC)    Diabetes mellitus without complication (HCC)    Frequency of urination    at night   GERD (gastroesophageal reflux disease)    potassium worsens reflux   Hemorrhoids, external    Hyperlipidemia    Hypertension    Neuromuscular disorder (HCC)    neuropathy   Numbness and tingling of both legs    Rheumatoid arthritis ([place])    Vertigo    hx of   Past Surgical History:  Procedure Laterality Date   ABDOMINAL HYSTERECTOMY     CARDIAC CATHETERIZATION N/A 10/28/2014   Procedure: Left Heart Cath and Coronary Angiography;  Surgeon: Jettie Booze, MD;  Location: [facility];  Service: Cardiovascular;  Laterality: N/A;   CARPAL TUNNEL RELEASE Left    COLONOSCOPY     pt states 11 yr ago in [place] had a colon with 2 polyps- one cecal polyp per pt. one ? location   COLONOSCOPY W/ POLYPECTOMY     CYST REMOVAL NECK     HEMORROIDECTOMY     MAXIMUM ACCESS (MAS)POSTERIOR LUMBAR INTERBODY FUSION (PLIF) 3 LEVEL N/A 10/08/2013   Procedure: FOR MAXIMUM ACCESS (MAS) POSTERIOR LUMBAR INTERBODY FUSION (PLIF) 3 LEVEL;  Surgeon: Erline Levine, MD;  Location: [facility];  Service: Neurosurgery;  Laterality: N/A;  L3-4 L4-5 L5-S1 maximum  access posterior lumbar interbody fusion with decompression   TUBAL LIGATION     Social History   Socioeconomic History   Marital status: Single    Spouse name: Not on file   Number of children: Not on file   Years of education: Not on file   Highest education level: Not on file  Occupational History   Not on file  Tobacco Use   Smoking status: Former    Passive exposure: Current   Smokeless tobacco: Never  Vaping Use   Vaping Use: Never used  Substance and Sexual Activity   Alcohol use: Not Currently   Drug use: Wheeler    Types: Marijuana   Sexual activity: Not Currently  Other Topics Concern   Not on file  Social History Narrative   Not on file   Social Determinants of Health   Financial Resource Strain: Low Risk  (07/27/2021)   Overall Financial Resource Strain (CARDIA)    Difficulty of Paying Living Expenses: Not hard at all  Food Insecurity: Wheeler Food Insecurity (07/27/2021)   Hunger Vital Sign    Worried About Running Out of Food in the Last Year: Never true    Ran Out of Food in the Last Year: Never true  Transportation Needs: Wheeler Transportation Needs (07/27/2021)  PRAPARE - Hydrologist (Medical): Wheeler    Lack of Transportation (Non-Medical): Wheeler  Physical Activity: Inactive (07/27/2021)   Exercise Vital Sign    Days of Exercise per Week: 0 days    Minutes of Exercise per Session: 0 min  Stress: Wheeler Stress Concern Present (07/27/2021)   [place]    Feeling of Stress : Not at all  Social Connections: Moderately Integrated (07/19/2020)   Social Connection and Isolation Panel [NHANES]    Frequency of Communication with Friends and Family: More than three times a week    Frequency of Social Gatherings with Friends and Family: More than three times a week    Attends Religious Services: More than 4 times per year    Active Member of Clubs or Organizations: Wheeler    Attends Programme researcher, broadcasting/film/video: More than 4 times per year    Marital Status: Widowed   Allergies  Allergen Reactions   Lisinopril Cough   Family History  Problem Relation Age of Onset   Early death Father    Heart disease Father    Hypertension Sister    Hypertension Brother    Diabetes Brother    Colon polyps Mother    Hashimoto's thyroiditis Sister    Colon cancer Maternal Grandmother        in her 32s   Stomach cancer Maternal Aunt    Non-Hodgkin's lymphoma Sister    Alcohol abuse Neg Hx    COPD Neg Hx    Depression Neg Hx    Drug abuse Neg Hx    Hearing loss Neg Hx    Hyperlipidemia Neg Hx    Kidney disease Neg Hx    Stroke Neg Hx    Esophageal cancer Neg Hx    Rectal cancer Neg Hx     Current Outpatient Medications (Endocrine & Metabolic):    dapagliflozin propanediol (FARXIGA) 10 MG TABS tablet, Take 1 tablet (10 mg total) by mouth daily before breakfast.   TRULICITY 0.10 XN/2.3FT SOPN, INJECT 1 SYRINGE SUBCUTANEOUSLY ONCE A WEEK  Current Outpatient Medications (Cardiovascular):    atorvastatin (LIPITOR) 40 MG tablet, Take 1 tablet by mouth once daily   carvedilol (COREG) 6.25 MG tablet, Take 1 tablet (6.25 mg total) by mouth 2 (two) times daily with a meal.   sacubitril-valsartan (ENTRESTO) 97-103 MG, Take 1 tablet by mouth 2 (two) times daily.   spironolactone (ALDACTONE) 25 MG tablet, Take 1/2 (one-half) tablet by mouth once daily  Current Outpatient Medications (Respiratory):    Albuterol Sulfate (PROAIR RESPICLICK) 732 (90 Base) MCG/ACT AEPB, INHALE 2 PUFFS BY MOUTH 4 TIMES DAILY AS NEEDED  Current Outpatient Medications (Analgesics):    allopurinol (ZYLOPRIM) 300 MG tablet, Take 1 tablet by mouth once daily   aspirin EC 81 MG tablet, Take 1 tablet (81 mg total) by mouth daily.   leflunomide (ARAVA) 20 MG tablet, Take 1 tablet by mouth once daily   Current Outpatient Medications (Other):    Accu-Chek FastClix Lancets MISC, USE 1  TO CHECK GLUCOSE THREE TIMES  DAILY   Biotin 10 MG TABS, Take by mouth.   Blood Glucose Monitoring Suppl (ACCU-CHEK GUIDE) w/Device KIT, 1 Act by Does not apply route 3 (three) times daily.   Calcium Carb-Cholecalciferol (CALCIUM 600 + D PO), Take 1 tablet by mouth daily.   Cinnamon 500 MG TABS, Take by mouth.   citalopram (CELEXA) 20 MG tablet, Take 1 tablet  by mouth once daily   DEXILANT 60 MG capsule, Take 1 capsule (60 mg total) by mouth daily.   gabapentin (NEURONTIN) 600 MG tablet, TAKE 1 TABLET BY MOUTH THREE TIMES DAILY   glucose blood (ACCU-CHEK GUIDE) test strip, 1 each by Other route 3 (three) times daily.   Lancets Misc. (ACCU-CHEK FASTCLIX LANCET) KIT, Inject 1 Act into the skin 3 (three) times daily.   Omega-3 Fatty Acids (FISH OIL) 1200 MG CAPS, Take 1,200 mg by mouth daily.    Reviewed prior external information including notes and imaging from  primary care provider As well as notes that were available from care everywhere and other healthcare systems.  Past medical history, social, surgical and family history all reviewed in electronic medical record.  Wheeler pertanent information unless stated regarding to the chief complaint.   Review of Systems:  Wheeler headache, visual changes, nausea, vomiting, diarrhea, constipation, dizziness, abdominal pain, skin rash, fevers, chills, night sweats, weight loss, swollen lymph nodes, body aches, joint swelling, chest pain, shortness of breath, mood changes. POSITIVE muscle aches  Objective  Blood pressure 118/82, pulse 82, height _0  (1.626 m), weight 229 lb (103.9 kg), last menstrual period 05/29/1991, SpO2 96 %.   General: Wheeler apparent distress alert and oriented x3 mood and affect normal, dressed appropriately.  HEENT: Pupils equal, extraocular movements intact  Respiratory: Patient's speak in full sentences and does not appear short of breath  Cardiovascular: Wheeler lower extremity edema, non tender, Wheeler erythema  Knee exam shows arthritic changes noted bilaterally.   Instability noted with the knees bilaterally. Patient does have antalgic gait using the aid of a cane.  Patient's right knee does have significant effusion noted.  After informed written and verbal consent, patient was seated on exam table. Left knee was prepped with alcohol swab and utilizing anterolateral approach, patient's left knee space was injected with 4:1  marcaine 0.5%: Kenalog 18m/dL. Patient tolerated the procedure well without immediate complications.  Procedure: Real-time Ultrasound Guided Injection of right knee Device: GE Logiq Q7 Ultrasound guided injection is preferred based studies that show increased duration, increased effect, greater accuracy, decreased procedural pain, increased response rate, and decreased cost with ultrasound guided versus blind injection.  Verbal informed consent obtained.  Time-out conducted.  Noted Wheeler overlying erythema, induration, or other signs of local infection.  Skin prepped in a sterile fashion.  Local anesthesia: Topical Ethyl chloride.  With sterile technique and under real time ultrasound guidance: With a 22-gauge 2 inch needle patient was injected with 4 cc of 0.5% Marcaine and aspirated 35 cc of straw-colored fluid and then injected with 1 cc of Kenalog 40 mg/dL. This was from a superior lateral approach.  Completed without difficulty  Pain immediately improved suggesting accurate placement of the medication.  Advised to call if fevers/chills, erythema, induration, drainage, or persistent bleeding.  Impression: Technically successful ultrasound guided injection.    Impression and Recommendations:    The above documentation has been reviewed and is accurate and complete ZLyndal Pulley DO

## 2021-12-06 NOTE — Patient Instructions (Addendum)
Injected knees today  Imaging will call you Will get gel approved See me again 5-6 weeks

## 2021-12-06 NOTE — Assessment & Plan Note (Signed)
Worsening symptoms again at this time.  Discussed which activities to do and which ones to avoid.  I do believe the patient worsening symptoms again and did respond initially to the injection we should consider it again.  Depending on how patient responds could be a candidate for also radiofrequency ablation.  Follow-up 6 weeks after the injection

## 2021-12-07 ENCOUNTER — Other Ambulatory Visit: Payer: Self-pay

## 2021-12-07 LAB — SYNOVIAL FLUID ANALYSIS, COMPLETE
Basophils, %: 0 %
Eosinophils-Synovial: 0 % (ref 0–2)
Lymphocytes-Synovial Fld: 8 % (ref 0–74)
Monocyte/Macrophage: 16 % (ref 0–69)
Neutrophil, Synovial: 76 % — ABNORMAL HIGH (ref 0–24)
Synoviocytes, %: 0 % (ref 0–15)
WBC, Synovial: 297 cells/uL — ABNORMAL HIGH (ref <150)

## 2021-12-07 NOTE — Addendum Note (Signed)
Addended by: Ethlyn Daniels on: 12/07/2021 07:10 AM   Modules accepted: Orders

## 2021-12-19 ENCOUNTER — Other Ambulatory Visit: Payer: Self-pay | Admitting: Internal Medicine

## 2021-12-19 ENCOUNTER — Other Ambulatory Visit: Payer: Self-pay | Admitting: Family Medicine

## 2021-12-19 DIAGNOSIS — E118 Type 2 diabetes mellitus with unspecified complications: Secondary | ICD-10-CM

## 2021-12-19 DIAGNOSIS — I10 Essential (primary) hypertension: Secondary | ICD-10-CM

## 2021-12-20 ENCOUNTER — Other Ambulatory Visit: Payer: Self-pay | Admitting: Internal Medicine

## 2021-12-20 DIAGNOSIS — I10 Essential (primary) hypertension: Secondary | ICD-10-CM

## 2021-12-20 DIAGNOSIS — K219 Gastro-esophageal reflux disease without esophagitis: Secondary | ICD-10-CM

## 2021-12-28 ENCOUNTER — Other Ambulatory Visit: Payer: Self-pay | Admitting: Internal Medicine

## 2021-12-28 DIAGNOSIS — E118 Type 2 diabetes mellitus with unspecified complications: Secondary | ICD-10-CM

## 2022-01-05 ENCOUNTER — Ambulatory Visit
Admission: RE | Admit: 2022-01-05 | Discharge: 2022-01-05 | Disposition: A | Discharge status: Home / Self Care | Payer: Medicare Other | Source: Ambulatory Visit | Attending: Family Medicine | Admitting: Family Medicine

## 2022-01-05 DIAGNOSIS — M4727 Other spondylosis with radiculopathy, lumbosacral region: Secondary | ICD-10-CM | POA: Diagnosis not present

## 2022-01-05 DIAGNOSIS — M5416 Radiculopathy, lumbar region: Secondary | ICD-10-CM

## 2022-01-05 MED ORDER — IOPAMIDOL (ISOVUE-M 200) INJECTION 41%
1.0000 mL | Freq: Once | INTRAMUSCULAR | Status: AC
  Administered 2022-01-05: 1 mL via EPIDURAL

## 2022-01-05 MED ORDER — METHYLPREDNISOLONE ACETATE 40 MG/ML INJ SUSP (RADIOLOG
80.0000 mg | Freq: Once | INTRAMUSCULAR | Status: AC
  Administered 2022-01-05: 80 mg via EPIDURAL

## 2022-01-05 NOTE — Discharge Instructions (Signed)

## 2022-01-11 ENCOUNTER — Ambulatory Visit
Admission: RE | Admit: 2022-01-11 | Discharge: 2022-01-11 | Disposition: A | Discharge status: Home / Self Care | Payer: Medicare Other | Source: Ambulatory Visit | Attending: Internal Medicine | Admitting: Internal Medicine

## 2022-01-11 DIAGNOSIS — Z13820 Encounter for screening for osteoporosis: Secondary | ICD-10-CM

## 2022-01-11 DIAGNOSIS — Z780 Asymptomatic menopausal state: Secondary | ICD-10-CM | POA: Diagnosis not present

## 2022-01-11 LAB — HM DEXA SCAN: HM Dexa Scan: NORMAL

## 2022-01-15 ENCOUNTER — Other Ambulatory Visit: Payer: Self-pay | Admitting: Internal Medicine

## 2022-01-15 DIAGNOSIS — E118 Type 2 diabetes mellitus with unspecified complications: Secondary | ICD-10-CM

## 2022-02-06 ENCOUNTER — Encounter: Payer: Self-pay | Admitting: Internal Medicine

## 2022-02-06 ENCOUNTER — Other Ambulatory Visit: Payer: Self-pay

## 2022-02-06 ENCOUNTER — Ambulatory Visit (INDEPENDENT_AMBULATORY_CARE_PROVIDER_SITE_OTHER): Payer: Medicare Other | Admitting: Internal Medicine

## 2022-02-06 VITALS — BP 118/74 | HR 85 | Temp 97.9°F | Ht 64.0 in | Wt 229.0 lb

## 2022-02-06 DIAGNOSIS — D510 Vitamin B12 deficiency anemia due to intrinsic factor deficiency: Secondary | ICD-10-CM | POA: Diagnosis not present

## 2022-02-06 DIAGNOSIS — N1831 Chronic kidney disease, stage 3a: Secondary | ICD-10-CM

## 2022-02-06 DIAGNOSIS — E785 Hyperlipidemia, unspecified: Secondary | ICD-10-CM | POA: Diagnosis not present

## 2022-02-06 DIAGNOSIS — D61818 Other pancytopenia: Secondary | ICD-10-CM | POA: Diagnosis not present

## 2022-02-06 DIAGNOSIS — I10 Essential (primary) hypertension: Secondary | ICD-10-CM | POA: Diagnosis not present

## 2022-02-06 DIAGNOSIS — E118 Type 2 diabetes mellitus with unspecified complications: Secondary | ICD-10-CM | POA: Diagnosis not present

## 2022-02-06 DIAGNOSIS — Z0001 Encounter for general adult medical examination with abnormal findings: Secondary | ICD-10-CM

## 2022-02-06 DIAGNOSIS — Z23 Encounter for immunization: Secondary | ICD-10-CM

## 2022-02-06 LAB — URINALYSIS, ROUTINE W REFLEX MICROSCOPIC
Bilirubin Urine: NEGATIVE
Hgb urine dipstick: NEGATIVE
Ketones, ur: NEGATIVE
Nitrite: NEGATIVE
Specific Gravity, Urine: 1.02 (ref 1.000–1.030)
Total Protein, Urine: NEGATIVE
Urine Glucose: >=1000 — AB
Urobilinogen, UA: 0.2 (ref 0.0–1.0)
pH: 5.5 (ref 5.0–8.0)

## 2022-02-06 LAB — HEMOGLOBIN A1C: Hgb A1c MFr Bld: 6.6 % — ABNORMAL HIGH (ref 4.6–6.5)

## 2022-02-06 LAB — CBC WITH DIFFERENTIAL/PLATELET
Basophils Absolute: 0 10*3/uL (ref 0.0–0.1)
Basophils Relative: 0.7 % (ref 0.0–3.0)
Eosinophils Absolute: 0.1 10*3/uL (ref 0.0–0.7)
Eosinophils Relative: 3.4 % (ref 0.0–5.0)
HCT: 34.5 % — ABNORMAL LOW (ref 36.0–46.0)
Hemoglobin: 11.3 g/dL — ABNORMAL LOW (ref 12.0–15.0)
Lymphocytes Relative: 40.2 % (ref 12.0–46.0)
Lymphs Abs: 1.4 10*3/uL (ref 0.7–4.0)
MCHC: 32.7 g/dL (ref 30.0–36.0)
MCV: 100.1 fl — ABNORMAL HIGH (ref 78.0–100.0)
Monocytes Absolute: 0.4 10*3/uL (ref 0.1–1.0)
Monocytes Relative: 12.4 % — ABNORMAL HIGH (ref 3.0–12.0)
Neutro Abs: 1.5 10*3/uL (ref 1.4–7.7)
Neutrophils Relative %: 43.3 % (ref 43.0–77.0)
Platelets: 222 10*3/uL (ref 150.0–400.0)
RBC: 3.45 Mil/uL — ABNORMAL LOW (ref 3.87–5.11)
RDW: 15.4 % (ref 11.5–15.5)
WBC: 3.4 10*3/uL — ABNORMAL LOW (ref 4.0–10.5)

## 2022-02-06 LAB — BASIC METABOLIC PANEL
BUN: 18 mg/dL (ref 6–23)
CO2: 27 mEq/L (ref 19–32)
Calcium: 9.3 mg/dL (ref 8.4–10.5)
Chloride: 105 mEq/L (ref 96–112)
Creatinine, Ser: 1.32 mg/dL — ABNORMAL HIGH (ref 0.40–1.20)
GFR: 41.65 mL/min — ABNORMAL LOW (ref >60.00)
Glucose, Bld: 122 mg/dL — ABNORMAL HIGH (ref 70–99)
Potassium: 4.3 mEq/L (ref 3.5–5.1)
Sodium: 141 mEq/L (ref 135–145)

## 2022-02-06 LAB — HEPATIC FUNCTION PANEL
ALT: 14 U/L (ref 0–35)
AST: 15 U/L (ref 0–37)
Albumin: 3.9 g/dL (ref 3.5–5.2)
Alkaline Phosphatase: 79 U/L (ref 39–117)
Bilirubin, Direct: 0.1 mg/dL (ref 0.0–0.3)
Total Bilirubin: 0.4 mg/dL (ref 0.2–1.2)
Total Protein: 7 g/dL (ref 6.0–8.3)

## 2022-02-06 LAB — FOLATE: Folate: >23.9 ng/mL (ref >5.9)

## 2022-02-06 LAB — LIPID PANEL
Cholesterol: 140 mg/dL (ref 0–200)
HDL: 47.9 mg/dL (ref >39.00)
LDL Cholesterol: 68 mg/dL (ref 0–99)
NonHDL: 92.43
Total CHOL/HDL Ratio: 3
Triglycerides: 123 mg/dL (ref 0.0–149.0)
VLDL: 24.6 mg/dL (ref 0.0–40.0)

## 2022-02-06 LAB — MICROALBUMIN / CREATININE URINE RATIO
Creatinine,U: 105.5 mg/dL
Microalb Creat Ratio: 1.6 mg/g (ref 0.0–30.0)
Microalb, Ur: 1.7 mg/dL (ref 0.0–1.9)

## 2022-02-06 LAB — VITAMIN B12: Vitamin B-12: >1500 pg/mL — ABNORMAL HIGH (ref 211–911)

## 2022-02-06 LAB — CK: Total CK: 112 U/L (ref 7–177)

## 2022-02-06 MED ORDER — SHINGRIX 50 MCG/0.5ML IM SUSR: 0.5000 mL | Freq: Once | INTRAMUSCULAR | 1 refills | Status: AC

## 2022-02-06 MED ORDER — BOOSTRIX 5-2.5-18.5 LF-MCG/0.5 IM SUSP: 0.5000 mL | Freq: Once | INTRAMUSCULAR | 0 refills | Status: DC

## 2022-02-06 MED ORDER — SPIRONOLACTONE 25 MG PO TABS: ORAL_TABLET | ORAL | 1 refills | Status: DC

## 2022-02-06 MED ORDER — BOOSTRIX 5-2.5-18.5 LF-MCG/0.5 IM SUSP: 0.5000 mL | Freq: Once | INTRAMUSCULAR | 0 refills | Status: AC

## 2022-02-06 MED ORDER — SHINGRIX 50 MCG/0.5ML IM SUSR: 0.5000 mL | Freq: Once | INTRAMUSCULAR | 1 refills | Status: DC

## 2022-02-06 NOTE — Patient Instructions (Signed)

## 2022-02-06 NOTE — Progress Notes (Signed)
Subjective:  Patient ID: Emily Wheeler, female    DOB: 25-Sep-1955  Age: 66 y.o. MRN: [id number]  CC: Annual Exam, Hypertension, Hyperlipidemia, and Diabetes   HPI Emily Wheeler presents for a CPX and f/up -   She complains of unchanged dyspnea on exertion.  She denies chest pain, diaphoresis, palpitations, or edema.  Outpatient Medications Prior to Visit  Medication Sig Dispense Refill   Accu-Chek FastClix Lancets MISC USE 1  TO CHECK GLUCOSE THREE TIMES DAILY 306 each 0   Albuterol Sulfate (PROAIR RESPICLICK) 450 (90 Base) MCG/ACT AEPB INHALE 2 PUFFS BY MOUTH 4 TIMES DAILY AS NEEDED 1 each 3   allopurinol (ZYLOPRIM) 300 MG tablet Take 1 tablet by mouth once daily 90 tablet 0   aspirin EC 81 MG tablet Take 1 tablet (81 mg total) by mouth daily. 90 tablet 3   atorvastatin (LIPITOR) 40 MG tablet Take 1 tablet by mouth once daily 90 tablet 0   Biotin 10 MG TABS Take by mouth.     Blood Glucose Monitoring Suppl (ACCU-CHEK GUIDE) w/Device KIT 1 Act by Does not apply route 3 (three) times daily. 2 kit 2   Calcium Carb-Cholecalciferol (CALCIUM 600 + D PO) Take 1 tablet by mouth daily.     carvedilol (COREG) 6.25 MG tablet TAKE 1 TABLET BY MOUTH TWICE DAILY WITH A MEAL 180 tablet 0   Cinnamon 500 MG TABS Take by mouth.     citalopram (CELEXA) 20 MG tablet Take 1 tablet by mouth once daily 90 tablet 0   dapagliflozin propanediol (FARXIGA) 10 MG TABS tablet Take 1 tablet (10 mg total) by mouth daily before breakfast. 90 tablet 1   DEXILANT 60 MG capsule Take 1 capsule by mouth once daily 90 capsule 0   gabapentin (NEURONTIN) 600 MG tablet TAKE 1 TABLET BY MOUTH THREE TIMES DAILY 270 tablet 0   glucose blood (ACCU-CHEK GUIDE) test strip 1 each by Other route 3 (three) times daily. 300 each 1   Lancets Misc. (ACCU-CHEK FASTCLIX LANCET) KIT Inject 1 Act into the skin 3 (three) times daily. 306 kit 1   leflunomide (ARAVA) 20 MG tablet Take 1 tablet by mouth once daily 90 tablet 0   Omega-3  Fatty Acids (FISH OIL) 1200 MG CAPS Take 1,200 mg by mouth daily.      sacubitril-valsartan (ENTRESTO) 97-103 MG Take 1 tablet by mouth 2 (two) times daily. 388 tablet 3   TRULICITY 8.28 MK/3.4JZ SOPN INJECT 0.75 MG  SUBCUTANEOUSLY ONCE A WEEK 4 mL 0   spironolactone (ALDACTONE) 25 MG tablet Take 1/2 (one-half) tablet by mouth once daily 45 tablet 1   No facility-administered medications prior to visit.    ROS Review of Systems  Constitutional:  Negative for appetite change, diaphoresis, fatigue and unexpected weight change.  HENT: Negative.    Eyes: Negative.   Respiratory:  Positive for shortness of breath. Negative for cough, chest tightness and wheezing.   Cardiovascular:  Negative for chest pain, palpitations and leg swelling.  Gastrointestinal:  Negative for abdominal pain, constipation, diarrhea, nausea and vomiting.  Endocrine: Negative.   Genitourinary: Negative.  Negative for difficulty urinating and hematuria.  Musculoskeletal:  Positive for arthralgias. Negative for joint swelling and myalgias.  Skin: Negative.   Neurological:  Negative for dizziness, weakness and light-headedness.  Hematological:  Negative for adenopathy. Does not bruise/bleed easily.  Psychiatric/Behavioral: Negative.      Objective:  BP 118/74 (BP Location: Left Arm, Patient Position: Sitting, Cuff Size: Large)  Pulse 85   Temp 97.9 F (36.6 C) (Oral)   Ht 5' 4"  (1.626 m)   Wt 229 lb (103.9 kg)   LMP 05/29/1991   SpO2 93%   BMI 39.31 kg/m   BP Readings from Last 3 Encounters:  02/06/22 118/74  01/05/22 (!) 145/84  12/06/21 118/82    Wt Readings from Last 3 Encounters:  02/06/22 229 lb (103.9 kg)  12/06/21 229 lb (103.9 kg)  07/27/21 223 lb (101.2 kg)    Physical Exam Vitals reviewed.  HENT:     Nose: Nose normal.     Mouth/Throat:     Mouth: Mucous membranes are moist.  Eyes:     General: No scleral icterus.    Conjunctiva/sclera: Conjunctivae normal.  Cardiovascular:      Rate and Rhythm: Normal rate and regular rhythm.     Heart sounds: No murmur heard. Pulmonary:     Effort: Pulmonary effort is normal.     Breath sounds: No stridor. No wheezing, rhonchi or rales.  Abdominal:     General: Abdomen is flat.     Palpations: There is no mass.     Tenderness: There is no abdominal tenderness. There is no guarding.     Hernia: No hernia is present.  Musculoskeletal:        General: Normal range of motion.     Cervical back: Neck supple.  Lymphadenopathy:     Cervical: No cervical adenopathy.  Skin:    General: Skin is warm.  Neurological:     General: No focal deficit present.     Mental Status: She is alert.  Psychiatric:        Mood and Affect: Mood normal.        Behavior: Behavior normal.     Lab Results  Component Value Date   WBC 3.4 (L) 02/06/2022   HGB 11.3 (L) 02/06/2022   HCT 34.5 (L) 02/06/2022   PLT 222.0 02/06/2022   GLUCOSE 122 (H) 02/06/2022   CHOL 140 02/06/2022   TRIG 123.0 02/06/2022   HDL 47.90 02/06/2022   LDLDIRECT 162.5 01/30/2013   LDLCALC 68 02/06/2022   ALT 14 02/06/2022   AST 15 02/06/2022   NA 141 02/06/2022   K 4.3 02/06/2022   CL 105 02/06/2022   CREATININE 1.32 (H) 02/06/2022   BUN 18 02/06/2022   CO2 27 02/06/2022   TSH 2.980 05/01/2021   INR 0.9 08/19/2018   HGBA1C 6.6 (H) 02/06/2022   MICROALBUR 1.7 02/06/2022    DEXAScan  Result Date: 01/11/2022 EXAM: DUAL X-RAY ABSORPTIOMETRY (DXA) FOR BONE MINERAL DENSITY IMPRESSION: Referring Physician:  [name] Your patient completed a bone mineral density test using GE Lunar iDXA system (analysis version: 16). Technologist: EDH PATIENT: Name: Emily Wheeler, Emily Wheeler Patient ID: [id number] Birth Date: 28-May-1956 Height: 50.5 in. Sex: Female Measured: 01/11/2022 Weight: 226.8 lbs. Indications: Arava, Celexa, Dexilant, Estrogen Deficient, Gabapentin, Hysterectomy, Insulin for Diabetes, Postmenopausal, Rheumatoid Arthritis (714.0) Fractures: NONE Treatments: Calcium  (E943.0), Vitamin D (E933.5) ASSESSMENT: The BMD measured at Femur Neck Right is 0.933 g/cm2 with a T-score of -0.8. This patient is considered normal according to [facility]) criteria. The quality of the exam is good. The lumbar spine was excluded due to surgical hardware. Site Region Measured Date Measured Age YA BMD Significant CHANGE T-score DualFemur Neck Right 01/11/2020 67.8 -0.8 0.933 g/cm2 DualFemur Total Mean 01/11/2022 67.8 0.1 1.019 g/cm2 Right Forearm Radius 33% 01/11/2020 67.8 0.1 0.884 g/cm2 World Health Organization [facility]) criteria  for post-menopausal, Caucasian Women: Normal       T-score at or above -1 SD Osteopenia   T-score between -1 and -2.5 SD Osteoporosis T-score at or below -2.5 SD RECOMMENDATION: 1. All patients should optimize calcium and vitamin D intake. 2. Consider FDA-approved medical therapies in postmenopausal women and men aged 57 years and older, based on the following: a. A hip or vertebral (clinical or morphometric) fracture. b. T-score = -2.5 at the femoral neck or spine after appropriate evaluation to exclude secondary causes. c. Low bone mass (T-score between -1.0 and -2.5 at the femoral neck or spine) and a 10-year probability of a hip fracture = 3% or a 10-year probability of a major osteoporosis-related fracture = 20% based on the [place]-adapted WHO algorithm. d. Clinician judgment and/or patient preferences may indicate treatment for people with 10-year fracture probabilities above or below these levels. FOLLOW-UP: Patients with diagnosis of osteoporosis or at high risk for fracture should have regular bone mineral density tests.? Patients eligible for Medicare are allowed routine testing every 2 years.? The testing frequency can be increased to one year for patients who have rapidly progressing disease, are receiving or discontinuing medical therapy to restore bone mass, or have additional risk factors. I have reviewed this study and agree with the  findings. [facility] Electronically Signed   By: Franki Cabot M.D.   On: 01/11/2022 10:15    Assessment & Plan:   Emily Wheeler was seen today for annual exam, hypertension, hyperlipidemia and diabetes.  Diagnoses and all orders for this visit:  Flu vaccine need -     Flu Vaccine QUAD High Dose(Fluad)  Essential hypertension, benign- Her blood pressure is adequately well controlled. -     Basic metabolic panel; Future -     Hepatic function panel; Future -     Hepatic function panel -     Basic metabolic panel  Stage 3a chronic kidney disease ([place])- Her renal function is stable. 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 Doing well on the statin  -     Lipid panel; Future -     Hepatic function panel; Future -     CK; Future -     CK -     Hepatic function panel -     Lipid panel  Type II diabetes mellitus with manifestations ([place])- Her blood sugar is well controlled. -     Hemoglobin A1c; Future -     Urinalysis, Routine w reflex microscopic; Future -     Microalbumin / creatinine urine ratio; Future -     HM Diabetes Foot Exam -     Microalbumin / creatinine urine ratio -     Urinalysis, Routine w reflex microscopic -     Hemoglobin A1c  Need for prophylactic vaccination with  combined diphtheria-tetanus-pertussis (DTP) vaccine -     Tdap (BOOSTRIX) 5-2.5-18.5 LF-MCG/0.5 injection; Inject 0.5 mLs into the muscle once for 1 dose.  Need for  prophylactic vaccination and inoculation against varicella -     Zoster Vaccine Adjuvanted [facility]) injection; Inject 0.5 mLs into the muscle once for 1 dose.  Pancytopenia ([place]) -     Ambulatory referral to Hematology / Oncology  Encounter for general adult medical examination with abnormal findings- Exam completed, labs reviewed, vaccines reviewed and updated, cancer screenings are up-to-date, patient education was given.  Other orders -     Discontinue: Tdap ([place]) 5-2.5-18.5 LF-MCG/0.5 injection; Inject 0.5 mLs into the muscle once for 1 dose. -     Discontinue: Zoster Vaccine Adjuvanted [facility]) injection; Inject 0.5 mLs into the muscle once for 1 dose.   I am having Emily Wheeler maintain her Fish Oil, aspirin EC, Calcium Carb-Cholecalciferol (CALCIUM 600 + D PO), Accu-Chek FastClix Lancet, Cinnamon, Biotin, Accu-Chek FastClix Lancets, Accu-Chek Guide, Accu-Chek Guide, Entresto, dapagliflozin propanediol, ProAir RespiClick, leflunomide, atorvastatin, citalopram, allopurinol, carvedilol, gabapentin, Dexilant, and Trulicity.  Meds ordered this encounter  Medications   DISCONTD: Tdap (BOOSTRIX) 5-2.5-18.5 LF-MCG/0.5 injection    Sig: Inject 0.5 mLs into the muscle once for 1 dose.    Dispense:  0.5 mL    Refill:  0   DISCONTD: Zoster Vaccine Adjuvanted [facility]) injection    Sig: Inject 0.5 mLs into the muscle once for 1 dose.    Dispense:  0.5 mL    Refill:  1   Tdap (BOOSTRIX) 5-2.5-18.5 LF-MCG/0.5 injection    Sig: Inject 0.5 mLs into the muscle once for 1 dose.    Dispense:  0.5 mL    Refill:  0   Zoster Vaccine Adjuvanted [facility]) injection    Sig: Inject 0.5 mLs into the muscle once for 1 dose.    Dispense:  0.5 mL    Refill:  1     Follow-up: Return in about 6 months (around 08/07/2022).  Scarlette Calico, MD

## 2022-02-07 ENCOUNTER — Telehealth: Payer: Self-pay | Admitting: Hematology and Oncology

## 2022-02-07 ENCOUNTER — Encounter: Payer: Self-pay | Admitting: Internal Medicine

## 2022-02-07 NOTE — Telephone Encounter (Signed)
Scheduled appt per 9/13 referral. Pt is aware of appt date and time. Pt is aware to arrive 15 mins prior to appt time and to bring and updated insurance card. Pt is aware of appt location.   

## 2022-02-11 DIAGNOSIS — D61818 Other pancytopenia: Secondary | ICD-10-CM | POA: Insufficient documentation

## 2022-02-11 DIAGNOSIS — Z23 Encounter for immunization: Secondary | ICD-10-CM | POA: Insufficient documentation

## 2022-02-13 ENCOUNTER — Other Ambulatory Visit: Payer: Self-pay | Admitting: Internal Medicine

## 2022-02-13 DIAGNOSIS — E118 Type 2 diabetes mellitus with unspecified complications: Secondary | ICD-10-CM

## 2022-02-15 NOTE — Progress Notes (Signed)
[facility] [place] Phone: [phone] Subjective:   Emily Wheeler, am serving as a scribe for Dr. Hulan Saas.  I'm seeing this patient by the request  of:  [name], MD  CC: back and knee pain   [id number]  12/06/2021 Worsening symptoms again at this time.  Discussed which activities to do and which ones to avoid.  I do believe the patient worsening symptoms again and did respond initially to the injection we should consider it again.  Depending on how patient responds could be a candidate for also radiofrequency ablation.  Follow-up 6 weeks after the injection  Chronic problem with worsening symptoms.  Could be a candidate again for viscosupplementation.  We will try to buy patient time.  Secondary to patient's cardiomyopathy would be a high risk surgical patient and we will need to continue to monitor.  Patient is also having back pain and will consider the possibility of another epidural.  We will follow-up with me again in 6 to 8 months for likely viscosupplementation  Update 02/16/2022 Emily Wheeler is a 66 y.o. female coming in with complaint of lumbar spine and B knee pain. Nerve root injection on 01/05/2022. States that she felt a lot of pain during epidural injection. Patient states that 5 days ago her pain in back increased. Unable to walk without walker and had hard time sleeping. This lasted throughout the weekend but subsided by Tuesday. Does have some residual pain from the weekend in L calf.       Past Medical History:  Diagnosis Date   Anemia    mild   Anxiety    Arthritis    Asthma    Cardiomyopathy ([place])    CHF (congestive heart failure) (HCC)    Diabetes mellitus without complication (HCC)    Frequency of urination    at night   GERD (gastroesophageal reflux disease)    potassium worsens reflux   Hemorrhoids, external    Hyperlipidemia    Hypertension    Neuromuscular disorder  (HCC)    neuropathy   Numbness and tingling of both legs    Rheumatoid arthritis ([place])    Vertigo    hx of   Past Surgical History:  Procedure Laterality Date   ABDOMINAL HYSTERECTOMY     CARDIAC CATHETERIZATION N/A 10/28/2014   Procedure: Left Heart Cath and Coronary Angiography;  Surgeon: Jettie Booze, MD;  Location: [facility];  Service: Cardiovascular;  Laterality: N/A;   CARPAL TUNNEL RELEASE Left    COLONOSCOPY     pt states 11 yr ago in [place] had a colon with 2 polyps- one cecal polyp per pt. one ? location   COLONOSCOPY W/ POLYPECTOMY     CYST REMOVAL NECK     HEMORROIDECTOMY     MAXIMUM ACCESS (MAS)POSTERIOR LUMBAR INTERBODY FUSION (PLIF) 3 LEVEL N/A 10/08/2013   Procedure: FOR MAXIMUM ACCESS (MAS) POSTERIOR LUMBAR INTERBODY FUSION (PLIF) 3 LEVEL;  Surgeon: Erline Levine, MD;  Location: [facility];  Service: Neurosurgery;  Laterality: N/A;  L3-4 L4-5 L5-S1 maximum access posterior lumbar interbody fusion with decompression   TUBAL LIGATION     Social History   Socioeconomic History   Marital status: Single    Spouse name: Not on file   Number of children: Not on file   Years of education: Not on file   Highest education level: Not on file  Occupational History   Not  on file  Tobacco Use   Smoking status: Former    Passive exposure: Current   Smokeless tobacco: Never  Vaping Use   Vaping Use: Never used  Substance and Sexual Activity   Alcohol use: Not Currently   Drug use: Wheeler    Types: Marijuana   Sexual activity: Not Currently  Other Topics Concern   Not on file  Social History Narrative   Not on file   Social Determinants of Health   Financial Resource Strain: Low Risk  (07/27/2021)   Overall Financial Resource Strain (CARDIA)    Difficulty of Paying Living Expenses: Not hard at all  Food Insecurity: Wheeler Food Insecurity (07/27/2021)   Hunger Vital Sign    Worried About Running Out of Food in the Last Year: Never true    Ran Out of Food in the  Last Year: Never true  Transportation Needs: Wheeler Transportation Needs (07/27/2021)   PRAPARE - Hydrologist (Medical): Wheeler    Lack of Transportation (Non-Medical): Wheeler  Physical Activity: Inactive (07/27/2021)   Exercise Vital Sign    Days of Exercise per Week: 0 days    Minutes of Exercise per Session: 0 min  Stress: Wheeler Stress Concern Present (07/27/2021)   [place]    Feeling of Stress : Not at all  Social Connections: Moderately Integrated (07/19/2020)   Social Connection and Isolation Panel [NHANES]    Frequency of Communication with Friends and Family: More than three times a week    Frequency of Social Gatherings with Friends and Family: More than three times a week    Attends Religious Services: More than 4 times per year    Active Member of Clubs or Organizations: Wheeler    Attends Music therapist: More than 4 times per year    Marital Status: Widowed   Allergies  Allergen Reactions   Lisinopril Cough   Family History  Problem Relation Age of Onset   Early death Father    Heart disease Father    Hypertension Sister    Hypertension Brother    Diabetes Brother    Colon polyps Mother    Hashimoto's thyroiditis Sister    Colon cancer Maternal Grandmother        in her 45s   Stomach cancer Maternal Aunt    Non-Hodgkin's lymphoma Sister    Alcohol abuse Neg Hx    COPD Neg Hx    Depression Neg Hx    Drug abuse Neg Hx    Hearing loss Neg Hx    Hyperlipidemia Neg Hx    Kidney disease Neg Hx    Stroke Neg Hx    Esophageal cancer Neg Hx    Rectal cancer Neg Hx     Current Outpatient Medications (Endocrine & Metabolic):    dapagliflozin propanediol (FARXIGA) 10 MG TABS tablet, Take 1 tablet (10 mg total) by mouth daily before breakfast.   TRULICITY 9.97 FS/1.4EL SOPN, INJECT 0.75MG SUBCUTANEOUSLY ONCE A WEEK  Current Outpatient Medications (Cardiovascular):     atorvastatin (LIPITOR) 40 MG tablet, Take 1 tablet by mouth once daily   carvedilol (COREG) 6.25 MG tablet, TAKE 1 TABLET BY MOUTH TWICE DAILY WITH A MEAL   sacubitril-valsartan (ENTRESTO) 97-103 MG, Take 1 tablet by mouth 2 (two) times daily.   spironolactone (ALDACTONE) 25 MG tablet, Take 1/2 (one-half) tablet by mouth once daily  Current Outpatient Medications (Respiratory):    Albuterol Sulfate (PROAIR  RESPICLICK) 299 (90 Base) MCG/ACT AEPB, INHALE 2 PUFFS BY MOUTH 4 TIMES DAILY AS NEEDED  Current Outpatient Medications (Analgesics):    allopurinol (ZYLOPRIM) 300 MG tablet, Take 1 tablet by mouth once daily   aspirin EC 81 MG tablet, Take 1 tablet (81 mg total) by mouth daily.   leflunomide (ARAVA) 20 MG tablet, Take 1 tablet by mouth once daily   Current Outpatient Medications (Other):    Accu-Chek FastClix Lancets MISC, USE 1  TO CHECK GLUCOSE THREE TIMES DAILY   Biotin 10 MG TABS, Take by mouth.   Blood Glucose Monitoring Suppl (ACCU-CHEK GUIDE) w/Device KIT, 1 Act by Does not apply route 3 (three) times daily.   Calcium Carb-Cholecalciferol (CALCIUM 600 + D PO), Take 1 tablet by mouth daily.   Cinnamon 500 MG TABS, Take by mouth.   citalopram (CELEXA) 20 MG tablet, Take 1 tablet by mouth once daily   DEXILANT 60 MG capsule, Take 1 capsule by mouth once daily   gabapentin (NEURONTIN) 600 MG tablet, TAKE 1 TABLET BY MOUTH THREE TIMES DAILY   glucose blood (ACCU-CHEK GUIDE) test strip, 1 each by Other route 3 (three) times daily.   Lancets Misc. (ACCU-CHEK FASTCLIX LANCET) KIT, Inject 1 Act into the skin 3 (three) times daily.   Omega-3 Fatty Acids (FISH OIL) 1200 MG CAPS, Take 1,200 mg by mouth daily.    Reviewed prior external information including notes and imaging from  primary care provider As well as notes that were available from care everywhere and other healthcare systems.  Past medical history, social, surgical and family history all reviewed in electronic medical  record.  Wheeler pertanent information unless stated regarding to the chief complaint.   Review of Systems:  Wheeler headache, visual changes, nausea, vomiting, diarrhea, constipation, dizziness, abdominal pain, skin rash, fevers, chills, night sweats, weight loss, swollen lymph nodes, body aches, joint swelling, chest pain, shortness of breath, mood changes. POSITIVE muscle aches  Objective  Blood pressure 118/62, pulse (!) 58, height 5' 4" (1.626 m), weight 229 lb (103.9 kg), last menstrual period 05/29/1991, SpO2 97 %.   General: Wheeler apparent distress alert and oriented x3 mood and affect normal, dressed appropriately.  HEENT: Pupils equal, extraocular movements intact  Respiratory: Patient's speak in full sentences and does not appear short of breath  Cardiovascular: Wheeler lower extremity edema, non tender, Wheeler erythema  Severely antalgic gait noted.  Back exam does have loss of lordosis.  Patient does have a tightness with straight leg test.  Severe arthritic changes of the knees bilaterally.  Trace effusion noted right greater than left.  Some arthritic changes in multiple other joints.  After informed written and verbal consent, patient was seated on exam table. Right knee was prepped with alcohol swab and utilizing anterolateral approach, patient's right knee space was injected with 60 mg per 3 mL of Durolane  (sodium hyaluronate) in a prefilled syringe was injected easily into the knee through a 22-gauge needle..Patient tolerated the procedure well without immediate complications.  After informed written and verbal consent, patient was seated on exam table. Left knee was prepped with alcohol swab and utilizing anterolateral approach, patient's left knee space was injected with 60 mg per 3 mL of Durolane (sodium hyaluronate) in a prefilled syringe was injected easily into the knee through a 22-gauge needle..Patient tolerated the procedure well without immediate complications.    Impression and  Recommendations:    The above documentation has been reviewed and is accurate and complete Olevia Bowens  Tamala Julian, DO

## 2022-02-16 ENCOUNTER — Ambulatory Visit (INDEPENDENT_AMBULATORY_CARE_PROVIDER_SITE_OTHER): Payer: Medicare Other | Admitting: Family Medicine

## 2022-02-16 VITALS — BP 118/62 | HR 58 | Ht 64.0 in | Wt 229.0 lb

## 2022-02-16 DIAGNOSIS — G8929 Other chronic pain: Secondary | ICD-10-CM | POA: Diagnosis not present

## 2022-02-16 DIAGNOSIS — M170 Bilateral primary osteoarthritis of knee: Secondary | ICD-10-CM | POA: Diagnosis not present

## 2022-02-16 DIAGNOSIS — M25562 Pain in left knee: Secondary | ICD-10-CM | POA: Diagnosis not present

## 2022-02-16 DIAGNOSIS — M48062 Spinal stenosis, lumbar region with neurogenic claudication: Secondary | ICD-10-CM | POA: Diagnosis not present

## 2022-02-16 DIAGNOSIS — M25561 Pain in right knee: Secondary | ICD-10-CM | POA: Diagnosis not present

## 2022-02-16 MED ORDER — SODIUM HYALURONATE 60 MG/3ML IX PRSY
120.0000 mg | PREFILLED_SYRINGE | Freq: Once | INTRA_ARTICULAR | Status: AC
  Administered 2022-02-16: 120 mg via INTRA_ARTICULAR

## 2022-02-16 NOTE — Assessment & Plan Note (Signed)
Viscosupplementation given today, tolerated the procedure well.  Patient does have end-stage osteoarthritic changes but due to other comorbidities trying to avoid any type of surgical intervention.  Follow-up again in 6 weeks

## 2022-02-16 NOTE — Patient Instructions (Signed)
See me in 2-3 months

## 2022-02-16 NOTE — Assessment & Plan Note (Signed)
Severe spinal stenosis as well as with patient having rheumatoid arthritis.  Patient does have some instability noted but likely of the fusion the patient has had.  Still wants to avoid surgical intervention.  Had unfortunately significant 3 days of pain after the last epidural.  We will continue to monitor.  Discussed icing regimen and home exercises.  No significant change in medications but I do feel we should consider the possibility of transitioning patient's Celexa to Cymbalta.  Follow-up again in 6 to 8 weeks.

## 2022-02-21 ENCOUNTER — Other Ambulatory Visit: Payer: Self-pay | Admitting: Internal Medicine

## 2022-02-21 DIAGNOSIS — M069 Rheumatoid arthritis, unspecified: Secondary | ICD-10-CM

## 2022-02-25 NOTE — Progress Notes (Unsigned)
Office Visit    Patient Name: Emily Wheeler Date of Encounter: 02/26/2022  PCP:  [name], MD   [place]  Cardiologist:  Freada Bergeron, MD  Advanced Practice Provider:  No care team member to display Electrophysiologist:  None  HPI    Emily Wheeler is a 66 y.o. female with a past medical history significant for HFrEF with improved EF, mild to moderate coronary artery disease, diabetes mellitus type 2, RA, hypertension, and hyperlipidemia presents today for 67-monthfollow-up.  Patient has history of nonischemic cardiomyopathy that was diagnosed in 2016.  She initially presented with a complaint of dyspnea.  Echocardiogram 08/2014 showed reduced EF at 35 to 40%.  Underwent a nuclear stress test which was abnormal, leading to cardiac catheterization.  Cardiac catheterization June 2016 showed mild CAD thus a diagnosis of nonischemic cardiomyopathy was made.  TTE 03/2016 showed reduced LVEF at 35 to 40%.  Grade 1 DD with diffuse hypokinesis.  No valvular abnormalities.  Repeat TTE 12/21 with EF 40 to 45%.  Seen by SRichardson Dopp6/2021 and was doing well.  No heart failure symptoms.  She was last seen by Dr. PJohney Frame2/2023 and she was feeling okay aside for some occasional SOB.  This was an ongoing symptom that may be worsening per the patient.  Her SOB occurred during walking or climbing stairs.  She denies any chest pain or lower extremity edema.  Blood pressure at home was well controlled.  2 prior back surgeries and was suffering from back pain which is chronic and bursitis in her hips bilaterally.  She lost 3 to 4 pounds by working on dietary changes.  Family history significant for her father who died at 89years old with heart failure her paternal uncles also died from heart failure at an early age.  Today, she tells me that she is feeling palpitations a few times a week.  She states it feels like fluttering in her chest.  She is not sure how long  it lasts.  She also states her legs get tired specially going up and down stairs.  She has not had an echocardiogram lately.  Her blood pressure is stable but her pulse rate is elevated at 92 bpm.  She denies chest pain and shortness of breath.  Reports no shortness of breath nor dyspnea on exertion. Reports no chest pain, pressure, or tightness. No edema, orthopnea, PND. Reports no palpitations.   Past Medical History    Past Medical History:  Diagnosis Date   Anemia    mild   Anxiety    Arthritis    Asthma    Cardiomyopathy ([facility]    CHF (congestive heart failure) ([facility]    Diabetes mellitus without complication ([facility]    Frequency of urination    at night   GERD (gastroesophageal reflux disease)    potassium worsens reflux   Hemorrhoids, external    Hyperlipidemia    Hypertension    Neuromuscular disorder (HCC)    neuropathy   Numbness and tingling of both legs    Rheumatoid arthritis ([facility]    Vertigo    hx of   Past Surgical History:  Procedure Laterality Date   ABDOMINAL HYSTERECTOMY     CARDIAC CATHETERIZATION N/A 10/28/2014   Procedure: Left Heart Cath and Coronary Angiography;  Surgeon: JJettie Booze MD;  [facility];  Service: Cardiovascular;  Laterality: N/A;   CARPAL TUNNEL RELEASE Left  COLONOSCOPY     pt states 11 yr ago in [place] had a colon with 2 polyps- one cecal polyp per pt. one ? location   COLONOSCOPY W/ POLYPECTOMY     CYST REMOVAL NECK     HEMORROIDECTOMY     MAXIMUM ACCESS (MAS)POSTERIOR LUMBAR INTERBODY FUSION (PLIF) 3 LEVEL N/A 10/08/2013   Procedure: FOR MAXIMUM ACCESS (MAS) POSTERIOR LUMBAR INTERBODY FUSION (PLIF) 3 LEVEL;  Surgeon: Erline Levine, MD;  Location: [facility];  Service: Neurosurgery;  Laterality: N/A;  L3-4 L4-5 L5-S1 maximum access posterior lumbar interbody fusion with decompression   TUBAL LIGATION      Allergies  Allergies  Allergen Reactions   Lisinopril Cough   EKGs/Labs/Other Studies Reviewed:    The following studies were reviewed today:  Right LE Venous Doppler 03/03/2021: Summary:  RIGHT:  - No evidence of deep vein thrombosis in the lower extremity. No indirect  evidence of obstruction proximal to the inguinal ligament.  - No cystic structure found in the popliteal fossa.  - 0.17 cm x 0.17 cm knot behind knee with no color flow; possible lipoma     LEFT:  - No evidence of common femoral vein obstruction.   TTE 04/2020: IMPRESSIONS    1. No left ventricular thrombus is seen. Left ventricular ejection  fraction, by estimation, is 40 to 45%. Left ventricular ejection fraction  is 43% by biplane modified Simpson's method. The left ventricle has mildly  decreased function. The left  ventricle demonstrates global hypokinesis. The left ventricular internal  cavity size was mildly dilated. Left ventricular diastolic parameters are  consistent with Grade I diastolic dysfunction (impaired relaxation).   2. Right ventricular systolic function is normal. The right ventricular  size is normal.   3. The mitral valve is normal in structure. No evidence of mitral valve  regurgitation. No evidence of mitral stenosis.   4. The aortic valve is normal in structure. Aortic valve regurgitation is  not visualized. No aortic stenosis is present.   5. The inferior vena cava is normal in size with greater than 50%  respiratory variability, suggesting right atrial pressure of 3 mmHg.   Comparison(s): Prior images reviewed side by side. The left ventricular  function has improved.    ABIs 11/2019 Normal   Echocardiogram 04/27/20 EF 40-45, Gr 1 DD, normal RVSF   Echocardiogram 12/25/19 EF 30-35, normal RVSF, trivial MR   Echocardiogram 04/02/2018 EF 35-40, moderate diffuse HK, grade 1 diastolic dysfunction, mildly calcified aortic valve leaflets, atrial septal lipomatous hypertrophy, trivial PI   ABIs 10/31/2016 Normal    Echocardiogram 04/10/2016 EF 35-40   Cardiac catheterization  10/28/2014 LAD mild diffuse disease RI irregularities LCx ostial 50, mid 25 RCA mild disease; RPDA 60 (small vessel) EF 35-45, LVEDP 18  EKG:  EKG is  not ordered today.    Recent Labs: 05/01/2021: TSH 2.980 02/06/2022: ALT 14; BUN 18; Creatinine, Ser 1.32; Hemoglobin 11.3; Platelets 222.0; Potassium 4.3; Sodium 141  Recent Lipid Panel    Component Value Date/Time   CHOL 140 02/06/2022 1124   TRIG 123.0 02/06/2022 1124   HDL 47.90 02/06/2022 1124   CHOLHDL 3 02/06/2022 1124   VLDL 24.6 02/06/2022 1124   LDLCALC 68 02/06/2022 1124   LDLDIRECT 162.5 01/30/2013 1602    Home Medications   Current Meds  Medication Sig   Accu-Chek FastClix Lancets MISC USE 1  TO CHECK GLUCOSE THREE TIMES DAILY   Albuterol Sulfate (PROAIR RESPICLICK) 935 (90 Base) MCG/ACT AEPB INHALE 2 PUFFS  BY MOUTH 4 TIMES DAILY AS NEEDED   allopurinol (ZYLOPRIM) 300 MG tablet Take 1 tablet by mouth once daily   aspirin EC 81 MG tablet Take 1 tablet (81 mg total) by mouth daily.   atorvastatin (LIPITOR) 40 MG tablet Take 1 tablet by mouth once daily   Biotin 10 MG TABS Take by mouth.   Blood Glucose Monitoring Suppl (ACCU-CHEK GUIDE) w/Device KIT 1 Act by Does not apply route 3 (three) times daily.   Calcium Carb-Cholecalciferol (CALCIUM 600 + D PO) Take 1 tablet by mouth daily.   Cinnamon 500 MG TABS Take by mouth.   citalopram (CELEXA) 20 MG tablet Take 1 tablet by mouth once daily   dapagliflozin propanediol (FARXIGA) 10 MG TABS tablet Take 1 tablet (10 mg total) by mouth daily before breakfast.   DEXILANT 60 MG capsule Take 1 capsule by mouth once daily   gabapentin (NEURONTIN) 600 MG tablet TAKE 1 TABLET BY MOUTH THREE TIMES DAILY   glucose blood (ACCU-CHEK GUIDE) test strip 1 each by Other route 3 (three) times daily.   Lancets Misc. (ACCU-CHEK FASTCLIX LANCET) KIT Inject 1 Act into the skin 3 (three) times daily.   leflunomide (ARAVA) 20 MG tablet Take 1 tablet by mouth once daily   Omega-3 Fatty Acids (FISH  OIL) 1200 MG CAPS Take 1,200 mg by mouth daily.    sacubitril-valsartan (ENTRESTO) 97-103 MG Take 1 tablet by mouth 2 (two) times daily.   spironolactone (ALDACTONE) 25 MG tablet Take 1/2 (one-half) tablet by mouth once daily   TRULICITY 6.07 PX/1.0GY SOPN INJECT 0.75MG SUBCUTANEOUSLY ONCE A WEEK   [DISCONTINUED] carvedilol (COREG) 6.25 MG tablet TAKE 1 TABLET BY MOUTH TWICE DAILY WITH A MEAL     Review of Systems      All other systems reviewed and are otherwise negative except as noted above.  Physical Exam    VS:  BP 128/70   Pulse 92   Ht _0  (1.626 m)   Wt 228 lb (103.4 kg)   LMP 05/29/1991   SpO2 98%   BMI 39.14 kg/m  , BMI Body mass index is 39.14 kg/m.  Wt Readings from Last 3 Encounters:  02/26/22 228 lb (103.4 kg)  02/16/22 229 lb (103.9 kg)  02/06/22 229 lb (103.9 kg)     GEN: Well nourished, well developed, in no acute distress. HEENT: normal. Neck: Supple, no JVD, carotid bruits, or masses. Cardiac: RRR, no murmurs, rubs, or gallops. No clubbing, cyanosis, edema.  Radials/PT 2+ and equal bilaterally.  Respiratory:  Respirations regular and unlabored, clear to auscultation bilaterally. GI: Soft, nontender, nondistended. MS: No deformity or atrophy. Skin: Warm and dry, no rash. Neuro:  Strength and sensation are intact. Psych: Normal affect.  Assessment & Plan    Palpitations -ZIO placed today -Plan for echocardiogram after ZIO is sent in -Recent labs 02/06/2022 showed potassium 4.3, creatinine 1.3, no magnesium, TSH 2.98 (69/48)  Chronic systolic heart failure with improved EF/nonischemic cardiomyopathy -Euvolemic on exam -Continue current medication regimen of aspirin 81 mg daily, Lipitor 40 mg daily, Coreg 6.25 mg twice a day (increased 12.5 mg twice a day), Farxiga 10 mg daily, omega-3 fatty acids 1200 mg daily, Entresto 97-103 mg twice daily, spironolactone 12.5 mg daily -tolerating all medications -Check echocardiogram following ZIO  Essential  hypertension -Continue current medication regimen -Well-controlled today at 128/70  Coronary artery disease -no angina -continue current medications  Hyperlipidemia -Recent LDL 68, HDL 47, total cholesterol 140, triglycerides 123 (02/06/2022) -Continue Lipitor  40 mg daily  Type 2 diabetes mellitus -A1c 6.6 -Continue current medications, per PCP       Disposition: Follow up 2-3 months with Freada Bergeron, MD or APP.  Signed, Elgie Collard, PA-C 02/26/2022, 1:24 PM [facility]

## 2022-02-26 ENCOUNTER — Ambulatory Visit: Payer: Medicare Other | Attending: Physician Assistant

## 2022-02-26 ENCOUNTER — Encounter: Payer: Self-pay | Admitting: Physician Assistant

## 2022-02-26 ENCOUNTER — Ambulatory Visit: Payer: Medicare Other | Attending: Physician Assistant | Admitting: Physician Assistant

## 2022-02-26 VITALS — BP 128/70 | HR 92 | Ht 64.0 in | Wt 228.0 lb

## 2022-02-26 DIAGNOSIS — I428 Other cardiomyopathies: Secondary | ICD-10-CM | POA: Diagnosis not present

## 2022-02-26 DIAGNOSIS — E782 Mixed hyperlipidemia: Secondary | ICD-10-CM

## 2022-02-26 DIAGNOSIS — Z79899 Other long term (current) drug therapy: Secondary | ICD-10-CM | POA: Diagnosis not present

## 2022-02-26 DIAGNOSIS — R0602 Shortness of breath: Secondary | ICD-10-CM

## 2022-02-26 DIAGNOSIS — I10 Essential (primary) hypertension: Secondary | ICD-10-CM | POA: Diagnosis not present

## 2022-02-26 DIAGNOSIS — R002 Palpitations: Secondary | ICD-10-CM | POA: Diagnosis not present

## 2022-02-26 DIAGNOSIS — I5020 Unspecified systolic (congestive) heart failure: Secondary | ICD-10-CM

## 2022-02-26 DIAGNOSIS — I25118 Atherosclerotic heart disease of native coronary artery with other forms of angina pectoris: Secondary | ICD-10-CM

## 2022-02-26 DIAGNOSIS — E118 Type 2 diabetes mellitus with unspecified complications: Secondary | ICD-10-CM

## 2022-02-26 MED ORDER — CARVEDILOL 12.5 MG PO TABS: 12.5000 mg | ORAL_TABLET | Freq: Two times a day (BID) | ORAL | 3 refills | Status: DC

## 2022-02-26 NOTE — Progress Notes (Unsigned)
[id number] from office inventory applied to patient.  Dr. Johney Frame to read.

## 2022-02-26 NOTE — Patient Instructions (Signed)
Medication Instructions:  1.Increase carvedilol to 12.5 mg twice daily *If you need a refill on your cardiac medications before your next appointment, please call your pharmacy*   Lab Work: None ordered If you have labs (blood work) drawn today and your tests are completely normal, you will receive your results only by: [place] (if you have MyChart) OR A paper copy in the mail If you have any lab test that is abnormal or we need to change your treatment, we will call you to review the results.   Testing/Procedures: Your physician has requested that you have an echocardiogram. Echocardiography is a painless test that uses sound waves to create images of your heart. It provides your doctor with information about the size and shape of your heart and how well your heart's chambers and valves are working. This procedure takes approximately one hour. There are no restrictions for this procedure.   ZIO XT- Long Term Monitor Instructions  Your physician has requested you wear a ZIO patch monitor for 7 days.  This is a single patch monitor. [employer] supplies one patch monitor per enrollment. Additional stickers are not available. Please do not apply patch if you will be having a Nuclear Stress Test,  Echocardiogram, Cardiac CT, MRI, or Chest Xray during the period you would be wearing the  monitor. The patch cannot be worn during these tests. You cannot remove and re-apply the  ZIO XT patch monitor.  Your ZIO patch monitor will be mailed 3 day USPS to your address on file. It may take 3-5 days  to receive your monitor after you have been enrolled.  Once you have received your monitor, please review the enclosed instructions. Your monitor  has already been registered assigning a specific monitor serial # to you.  Billing and Patient Assistance Program Information  We have supplied [employer] with any of your insurance information on file for billing purposes. [employer] offers a sliding scale  Patient Assistance Program for patients that do not have  insurance, or whose insurance does not completely cover the cost of the ZIO monitor.  You must apply for the Patient Assistance Program to qualify for this discounted rate.  To apply, please call [employer] at [phone], select option 4, select option 2, ask to apply for  Patient Assistance Program. Theodore Demark will ask your household income, and how many people  are in your household. They will quote your out-of-pocket cost based on that information.  [employer] will also be able to set up a 52-month, interest-free payment plan if needed.  Applying the monitor   Shave hair from upper left chest.  Hold abrader disc by orange tab. Rub abrader in 40 strokes over the upper left chest as  indicated in your monitor instructions.  Clean area with 4 enclosed alcohol pads. Let dry.  Apply patch as indicated in monitor instructions. Patch will be placed under collarbone on left  side of chest with arrow pointing upward.  Rub patch adhesive wings for 2 minutes. Remove white label marked "1". Remove the white  label marked "2". Rub patch adhesive wings for 2 additional minutes.  While looking in a mirror, press and release button in center of patch. A small green light will  flash 3-4 times. This will be your only indicator that the monitor has been turned on.  Do not shower for the first 24 hours. You may shower after the first 24 hours.  Press the button if you feel a symptom. You will hear a  small click. Record Date, Time and  Symptom in the Patient Logbook.  When you are ready to remove the patch, follow instructions on the last 2 pages of Patient  Logbook. Stick patch monitor onto the last page of Patient Logbook.  Place Patient Logbook in the blue and white box. Use locking tab on box and tape box closed  securely. The blue and white box has prepaid postage on it. Please place it in the mailbox as  soon as possible. Your physician should have  your test results approximately 7 days after the  monitor has been mailed back to [facility].  Call [facility] Customer Care at [phone] if you have questions regarding  your ZIO XT patch monitor. Call them immediately if you see an orange light blinking on your  monitor.  If your monitor falls off in less than 4 days, contact our Monitor department at [phone].  If your monitor becomes loose or falls off after 4 days call [employer] at [phone] for  suggestions on securing your monitor   Follow-Up: At [facility], you and your health needs are our priority.  As part of our continuing mission to provide you with exceptional heart care, we have created designated Provider Care Teams.  These Care Teams include your primary Cardiologist (physician) and Advanced Practice Providers (APPs -  Physician Assistants and Nurse Practitioners) who all work together to provide you with the care you need, when you need it.  Your next appointment:   2-3 month(s)  The format for your next appointment:   In Person  Provider:   Meriam Sprague, MD     Important Information About Sugar

## 2022-02-27 ENCOUNTER — Telehealth: Payer: Self-pay | Admitting: Hematology and Oncology

## 2022-02-27 NOTE — Telephone Encounter (Signed)
R/s pt's new hem appt per pt request. Pt is aware of new appt date/time.  

## 2022-03-06 ENCOUNTER — Encounter: Payer: Medicare Other | Admitting: Hematology and Oncology

## 2022-03-06 ENCOUNTER — Other Ambulatory Visit: Payer: Self-pay | Admitting: Internal Medicine

## 2022-03-08 ENCOUNTER — Telehealth: Payer: Medicare Other

## 2022-03-13 ENCOUNTER — Other Ambulatory Visit: Payer: Self-pay | Admitting: Internal Medicine

## 2022-03-13 DIAGNOSIS — E118 Type 2 diabetes mellitus with unspecified complications: Secondary | ICD-10-CM

## 2022-03-14 DIAGNOSIS — R002 Palpitations: Secondary | ICD-10-CM | POA: Diagnosis not present

## 2022-03-16 ENCOUNTER — Other Ambulatory Visit: Payer: Self-pay | Admitting: Internal Medicine

## 2022-03-16 DIAGNOSIS — I10 Essential (primary) hypertension: Secondary | ICD-10-CM

## 2022-03-21 ENCOUNTER — Encounter: Payer: Self-pay | Admitting: Cardiology

## 2022-03-21 ENCOUNTER — Ambulatory Visit (HOSPITAL_COMMUNITY): Payer: Medicare Other | Attending: Physician Assistant

## 2022-03-21 ENCOUNTER — Telehealth: Payer: Self-pay | Admitting: Physician Assistant

## 2022-03-21 DIAGNOSIS — I428 Other cardiomyopathies: Secondary | ICD-10-CM | POA: Insufficient documentation

## 2022-03-21 LAB — ECHOCARDIOGRAM COMPLETE
Area-P 1/2: 4.8 cm2
S' Lateral: 3.6 cm

## 2022-03-21 NOTE — Telephone Encounter (Signed)
Error

## 2022-03-21 NOTE — Telephone Encounter (Signed)
See result note.  

## 2022-03-21 NOTE — Telephone Encounter (Signed)
Follow Up:      Patient is returning Emily Wheeler's call from today, concerning her Monitor results.

## 2022-03-23 ENCOUNTER — Other Ambulatory Visit: Payer: Self-pay | Admitting: Family Medicine

## 2022-03-23 ENCOUNTER — Other Ambulatory Visit: Payer: Self-pay | Admitting: Internal Medicine

## 2022-03-23 DIAGNOSIS — E118 Type 2 diabetes mellitus with unspecified complications: Secondary | ICD-10-CM

## 2022-03-27 ENCOUNTER — Inpatient Hospital Stay: Payer: Medicare Other

## 2022-03-27 ENCOUNTER — Inpatient Hospital Stay: Payer: Medicare Other | Attending: Hematology and Oncology | Admitting: Hematology and Oncology

## 2022-03-27 DIAGNOSIS — Z7982 Long term (current) use of aspirin: Secondary | ICD-10-CM | POA: Insufficient documentation

## 2022-03-27 DIAGNOSIS — Z79899 Other long term (current) drug therapy: Secondary | ICD-10-CM | POA: Diagnosis not present

## 2022-03-27 DIAGNOSIS — Z7985 Long-term (current) use of injectable non-insulin antidiabetic drugs: Secondary | ICD-10-CM | POA: Diagnosis not present

## 2022-03-27 DIAGNOSIS — Z7984 Long term (current) use of oral hypoglycemic drugs: Secondary | ICD-10-CM | POA: Diagnosis not present

## 2022-03-27 DIAGNOSIS — D638 Anemia in other chronic diseases classified elsewhere: Secondary | ICD-10-CM

## 2022-03-27 DIAGNOSIS — Z7969 Long term (current) use of other immunomodulators and immunosuppressants: Secondary | ICD-10-CM | POA: Diagnosis not present

## 2022-03-27 DIAGNOSIS — D72819 Decreased white blood cell count, unspecified: Secondary | ICD-10-CM | POA: Insufficient documentation

## 2022-03-27 LAB — CBC WITH DIFFERENTIAL (CANCER CENTER ONLY)
Abs Immature Granulocytes: 0 10*3/uL (ref 0.00–0.07)
Basophils Absolute: 0 10*3/uL (ref 0.0–0.1)
Basophils Relative: 1 %
Eosinophils Absolute: 0.2 10*3/uL (ref 0.0–0.5)
Eosinophils Relative: 5 %
HCT: 32.3 % — ABNORMAL LOW (ref 36.0–46.0)
Hemoglobin: 10.7 g/dL — ABNORMAL LOW (ref 12.0–15.0)
Immature Granulocytes: 0 %
Lymphocytes Relative: 40 %
Lymphs Abs: 1.6 10*3/uL (ref 0.7–4.0)
MCH: 32.5 pg (ref 26.0–34.0)
MCHC: 33.1 g/dL (ref 30.0–36.0)
MCV: 98.2 fL (ref 80.0–100.0)
Monocytes Absolute: 0.5 10*3/uL (ref 0.1–1.0)
Monocytes Relative: 14 %
Neutro Abs: 1.6 10*3/uL — ABNORMAL LOW (ref 1.7–7.7)
Neutrophils Relative %: 40 %
Platelet Count: 247 10*3/uL (ref 150–400)
RBC: 3.29 MIL/uL — ABNORMAL LOW (ref 3.87–5.11)
RDW: 15.6 % — ABNORMAL HIGH (ref 11.5–15.5)
WBC Count: 3.9 10*3/uL — ABNORMAL LOW (ref 4.0–10.5)
nRBC: 0 % (ref 0.0–0.2)

## 2022-03-27 NOTE — Assessment & Plan Note (Signed)
Lab review 07/26/2018: Hemoglobin 9.6, WBC 3.9, ANC 1.3, MCV 95.6, platelet 274, Reticulocytes: 1.5%, absolute reticulocyte 47.8, immature reticulocyte fraction 16.7% which is elevated, LDH 157, DAT negative, haptoglobin 227 Erythropoietin 30.7 SPEP: No M protein 02/06/2022: Hemoglobin 11.3, MCV 100.1, B12 greater than 1500, folate greater than 23.9, creatinine 1.32  Bone marrow biopsy 08/19/2018: Slightly hypercellular marrow for age with trilineage hematopoiesis nonspecific findings, flow cytometry and cytogenetics were normal  Discussion: Her labs have improved significantly from when we saw her in 2020.  Therefore we can attribute anemia to anemia of chronic kidney disease. Treatment plan: Observation and to be sent her back to [place] if the hemoglobin drops below 10 to consider erythropoietin stimulating agent therapies.

## 2022-03-27 NOTE — Progress Notes (Signed)
Patient Care Team: [name], MD as PCP - General (Internal Medicine) Freada Bergeron, MD as PCP - Cardiology (Cardiology) Opthamology, [facility] as Consulting Physician (Ophthalmology) Tomasa Blase, [facility] (Inactive) (Pharmacist)  DIAGNOSIS: Follow-up of leukopenia and anemia of chronic disease Encounter Diagnosis  Name Primary?   Anemia of chronic disease       CHIEF COMPLIANT: Patient was referred back to see [place] because of leukopenia  INTERVAL HISTORY: Emily Wheeler is a 66 year old above-mentioned history of chronic anemia for which we have done extensive work-up including a bone marrow biopsy.  The bone marrow biopsy did not show any myelodysplastic changes or bone marrow disorders.  She was sent back to [place] because her white blood cell count which had been relatively stable has come down to 3.4.  The differential on the white count was normal.  The hemoglobin interestingly has improved to 11.3.  She does not report any recent infections or illnesses.  She does take substantial number of medications.   Denies any recent changes to her medication.   ALLERGIES:  is allergic to lisinopril.  MEDICATIONS:  Current Outpatient Medications  Medication Sig Dispense Refill   Accu-Chek FastClix Lancets MISC USE 1  TO CHECK GLUCOSE THREE TIMES DAILY 306 each 0   Albuterol Sulfate (PROAIR RESPICLICK) 878 (90 Base) MCG/ACT AEPB INHALE 2 PUFFS BY MOUTH 4 TIMES DAILY AS NEEDED 1 each 3   allopurinol (ZYLOPRIM) 300 MG tablet Take 1 tablet by mouth once daily 90 tablet 0   aspirin EC 81 MG tablet Take 1 tablet (81 mg total) by mouth daily. 90 tablet 3   atorvastatin (LIPITOR) 40 MG tablet Take 1 tablet by mouth once daily 90 tablet 0   Biotin 10 MG TABS Take by mouth.     Blood Glucose Monitoring Suppl (ACCU-CHEK GUIDE) w/Device KIT 1 Act by Does not apply route 3 (three) times daily. 2 kit 2   Calcium Carb-Cholecalciferol (CALCIUM 600 + D PO) Take 1 tablet by mouth daily.      carvedilol (COREG) 12.5 MG tablet Take 1 tablet (12.5 mg total) by mouth 2 (two) times daily. 180 tablet 3   Cinnamon 500 MG TABS Take by mouth.     citalopram (CELEXA) 20 MG tablet Take 1 tablet by mouth once daily 90 tablet 0   DEXILANT 60 MG capsule Take 1 capsule by mouth once daily 90 capsule 0   FARXIGA 10 MG TABS tablet TAKE 1 TABLET BY MOUTH ONCE DAILY BEFORE BREAKFAST 90 tablet 0   gabapentin (NEURONTIN) 600 MG tablet TAKE 1 TABLET BY MOUTH THREE TIMES DAILY 270 tablet 0   glucose blood (ACCU-CHEK GUIDE) test strip 1 each by Other route 3 (three) times daily. 300 each 1   Lancets Misc. (ACCU-CHEK FASTCLIX LANCET) KIT Inject 1 Act into the skin 3 (three) times daily. 306 kit 1   leflunomide (ARAVA) 20 MG tablet Take 1 tablet by mouth once daily 90 tablet 0   Omega-3 Fatty Acids (FISH OIL) 1200 MG CAPS Take 1,200 mg by mouth daily.      sacubitril-valsartan (ENTRESTO) 97-103 MG Take 1 tablet by mouth 2 (two) times daily. 180 tablet 3   spironolactone (ALDACTONE) 25 MG tablet Take 1/2 (one-half) tablet by mouth once daily 45 tablet 1   TRULICITY 6.76 HM/0.9OB SOPN INJECT 0.75 MG  SUBCUTANEOUSLY ONCE A WEEK 4 mL 0   No current facility-administered medications for this visit.    PHYSICAL EXAMINATION: ECOG PERFORMANCE STATUS: 1 -  Symptomatic but completely ambulatory  Vitals:   03/27/22 1047  BP: (!) 159/74  Pulse: (!) 114  Resp: 19  Temp: (!) 97.3 F (36.3 C)  SpO2: 98%   Filed Weights   03/27/22 1047  Weight: 229 lb 6.4 oz (104.1 kg)      LABORATORY DATA:  I have reviewed the data as listed    Latest Ref Rng & Units 02/06/2022   11:24 AM 05/17/2021   11:03 AM 03/02/2021    2:23 PM  CMP  Glucose 70 - 99 mg/dL 122  169  94   BUN 6 - 23 mg/dL _0 Creatinine 0.40 - 1.20 mg/dL 1.32  1.23  1.28   Sodium 135 - 145 mEq/L 141  142  139   Potassium 3.5 - 5.1 mEq/L 4.3  4.2  4.0   Chloride 96 - 112 mEq/L 105  107  106   CO2 19 - 32 mEq/L _1 Calcium 8.4 -  10.5 mg/dL 9.3  9.4  9.3   Total Protein 6.0 - 8.3 g/dL 7.0   6.6   Total Bilirubin 0.2 - 1.2 mg/dL 0.4   0.5   Alkaline Phos 39 - 117 U/L 79   66   AST 0 - 37 U/L 15   20   ALT 0 - 35 U/L 14   17     Lab Results  Component Value Date   WBC 3.4 (L) 02/06/2022   HGB 11.3 (L) 02/06/2022   HCT 34.5 (L) 02/06/2022   MCV 100.1 (H) 02/06/2022   PLT 222.0 02/06/2022   NEUTROABS 1.5 02/06/2022    ASSESSMENT & PLAN:  Anemia of chronic disease and leukopenia Lab review 07/26/2018: Hemoglobin 9.6, WBC 3.9, ANC 1.3, MCV 95.6, platelet 274, Reticulocytes: 1.5%, absolute reticulocyte 47.8, immature reticulocyte fraction 16.7% which is elevated, LDH 157, DAT negative, haptoglobin 227 Erythropoietin 30.7 SPEP: No M protein 02/06/2022: Hemoglobin 11.3, MCV 100.1, B12 greater than 1500, folate greater than 23.9, creatinine 1.32  Bone marrow biopsy 08/19/2018: Slightly hypercellular marrow for age with trilineage hematopoiesis nonspecific findings, flow cytometry and cytogenetics were normal  Discussion: I discussed with the patient the differential diagnosis of leukopenia.  We ruled out medications and bone marrow disorders from the causes.  We would like to rule out autoimmune conditions and I will send for lupus testing.  I will also recheck her CBC with differential today. At the end of the day it could be an ethnicity related to leukopenia and no treatment would be necessary for that.  I will call her tomorrow with the result of this test.   Orders Placed This Encounter  Procedures   CBC with Differential ([place] Only)    Standing Status:   Future    Standing Expiration Date:   03/28/2023   ANA, IFA (with reflex)    Standing Status:   Future    Standing Expiration Date:   03/27/2023   The patient has a good understanding of the overall plan. she agrees with it. she will call with any problems that may develop before the next visit here. Total time spent: 30 mins including face to  face time and time spent for planning, charting and co-ordination of care   Harriette Ohara, MD 03/27/22

## 2022-03-28 ENCOUNTER — Other Ambulatory Visit: Payer: Self-pay | Admitting: *Deleted

## 2022-03-28 DIAGNOSIS — I428 Other cardiomyopathies: Secondary | ICD-10-CM

## 2022-03-28 DIAGNOSIS — I5022 Chronic systolic (congestive) heart failure: Secondary | ICD-10-CM

## 2022-03-28 LAB — ANTINUCLEAR ANTIBODIES, IFA: ANA Ab, IFA: NEGATIVE

## 2022-04-03 NOTE — [place] Notes (Deleted)
[place] Phone: [phone] Subjective:    I'm seeing this patient by the request  of:  Nicholas Lose, MD  CC:   [id number]  02/16/2022 Severe spinal stenosis as well as with patient having rheumatoid arthritis.  Patient does have some instability noted but likely of the fusion the patient has had.  Still wants to avoid surgical intervention.  Had unfortunately significant 3 days of pain after the last epidural.  We will continue to monitor.  Discussed icing regimen and home exercises.  No significant change in medications but I do feel we should consider the possibility of transitioning patient's Celexa to Cymbalta.  Follow-up again in 6 to 8 weeks.   Viscosupplementation given today, tolerated the procedure well.  Patient does have end-stage osteoarthritic changes but due to other comorbidities trying to avoid any type of surgical intervention.  Follow-up again in 6 weeks       Update 04/04/2022 Emily Wheeler is a 66 y.o. female coming in with complaint of B knee and LBP. Patient states        Past Medical History:  Diagnosis Date   Anemia    mild   Anxiety    Arthritis    Asthma    Cardiomyopathy ([place])    CHF (congestive heart failure) ([place])    Diabetes mellitus without complication (HCC)    Frequency of urination    at night   GERD (gastroesophageal reflux disease)    potassium worsens reflux   Hemorrhoids, external    Hyperlipidemia    Hypertension    Neuromuscular disorder (HCC)    neuropathy   Numbness and tingling of both legs    Rheumatoid arthritis ([place])    Vertigo    hx of   Past Surgical History:  Procedure Laterality Date   ABDOMINAL HYSTERECTOMY     CARDIAC CATHETERIZATION N/A 10/28/2014   Procedure: Left Heart Cath and Coronary Angiography;  Surgeon: Jettie Booze, MD;  Location: [facility];  Service: Cardiovascular;  Laterality: N/A;   CARPAL TUNNEL RELEASE  Left    COLONOSCOPY     pt states 11 yr ago in [place] had a colon with 2 polyps- one cecal polyp per pt. one ? location   COLONOSCOPY W/ POLYPECTOMY     CYST REMOVAL NECK     HEMORROIDECTOMY     MAXIMUM ACCESS (MAS)POSTERIOR LUMBAR INTERBODY FUSION (PLIF) 3 LEVEL N/A 10/08/2013   Procedure: FOR MAXIMUM ACCESS (MAS) POSTERIOR LUMBAR INTERBODY FUSION (PLIF) 3 LEVEL;  Surgeon: Erline Levine, MD;  Location: [facility];  Service: Neurosurgery;  Laterality: N/A;  L3-4 L4-5 L5-S1 maximum access posterior lumbar interbody fusion with decompression   TUBAL LIGATION     Social History   Socioeconomic History   Marital status: Single    Spouse name: Not on file   Number of children: Not on file   Years of education: Not on file   Highest education level: Not on file  Occupational History   Not on file  Tobacco Use   Smoking status: Former    Passive exposure: Current   Smokeless tobacco: Never  Vaping Use   Vaping Use: Never used  Substance and Sexual Activity   Alcohol use: Not Currently   Drug use: No    Types: Marijuana   Sexual activity: Not Currently  Other Topics Concern   Not on file  Social History Narrative   Not on file  Social Determinants of Health   Financial Resource Strain: Low Risk  (07/27/2021)   Overall Financial Resource Strain (CARDIA)    Difficulty of Paying Living Expenses: Not hard at all  Food Insecurity: No Food Insecurity (07/27/2021)   Hunger Vital Sign    Worried About Running Out of Food in the Last Year: Never true    Ran Out of Food in the Last Year: Never true  Transportation Needs: No Transportation Needs (07/27/2021)   PRAPARE - Hydrologist (Medical): No    Lack of Transportation (Non-Medical): No  Physical Activity: Inactive (07/27/2021)   Exercise Vital Sign    Days of Exercise per Week: 0 days    Minutes of Exercise per Session: 0 min  Stress: No Stress Concern Present (07/27/2021)   [place]    Feeling of Stress : Not at all  Social Connections: Moderately Integrated (07/19/2020)   Social Connection and Isolation Panel [NHANES]    Frequency of Communication with Friends and Family: More than three times a week    Frequency of Social Gatherings with Friends and Family: More than three times a week    Attends Religious Services: More than 4 times per year    Active Member of Clubs or Organizations: No    Attends Music therapist: More than 4 times per year    Marital Status: Widowed   Allergies  Allergen Reactions   Lisinopril Cough   Family History  Problem Relation Age of Onset   Early death Father    Heart disease Father    Hypertension Sister    Hypertension Brother    Diabetes Brother    Colon polyps Mother    Hashimoto's thyroiditis Sister    Colon cancer Maternal Grandmother        in her 31s   Stomach cancer Maternal Aunt    Non-Hodgkin's lymphoma Sister    Alcohol abuse Neg Hx    COPD Neg Hx    Depression Neg Hx    Drug abuse Neg Hx    Hearing loss Neg Hx    Hyperlipidemia Neg Hx    Kidney disease Neg Hx    Stroke Neg Hx    Esophageal cancer Neg Hx    Rectal cancer Neg Hx     Current Outpatient Medications (Endocrine & Metabolic):    FARXIGA 10 MG TABS tablet, TAKE 1 TABLET BY MOUTH ONCE DAILY BEFORE BREAKFAST   TRULICITY 1.93 XT/0.2IO SOPN, INJECT 0.75 MG  SUBCUTANEOUSLY ONCE A WEEK  Current Outpatient Medications (Cardiovascular):    atorvastatin (LIPITOR) 40 MG tablet, Take 1 tablet by mouth once daily   carvedilol (COREG) 12.5 MG tablet, Take 1 tablet (12.5 mg total) by mouth 2 (two) times daily.   sacubitril-valsartan (ENTRESTO) 97-103 MG, Take 1 tablet by mouth 2 (two) times daily.   spironolactone (ALDACTONE) 25 MG tablet, Take 1/2 (one-half) tablet by mouth once daily  Current Outpatient Medications (Respiratory):    Albuterol Sulfate (PROAIR RESPICLICK) 973 (90 Base) MCG/ACT AEPB,  INHALE 2 PUFFS BY MOUTH 4 TIMES DAILY AS NEEDED  Current Outpatient Medications (Analgesics):    allopurinol (ZYLOPRIM) 300 MG tablet, Take 1 tablet by mouth once daily   aspirin EC 81 MG tablet, Take 1 tablet (81 mg total) by mouth daily.   leflunomide (ARAVA) 20 MG tablet, Take 1 tablet by mouth once daily   Current Outpatient Medications (Other):    Accu-Chek FastClix  Lancets MISC, USE 1  TO CHECK GLUCOSE THREE TIMES DAILY   Biotin 10 MG TABS, Take by mouth.   Blood Glucose Monitoring Suppl (ACCU-CHEK GUIDE) w/Device KIT, 1 Act by Does not apply route 3 (three) times daily.   Calcium Carb-Cholecalciferol (CALCIUM 600 + D PO), Take 1 tablet by mouth daily.   Cinnamon 500 MG TABS, Take by mouth.   citalopram (CELEXA) 20 MG tablet, Take 1 tablet by mouth once daily   DEXILANT 60 MG capsule, Take 1 capsule by mouth once daily   gabapentin (NEURONTIN) 600 MG tablet, TAKE 1 TABLET BY MOUTH THREE TIMES DAILY   glucose blood (ACCU-CHEK GUIDE) test strip, 1 each by Other route 3 (three) times daily.   Lancets Misc. (ACCU-CHEK FASTCLIX LANCET) KIT, Inject 1 Act into the skin 3 (three) times daily.   Omega-3 Fatty Acids (FISH OIL) 1200 MG CAPS, Take 1,200 mg by mouth daily.    Reviewed prior external information including notes and imaging from  primary care provider As well as notes that were available from care everywhere and other healthcare systems.  Past medical history, social, surgical and family history all reviewed in electronic medical record.  No pertanent information unless stated regarding to the chief complaint.   Review of Systems:  No headache, visual changes, nausea, vomiting, diarrhea, constipation, dizziness, abdominal pain, skin rash, fevers, chills, night sweats, weight loss, swollen lymph nodes, body aches, joint swelling, chest pain, shortness of breath, mood changes. POSITIVE muscle aches  Objective  Last menstrual period 05/29/1991.   General: No apparent distress  alert and oriented x3 mood and affect normal, dressed appropriately.  HEENT: Pupils equal, extraocular movements intact  Respiratory: Patient's speak in full sentences and does not appear short of breath  Cardiovascular: No lower extremity edema, non tender, no erythema      Impression and Recommendations:

## 2022-04-04 ENCOUNTER — Ambulatory Visit: Payer: Medicare Other | Admitting: Family Medicine

## 2022-04-09 ENCOUNTER — Other Ambulatory Visit: Payer: Self-pay | Admitting: Internal Medicine

## 2022-04-09 DIAGNOSIS — E118 Type 2 diabetes mellitus with unspecified complications: Secondary | ICD-10-CM

## 2022-05-09 ENCOUNTER — Other Ambulatory Visit: Payer: Self-pay | Admitting: Internal Medicine

## 2022-05-09 DIAGNOSIS — K219 Gastro-esophageal reflux disease without esophagitis: Secondary | ICD-10-CM

## 2022-05-12 ENCOUNTER — Other Ambulatory Visit: Payer: Self-pay | Admitting: Internal Medicine

## 2022-05-12 DIAGNOSIS — E118 Type 2 diabetes mellitus with unspecified complications: Secondary | ICD-10-CM

## 2022-05-18 ENCOUNTER — Other Ambulatory Visit: Payer: Self-pay | Admitting: Internal Medicine

## 2022-05-18 DIAGNOSIS — M069 Rheumatoid arthritis, unspecified: Secondary | ICD-10-CM

## 2022-05-29 NOTE — [facility] Notes (Deleted)
Cardiology Office Note:    Date:  05/29/2022   ID:  BERDENE ASKARI, DOB [birth year], MRN [id number]  PCP:  Nicholas Lose, MD   [facility] Providers Cardiologist:  Freada Bergeron, MD {   Referring MD: [name], MD    History of Present Illness:    Emily Wheeler is a 67 y.o. female with a hx of HFrEF with improved EF, mild-to-mod coronary artery disease, DMII, RA, HTN and HLD who now presents to clinic for follow-up.  She has a history of nonischemic cardiomyopathy that was diagnosed in 2016. She initially presented with a complaint of dyspnea. Echocardiogram in April 2016 showed reduced EF at 35-40%. She initially underwent a nuclear stress test which was abnormal, leading to cardiac catheterization. Cardiac cath in June 2016 showed mild CAD thus a diagnosis of nonischemic cardiomyopathy was made. TTE 04/10/2016 showed reduced left ventricular ejection fraction at 35-40%. Grade 1 diastolic dysfunction was also noted with diffuse hypokinesis. No valvular abnormalities were noted.  Repeat TTE 04/2020 with EF 40-45%.  Last saw Richardson Dopp 10/2019 where she was doing well. No HF symptoms.  Today, the patient states that she is feeling okay aside from some occasional shortness of breath. This is an ongoing symptom that she states may be worsening. Her SOB usually occurs when walking or climbing stairs. She denies any chest pain or LE edema.  At home her blood pressure is generally well controlled. Last week at another clinic visit it was reportedly 932 systolic.  She has had 2 prior back surgeries, and currently suffers from chronic back pain as well as bursitis in her hips bilaterally. These symptoms limit her formal exercise significantly.  Lately she has lost 3-4 lbs by working on dietary changes. She has some difficulty with weight loss due to receiving steroidal injections for her pain management.  She denies any palpitations, lightheadedness, headaches, syncope,  orthopnea, or PND.  In her family, her father died at 22 yo from heart failure. Her paternal uncles also died from heart failure at an early age.  Past Medical History:  Diagnosis Date   Anemia    mild   Anxiety    Arthritis    Asthma    Cardiomyopathy ([place])    CHF (congestive heart failure) (HCC)    Diabetes mellitus without complication (HCC)    Frequency of urination    at night   GERD (gastroesophageal reflux disease)    potassium worsens reflux   Hemorrhoids, external    Hyperlipidemia    Hypertension    Neuromuscular disorder (HCC)    neuropathy   Numbness and tingling of both legs    Rheumatoid arthritis ([place])    Vertigo    hx of    Past Surgical History:  Procedure Laterality Date   ABDOMINAL HYSTERECTOMY     CARDIAC CATHETERIZATION N/A 10/28/2014   Procedure: Left Heart Cath and Coronary Angiography;  Surgeon: Jettie Booze, MD;  Location: [facility];  Service: Cardiovascular;  Laterality: N/A;   CARPAL TUNNEL RELEASE Left    COLONOSCOPY     pt states 11 yr ago in [place] had a colon with 2 polyps- one cecal polyp per pt. one ? location   COLONOSCOPY W/ POLYPECTOMY     CYST REMOVAL NECK     HEMORROIDECTOMY     MAXIMUM ACCESS (MAS)POSTERIOR LUMBAR INTERBODY FUSION (PLIF) 3 LEVEL N/A 10/08/2013   Procedure: FOR MAXIMUM ACCESS (MAS) POSTERIOR LUMBAR INTERBODY FUSION (PLIF) 3 LEVEL;  Surgeon: Maeola Harman, MD;  Location: [facility];  Service: Neurosurgery;  Laterality: N/A;  L3-4 L4-5 L5-S1 maximum access posterior lumbar interbody fusion with decompression   TUBAL LIGATION      Current Medications: No outpatient medications have been marked as taking for the 06/05/22 encounter (Appointment) with Meriam Sprague, MD.     Allergies:   Lisinopril   Social History   Socioeconomic History   Marital status: Single    Spouse name: Not on file   Number of children: Not on file   Years of education: Not on file   Highest education level: Not on file   Occupational History   Not on file  Tobacco Use   Smoking status: Former    Passive exposure: Current   Smokeless tobacco: Never  Vaping Use   Vaping Use: Never used  Substance and Sexual Activity   Alcohol use: Not Currently   Drug use: No    Types: Marijuana   Sexual activity: Not Currently  Other Topics Concern   Not on file  Social History Narrative   Not on file   Social Determinants of Health   Financial Resource Strain: Low Risk  (07/27/2021)   Overall Financial Resource Strain (CARDIA)    Difficulty of Paying Living Expenses: Not hard at all  Food Insecurity: No Food Insecurity (07/27/2021)   Hunger Vital Sign    Worried About Running Out of Food in the Last Year: Never true    Ran Out of Food in the Last Year: Never true  Transportation Needs: No Transportation Needs (07/27/2021)   PRAPARE - Administrator, Civil Service (Medical): No    Lack of Transportation (Non-Medical): No  Physical Activity: Inactive (07/27/2021)   Exercise Vital Sign    Days of Exercise per Week: 0 days    Minutes of Exercise per Session: 0 min  Stress: No Stress Concern Present (07/27/2021)   [name] of Occupational Health - Occupational Stress Questionnaire    Feeling of Stress : Not at all  Social Connections: Moderately Integrated (07/19/2020)   Social Connection and Isolation Panel [NHANES]    Frequency of Communication with Friends and Family: More than three times a week    Frequency of Social Gatherings with Friends and Family: More than three times a week    Attends Religious Services: More than 4 times per year    Active Member of Clubs or Organizations: No    Attends Engineer, structural: More than 4 times per year    Marital Status: Widowed     Family History: The patient's family history includes Colon cancer in her maternal grandmother; Colon polyps in her mother; Diabetes in her brother; Early death in her father; Hashimoto's thyroiditis in her  sister; Heart disease in her father; Hypertension in her brother and sister; Non-Hodgkin's lymphoma in her sister; Stomach cancer in her maternal aunt. There is no history of Alcohol abuse, COPD, Depression, Drug abuse, Hearing loss, Hyperlipidemia, Kidney disease, Stroke, Esophageal cancer, or Rectal cancer.  ROS:   Review of Systems  Constitutional:  Negative for chills and fever.  HENT:  Negative for ear pain and nosebleeds.   Eyes:  Negative for blurred vision.  Respiratory:  Positive for shortness of breath. Negative for cough and sputum production.   Cardiovascular:  Negative for chest pain, palpitations, orthopnea, claudication, leg swelling and PND.  Gastrointestinal:  Negative for nausea and vomiting.  Genitourinary:  Negative for dysuria.  Musculoskeletal:  Positive  for back pain and joint pain. Negative for neck pain.  Neurological:  Negative for dizziness and weakness.  Endo/Heme/Allergies:  Negative for polydipsia.  Psychiatric/Behavioral:  Negative for hallucinations and substance abuse.      EKGs/Labs/Other Studies Reviewed:    The following studies were reviewed today:  Right LE Venous Doppler 03/03/2021: Summary:  RIGHT:  - No evidence of deep vein thrombosis in the lower extremity. No indirect  evidence of obstruction proximal to the inguinal ligament.  - No cystic structure found in the popliteal fossa.  - 0.17 cm x 0.17 cm knot behind knee with no color flow; possible lipoma     LEFT:  - No evidence of common femoral vein obstruction.  TTE 04/2020: IMPRESSIONS    1. No left ventricular thrombus is seen. Left ventricular ejection  fraction, by estimation, is 40 to 45%. Left ventricular ejection fraction  is 43% by biplane modified Simpson's method. The left ventricle has mildly  decreased function. The left  ventricle demonstrates global hypokinesis. The left ventricular internal  cavity size was mildly dilated. Left ventricular diastolic parameters are   consistent with Grade I diastolic dysfunction (impaired relaxation).   2. Right ventricular systolic function is normal. The right ventricular  size is normal.   3. The mitral valve is normal in structure. No evidence of mitral valve  regurgitation. No evidence of mitral stenosis.   4. The aortic valve is normal in structure. Aortic valve regurgitation is  not visualized. No aortic stenosis is present.   5. The inferior vena cava is normal in size with greater than 50%  respiratory variability, suggesting right atrial pressure of 3 mmHg.   Comparison(s): Prior images reviewed side by side. The left ventricular  function has improved.   ABIs 11/2019 Normal   Echocardiogram 04/27/20 EF 40-45, Gr 1 DD, normal RVSF   Echocardiogram 12/25/19 EF 30-35, normal RVSF, trivial MR   Echocardiogram 04/02/2018 EF 35-40, moderate diffuse HK, grade 1 diastolic dysfunction, mildly calcified aortic valve leaflets, atrial septal lipomatous hypertrophy, trivial PI   ABIs 10/31/2016 Normal    Echocardiogram 04/10/2016 EF 35-40   Cardiac catheterization 10/28/2014 LAD mild diffuse disease RI irregularities LCx ostial 50, mid 25 RCA mild disease; RPDA 60 (small vessel) EF 35-45, LVEDP 18    EKG:  EKG is personally reviewed. 07/14/2021: Sinus rhythm. Rate 95 bpm.   Recent Labs: 02/06/2022: ALT 14; BUN 18; Creatinine, Ser 1.32; Potassium 4.3; Sodium 141 03/27/2022: Hemoglobin 10.7; Platelet Count 247   Recent Lipid Panel    Component Value Date/Time   CHOL 140 02/06/2022 1124   TRIG 123.0 02/06/2022 1124   HDL 47.90 02/06/2022 1124   CHOLHDL 3 02/06/2022 1124   VLDL 24.6 02/06/2022 1124   LDLCALC 68 02/06/2022 1124   LDLDIRECT 162.5 01/30/2013 1602     Risk Assessment/Calculations:           Physical Exam:    VS:  LMP 05/29/1991     Wt Readings from Last 3 Encounters:  03/27/22 229 lb 6.4 oz (104.1 kg)  02/26/22 228 lb (103.4 kg)  02/16/22 229 lb (103.9 kg)     GEN: Well  nourished, well developed in no acute distress HEENT: Normal NECK: No JVD; No carotid bruits CARDIAC: RRR, no murmurs, rubs, gallops RESPIRATORY:  Clear to auscultation without rales, wheezing or rhonchi  ABDOMEN: Soft, non-tender, non-distended MUSCULOSKELETAL:  No edema; No deformity  SKIN: Warm and dry NEUROLOGIC:  Alert and oriented x 3 PSYCHIATRIC:  Normal affect  ASSESSMENT:    No diagnosis found.  PLAN:    In order of problems listed above:  #Chronic Systolic Heart Failure #NICM: LVEF initially 30-35% which improved 40-45% in 2021. Currently doing well with NYHA class II symptoms. -Declined TTE today; may be willing to do next visit -Continue coreg 6.25mg  BID -Continue entresto 97-103mg  BID -Continue farxiga 10mg  daily -Continue spiro 25mg  daily  #HTN: Well controlled and at goal. -Continue coreg 6.25mg  BID -Continue entresto 97-103mg  BID -Continue spiro 25mg  daily  #DMII -Continue insulin -Continue trulicity  #HLD: -Continue lipitor 40mg  daily  #Mild-to-moderate coronary artery disease: Now with mild dyspnea on exertion. Denies chest pain. Offered coronary CTA today but she would like to wait. Will call back if symptoms worsening. -Continue ASA 81mg  daily -Continue lipitor 40mg  daily -Continue coreg 6.25mg  BID -Declined coronary CTA; may consider in the future if SOB worsening         Follow-up in 6 months.  Medication Adjustments/Labs and Tests Ordered: Current medicines are reviewed at length with the patient today.  Concerns regarding medicines are outlined above.   No orders of the defined types were placed in this encounter.  No orders of the defined types were placed in this encounter.  There are no Patient Instructions on file for this visit.    I,Mathew Stumpf,acting as a for , MD.,have documented all relevant documentation on the behalf of , MD,as directed by  , MD while in  the presence of , MD.  I, , MD, have reviewed all documentation for this visit. The documentation on 05/29/22 for the exam, diagnosis, procedures, and orders are all accurate and complete.   Signed, Meriam Sprague, MD  05/29/2022 8:28 PM    [facility]

## 2022-06-05 ENCOUNTER — Other Ambulatory Visit: Payer: Self-pay | Admitting: Internal Medicine

## 2022-06-05 ENCOUNTER — Ambulatory Visit: Payer: Medicare Other | Admitting: Cardiology

## 2022-06-08 ENCOUNTER — Other Ambulatory Visit: Payer: Self-pay | Admitting: Internal Medicine

## 2022-06-08 DIAGNOSIS — E118 Type 2 diabetes mellitus with unspecified complications: Secondary | ICD-10-CM

## 2022-06-22 ENCOUNTER — Other Ambulatory Visit: Payer: Self-pay | Admitting: Internal Medicine

## 2022-06-22 ENCOUNTER — Other Ambulatory Visit: Payer: Self-pay | Admitting: Family Medicine

## 2022-06-22 DIAGNOSIS — E118 Type 2 diabetes mellitus with unspecified complications: Secondary | ICD-10-CM

## 2022-07-04 ENCOUNTER — Ambulatory Visit (HOSPITAL_COMMUNITY): Payer: 59

## 2022-07-11 NOTE — Progress Notes (Unsigned)
[facility] [place] Phone: [phone] Subjective:   [name], am serving as a scribe for Dr. Hulan Saas.  I'm seeing this patient by the request  of:  Nicholas Lose, MD  CC: Knee pain and hip pain  [id number]  02/16/2022 Viscosupplementation given today, tolerated the procedure well.  Patient does have end-stage osteoarthritic changes but due to other comorbidities trying to avoid any type of surgical intervention.  Follow-up again in 6 weeks   Severe spinal stenosis as well as with patient having rheumatoid arthritis.  Patient does have some instability noted but likely of the fusion the patient has had.  Still wants to avoid surgical intervention.  Had unfortunately significant 3 days of pain after the last epidural.  We will continue to monitor.  Discussed icing regimen and home exercises.  No significant change in medications but I do feel we should consider the possibility of transitioning patient's Celexa to Cymbalta.  Follow-up again in 6 to 8 weeks.     Update 07/12/2022 Tesia I Glandon is a 67 y.o. female coming in with complaint of B knee, B hip and LBP. Hip injections April 2023. Nerve root injection August 2023. Patient states that she is unable to sleep due to hip pain that started to increase in December.   Both knees are also hurting. Visco injection last visit helped but about the same amount as the steroid.   Back pain has been manageable. Trying to avoid getting epidural even though she feels she may need one.        Past Medical History:  Diagnosis Date   Anemia    mild   Anxiety    Arthritis    Asthma    Cardiomyopathy ([place])    CHF (congestive heart failure) (HCC)    Diabetes mellitus without complication (HCC)    Frequency of urination    at night   GERD (gastroesophageal reflux disease)    potassium worsens reflux   Hemorrhoids, external    Hyperlipidemia    Hypertension     Neuromuscular disorder (HCC)    neuropathy   Numbness and tingling of both legs    Rheumatoid arthritis ([place])    Vertigo    hx of   Past Surgical History:  Procedure Laterality Date   ABDOMINAL HYSTERECTOMY     CARDIAC CATHETERIZATION N/A 10/28/2014   Procedure: Left Heart Cath and Coronary Angiography;  Surgeon: Jettie Booze, MD;  Location: [facility];  Service: Cardiovascular;  Laterality: N/A;   CARPAL TUNNEL RELEASE Left    COLONOSCOPY     pt states 11 yr ago in [place] had a colon with 2 polyps- one cecal polyp per pt. one ? location   COLONOSCOPY W/ POLYPECTOMY     CYST REMOVAL NECK     HEMORROIDECTOMY     MAXIMUM ACCESS (MAS)POSTERIOR LUMBAR INTERBODY FUSION (PLIF) 3 LEVEL N/A 10/08/2013   Procedure: FOR MAXIMUM ACCESS (MAS) POSTERIOR LUMBAR INTERBODY FUSION (PLIF) 3 LEVEL;  Surgeon: Erline Levine, MD;  Location: [facility];  Service: Neurosurgery;  Laterality: N/A;  L3-4 L4-5 L5-S1 maximum access posterior lumbar interbody fusion with decompression   TUBAL LIGATION     Social History   Socioeconomic History   Marital status: Single    Spouse name: Not on file   Number of children: Not on file   Years of education: Not on file   Highest education level: Not on file  Occupational History   Not on file  Tobacco Use   Smoking status: Former    Passive exposure: Current   Smokeless tobacco: Never  Vaping Use   Vaping Use: Never used  Substance and Sexual Activity   Alcohol use: Not Currently   Drug use: No    Types: Marijuana   Sexual activity: Not Currently  Other Topics Concern   Not on file  Social History Narrative   Not on file   Social Determinants of Health   Financial Resource Strain: Low Risk  (07/27/2021)   Overall Financial Resource Strain (CARDIA)    Difficulty of Paying Living Expenses: Not hard at all  Food Insecurity: No Food Insecurity (07/27/2021)   Hunger Vital Sign    Worried About Running Out of Food in the Last Year: Never true     Ran Out of Food in the Last Year: Never true  Transportation Needs: No Transportation Needs (07/27/2021)   PRAPARE - Hydrologist (Medical): No    Lack of Transportation (Non-Medical): No  Physical Activity: Inactive (07/27/2021)   Exercise Vital Sign    Days of Exercise per Week: 0 days    Minutes of Exercise per Session: 0 min  Stress: No Stress Concern Present (07/27/2021)   [place]    Feeling of Stress : Not at all  Social Connections: Moderately Integrated (07/19/2020)   Social Connection and Isolation Panel [NHANES]    Frequency of Communication with Friends and Family: More than three times a week    Frequency of Social Gatherings with Friends and Family: More than three times a week    Attends Religious Services: More than 4 times per year    Active Member of Clubs or Organizations: No    Attends Music therapist: More than 4 times per year    Marital Status: Widowed   Allergies  Allergen Reactions   Lisinopril Cough   Family History  Problem Relation Age of Onset   Early death Father    Heart disease Father    Hypertension Sister    Hypertension Brother    Diabetes Brother    Colon polyps Mother    Hashimoto's thyroiditis Sister    Colon cancer Maternal Grandmother        in her 26s   Stomach cancer Maternal Aunt    Non-Hodgkin's lymphoma Sister    Alcohol abuse Neg Hx    COPD Neg Hx    Depression Neg Hx    Drug abuse Neg Hx    Hearing loss Neg Hx    Hyperlipidemia Neg Hx    Kidney disease Neg Hx    Stroke Neg Hx    Esophageal cancer Neg Hx    Rectal cancer Neg Hx     Current Outpatient Medications (Endocrine & Metabolic):    Dulaglutide (TRULICITY) A999333 [id number] SOPN, INJECT 0.75 MG SUBCUTANEOULSY ONCE A WEEK   FARXIGA 10 MG TABS tablet, TAKE 1 TABLET BY MOUTH ONCE DAILY BEFORE BREAKFAST  Current Outpatient Medications (Cardiovascular):     atorvastatin (LIPITOR) 40 MG tablet, Take 1 tablet by mouth once daily   carvedilol (COREG) 12.5 MG tablet, Take 1 tablet (12.5 mg total) by mouth 2 (two) times daily.   sacubitril-valsartan (ENTRESTO) 97-103 MG, Take 1 tablet by mouth 2 (two) times daily.   spironolactone (ALDACTONE) 25 MG tablet, Take 1/2 (one-half) tablet by mouth once daily  Current Outpatient Medications (Respiratory):  Albuterol Sulfate (PROAIR RESPICLICK) [id number] (90 Base) MCG/ACT AEPB, INHALE 2 PUFFS BY MOUTH 4 TIMES DAILY AS NEEDED  Current Outpatient Medications (Analgesics):    allopurinol (ZYLOPRIM) 300 MG tablet, Take 1 tablet by mouth once daily   aspirin EC 81 MG tablet, Take 1 tablet (81 mg total) by mouth daily.   leflunomide (ARAVA) 20 MG tablet, Take 1 tablet by mouth once daily   Current Outpatient Medications (Other):    Accu-Chek FastClix Lancets MISC, USE 1  TO CHECK GLUCOSE THREE TIMES DAILY   Biotin 10 MG TABS, Take by mouth.   Blood Glucose Monitoring Suppl (ACCU-CHEK GUIDE) w/Device KIT, 1 Act by Does not apply route 3 (three) times daily.   Calcium Carb-Cholecalciferol (CALCIUM 600 + D PO), Take 1 tablet by mouth daily.   Cinnamon 500 MG TABS, Take by mouth.   citalopram (CELEXA) 20 MG tablet, Take 1 tablet by mouth once daily   DEXILANT 60 MG capsule, Take 1 capsule by mouth once daily   gabapentin (NEURONTIN) 600 MG tablet, TAKE 1 TABLET BY MOUTH THREE TIMES DAILY   glucose blood (ACCU-CHEK GUIDE) test strip, 1 each by Other route 3 (three) times daily.   Lancets Misc. (ACCU-CHEK FASTCLIX LANCET) KIT, Inject 1 Act into the skin 3 (three) times daily.   Omega-3 Fatty Acids (FISH OIL) 1200 MG CAPS, Take 1,200 mg by mouth daily.    Reviewed prior external information including notes and imaging from  primary care provider As well as notes that were available from care everywhere and other healthcare systems.  Past medical history, social, surgical and family history all reviewed in electronic  medical record.  No pertanent information unless stated regarding to the chief complaint.   Review of Systems:  No headache, visual changes, nausea, vomiting, diarrhea, constipation, dizziness, abdominal pain, skin rash, fevers, chills, night sweats, weight loss, swollen lymph nodes, chest pain, shortness of breath, mood changes. POSITIVE muscle aches, body aches, joint swelling  Objective  Blood pressure 122/80, pulse 80, height 5' 4"$  (1.626 m), weight 221 lb (100.2 kg), last menstrual period 05/29/1991, SpO2 96 %.   General: No apparent distress alert and oriented x3 mood and affect normal, dressed appropriately.  HEENT: Pupils equal, extraocular movements intact  Respiratory: Patient's speak in full sentences and does not appear short of breath  Cardiovascular: No lower extremity edema, non tender, no erythema  Back exam does have significant loss of lordosis.  Severe tenderness to palpation over the greater trochanteric area bilaterally.  Patient does have positive instability of the knees bilaterally.  Trace effusion noted.  Arthritic changes of multiple other joints.  Antalgic gait walking with the aid of a cane  After informed written and verbal consent, patient was seated on exam table. Right knee was prepped with alcohol swab and utilizing anterolateral approach, patient's right knee space was injected with 4:1  marcaine 0.5%: Kenalog 95m/dL. Patient tolerated the procedure well without immediate complications.  After informed written and verbal consent, patient was seated on exam table. Left knee was prepped with alcohol swab and utilizing anterolateral approach, patient's left knee space was injected with 4:1  marcaine 0.5%: Kenalog 470mdL. Patient tolerated the procedure well without immediate complications.   Procedure: Real-time Ultrasound Guided Injection of left  greater trochanteric bursitis secondary to patient's body habitus Device: GE Logiq Q7  Ultrasound guided injection  is preferred based studies that show increased duration, increased effect, greater accuracy, decreased procedural pain, increased response rate, and decreased cost with ultrasound  guided versus blind injection.  Verbal informed consent obtained.  Time-out conducted.  Noted no overlying erythema, induration, or other signs of local infection.  Skin prepped in a sterile fashion.  Local anesthesia: Topical Ethyl chloride.  With sterile technique and under real time ultrasound guidance:  Greater trochanteric area was visualized and patient's bursa was noted. A 22-gauge 3 inch needle was inserted and 4 cc of 0.5% Marcaine and 1 cc of Kenalog 40 mg/dL was injected. Pictures taken Completed without difficulty  Pain immediately resolved suggesting accurate placement of the medication.  Advised to call if fevers/chills, erythema, induration, drainage, or persistent bleeding.  Impression: Technically successful ultrasound guided injection.   Procedure: Real-time Ultrasound Guided Injection of right greater trochanteric bursitis secondary to patient's body habitus Device: GE Logiq Q7 Ultrasound guided injection is preferred based studies that show increased duration, increased effect, greater accuracy, decreased procedural pain, increased response rate, and decreased cost with ultrasound guided versus blind injection.  Verbal informed consent obtained.  Time-out conducted.  Noted no overlying erythema, induration, or other signs of local infection.  Skin prepped in a sterile fashion.  Local anesthesia: Topical Ethyl chloride.  With sterile technique and under real time ultrasound guidance:  Greater trochanteric area was visualized and patient's bursa was noted. A 22-gauge 3 inch needle was inserted and 4 cc of 0.5% Marcaine and 1 cc of Kenalog 40 mg/dL was injected. Pictures taken Completed without difficulty  Pain immediately resolved suggesting accurate placement of the medication.  Advised to call if  fevers/chills, erythema, induration, drainage, or persistent bleeding.  Impression: Technically successful ultrasound guided injection.    Impression and Recommendations:    The above documentation has been reviewed and is accurate and complete Lyndal Pulley, DO

## 2022-07-12 ENCOUNTER — Encounter: Payer: Self-pay | Admitting: Family Medicine

## 2022-07-12 ENCOUNTER — Ambulatory Visit: Payer: Self-pay

## 2022-07-12 ENCOUNTER — Ambulatory Visit (INDEPENDENT_AMBULATORY_CARE_PROVIDER_SITE_OTHER): Payer: 59 | Admitting: Family Medicine

## 2022-07-12 VITALS — BP 122/80 | HR 80 | Ht 64.0 in | Wt 221.0 lb

## 2022-07-12 DIAGNOSIS — M25551 Pain in right hip: Secondary | ICD-10-CM

## 2022-07-12 DIAGNOSIS — M25552 Pain in left hip: Secondary | ICD-10-CM

## 2022-07-12 DIAGNOSIS — M7062 Trochanteric bursitis, left hip: Secondary | ICD-10-CM

## 2022-07-12 DIAGNOSIS — M170 Bilateral primary osteoarthritis of knee: Secondary | ICD-10-CM | POA: Diagnosis not present

## 2022-07-12 DIAGNOSIS — M7061 Trochanteric bursitis, right hip: Secondary | ICD-10-CM | POA: Diagnosis not present

## 2022-07-12 NOTE — Patient Instructions (Addendum)
Injected knees and hips today Check sugar one extra time for next couple of days See me again in 3 months

## 2022-07-12 NOTE — Assessment & Plan Note (Signed)
Bilateral injections given today, tolerated the procedure well, discussed icing regimen and home exercises, encouraged weight loss.  Could be candidate for viscosupplementation if necessary.  Follow-up with me again in 3 months

## 2022-07-12 NOTE — Assessment & Plan Note (Signed)
Repeat injections given today, encourage patient to continue on her weight loss path.  Discussed how this could be significantly helpful.  Has had a laminectomy in the back previously.  Type 2 diabetes with a 6.6 A1c and encourage patient to monitor blood sugars after the injections.  Social determinants of health includes patient does have difficulty with transportation and physically is unable to be active secondary to her chronic comorbidities.  Follow-up with me again in 3 months

## 2022-07-24 ENCOUNTER — Other Ambulatory Visit: Payer: Self-pay

## 2022-07-24 MED ORDER — ENTRESTO 97-103 MG PO TABS: 1.0000 | ORAL_TABLET | Freq: Two times a day (BID) | ORAL | 2 refills | Status: DC

## 2022-07-31 ENCOUNTER — Telehealth: Payer: Self-pay

## 2022-07-31 ENCOUNTER — Ambulatory Visit (INDEPENDENT_AMBULATORY_CARE_PROVIDER_SITE_OTHER): Payer: 59

## 2022-07-31 ENCOUNTER — Telehealth: Payer: Self-pay | Admitting: Hematology and Oncology

## 2022-07-31 VITALS — Ht 64.0 in | Wt 229.0 lb

## 2022-07-31 DIAGNOSIS — Z0000 Encounter for general adult medical examination without abnormal findings: Secondary | ICD-10-CM

## 2022-07-31 NOTE — Telephone Encounter (Signed)
Patient needs her albuterol - insurance will not pay for the pro air respi click - she needs the old albuterol called in that she used to use.  Please send to [facility].

## 2022-07-31 NOTE — Telephone Encounter (Signed)
Patient seen for AWV and states that she is having problems with a yeast infection.  Has tried OTC meds with no relief.  Please advise.

## 2022-07-31 NOTE — Progress Notes (Signed)
Subjective:   Emily Wheeler is a 67 y.o. female who presents for Medicare Annual (Subsequent) preventive examination.  I connected with  Terika I Grunden on 07/31/22 by a audio enabled telemedicine application and verified that I am speaking with the correct person using two identifiers.  Patient Location: Home  Provider Location: Home Office  I discussed the limitations of evaluation and management by telemedicine. The patient expressed understanding and agreed to proceed.  Review of Systems     Cardiac Risk Factors include: advanced age (>32mn, >>86women);diabetes mellitus;dyslipidemia;hypertension     Objective:    Today's Vitals   07/31/22 1359  Weight: 229 lb (103.9 kg)  Height: '5\' 4"'$  (1.626 m)   Body mass index is 39.31 kg/m.     07/31/2022    2:29 PM 07/27/2021    9:44 AM 07/19/2020    3:52 PM 03/31/2016   11:11 AM 10/10/2015    7:17 AM 09/26/2015    2:13 PM 10/28/2014    6:13 AM  Advanced Directives  Does Patient Have a Medical Advance Directive? Yes No No Yes Yes Yes No  Type of Advance Directive Living will;Healthcare Power of [place] will [place] will [place]osa SanchezLiving will   Does patient want to make changes to medical advance directive?    No - Patient declined     Copy of [place] Chart? No - copy requested   Yes     Would patient like information on creating a medical advance directive?   No - Patient declined    No - patient declined information    Current Medications (verified) Outpatient Encounter Medications as of 07/31/2022  Medication Sig   Accu-Chek FastClix Lancets MISC USE 1  TO CHECK GLUCOSE THREE TIMES DAILY   Albuterol Sulfate (PROAIR RESPICLICK) 1123XX123(90 Base) MCG/ACT AEPB INHALE 2 PUFFS BY MOUTH 4 TIMES DAILY AS NEEDED   allopurinol (ZYLOPRIM) 300 MG tablet Take 1 tablet by mouth once daily   aspirin EC 81 MG tablet Take 1 tablet (81 mg  total) by mouth daily.   atorvastatin (LIPITOR) 40 MG tablet Take 1 tablet by mouth once daily   Biotin 10 MG TABS Take by mouth.   Blood Glucose Monitoring Suppl (ACCU-CHEK GUIDE) w/Device KIT 1 Act by Does not apply route 3 (three) times daily.   Calcium Carb-Cholecalciferol (CALCIUM 600 + D PO) Take 1 tablet by mouth daily.   carvedilol (COREG) 12.5 MG tablet Take 1 tablet (12.5 mg total) by mouth 2 (two) times daily.   Cinnamon 500 MG TABS Take by mouth.   citalopram (CELEXA) 20 MG tablet Take 1 tablet by mouth once daily   DEXILANT 60 MG capsule Take 1 capsule by mouth once daily   Dulaglutide (TRULICITY) [id number] INJECT 0.75 MG SUBCUTANEOULSY ONCE A WEEK   FARXIGA 10 MG TABS tablet TAKE 1 TABLET BY MOUTH ONCE DAILY BEFORE BREAKFAST   gabapentin (NEURONTIN) 600 MG tablet TAKE 1 TABLET BY MOUTH THREE TIMES DAILY   glucose blood (ACCU-CHEK GUIDE) test strip 1 each by Other route 3 (three) times daily.   Lancets Misc. (ACCU-CHEK FASTCLIX LANCET) KIT Inject 1 Act into the skin 3 (three) times daily.   leflunomide (ARAVA) 20 MG tablet Take 1 tablet by mouth once daily   Omega-3 Fatty Acids (FISH OIL) 1200 MG CAPS Take 1,200 mg by mouth daily.    sacubitril-valsartan (ENTRESTO) 97-103 MG Take 1  tablet by mouth 2 (two) times daily.   spironolactone (ALDACTONE) 25 MG tablet Take 1/2 (one-half) tablet by mouth once daily   No facility-administered encounter medications on file as of 07/31/2022.    Allergies (verified) Lisinopril   History: Past Medical History:  Diagnosis Date   Anemia    mild   Anxiety    Arthritis    Asthma    Cardiomyopathy ([place])    CHF (congestive heart failure) (HCC)    Diabetes mellitus without complication (HCC)    Frequency of urination    at night   GERD (gastroesophageal reflux disease)    potassium worsens reflux   Hemorrhoids, external    Hyperlipidemia    Hypertension    Neuromuscular disorder (HCC)    neuropathy   Numbness and tingling  of both legs    Rheumatoid arthritis ([place])    Vertigo    hx of   Past Surgical History:  Procedure Laterality Date   ABDOMINAL HYSTERECTOMY     CARDIAC CATHETERIZATION N/A 10/28/2014   Procedure: Left Heart Cath and Coronary Angiography;  Surgeon: Jettie Booze, MD;  Location: [place] [facility];  Service: Cardiovascular;  Laterality: N/A;   CARPAL TUNNEL RELEASE Left    COLONOSCOPY     pt states 11 yr ago in [place] had a colon with 2 polyps- one cecal polyp per pt. one ? location   COLONOSCOPY W/ POLYPECTOMY     CYST REMOVAL NECK     HEMORROIDECTOMY     MAXIMUM ACCESS (MAS)POSTERIOR LUMBAR INTERBODY FUSION (PLIF) 3 LEVEL N/A 10/08/2013   Procedure: FOR MAXIMUM ACCESS (MAS) POSTERIOR LUMBAR INTERBODY FUSION (PLIF) 3 LEVEL;  Surgeon: Erline Levine, MD;  Location: [place] [facility];  Service: Neurosurgery;  Laterality: N/A;  L3-4 L4-5 L5-S1 maximum access posterior lumbar interbody fusion with decompression   TUBAL LIGATION     Family History  Problem Relation Age of Onset   Early death Father    Heart disease Father    Hypertension Sister    Hypertension Brother    Diabetes Brother    Colon polyps Mother    Hashimoto's thyroiditis Sister    Colon cancer Maternal Grandmother        in her 31s   Stomach cancer Maternal Aunt    Non-Hodgkin's lymphoma Sister    Alcohol abuse Neg Hx    COPD Neg Hx    Depression Neg Hx    Drug abuse Neg Hx    Hearing loss Neg Hx    Hyperlipidemia Neg Hx    Kidney disease Neg Hx    Stroke Neg Hx    Esophageal cancer Neg Hx    Rectal cancer Neg Hx    Social History   Socioeconomic History   Marital status: Single    Spouse name: Not on file   Number of children: Not on file   Years of education: Not on file   Highest education level: Not on file  Occupational History   Not on file  Tobacco Use   Smoking status: Former    Passive exposure: Current   Smokeless tobacco: Never  Vaping Use   Vaping Use: Never used  Substance and Sexual Activity    Alcohol use: Not Currently   Drug use: No    Types: Marijuana   Sexual activity: Not Currently  Other Topics Concern   Not on file  Social History Narrative   Not on file   Social Determinants of Radio broadcast assistant  Strain: Low Risk  (07/31/2022)   Overall Financial Resource Strain (CARDIA)    Difficulty of Paying Living Expenses: Not hard at all  Food Insecurity: No Food Insecurity (07/31/2022)   Hunger Vital Sign    Worried About Running Out of Food in the Last Year: Never true    Ran Out of Food in the Last Year: Never true  Transportation Needs: No Transportation Needs (07/31/2022)   PRAPARE - Hydrologist (Medical): No    Lack of Transportation (Non-Medical): No  Physical Activity: Inactive (07/31/2022)   Exercise Vital Sign    Days of Exercise per Week: 0 days    Minutes of Exercise per Session: 0 min  Stress: No Stress Concern Present (07/31/2022)   [place]    Feeling of Stress : Not at all  Social Connections: Moderately Isolated (07/31/2022)   Social Connection and Isolation Panel [NHANES]    Frequency of Communication with Friends and Family: More than three times a week    Frequency of Social Gatherings with Friends and Family: More than three times a week    Attends Religious Services: More than 4 times per year    Active Member of [employer] or Organizations: No    Attends Archivist Meetings: Never    Marital Status: Widowed    Tobacco Counseling Counseling given: Not Answered   Clinical Intake:  Pre-visit preparation completed: Yes  Pain : No/denies pain  Diabetes: Yes CBG done?: No Did pt. bring in CBG monitor from home?: No  How often do you need to have someone help you when you read instructions, pamphlets, or other written materials from your doctor or pharmacy?: 1 - Never  Diabetic?Yes   Nutrition Risk Assessment:  Has the patient had any  N/V/D within the last 2 months?  No  Does the patient have any non-healing wounds?  No  Has the patient had any unintentional weight loss or weight gain?  No   Diabetes:  Is the patient diabetic?  Yes  If diabetic, was a CBG obtained today?  No  Did the patient bring in their glucometer from home?  No  How often do you monitor your CBG's? daily.   Financial Strains and Diabetes Management:  Are you having any financial strains with the device, your supplies or your medication? No .  Does the patient want to be seen by Chronic Care Management for management of their diabetes?  No  Would the patient like to be referred to a Nutritionist or for Diabetic Management?  No   Diabetic Exams:  Diabetic Eye Exam: Completed 09/18/21 Diabetic Foot Exam: Completed 02/06/22   Interpreter Needed?: No  Information entered by :: Denman George LPN   Activities of Daily Living    07/31/2022    2:29 PM  In your present state of health, do you have any difficulty performing the following activities:  Hearing? 0  Vision? 0  Difficulty concentrating or making decisions? 0  Walking or climbing stairs? 0  Dressing or bathing? 0  Doing errands, shopping? 0  Preparing Food and eating ? N  Using the Toilet? N  In the past six months, have you accidently leaked urine? N  Do you have problems with loss of bowel control? N  Managing your Medications? N  Managing your Finances? N  Housekeeping or managing your Housekeeping? N    Patient Care Team: [name], MD as PCP -  General (Internal Medicine) Freada Bergeron, MD as PCP - Cardiology (Cardiology) Pa, [facility] as Consulting Physician (Ophthalmology) Delice Bison, Darnelle Maffucci, [facility] (Inactive) (Pharmacist) Lyndal Pulley, DO as Consulting Physician (Family Medicine)  Indicate any recent Medical Services you may have received from other than [facility] providers in the past year (date may be approximate).     Assessment:    This is a routine wellness examination for [place].  Hearing/Vision screen Hearing Screening - Comments:: Denies hearing difficulties  Vision Screening - Comments:: Wears rx glasses - up to date with routine eye exams with [facility]    Dietary issues and exercise activities discussed: Current Exercise Habits: The patient does not participate in regular exercise at present   Goals Addressed             This Visit's Progress    COMPLETED: Manage My Medicine       Timeframe:  Long-Range Goal Priority:  Medium Start Date:           10/03/20                  Expected End Date:  09/08/2022                Follow Up Date 02/2022   - call for medicine refill 2 or 3 days before it runs out - call if I am sick and can't take my medicine - keep a list of all the medicines I take; vitamins and herbals too - use a pillbox to sort medicine    Why is this important?   These steps will help you keep on track with your medicines.   Notes:      COMPLETED: Patient Stated       07/27/2021, no goals      Depression Screen    07/31/2022    2:28 PM 07/27/2021    9:45 AM 07/19/2020    3:51 PM 04/18/2020    3:00 PM 02/03/2019    9:14 AM 09/19/2017    4:49 PM 03/31/2016   11:12 AM  PHQ 2/9 Scores  PHQ - 2 Score 0 0 0 0 0 0 0  PHQ- 9 Score     2 0     Fall Risk    07/31/2022    2:00 PM 07/27/2021    9:44 AM 07/19/2020    3:53 PM 04/18/2020    2:57 PM 02/03/2019    9:14 AM  Fall Risk   Falls in the past year? 0 0 0 0 0  Number falls in past yr: 0  0  0  Injury with Fall? 0  0  0  Risk for fall due to : No Fall Risks Medication side effect No Fall Risks    Follow up Falls prevention discussed;Education provided;Falls evaluation completed Falls evaluation completed;Education provided;Falls prevention discussed   Falls evaluation completed    FALL RISK PREVENTION PERTAINING TO THE HOME:  Any stairs in or around the home? No  If so, are there any without handrails? No  Home free of  loose throw rugs in walkways, pet beds, electrical cords, etc? Yes  Adequate lighting in your home to reduce risk of falls? Yes   ASSISTIVE DEVICES UTILIZED TO PREVENT FALLS:  Life alert? No  Use of a cane, walker or w/c? No  Grab bars in the bathroom? Yes  Shower chair or bench in shower? No  Elevated toilet seat or a handicapped toilet? Yes   TIMED UP AND  GO:  Was the test performed? No . Telephonic visit   Cognitive Function:        07/31/2022    2:29 PM 07/27/2021    9:48 AM  6CIT Screen  What Year? 0 points 0 points  What month? 0 points 0 points  What time? 0 points 0 points  Count back from 20 0 points 0 points  Months in reverse 0 points 0 points  Repeat phrase 0 points 0 points  Total Score 0 points 0 points    Immunizations Immunization History  Administered Date(s) Administered   Fluad Quad(high Dose 65+) 04/18/2020, 02/14/2021, 02/06/2022   Influenza, Quadrivalent, Recombinant, Inj, Pf 04/03/2018   Influenza,inj,Quad PF,6+ Mos 01/30/2013, 02/08/2014, 03/09/2015, 03/30/2016, 02/19/2017, 01/22/2019   PFIZER(Purple Top)SARS-COV-2 Vaccination 08/08/2019, 09/02/2019, 05/18/2020   Pneumococcal Polysaccharide-23 08/13/2017   Pneumococcal-Unspecified 05/29/2011   Tdap 01/10/2011    TDAP status: Due, Education has been provided regarding the importance of this vaccine. Advised may receive this vaccine at local pharmacy or Health Dept. Aware to provide a copy of the vaccination record if obtained from local pharmacy or Health Dept. Verbalized acceptance and understanding.  Flu Vaccine status: Up to date  Pneumococcal vaccine status: Due, Education has been provided regarding the importance of this vaccine. Advised may receive this vaccine at local pharmacy or Health Dept. Aware to provide a copy of the vaccination record if obtained from local pharmacy or Health Dept. Verbalized acceptance and understanding.  Covid-19 vaccine status: Information provided on how to  obtain vaccines.   Qualifies for Shingles Vaccine? Yes   Zostavax completed No   Shingrix Completed?: No.    Education has been provided regarding the importance of this vaccine. Patient has been advised to call insurance company to determine out of pocket expense if they have not yet received this vaccine. Advised may also receive vaccine at local pharmacy or Health Dept. Verbalized acceptance and understanding.  Screening Tests Health Maintenance  Topic Date Due   Zoster Vaccines- Shingrix (1 of 2) Never done   Pneumonia Vaccine 75+ Years old (2 of 2 - PCV) 03/01/2019   DTaP/Tdap/Td (2 - Td or Tdap) 01/09/2021   HEMOGLOBIN A1C  08/07/2022   MAMMOGRAM  09/09/2022   OPHTHALMOLOGY EXAM  09/19/2022   Diabetic kidney evaluation - eGFR measurement  02/07/2023   Diabetic kidney evaluation - Urine ACR  02/07/2023   FOOT EXAM  02/07/2023   Medicare Annual Wellness (AWV)  07/31/2023   COLONOSCOPY (Pts 45-87yr Insurance coverage will need to be confirmed)  10/10/2025   INFLUENZA VACCINE  Completed   DEXA SCAN  Completed   Hepatitis C Screening  Completed   HPV VACCINES  Aged Out   COVID-19 Vaccine  Discontinued    Health Maintenance  Health Maintenance Due  Topic Date Due   Zoster Vaccines- Shingrix (1 of 2) Never done   Pneumonia Vaccine 69 Years old (2 of 2 - PCV) 03/01/2019   DTaP/Tdap/Td (2 - Td or Tdap) 01/09/2021    Colorectal cancer screening: Type of screening: Colonoscopy. Completed 10/11/15. Repeat every 10 years  Mammogram status: Ordered today. Pt provided with contact info and advised to call to schedule appt.   Bone Density status: Completed 01/11/22. Results reflect: Bone density results: NORMAL. Repeat every 5 years.  Lung Cancer Screening: (Low Dose CT Chest recommended if Age 69-80years, 30 pack-year currently smoking OR have quit w/in 15years.) does not qualify.   Lung Cancer Screening Referral: n/a  Additional Screening:  Hepatitis C Screening:  does  qualify; Completed 01/11/22  Vision Screening: Recommended annual ophthalmology exams for early detection of glaucoma and other disorders of the eye. Is the patient up to date with their annual eye exam?  Yes  Who is the provider or what is the name of the office in which the patient attends annual eye exams? [facility] If pt is not established with a provider, would they like to be referred to a provider to establish care? No .   Dental Screening: Recommended annual dental exams for proper oral hygiene  Community Resource Referral / Chronic Care Management: CRR required this visit?  No   CCM required this visit?  No      Plan:     I have personally reviewed and noted the following in the patient's chart:   Medical and social history Use of alcohol, tobacco or illicit drugs  Current medications and supplements including opioid prescriptions. Patient is not currently taking opioid prescriptions. Functional ability and status Nutritional status Physical activity Advanced directives List of other physicians Hospitalizations, surgeries, and ER visits in previous 12 months Vitals Screenings to include cognitive, depression, and falls Referrals and appointments  In addition, I have reviewed and discussed with patient certain preventive protocols, quality metrics, and best practice recommendations. A written personalized care plan for preventive services as well as general preventive health recommendations were provided to patient.     Vanetta Mulders, [place]   [id number]   Due to this being a virtual visit, the after visit summary with patients personalized plan was offered to patient via mail or my-chart. Patient would like to access on my-chart  Nurse Notes: See telephone note with patient concern

## 2022-08-02 ENCOUNTER — Other Ambulatory Visit: Payer: Self-pay | Admitting: Internal Medicine

## 2022-08-02 MED ORDER — FLUCONAZOLE 150 MG PO TABS: 150.0000 mg | ORAL_TABLET | Freq: Once | ORAL | 2 refills | Status: AC

## 2022-08-20 ENCOUNTER — Other Ambulatory Visit: Payer: Self-pay | Admitting: *Deleted

## 2022-08-20 ENCOUNTER — Other Ambulatory Visit: Payer: Self-pay | Admitting: Internal Medicine

## 2022-08-20 DIAGNOSIS — M069 Rheumatoid arthritis, unspecified: Secondary | ICD-10-CM

## 2022-08-20 MED ORDER — SPIRONOLACTONE 25 MG PO TABS: ORAL_TABLET | ORAL | 0 refills | Status: DC

## 2022-08-20 NOTE — Telephone Encounter (Signed)
Not familiar with this med. Is this ok refill.Marland KitchenJohny Wheeler

## 2022-08-23 ENCOUNTER — Encounter: Payer: Self-pay | Admitting: Internal Medicine

## 2022-08-31 ENCOUNTER — Other Ambulatory Visit: Payer: Self-pay | Admitting: Internal Medicine

## 2022-09-03 ENCOUNTER — Encounter: Payer: Self-pay | Admitting: Internal Medicine

## 2022-09-03 ENCOUNTER — Other Ambulatory Visit: Payer: Self-pay | Admitting: Internal Medicine

## 2022-09-03 DIAGNOSIS — E118 Type 2 diabetes mellitus with unspecified complications: Secondary | ICD-10-CM

## 2022-09-03 MED ORDER — TRULICITY 0.75 MG/0.5ML ~~LOC~~ SOAJ: 0.7500 mg | SUBCUTANEOUS | 0 refills | Status: DC

## 2022-09-03 MED ORDER — ATORVASTATIN CALCIUM 40 MG PO TABS: 40.0000 mg | ORAL_TABLET | Freq: Every day | ORAL | 0 refills | Status: DC

## 2022-09-07 ENCOUNTER — Other Ambulatory Visit: Payer: Self-pay | Admitting: Internal Medicine

## 2022-09-07 DIAGNOSIS — J4540 Moderate persistent asthma, uncomplicated: Secondary | ICD-10-CM | POA: Insufficient documentation

## 2022-09-07 MED ORDER — AIRSUPRA 90-80 MCG/ACT IN AERO: 2.0000 | INHALATION_SPRAY | Freq: Four times a day (QID) | RESPIRATORY_TRACT | 1 refills | Status: DC | PRN

## 2022-09-17 ENCOUNTER — Other Ambulatory Visit (HOSPITAL_COMMUNITY): Payer: Self-pay

## 2022-09-20 ENCOUNTER — Other Ambulatory Visit: Payer: Self-pay | Admitting: Internal Medicine

## 2022-09-20 ENCOUNTER — Other Ambulatory Visit: Payer: Self-pay | Admitting: Family Medicine

## 2022-09-20 ENCOUNTER — Encounter: Payer: Self-pay | Admitting: Internal Medicine

## 2022-09-20 ENCOUNTER — Other Ambulatory Visit: Payer: Self-pay | Admitting: Cardiology

## 2022-09-20 DIAGNOSIS — K219 Gastro-esophageal reflux disease without esophagitis: Secondary | ICD-10-CM

## 2022-09-20 DIAGNOSIS — E118 Type 2 diabetes mellitus with unspecified complications: Secondary | ICD-10-CM

## 2022-09-20 MED ORDER — ALLOPURINOL 300 MG PO TABS: 300.0000 mg | ORAL_TABLET | Freq: Every day | ORAL | 0 refills | Status: DC

## 2022-09-20 MED ORDER — SPIRONOLACTONE 25 MG PO TABS: ORAL_TABLET | ORAL | 5 refills | Status: DC

## 2022-09-20 NOTE — Telephone Encounter (Signed)
MD denied all refills. She has to be seen for refills.Pls make appt....Raechel Chute

## 2022-09-20 NOTE — Telephone Encounter (Signed)
Patient called and said she is out of multiple medications. She has other doctors appointments between now and her scheduled appointment. She would like a call back if they can or cannot be filled. Best callback is [phone].

## 2022-09-21 ENCOUNTER — Other Ambulatory Visit: Payer: Self-pay | Admitting: Internal Medicine

## 2022-09-21 DIAGNOSIS — E118 Type 2 diabetes mellitus with unspecified complications: Secondary | ICD-10-CM

## 2022-09-24 ENCOUNTER — Other Ambulatory Visit: Payer: Self-pay | Admitting: Internal Medicine

## 2022-09-24 DIAGNOSIS — K219 Gastro-esophageal reflux disease without esophagitis: Secondary | ICD-10-CM

## 2022-09-29 ENCOUNTER — Other Ambulatory Visit: Payer: Self-pay | Admitting: Internal Medicine

## 2022-09-29 DIAGNOSIS — E118 Type 2 diabetes mellitus with unspecified complications: Secondary | ICD-10-CM

## 2022-10-02 ENCOUNTER — Ambulatory Visit (HOSPITAL_COMMUNITY): Admission: RE | Admit: 2022-10-02 | Payer: 59 | Source: Ambulatory Visit

## 2022-10-04 ENCOUNTER — Other Ambulatory Visit: Payer: Self-pay | Admitting: Internal Medicine

## 2022-10-09 ENCOUNTER — Telehealth: Payer: Self-pay

## 2022-10-09 NOTE — Telephone Encounter (Signed)
Patient Advocate Encounter  Received a fax from [employer] regarding Prior Authorization for Airsupra.   Authorization has been DENIED due to    Determination letter attached to patient chart

## 2022-10-15 ENCOUNTER — Telehealth: Payer: Self-pay

## 2022-10-15 MED ORDER — DEXLANSOPRAZOLE 60 MG PO CPDR: 60.0000 mg | DELAYED_RELEASE_CAPSULE | Freq: Every day | ORAL | 0 refills | Status: DC

## 2022-10-15 NOTE — Telephone Encounter (Signed)
Per CMM and [employer] they prefer the generic of Dexilant. Rx changed and sent to pharmacy.

## 2022-10-15 NOTE — Telephone Encounter (Signed)
Per pharmacy Dexilant requires PA.  PA started via [facility]

## 2022-10-16 ENCOUNTER — Ambulatory Visit (INDEPENDENT_AMBULATORY_CARE_PROVIDER_SITE_OTHER): Payer: 59 | Admitting: Internal Medicine

## 2022-10-16 ENCOUNTER — Encounter: Payer: Self-pay | Admitting: Internal Medicine

## 2022-10-16 VITALS — BP 118/82 | HR 79 | Temp 97.7°F | Resp 16 | Ht 64.0 in | Wt 227.0 lb

## 2022-10-16 DIAGNOSIS — I10 Essential (primary) hypertension: Secondary | ICD-10-CM

## 2022-10-16 DIAGNOSIS — D638 Anemia in other chronic diseases classified elsewhere: Secondary | ICD-10-CM

## 2022-10-16 DIAGNOSIS — N1831 Chronic kidney disease, stage 3a: Secondary | ICD-10-CM | POA: Diagnosis not present

## 2022-10-16 DIAGNOSIS — Z23 Encounter for immunization: Secondary | ICD-10-CM

## 2022-10-16 DIAGNOSIS — I739 Peripheral vascular disease, unspecified: Secondary | ICD-10-CM

## 2022-10-16 DIAGNOSIS — E118 Type 2 diabetes mellitus with unspecified complications: Secondary | ICD-10-CM | POA: Diagnosis not present

## 2022-10-16 DIAGNOSIS — Z7984 Long term (current) use of oral hypoglycemic drugs: Secondary | ICD-10-CM | POA: Diagnosis not present

## 2022-10-16 DIAGNOSIS — Z1231 Encounter for screening mammogram for malignant neoplasm of breast: Secondary | ICD-10-CM

## 2022-10-16 DIAGNOSIS — K2100 Gastro-esophageal reflux disease with esophagitis, without bleeding: Secondary | ICD-10-CM | POA: Diagnosis not present

## 2022-10-16 DIAGNOSIS — J4540 Moderate persistent asthma, uncomplicated: Secondary | ICD-10-CM | POA: Diagnosis not present

## 2022-10-16 DIAGNOSIS — D510 Vitamin B12 deficiency anemia due to intrinsic factor deficiency: Secondary | ICD-10-CM

## 2022-10-16 LAB — URINALYSIS, ROUTINE W REFLEX MICROSCOPIC
Bilirubin Urine: NEGATIVE
Hgb urine dipstick: NEGATIVE
Ketones, ur: NEGATIVE
Leukocytes,Ua: NEGATIVE
Nitrite: NEGATIVE
RBC / HPF: NONE SEEN (ref 0–?)
Specific Gravity, Urine: 1.015 (ref 1.000–1.030)
Total Protein, Urine: NEGATIVE
Urine Glucose: >=1000 — AB
Urobilinogen, UA: 0.2 (ref 0.0–1.0)
pH: 6.5 (ref 5.0–8.0)

## 2022-10-16 LAB — BASIC METABOLIC PANEL
BUN: 23 mg/dL (ref 6–23)
CO2: 26 mEq/L (ref 19–32)
Calcium: 9.3 mg/dL (ref 8.4–10.5)
Chloride: 105 mEq/L (ref 96–112)
Creatinine, Ser: 1.24 mg/dL — ABNORMAL HIGH (ref 0.40–1.20)
GFR: 44.68 mL/min — ABNORMAL LOW (ref >60.00)
Glucose, Bld: 104 mg/dL — ABNORMAL HIGH (ref 70–99)
Potassium: 4.1 mEq/L (ref 3.5–5.1)
Sodium: 140 mEq/L (ref 135–145)

## 2022-10-16 LAB — CBC WITH DIFFERENTIAL/PLATELET
Basophils Absolute: 0 10*3/uL (ref 0.0–0.1)
Basophils Relative: 0.6 % (ref 0.0–3.0)
Eosinophils Absolute: 0.2 10*3/uL (ref 0.0–0.7)
Eosinophils Relative: 3.9 % (ref 0.0–5.0)
HCT: 33.8 % — ABNORMAL LOW (ref 36.0–46.0)
Hemoglobin: 10.8 g/dL — ABNORMAL LOW (ref 12.0–15.0)
Lymphocytes Relative: 33.7 % (ref 12.0–46.0)
Lymphs Abs: 1.4 10*3/uL (ref 0.7–4.0)
MCHC: 31.9 g/dL (ref 30.0–36.0)
MCV: 101.6 fl — ABNORMAL HIGH (ref 78.0–100.0)
Monocytes Absolute: 0.5 10*3/uL (ref 0.1–1.0)
Monocytes Relative: 12.9 % — ABNORMAL HIGH (ref 3.0–12.0)
Neutro Abs: 2.1 10*3/uL (ref 1.4–7.7)
Neutrophils Relative %: 48.9 % (ref 43.0–77.0)
Platelets: 247 10*3/uL (ref 150.0–400.0)
RBC: 3.33 Mil/uL — ABNORMAL LOW (ref 3.87–5.11)
RDW: 15.7 % — ABNORMAL HIGH (ref 11.5–15.5)
WBC: 4.3 10*3/uL (ref 4.0–10.5)

## 2022-10-16 LAB — HEMOGLOBIN A1C: Hgb A1c MFr Bld: 5.9 % (ref 4.6–6.5)

## 2022-10-16 MED ORDER — BUDESONIDE-FORMOTEROL FUMARATE 160-4.5 MCG/ACT IN AERO
2.0000 | INHALATION_SPRAY | Freq: Two times a day (BID) | RESPIRATORY_TRACT | 3 refills | Status: DC
Start: 2022-10-16 — End: 2023-01-27

## 2022-10-16 MED ORDER — DAPAGLIFLOZIN PROPANEDIOL 10 MG PO TABS: 10.0000 mg | ORAL_TABLET | Freq: Every day | ORAL | 1 refills | Status: DC

## 2022-10-16 MED ORDER — ASPIRIN 81 MG PO TBEC
81.0000 mg | DELAYED_RELEASE_TABLET | Freq: Every day | ORAL | 1 refills | Status: AC
Start: 2022-10-16 — End: ?

## 2022-10-16 MED ORDER — ESOMEPRAZOLE MAGNESIUM 40 MG PO CPDR
40.0000 mg | DELAYED_RELEASE_CAPSULE | Freq: Every day | ORAL | 1 refills | Status: DC
Start: 2022-10-16 — End: 2023-04-15

## 2022-10-16 MED ORDER — LEVALBUTEROL TARTRATE 45 MCG/ACT IN AERO
2.0000 | INHALATION_SPRAY | Freq: Three times a day (TID) | RESPIRATORY_TRACT | 3 refills | Status: AC | PRN
Start: 2022-10-16 — End: ?

## 2022-10-16 NOTE — Progress Notes (Signed)
Subjective:  Patient ID: Emily Wheeler, female    DOB: Oct 15, 1955  Age: 67 y.o. MRN: [id number]  CC: Gastroesophageal Reflux, Asthma, Anemia, and Diabetes   HPI Emily Wheeler presents for f/up ---  She is active and denies chest pain, shortness of breath, diaphoresis, or edema.  She has rare nonproductive cough and wheezing.  Outpatient Medications Prior to Visit  Medication Sig Dispense Refill   Accu-Chek FastClix Lancets MISC USE 1  TO CHECK GLUCOSE THREE TIMES DAILY 306 each 0   allopurinol (ZYLOPRIM) 300 MG tablet Take 1 tablet (300 mg total) by mouth daily. 90 tablet 0   atorvastatin (LIPITOR) 40 MG tablet TAKE 1 TABLET BY MOUTH ONCE DAILY . APPOINTMENT REQUIRED FOR FUTURE REFILLS 30 tablet 0   Blood Glucose Monitoring Suppl (ACCU-CHEK GUIDE) w/Device KIT 1 Act by Does not apply route 3 (three) times daily. 2 kit 2   calcium carbonate (OS-CAL) 1250 (500 Ca) MG chewable tablet Chew 1 tablet by mouth daily.     carvedilol (COREG) 12.5 MG tablet Take 1 tablet (12.5 mg total) by mouth 2 (two) times daily. 180 tablet 3   citalopram (CELEXA) 20 MG tablet Take 1 tablet by mouth once daily 90 tablet 0   Dulaglutide (TRULICITY) 0.75 MG/0.5ML SOPN Inject 0.75 mg into the skin once a week. Must keep 10/16/22 appt for future refills 2 mL 0   gabapentin (NEURONTIN) 600 MG tablet TAKE 1 TABLET BY MOUTH THREE TIMES DAILY 270 tablet 0   glucose blood (ACCU-CHEK GUIDE) test strip 1 each by Other route 3 (three) times daily. 300 each 1   Lancets Misc. (ACCU-CHEK FASTCLIX LANCET) KIT Inject 1 Act into the skin 3 (three) times daily. 306 kit 1   leflunomide (ARAVA) 20 MG tablet Take 1 tablet by mouth once daily 90 tablet 0   Omega-3 Fatty Acids (FISH OIL) 1200 MG CAPS Take 1,200 mg by mouth daily.      sacubitril-valsartan (ENTRESTO) 97-103 MG Take 1 tablet by mouth 2 (two) times daily. 180 tablet 2   spironolactone (ALDACTONE) 25 MG tablet Take 1/2 (one-half) tablet by mouth once daily 15 tablet  5   Albuterol-Budesonide (AIRSUPRA) 90-80 MCG/ACT AERO Inhale 2 puffs into the lungs 4 (four) times daily as needed. 32.1 g 1   ascorbic acid (VITAMIN C) 500 MG tablet Take 500 mg by mouth daily.     aspirin EC 81 MG tablet Take 1 tablet (81 mg total) by mouth daily. 90 tablet 3   Biotin 10 MG TABS Take by mouth.     Calcium Carb-Cholecalciferol (CALCIUM 600 + D PO) Take 1 tablet by mouth daily.     Cinnamon 500 MG TABS Take by mouth.     dapagliflozin propanediol (FARXIGA) 10 MG TABS tablet TAKE 1 TABLET BY MOUTH ONCE DAILY BEFORE BREAKFAST 90 tablet 0   dexlansoprazole (DEXILANT) 60 MG capsule Take 1 capsule (60 mg total) by mouth daily. 30 capsule 0   folic acid (FOLVITE) 1 MG tablet Take 1 mg by mouth daily.     Albuterol Sulfate (PROAIR RESPICLICK) 108 (90 Base) MCG/ACT AEPB INHALE 2 PUFFS BY MOUTH 4 TIMES DAILY AS NEEDED (Patient not taking: Reported on 10/16/2022) 1 each 3   No facility-administered medications prior to visit.    ROS Review of Systems  Constitutional: Negative.  Negative for diaphoresis and fatigue.  HENT: Negative.    Eyes: Negative.  Negative for visual disturbance.  Respiratory:  Positive for cough and wheezing. Negative  for chest tightness and shortness of breath.   Cardiovascular:  Negative for chest pain, palpitations and leg swelling.  Gastrointestinal:  Negative for abdominal pain, constipation, diarrhea, nausea and vomiting.  Endocrine: Negative.   Genitourinary: Negative.  Negative for difficulty urinating.  Musculoskeletal:  Positive for arthralgias. Negative for myalgias.  Skin: Negative.   Neurological:  Negative for dizziness, weakness and headaches.  Hematological:  Negative for adenopathy. Does not bruise/bleed easily.  Psychiatric/Behavioral: Negative.      Objective:  BP 118/82 (BP Location: Left Arm, Patient Position: Sitting, Cuff Size: Normal)   Pulse 79   Temp 97.7 F (36.5 C) (Oral)   Resp 16   Ht 5\' 4"  (1.626 m)   Wt 227 lb (103  kg)   LMP 05/29/1991   SpO2 96%   BMI 38.96 kg/m   BP Readings from Last 3 Encounters:  10/16/22 118/82  07/12/22 122/80  03/27/22 (!) 159/74    Wt Readings from Last 3 Encounters:  10/16/22 227 lb (103 kg)  07/31/22 229 lb (103.9 kg)  07/12/22 221 lb (100.2 kg)    Physical Exam Vitals reviewed.  Constitutional:      Appearance: Normal appearance.  HENT:     Mouth/Throat:     Mouth: Mucous membranes are moist.  Eyes:     General: No scleral icterus.    Conjunctiva/sclera: Conjunctivae normal.  Cardiovascular:     Rate and Rhythm: Normal rate and regular rhythm.     Heart sounds: No murmur heard. Pulmonary:     Effort: Pulmonary effort is normal.     Breath sounds: No stridor. No wheezing, rhonchi or rales.  Abdominal:     General: Abdomen is flat.     Palpations: There is no mass.     Tenderness: There is no abdominal tenderness. There is no guarding.     Hernia: No hernia is present.  Musculoskeletal:        General: Normal range of motion.     Cervical back: Neck supple.     Right lower leg: No edema.     Left lower leg: No edema.  Lymphadenopathy:     Cervical: No cervical adenopathy.  Skin:    General: Skin is warm and dry.  Neurological:     General: No focal deficit present.     Mental Status: She is alert. Mental status is at baseline.  Psychiatric:        Mood and Affect: Mood normal.        Behavior: Behavior normal.     Lab Results  Component Value Date   WBC 4.3 10/16/2022   HGB 10.8 (L) 10/16/2022   HCT 33.8 (L) 10/16/2022   PLT 247.0 10/16/2022   GLUCOSE 104 (H) 10/16/2022   CHOL 140 02/06/2022   TRIG 123.0 02/06/2022   HDL 47.90 02/06/2022   LDLDIRECT 162.5 01/30/2013   LDLCALC 68 02/06/2022   ALT 14 02/06/2022   AST 15 02/06/2022   NA 140 10/16/2022   K 4.1 10/16/2022   CL 105 10/16/2022   CREATININE 1.24 (H) 10/16/2022   BUN 23 10/16/2022   CO2 26 10/16/2022   TSH 2.980 05/01/2021   INR 0.9 08/19/2018   HGBA1C 5.9  10/16/2022   MICROALBUR 1.7 02/06/2022    DEXAScan  Result Date: 01/11/2022 EXAM: DUAL X-RAY ABSORPTIOMETRY (DXA) FOR BONE MINERAL DENSITY IMPRESSION: Referring Physician:  [name] Your patient completed a bone mineral density test using GE Lunar iDXA system (analysis version: 16). Technologist: EDH  PATIENT: Name: Iola, Ritter Patient ID: [id number] Birth Date: 1956/05/08 Height: 50.5 in. Sex: Female Measured: 01/11/2022 Weight: 226.8 lbs. Indications: Arava, Celexa, Dexilant, Estrogen Deficient, Gabapentin, Hysterectomy, Insulin for Diabetes, Postmenopausal, Rheumatoid Arthritis (714.0) Fractures: NONE Treatments: Calcium (E943.0), Vitamin D (E933.5) ASSESSMENT: The BMD measured at Femur Neck Right is 0.933 g/cm2 with a T-score of -0.8. This patient is considered normal according to World Health Organization [facility]) criteria. The quality of the exam is good. The lumbar spine was excluded due to surgical hardware. Site Region Measured Date Measured Age YA BMD Significant CHANGE T-score DualFemur Neck Right 01/11/2020 67.8 -0.8 0.933 g/cm2 DualFemur Total Mean 01/11/2022 67.8 0.1 1.019 g/cm2 Right Forearm Radius 33% 01[age]11/2022 67.8 0.1 0.884 g/cm2 World Health Organization [place]) criteria for post-menopausal, Caucasian Women: Normal       T-score at or above -1 SD Osteopenia   T-score between -1 and -2.5 SD Osteoporosis T-score at or below -2.5 SD RECOMMENDATION: 1. All patients should optimize calcium and vitamin D intake. 2. Consider FDA-approved medical therapies in postmenopausal women and men aged 62 years and older, based on the following: a. A hip or vertebral (clinical or morphometric) fracture. b. T-score = -2.5 at the femoral neck or spine after appropriate evaluation to exclude secondary causes. c. Low bone mass (T-score between -1.0 and -2.5 at the femoral neck or spine) and a 10-year probability of a hip fracture = 3% or a 10-year probability of a major osteoporosis-related fracture = 20%  based on the [place]-adapted WHO algorithm. d. Clinician judgment and/or patient preferences may indicate treatment for people with 10-year fracture probabilities above or below these levels. FOLLOW-UP: Patients with diagnosis of osteoporosis or at high risk for fracture should have regular bone mineral density tests.? Patients eligible for Medicare are allowed routine testing every 2 years.? The testing frequency can be increased to one year for patients who have rapidly progressing disease, are receiving or discontinuing medical therapy to restore bone mass, or have additional risk factors. I have reviewed this study and agree with the findings. [facility] Electronically Signed   By: Bary Richard M.D.   On: 01/11/2022 10:15    Assessment & Plan:   Anemia of chronic disease- Her H&H are stable. -     CBC with Differential/Platelet; Future  Essential hypertension, benign- Her blood pressure is well-controlled. -     Basic metabolic panel; Future  Stage 3a chronic kidney disease (HCC)- Her renal function is stable. -     Basic metabolic panel; Future -     Urinalysis, Routine w reflex microscopic; Future -     Dapagliflozin Propanediol; Take 1 tablet (10 mg total) by mouth daily before breakfast.  Dispense: 90 tablet; Refill: 1  Type II diabetes mellitus with manifestations (HCC)- Her blood sugar is well-controlled. -     Basic metabolic panel; Future -     Hemoglobin A1c; Future -     Ambulatory referral to Ophthalmology -     Dapagliflozin Propanediol; Take 1 tablet (10 mg total) by mouth daily before breakfast.  Dispense: 90 tablet; Refill: 1  Vitamin B12 deficiency anemia due to intrinsic factor deficiency -     CBC with Differential/Platelet; Future  Moderate persistent asthma without complication -     Budesonide-Formoterol Fumarate; Inhale 2 puffs into the lungs 2 (two) times daily.  Dispense: 3 each; Refill: 3 -     Levalbuterol Tartrate; Inhale 2 puffs into the lungs  every 8 (eight) hours as needed for  wheezing.  Dispense: 3 each; Refill: 3  Gastroesophageal reflux disease with esophagitis without hemorrhage -     Esomeprazole Magnesium; Take 1 capsule (40 mg total) by mouth daily.  Dispense: 90 capsule; Refill: 1  Screening mammogram for breast cancer -     Digital Screening Mammogram, Left and Right; Future  Need for vaccination -     Pneumococcal polysaccharide vaccine 23-valent greater than or equal to 2yo subcutaneous/IM  PAD (peripheral artery disease) (HCC) -     Aspirin; Take 1 tablet (81 mg total) by mouth daily.  Dispense: 90 tablet; Refill: 1     Follow-up: Return in about 4 months (around 02/16/2023).  Sanda Linger, MD

## 2022-10-16 NOTE — Patient Instructions (Signed)

## 2022-10-28 ENCOUNTER — Other Ambulatory Visit: Payer: Self-pay | Admitting: Internal Medicine

## 2022-10-28 DIAGNOSIS — E118 Type 2 diabetes mellitus with unspecified complications: Secondary | ICD-10-CM

## 2022-10-29 ENCOUNTER — Other Ambulatory Visit: Payer: Self-pay | Admitting: Internal Medicine

## 2022-11-07 NOTE — Progress Notes (Signed)
Emily Wheeler [address] [place] [address] Phone: [phone] Subjective:   Emily Wheeler, am serving as a scribe for Dr. Antoine Primas.  I'm seeing this patient by the request  of:  Emily Grandchild, MD  CC: Bilateral hip pain  GMW:[id number]  07/12/2022 Bilateral injections given today, tolerated the procedure well, discussed icing regimen and home exercises, encouraged weight loss. Could be candidate for viscosupplementation if necessary. Follow-up with me again in 3 months   Repeat injections given today, encourage patient to continue on her weight loss path. Discussed how this could be significantly helpful. Has had a laminectomy in the back previously. Type 2 diabetes with a 6.6 A1c and encourage patient to monitor blood sugars after the injections. Social determinants of health includes patient does have difficulty with transportation and physically is unable to be active secondary to her chronic comorbidities. Follow-up with me again in 3 months   Updated 11/08/2022 Emily Wheeler is a 67 y.o. female coming in with complaint of B hip and knee pain. Same per usual. No new concerns.  Patient continues to have more hip pain and knee pain at the moment.  Affecting daily activities as well as waking her up at night.  Describes the pain as a dull, throbbing aching sensation with a sharp pain with certain motions.       Past Medical History:  Diagnosis Date   Anemia    mild   Anxiety    Arthritis    Asthma    Cardiomyopathy (HCC)    CHF (congestive heart failure) (HCC)    Diabetes mellitus without complication (HCC)    Frequency of urination    at night   GERD (gastroesophageal reflux disease)    potassium worsens reflux   Hemorrhoids, external    Hyperlipidemia    Hypertension    Neuromuscular disorder (HCC)    neuropathy   Numbness and tingling of both legs    Rheumatoid arthritis (HCC)    Vertigo    hx of   Past Surgical  History:  Procedure Laterality Date   ABDOMINAL HYSTERECTOMY     CARDIAC CATHETERIZATION N/A 10/28/2014   Procedure: Left Heart Cath and Coronary Angiography;  Surgeon: Corky Crafts, MD;  Location: [facility] INVASIVE CV LAB;  Service: Cardiovascular;  Laterality: N/A;   CARPAL TUNNEL RELEASE Left    COLONOSCOPY     pt states 11 yr ago in [place] had a colon with 2 polyps- one cecal polyp per pt. one ? location   COLONOSCOPY W/ POLYPECTOMY     CYST REMOVAL NECK     HEMORROIDECTOMY     MAXIMUM ACCESS (MAS)POSTERIOR LUMBAR INTERBODY FUSION (PLIF) 3 LEVEL N/A 10/08/2013   Procedure: FOR MAXIMUM ACCESS (MAS) POSTERIOR LUMBAR INTERBODY FUSION (PLIF) 3 LEVEL;  Surgeon: Maeola Harman, MD;  Location: [facility] NEURO ORS;  Service: Neurosurgery;  Laterality: N/A;  L3-4 L4-5 L5-S1 maximum access posterior lumbar interbody fusion with decompression   TUBAL LIGATION     Social History   Socioeconomic History   Marital status: Single    Spouse name: Not on file   Number of children: Not on file   Years of education: Not on file   Highest education level: Not on file  Occupational History   Not on file  Tobacco Use   Smoking status: Former    Passive exposure: Current   Smokeless tobacco: Never  Vaping Use   Vaping Use: Never used  Substance and  Sexual Activity   Alcohol use: Not Currently   Drug use: No    Types: Marijuana   Sexual activity: Not Currently  Other Topics Concern   Not on file  Social History Narrative   Not on file   Social Determinants of Health   Financial Resource Strain: Low Risk  (07/31/2022)   Overall Financial Resource Strain (CARDIA)    Difficulty of Paying Living Expenses: Not hard at all  Food Insecurity: No Food Insecurity (07/31/2022)   Hunger Vital Sign    Worried About Running Out of Food in the Last Year: Never true    Ran Out of Food in the Last Year: Never true  Transportation Needs: No Transportation Needs (07/31/2022)   PRAPARE - Scientist, research (physical sciences) (Medical): No    Lack of Transportation (Non-Medical): No  Physical Activity: Inactive (07/31/2022)   Exercise Vital Sign    Days of Exercise per Week: 0 days    Minutes of Exercise per Session: 0 min  Stress: No Stress Concern Present (07/31/2022)   [name] of Occupational Health - Occupational Stress Questionnaire    Feeling of Stress : Not at all  Social Connections: Moderately Isolated (07/31/2022)   Social Connection and Isolation Panel [NHANES]    Frequency of Communication with Friends and Family: More than three times a week    Frequency of Social Gatherings with Friends and Family: More than three times a week    Attends Religious Services: More than 4 times per year    Active Member of [employer] or Organizations: No    Attends Banker Meetings: Never    Marital Status: Widowed   Allergies  Allergen Reactions   Lisinopril Cough   Family History  Problem Relation Age of Onset   Early death Father    Heart disease Father    Hypertension Sister    Hypertension Brother    Diabetes Brother    Colon polyps Mother    Hashimoto's thyroiditis Sister    Colon cancer Maternal Grandmother        in her 38s   Stomach cancer Maternal Aunt    Non-Hodgkin's lymphoma Sister    Alcohol abuse Neg Hx    COPD Neg Hx    Depression Neg Hx    Drug abuse Neg Hx    Hearing loss Neg Hx    Hyperlipidemia Neg Hx    Kidney disease Neg Hx    Stroke Neg Hx    Esophageal cancer Neg Hx    Rectal cancer Neg Hx     Current Outpatient Medications (Endocrine & Metabolic):    dapagliflozin propanediol (FARXIGA) 10 MG TABS tablet, Take 1 tablet (10 mg total) by mouth daily before breakfast.   TRULICITY 0.75 MG/0.5ML SOPN, INJECT 0.75MG  SUBCUTANEOUSLY ONCE A WEEK  Current Outpatient Medications (Cardiovascular):    atorvastatin (LIPITOR) 40 MG tablet, Take 1 tablet (40 mg total) by mouth daily.   carvedilol (COREG) 12.5 MG tablet, Take 1 tablet (12.5 mg total) by  mouth 2 (two) times daily.   sacubitril-valsartan (ENTRESTO) 97-103 MG, Take 1 tablet by mouth 2 (two) times daily.   spironolactone (ALDACTONE) 25 MG tablet, Take 1/2 (one-half) tablet by mouth once daily  Current Outpatient Medications (Respiratory):    budesonide-formoterol (SYMBICORT) 160-4.5 MCG/ACT inhaler, Inhale 2 puffs into the lungs 2 (two) times daily.   levalbuterol (XOPENEX HFA) 45 MCG/ACT inhaler, Inhale 2 puffs into the lungs every 8 (eight) hours as needed for wheezing.  Current Outpatient Medications (Analgesics):    allopurinol (ZYLOPRIM) 300 MG tablet, Take 1 tablet (300 mg total) by mouth daily.   aspirin EC 81 MG tablet, Take 1 tablet (81 mg total) by mouth daily.   leflunomide (ARAVA) 20 MG tablet, Take 1 tablet by mouth once daily   Current Outpatient Medications (Other):    Accu-Chek FastClix Lancets MISC, USE 1  TO CHECK GLUCOSE THREE TIMES DAILY   Blood Glucose Monitoring Suppl (ACCU-CHEK GUIDE) w/Device KIT, 1 Act by Does not apply route 3 (three) times daily.   calcium carbonate (OS-CAL) 1250 (500 Ca) MG chewable tablet, Chew 1 tablet by mouth daily.   citalopram (CELEXA) 20 MG tablet, Take 1 tablet by mouth once daily   esomeprazole (NEXIUM) 40 MG capsule, Take 1 capsule (40 mg total) by mouth daily.   gabapentin (NEURONTIN) 600 MG tablet, TAKE 1 TABLET BY MOUTH THREE TIMES DAILY   glucose blood (ACCU-CHEK GUIDE) test strip, 1 each by Other route 3 (three) times daily.   Lancets Misc. (ACCU-CHEK FASTCLIX LANCET) KIT, Inject 1 Act into the skin 3 (three) times daily.   Omega-3 Fatty Acids (FISH OIL) 1200 MG CAPS, Take 1,200 mg by mouth daily.    Reviewed prior external information including notes and imaging from  primary care provider As well as notes that were available from care everywhere and other healthcare systems.  Past medical history, social, surgical and family history all reviewed in electronic medical record.  No pertanent information unless  stated regarding to the chief complaint.   Review of Systems:  No headache, visual changes, nausea, vomiting, diarrhea, constipation, dizziness, abdominal pain, skin rash, fevers, chills, night sweats, weight loss, swollen lymph nodes, body aches, joint swelling, chest pain, shortness of breath, mood changes. POSITIVE muscle aches  Objective  Blood pressure 120/74, pulse 81, height 5\' 4"  (1.626 m), weight 230 lb (104.3 kg), last menstrual period 05/29/1991, SpO2 95 %.   General: No apparent distress alert and oriented x3 mood and affect normal, dressed appropriately.  HEENT: Pupils equal, extraocular movements intact  Respiratory: Patient's speak in full sentences and does not appear short of breath  Cardiovascular: No lower extremity edema, non tender, no erythema  Antalgic gait walking with the aid of a crutch.  Patient does have severe tenderness to palpation of the greater trochanteric area bilaterally and seems to be right greater than left.  Difficulty with FABER test bilaterally.  Negative straight leg test.  Only 5 degrees of extension of the back.  Knees do have arthritic changes noted as well   After verbal consent patient was prepped with alcohol swab and with a 21-gauge 2 inch needle injected into the right greater trochanteric area with 2 cc of 0.5% Marcaine and 1 cc of Kenalog 40 mg/mL.  No blood loss.  Band-Aid placed.  Postinjection instructions given   After verbal consent patient was prepped with alcohol swab and with a 21-gauge 2 inch needle injected into the left greater trochanteric area with 2 cc of 0.5% Marcaine and 1 cc of Kenalog 40 mg/mL.  No blood loss.  Band-Aid placed.  Postinjection instructions given    Impression and Recommendations:    The above documentation has been reviewed and is accurate and complete Judi Saa, DO

## 2022-11-08 ENCOUNTER — Encounter: Payer: Self-pay | Admitting: Family Medicine

## 2022-11-08 ENCOUNTER — Ambulatory Visit (INDEPENDENT_AMBULATORY_CARE_PROVIDER_SITE_OTHER): Payer: 59 | Admitting: Family Medicine

## 2022-11-08 VITALS — BP 120/74 | HR 81 | Ht 64.0 in | Wt 230.0 lb

## 2022-11-08 DIAGNOSIS — M7061 Trochanteric bursitis, right hip: Secondary | ICD-10-CM

## 2022-11-08 DIAGNOSIS — M7062 Trochanteric bursitis, left hip: Secondary | ICD-10-CM | POA: Diagnosis not present

## 2022-11-08 NOTE — Patient Instructions (Signed)
Good to see you mom See me again in 10-12 weeks

## 2022-11-08 NOTE — Assessment & Plan Note (Signed)
  With worsening symptoms.  We have secondary to the spinal stenosis.  This causes of worsening pain and recurrent bursitis.  We discussed which activities to do and which ones to avoid.  Will continue to work on weight loss.  Follow-up with me again in 3 months.  Held on any injection on the knees.

## 2022-11-15 ENCOUNTER — Ambulatory Visit
Admission: RE | Admit: 2022-11-15 | Discharge: 2022-11-15 | Disposition: A | Discharge status: Home / Self Care | Payer: 59 | Source: Ambulatory Visit | Attending: Internal Medicine | Admitting: Internal Medicine

## 2022-11-15 DIAGNOSIS — Z1231 Encounter for screening mammogram for malignant neoplasm of breast: Secondary | ICD-10-CM | POA: Diagnosis not present

## 2022-11-18 ENCOUNTER — Other Ambulatory Visit: Payer: Self-pay | Admitting: Internal Medicine

## 2022-11-18 DIAGNOSIS — M069 Rheumatoid arthritis, unspecified: Secondary | ICD-10-CM

## 2022-11-23 DIAGNOSIS — H5203 Hypermetropia, bilateral: Secondary | ICD-10-CM | POA: Diagnosis not present

## 2022-11-23 DIAGNOSIS — E119 Type 2 diabetes mellitus without complications: Secondary | ICD-10-CM | POA: Diagnosis not present

## 2022-11-23 DIAGNOSIS — H31002 Unspecified chorioretinal scars, left eye: Secondary | ICD-10-CM | POA: Diagnosis not present

## 2022-11-23 LAB — HM DIABETES EYE EXAM

## 2022-12-09 ENCOUNTER — Encounter: Payer: Self-pay | Admitting: Internal Medicine

## 2022-12-10 ENCOUNTER — Encounter (HOSPITAL_COMMUNITY): Payer: Self-pay

## 2022-12-10 ENCOUNTER — Other Ambulatory Visit: Payer: Self-pay | Admitting: Internal Medicine

## 2022-12-10 DIAGNOSIS — I255 Ischemic cardiomyopathy: Secondary | ICD-10-CM

## 2022-12-10 DIAGNOSIS — I10 Essential (primary) hypertension: Secondary | ICD-10-CM

## 2022-12-10 MED ORDER — CARVEDILOL 12.5 MG PO TABS: 12.5000 mg | ORAL_TABLET | Freq: Two times a day (BID) | ORAL | 0 refills | Status: DC

## 2022-12-12 ENCOUNTER — Other Ambulatory Visit: Payer: Self-pay | Admitting: Physician Assistant

## 2022-12-12 ENCOUNTER — Ambulatory Visit (HOSPITAL_COMMUNITY)
Admission: RE | Admit: 2022-12-12 | Discharge: 2022-12-12 | Disposition: A | Discharge status: Home / Self Care | Payer: 59 | Source: Ambulatory Visit | Attending: Physician Assistant | Admitting: Physician Assistant

## 2022-12-12 DIAGNOSIS — I428 Other cardiomyopathies: Secondary | ICD-10-CM

## 2022-12-12 DIAGNOSIS — I5022 Chronic systolic (congestive) heart failure: Secondary | ICD-10-CM | POA: Diagnosis not present

## 2022-12-12 MED ORDER — GADOBUTROL 1 MMOL/ML IV SOLN
10.0000 mL | Freq: Once | INTRAVENOUS | Status: AC | PRN
  Administered 2022-12-12: 10 mL via INTRAVENOUS

## 2022-12-18 ENCOUNTER — Other Ambulatory Visit: Payer: Self-pay | Admitting: Family Medicine

## 2022-12-30 ENCOUNTER — Encounter: Payer: Self-pay | Admitting: Internal Medicine

## 2023-01-02 ENCOUNTER — Encounter: Payer: Self-pay | Admitting: Internal Medicine

## 2023-01-04 ENCOUNTER — Other Ambulatory Visit: Payer: Self-pay | Admitting: Internal Medicine

## 2023-01-22 ENCOUNTER — Telehealth: Payer: Self-pay | Admitting: Physician Assistant

## 2023-01-22 NOTE — Telephone Encounter (Signed)
Pt calling because she thinks she might "have fluid". Says she went to the bathroom al day on Sunday. Says weight was 228, now 224. No edema/swelling (except in her ankles near end of the day sometimes). Pt informed that she is not retaining fluid if she is losing weight, so this is good  news. Advised that she is overdue for follow up and that I will forward this note to schedulers to call and arrange overdue follow up.  Pt aware office will call to arrange. Pt agreeable to plan.

## 2023-01-22 NOTE — Telephone Encounter (Signed)
  Per MyChart scheduling message:   Patient c/o Palpitations:  STAT if patient reporting lightheadedness, shortness of breath, or chest pain  How long have you had palpitations/irregular HR/ Afib? Are you having the symptoms now?   Are you currently experiencing lightheadedness, SOB or CP?   Do you have a history of afib (atrial fibrillation) or irregular heart rhythm?   Have you checked your BP or HR? (document readings if available):   Are you experiencing any other symptoms?     About two weeks with the heart palpitations. No chest pains. I don't walk good and when I walk to my car from my apartment I get shortness of breath maybe partially due to my back. I think I have fluid because I went to the bathroom all day and all night every fourty minutes back to back. No other symptoms

## 2023-01-26 ENCOUNTER — Encounter (HOSPITAL_COMMUNITY): Payer: Self-pay | Admitting: *Deleted

## 2023-01-26 ENCOUNTER — Observation Stay (HOSPITAL_COMMUNITY)
Admission: EM | Admit: 2023-01-26 | Discharge: 2023-01-29 | Disposition: A | Discharge status: Home / Self Care | Payer: 59 | Attending: Family Medicine | Admitting: Family Medicine

## 2023-01-26 ENCOUNTER — Other Ambulatory Visit: Payer: Self-pay

## 2023-01-26 ENCOUNTER — Emergency Department (HOSPITAL_COMMUNITY): Payer: 59

## 2023-01-26 DIAGNOSIS — J45909 Unspecified asthma, uncomplicated: Secondary | ICD-10-CM | POA: Diagnosis not present

## 2023-01-26 DIAGNOSIS — N1832 Chronic kidney disease, stage 3b: Secondary | ICD-10-CM | POA: Diagnosis present

## 2023-01-26 DIAGNOSIS — I5020 Unspecified systolic (congestive) heart failure: Secondary | ICD-10-CM | POA: Diagnosis present

## 2023-01-26 DIAGNOSIS — R000 Tachycardia, unspecified: Secondary | ICD-10-CM | POA: Diagnosis not present

## 2023-01-26 DIAGNOSIS — M069 Rheumatoid arthritis, unspecified: Secondary | ICD-10-CM | POA: Diagnosis present

## 2023-01-26 DIAGNOSIS — M109 Gout, unspecified: Secondary | ICD-10-CM | POA: Diagnosis present

## 2023-01-26 DIAGNOSIS — R0603 Acute respiratory distress: Principal | ICD-10-CM | POA: Diagnosis present

## 2023-01-26 DIAGNOSIS — F419 Anxiety disorder, unspecified: Secondary | ICD-10-CM | POA: Diagnosis present

## 2023-01-26 DIAGNOSIS — N1831 Chronic kidney disease, stage 3a: Secondary | ICD-10-CM | POA: Diagnosis not present

## 2023-01-26 DIAGNOSIS — E785 Hyperlipidemia, unspecified: Secondary | ICD-10-CM | POA: Diagnosis present

## 2023-01-26 DIAGNOSIS — M7989 Other specified soft tissue disorders: Secondary | ICD-10-CM | POA: Diagnosis not present

## 2023-01-26 DIAGNOSIS — E1122 Type 2 diabetes mellitus with diabetic chronic kidney disease: Secondary | ICD-10-CM | POA: Insufficient documentation

## 2023-01-26 DIAGNOSIS — I130 Hypertensive heart and chronic kidney disease with heart failure and stage 1 through stage 4 chronic kidney disease, or unspecified chronic kidney disease: Secondary | ICD-10-CM | POA: Insufficient documentation

## 2023-01-26 DIAGNOSIS — R0602 Shortness of breath: Secondary | ICD-10-CM | POA: Diagnosis not present

## 2023-01-26 DIAGNOSIS — Z7982 Long term (current) use of aspirin: Secondary | ICD-10-CM | POA: Insufficient documentation

## 2023-01-26 DIAGNOSIS — R202 Paresthesia of skin: Secondary | ICD-10-CM | POA: Diagnosis not present

## 2023-01-26 DIAGNOSIS — I10 Essential (primary) hypertension: Secondary | ICD-10-CM | POA: Diagnosis present

## 2023-01-26 DIAGNOSIS — I517 Cardiomegaly: Secondary | ICD-10-CM | POA: Diagnosis not present

## 2023-01-26 DIAGNOSIS — R002 Palpitations: Secondary | ICD-10-CM | POA: Diagnosis present

## 2023-01-26 DIAGNOSIS — K219 Gastro-esophageal reflux disease without esophagitis: Secondary | ICD-10-CM | POA: Diagnosis present

## 2023-01-26 DIAGNOSIS — Z79899 Other long term (current) drug therapy: Secondary | ICD-10-CM | POA: Insufficient documentation

## 2023-01-26 DIAGNOSIS — D539 Nutritional anemia, unspecified: Secondary | ICD-10-CM | POA: Diagnosis present

## 2023-01-26 DIAGNOSIS — Z87891 Personal history of nicotine dependence: Secondary | ICD-10-CM | POA: Diagnosis not present

## 2023-01-26 DIAGNOSIS — R42 Dizziness and giddiness: Secondary | ICD-10-CM

## 2023-01-26 DIAGNOSIS — E118 Type 2 diabetes mellitus with unspecified complications: Secondary | ICD-10-CM | POA: Diagnosis present

## 2023-01-26 LAB — BASIC METABOLIC PANEL
Anion gap: 11 (ref 5–15)
BUN: 20 mg/dL (ref 8–23)
CO2: 25 mmol/L (ref 22–32)
Calcium: 9 mg/dL (ref 8.9–10.3)
Chloride: 105 mmol/L (ref 98–111)
Creatinine, Ser: 1.37 mg/dL — ABNORMAL HIGH (ref 0.44–1.00)
GFR, Estimated: 42 mL/min — ABNORMAL LOW (ref >60)
Glucose, Bld: 107 mg/dL — ABNORMAL HIGH (ref 70–99)
Potassium: 3.9 mmol/L (ref 3.5–5.1)
Sodium: 141 mmol/L (ref 135–145)

## 2023-01-26 LAB — CBC
HCT: 33.6 % — ABNORMAL LOW (ref 36.0–46.0)
Hemoglobin: 10.7 g/dL — ABNORMAL LOW (ref 12.0–15.0)
MCH: 31.9 pg (ref 26.0–34.0)
MCHC: 31.8 g/dL (ref 30.0–36.0)
MCV: 100.3 fL — ABNORMAL HIGH (ref 80.0–100.0)
Platelets: 250 10*3/uL (ref 150–400)
RBC: 3.35 MIL/uL — ABNORMAL LOW (ref 3.87–5.11)
RDW: 16.1 % — ABNORMAL HIGH (ref 11.5–15.5)
WBC: 5.5 10*3/uL (ref 4.0–10.5)
nRBC: 0.4 % — ABNORMAL HIGH (ref 0.0–0.2)

## 2023-01-26 LAB — TROPONIN I (HIGH SENSITIVITY): Troponin I (High Sensitivity): 10 ng/L (ref <18)

## 2023-01-26 NOTE — ED Triage Notes (Signed)
The pt is c/o having sob for 2 weeks and she had a little tingling in her heart this am none now some swelling in her feet and ankles

## 2023-01-27 ENCOUNTER — Emergency Department (HOSPITAL_COMMUNITY): Payer: 59

## 2023-01-27 DIAGNOSIS — K219 Gastro-esophageal reflux disease without esophagitis: Secondary | ICD-10-CM

## 2023-01-27 DIAGNOSIS — M069 Rheumatoid arthritis, unspecified: Secondary | ICD-10-CM

## 2023-01-27 DIAGNOSIS — E785 Hyperlipidemia, unspecified: Secondary | ICD-10-CM

## 2023-01-27 DIAGNOSIS — R002 Palpitations: Secondary | ICD-10-CM | POA: Diagnosis present

## 2023-01-27 DIAGNOSIS — R0603 Acute respiratory distress: Secondary | ICD-10-CM | POA: Diagnosis not present

## 2023-01-27 DIAGNOSIS — F419 Anxiety disorder, unspecified: Secondary | ICD-10-CM | POA: Diagnosis present

## 2023-01-27 DIAGNOSIS — R42 Dizziness and giddiness: Secondary | ICD-10-CM

## 2023-01-27 DIAGNOSIS — I517 Cardiomegaly: Secondary | ICD-10-CM | POA: Diagnosis not present

## 2023-01-27 DIAGNOSIS — R202 Paresthesia of skin: Secondary | ICD-10-CM | POA: Diagnosis not present

## 2023-01-27 DIAGNOSIS — E118 Type 2 diabetes mellitus with unspecified complications: Secondary | ICD-10-CM | POA: Diagnosis not present

## 2023-01-27 DIAGNOSIS — D539 Nutritional anemia, unspecified: Secondary | ICD-10-CM | POA: Diagnosis not present

## 2023-01-27 DIAGNOSIS — N1831 Chronic kidney disease, stage 3a: Secondary | ICD-10-CM | POA: Diagnosis not present

## 2023-01-27 DIAGNOSIS — M109 Gout, unspecified: Secondary | ICD-10-CM | POA: Diagnosis present

## 2023-01-27 DIAGNOSIS — M7989 Other specified soft tissue disorders: Secondary | ICD-10-CM | POA: Diagnosis not present

## 2023-01-27 DIAGNOSIS — I5020 Unspecified systolic (congestive) heart failure: Secondary | ICD-10-CM | POA: Diagnosis present

## 2023-01-27 DIAGNOSIS — I10 Essential (primary) hypertension: Secondary | ICD-10-CM | POA: Diagnosis not present

## 2023-01-27 LAB — HEPATIC FUNCTION PANEL
ALT: 22 U/L (ref 0–44)
AST: 22 U/L (ref 15–41)
Albumin: 3.9 g/dL (ref 3.5–5.0)
Alkaline Phosphatase: 74 U/L (ref 38–126)
Bilirubin, Direct: 0.2 mg/dL (ref 0.0–0.2)
Indirect Bilirubin: 0.4 mg/dL (ref 0.3–0.9)
Total Bilirubin: 0.6 mg/dL (ref 0.3–1.2)
Total Protein: 6.6 g/dL (ref 6.5–8.1)

## 2023-01-27 LAB — TSH: TSH: 2.984 u[IU]/mL (ref 0.350–4.500)

## 2023-01-27 LAB — D-DIMER, QUANTITATIVE: D-Dimer, Quant: 1.95 ug{FEU}/mL — ABNORMAL HIGH (ref 0.00–0.50)

## 2023-01-27 LAB — TROPONIN I (HIGH SENSITIVITY): Troponin I (High Sensitivity): 12 ng/L (ref <18)

## 2023-01-27 LAB — GLUCOSE, CAPILLARY: Glucose-Capillary: 105 mg/dL — ABNORMAL HIGH (ref 70–99)

## 2023-01-27 LAB — HIV ANTIBODY (ROUTINE TESTING W REFLEX): HIV Screen 4th Generation wRfx: NONREACTIVE

## 2023-01-27 LAB — BRAIN NATRIURETIC PEPTIDE: B Natriuretic Peptide: 30.7 pg/mL (ref 0.0–100.0)

## 2023-01-27 MED ORDER — FUROSEMIDE 10 MG/ML IJ SOLN
20.0000 mg | Freq: Two times a day (BID) | INTRAMUSCULAR | Status: DC
  Administered 2023-01-27 – 2023-01-28 (×2): 20 mg via INTRAVENOUS
  Filled 2023-01-27 (×2): qty 2

## 2023-01-27 MED ORDER — CARVEDILOL 12.5 MG PO TABS
12.5000 mg | ORAL_TABLET | Freq: Two times a day (BID) | ORAL | Status: DC
  Administered 2023-01-27 – 2023-01-29 (×5): 12.5 mg via ORAL
  Filled 2023-01-27 (×5): qty 1

## 2023-01-27 MED ORDER — FUROSEMIDE 10 MG/ML IJ SOLN
20.0000 mg | Freq: Once | INTRAMUSCULAR | Status: AC
  Administered 2023-01-27: 20 mg via INTRAVENOUS
  Filled 2023-01-27: qty 2

## 2023-01-27 MED ORDER — SODIUM CHLORIDE 0.9% FLUSH
3.0000 mL | Freq: Two times a day (BID) | INTRAVENOUS | Status: DC
  Administered 2023-01-27 – 2023-01-29 (×4): 3 mL via INTRAVENOUS

## 2023-01-27 MED ORDER — IOHEXOL 350 MG/ML SOLN
75.0000 mL | Freq: Once | INTRAVENOUS | Status: AC | PRN
  Administered 2023-01-27: 75 mL via INTRAVENOUS

## 2023-01-27 MED ORDER — DAPAGLIFLOZIN PROPANEDIOL 10 MG PO TABS
10.0000 mg | ORAL_TABLET | Freq: Every day | ORAL | Status: DC
  Administered 2023-01-28 – 2023-01-29 (×2): 10 mg via ORAL
  Filled 2023-01-27 (×2): qty 1

## 2023-01-27 MED ORDER — SODIUM CHLORIDE 0.9% FLUSH: 3.0000 mL | INTRAVENOUS | Status: DC | PRN

## 2023-01-27 MED ORDER — ACETAMINOPHEN 325 MG PO TABS: 650.0000 mg | ORAL_TABLET | ORAL | Status: DC | PRN

## 2023-01-27 MED ORDER — CITALOPRAM HYDROBROMIDE 20 MG PO TABS
20.0000 mg | ORAL_TABLET | Freq: Every day | ORAL | Status: DC
  Administered 2023-01-27 – 2023-01-29 (×3): 20 mg via ORAL
  Filled 2023-01-27 (×3): qty 1

## 2023-01-27 MED ORDER — ALLOPURINOL 300 MG PO TABS
300.0000 mg | ORAL_TABLET | Freq: Every day | ORAL | Status: DC
  Administered 2023-01-27 – 2023-01-29 (×3): 300 mg via ORAL
  Filled 2023-01-27 (×3): qty 1

## 2023-01-27 MED ORDER — GABAPENTIN 300 MG PO CAPS
300.0000 mg | ORAL_CAPSULE | Freq: Three times a day (TID) | ORAL | Status: DC
  Administered 2023-01-27 – 2023-01-29 (×7): 300 mg via ORAL
  Filled 2023-01-27 (×7): qty 1

## 2023-01-27 MED ORDER — ENOXAPARIN SODIUM 40 MG/0.4ML IJ SOSY
40.0000 mg | PREFILLED_SYRINGE | INTRAMUSCULAR | Status: DC
  Administered 2023-01-27 – 2023-01-29 (×3): 40 mg via SUBCUTANEOUS
  Filled 2023-01-27 (×3): qty 0.4

## 2023-01-27 MED ORDER — LEFLUNOMIDE 10 MG PO TABS
20.0000 mg | ORAL_TABLET | Freq: Every day | ORAL | Status: DC
  Administered 2023-01-27 – 2023-01-29 (×3): 20 mg via ORAL
  Filled 2023-01-27 (×3): qty 2

## 2023-01-27 MED ORDER — GABAPENTIN 600 MG PO TABS: 600.0000 mg | ORAL_TABLET | Freq: Three times a day (TID) | ORAL | Status: DC

## 2023-01-27 MED ORDER — ONDANSETRON HCL 4 MG/2ML IJ SOLN: 4.0000 mg | Freq: Four times a day (QID) | INTRAMUSCULAR | Status: DC | PRN

## 2023-01-27 MED ORDER — PANTOPRAZOLE SODIUM 40 MG PO TBEC
40.0000 mg | DELAYED_RELEASE_TABLET | Freq: Every day | ORAL | Status: DC
  Administered 2023-01-27 – 2023-01-29 (×3): 40 mg via ORAL
  Filled 2023-01-27 (×3): qty 1

## 2023-01-27 MED ORDER — ATORVASTATIN CALCIUM 40 MG PO TABS
40.0000 mg | ORAL_TABLET | Freq: Every evening | ORAL | Status: DC
  Administered 2023-01-27 – 2023-01-28 (×2): 40 mg via ORAL
  Filled 2023-01-27 (×2): qty 1

## 2023-01-27 MED ORDER — FUROSEMIDE 10 MG/ML IJ SOLN: 20.0000 mg | Freq: Two times a day (BID) | INTRAMUSCULAR | Status: DC

## 2023-01-27 MED ORDER — ASPIRIN 81 MG PO TBEC
81.0000 mg | DELAYED_RELEASE_TABLET | Freq: Every day | ORAL | Status: DC
  Administered 2023-01-27 – 2023-01-29 (×3): 81 mg via ORAL
  Filled 2023-01-27 (×3): qty 1

## 2023-01-27 MED ORDER — SODIUM CHLORIDE 0.9 % IV SOLN: 250.0000 mL | INTRAVENOUS | Status: DC | PRN

## 2023-01-27 NOTE — ED Notes (Signed)
ED TO INPATIENT HANDOFF REPORT  ED Nurse Name and Phone #: Loann Quill, Paramedic, Marcello Moores RN. [phone]  S Name/Age/Gender Emily Wheeler 67 y.o. female Room/Bed: 023C/023C  Code Status   Code Status: Prior  Home/SNF/Other Home Patient oriented to: self, place, time, and situation Is this baseline? Yes   Triage Complete: Triage complete  Chief Complaint SOB (shortness of breath) [R06.02]  Triage Note The pt is c/o having sob for 2 weeks and she had a little tingling in her heart this am none now some swelling in her feet and ankles   Allergies Allergies  Allergen Reactions   Lisinopril Cough    Level of Care/Admitting Diagnosis ED Disposition     ED Disposition  Admit   Condition  --   Comment  Hospital Area: [facility] [[id number]]  Level of Care: Telemetry Cardiac [[id number]]  May place patient in observation at [facility] or [facility] if equivalent level of care is available:: Yes  Covid Evaluation: Asymptomatic - no recent exposure (last 10 days) testing not required  Diagnosis: SOB (shortness of breath) [956213]  Admitting Physician: Magnus Ivan [[id number]]  Attending Physician: Nira Conn [[id number]]          B Medical/Surgery History Past Medical History:  Diagnosis Date   Anemia    mild   Anxiety    Arthritis    Asthma    Cardiomyopathy (HCC)    CHF (congestive heart failure) (HCC)    Diabetes mellitus without complication (HCC)    Frequency of urination    at night   GERD (gastroesophageal reflux disease)    potassium worsens reflux   Hemorrhoids, external    Hyperlipidemia    Hypertension    Neuromuscular disorder (HCC)    neuropathy   Numbness and tingling of both legs    Rheumatoid arthritis (HCC)    Vertigo    hx of   Past Surgical History:  Procedure Laterality Date   ABDOMINAL HYSTERECTOMY     CARDIAC CATHETERIZATION N/A 10/28/2014   Procedure: Left Heart Cath and Coronary Angiography;  Surgeon:  Corky Crafts, MD;  Location: [facility] INVASIVE CV LAB;  Service: Cardiovascular;  Laterality: N/A;   CARPAL TUNNEL RELEASE Left    COLONOSCOPY     pt states 11 yr ago in [place] had a colon with 2 polyps- one cecal polyp per pt. one ? location   COLONOSCOPY W/ POLYPECTOMY     CYST REMOVAL NECK     HEMORROIDECTOMY     MAXIMUM ACCESS (MAS)POSTERIOR LUMBAR INTERBODY FUSION (PLIF) 3 LEVEL N/A 10/08/2013   Procedure: FOR MAXIMUM ACCESS (MAS) POSTERIOR LUMBAR INTERBODY FUSION (PLIF) 3 LEVEL;  Surgeon: Maeola Harman, MD;  Location: [facility] NEURO ORS;  Service: Neurosurgery;  Laterality: N/A;  L3-4 L4-5 L5-S1 maximum access posterior lumbar interbody fusion with decompression   TUBAL LIGATION       A IV Location/Drains/Wounds Patient Lines/Drains/Airways Status     Active Line/Drains/Airways     Name Placement date Placement time Site Days   Peripheral IV 01/27/23 20 G Right Antecubital 01/27/23  0131  Antecubital  less than 1            Intake/Output Last 24 hours No intake or output data in the 24 hours ending 01/27/23 8413  Labs/Imaging Results for orders placed or performed during the hospital encounter of 01/26/23 (from the past 48 hour(s))  Basic metabolic panel     Status: Abnormal   Collection Time: 01/26/23 10:54 PM  Result Value Ref Range   Sodium 141 135 - 145 mmol/L   Potassium 3.9 3.5 - 5.1 mmol/L   Chloride 105 98 - 111 mmol/L   CO2 25 22 - 32 mmol/L   Glucose, Bld 107 (H) 70 - 99 mg/dL    Comment: Glucose reference range applies only to samples taken after fasting for at least 8 hours.   BUN 20 8 - 23 mg/dL   Creatinine, Ser 0.98 (H) 0.44 - 1.00 mg/dL   Calcium 9.0 8.9 - 11.9 mg/dL   GFR, Estimated 42 (L) >60 mL/min    Comment: (NOTE) Calculated using the CKD-EPI Creatinine Equation (2021)    Anion gap 11 5 - 15    Comment: Performed at [facility], [address] [place], [place] [address]  CBC     Status: Abnormal   Collection Time: 01/26/23 10:54 PM  Result  Value Ref Range   WBC 5.5 4.0 - 10.5 K/uL   RBC 3.35 (L) 3.87 - 5.11 MIL/uL   Hemoglobin 10.7 (L) 12.0 - 15.0 g/dL   HCT 95.6 (L) 21.3 - 08.6 %   MCV 100.3 (H) 80.0 - 100.0 fL   MCH 31.9 26.0 - 34.0 pg   MCHC 31.8 30.0 - 36.0 g/dL   RDW 57.8 (H) 46.9 - 62.9 %   Platelets 250 150 - 400 K/uL   nRBC 0.4 (H) 0.0 - 0.2 %    Comment: Performed at [facility], [address], [place], [place] [address]  Troponin I (High Sensitivity)     Status: None   Collection Time: 01/26/23 10:54 PM  Result Value Ref Range   Troponin I (High Sensitivity) 10 <18 ng/L    Comment: (NOTE) Elevated high sensitivity troponin I (hsTnI) values and significant  changes across serial measurements may suggest ACS but many other  chronic and acute conditions are known to elevate hsTnI results.  Refer to the "Links" section for chest pain algorithms and additional  guidance. Performed at [facility], [address], [place], [place] [address]   Troponin I (High Sensitivity)     Status: None   Collection Time: 01/27/23  1:39 AM  Result Value Ref Range   Troponin I (High Sensitivity) 12 <18 ng/L    Comment: (NOTE) Elevated high sensitivity troponin I (hsTnI) values and significant  changes across serial measurements may suggest ACS but many other  chronic and acute conditions are known to elevate hsTnI results.  Refer to the "Links" section for chest pain algorithms and additional  guidance. Performed at [facility], [address] [place], [place] [address]   Brain natriuretic peptide     Status: None   Collection Time: 01/27/23  1:39 AM  Result Value Ref Range   B Natriuretic Peptide 30.7 0.0 - 100.0 pg/mL    Comment: Performed at [facility], [address] [place], [place] [address]  D-dimer, quantitative     Status: Abnormal   Collection Time: 01/27/23  1:39 AM  Result Value Ref Range   D-Dimer, Quant 1.95 (H) 0.00 - 0.50 ug/mL-FEU    Comment: (NOTE) At the manufacturer  cut-off value of 0.5 g/mL FEU, this assay has a negative predictive value of 95-100%.This assay is intended for use in conjunction with a clinical pretest probability (PTP) assessment model to exclude pulmonary embolism (PE) and deep venous thrombosis (DVT) in outpatients suspected of PE or DVT. Results should be correlated with clinical presentation. Performed  at [facility], [address], [place], [place] [address]    CT Angio Chest PE W and/or Wo Contrast  Result Date: 01/27/2023 CLINICAL DATA:  Pulmonary embolism suspected, low to intermediate probability, positive D-dimer. Shortness of breath for 2 weeks and tingling in heart. Swelling in feet and ankles. EXAM: CT ANGIOGRAPHY CHEST WITH CONTRAST TECHNIQUE: Multidetector CT imaging of the chest was performed using the standard protocol during bolus administration of intravenous contrast. Multiplanar CT image reconstructions and MIPs were obtained to evaluate the vascular anatomy. RADIATION DOSE REDUCTION: This exam was performed according to the departmental dose-optimization program which includes automated exposure control, adjustment of the mA and/or kV according to patient size and/or use of iterative reconstruction technique. CONTRAST:  75mL OMNIPAQUE IOHEXOL 350 MG/ML SOLN COMPARISON:  None Available. FINDINGS: Cardiovascular: The heart is enlarged and there is a trace pericardial effusion. The aorta and pulmonary trunk are normal in caliber. No pulmonary embolism is seen. Mediastinum/Nodes: No enlarged mediastinal, hilar, or axillary lymph nodes. Thyroid gland, trachea, and esophagus demonstrate no significant findings. Lungs/Pleura: Lungs are clear. No pleural effusion or pneumothorax. Upper Abdomen: No acute abnormality. Musculoskeletal: Degenerative changes are present in the thoracic spine. No acute osseous abnormality. Review of the MIP images confirms the above findings. IMPRESSION: No evidence of pulmonary embolism or other  acute process. Electronically Signed   By: Thornell Sartorius M.D.   On: 01/27/2023 05:03   DG Chest 2 View  Result Date: 01/27/2023 CLINICAL DATA:  Shortness of breath EXAM: CHEST - 2 VIEW COMPARISON:  07/09/2014 FINDINGS: The heart size and mediastinal contours are within normal limits. Both lungs are clear. The visualized skeletal structures are unremarkable. IMPRESSION: No active cardiopulmonary disease. Electronically Signed   By: Charlett Nose M.D.   On: 01/27/2023 00:01    Pending Labs Unresulted Labs (From admission, onward)    None       Vitals/Pain Today's Vitals   01/27/23 0451 01/27/23 0530 01/27/23 0600 01/27/23 0630  BP: 101/78 111/71 112/75 114/78  Pulse: 90     Resp: 16 14 17 13   Temp:      SpO2: 98%     Weight:      Height:      PainSc:        Isolation Precautions No active isolations  Medications Medications  furosemide (LASIX) injection 20 mg (20 mg Intravenous Given 01/27/23 0137)  iohexol (OMNIPAQUE) 350 MG/ML injection 75 mL (75 mLs Intravenous Contrast Given 01/27/23 0351)    Mobility walks     Focused Assessments Pulmonary Assessment Handoff:  Lung sounds:          R Recommendations: See Admitting Provider Note  Report given to:   Additional Notes:

## 2023-01-27 NOTE — ED Provider Notes (Signed)
[place] EMERGENCY DEPARTMENT AT [facility] Provider Note  CSN: [id number] Arrival date & time: 01/26/23 2159  Chief Complaint(s) Shortness of Breath  HPI Emily Wheeler is a 67 y.o. female with past medical history listed below including CHF with a last EF of 38% noted on a cardiac MRI performed last month who presents to the emergency department with 2 to 3 weeks of gradually worsening shortness of breath.  Patient endorses orthopnea, dyspnea on exertion and shortness of breath while speaking.  Also noted bilateral lower extremity edema today.  She endorses dry cough.  No fevers or chills.  No chest pain.  No abdominal pain.  Patient is on Entresto and spironolactone.  States that she was taken off of Lasix a couple years ago.  No prior history of DVT/PEs.  No hormone replacement therapy.  HPI  Past Medical History Past Medical History:  Diagnosis Date   Anemia    mild   Anxiety    Arthritis    Asthma    Cardiomyopathy (HCC)    CHF (congestive heart failure) (HCC)    Diabetes mellitus without complication (HCC)    Frequency of urination    at night   GERD (gastroesophageal reflux disease)    potassium worsens reflux   Hemorrhoids, external    Hyperlipidemia    Hypertension    Neuromuscular disorder (HCC)    neuropathy   Numbness and tingling of both legs    Rheumatoid arthritis (HCC)    Vertigo    hx of   Patient Active Problem List   Diagnosis Date Noted   Gastroesophageal reflux disease with esophagitis without hemorrhage 10/16/2022   Moderate persistent asthma without complication 09/07/2022   Need for prophylactic vaccination with combined diphtheria-tetanus-pertussis (DTP) vaccine 02/11/2022   Need for prophylactic vaccination and inoculation against varicella 02/11/2022   Essential tremor 05/01/2021   Coarse tremors 02/14/2021   Encounter for general adult medical examination with abnormal findings 08/29/2020   Stage 3a chronic kidney disease  (HCC) 11/10/2019   OAB (overactive bladder) 11/09/2019   Greater trochanteric bursitis of both hips 11/09/2019   Degenerative arthritis of knee, bilateral 10/24/2018   Vitamin B12 deficiency anemia due to intrinsic factor deficiency 11/26/2017   Anemia of chronic disease 09/19/2017   Gastroparesis due to DM (HCC) 02/19/2017   GERD without esophagitis 02/19/2017   PAD (peripheral artery disease) (HCC) 10/31/2016   Visit for screening mammogram 10/31/2016   History of colonic polyps 03/09/2015   Abnormal nuclear stress test    Cardiomyopathy (HCC) 10/19/2014   Cardiac LV ejection fraction 30-35% 09/01/2014   Spondylolisthesis of lumbar region 10/08/2013   Rheumatoid arthritis involving multiple joints (HCC) 02/27/2013   Marijuana dependence (HCC) 01/31/2013   Spinal stenosis of lumbar region 01/30/2013   Type II diabetes mellitus with manifestations (HCC) 01/30/2013   Essential hypertension, benign 01/30/2013   Hyperlipidemia with target LDL less than 100 01/30/2013   Home Medication(s) Prior to Admission medications   Medication Sig Start Date End Date Taking? Authorizing Provider  allopurinol (ZYLOPRIM) 300 MG tablet Take 1 tablet by mouth once daily 12/19/22  Yes Judi Saa, DO  aspirin EC 81 MG tablet Take 1 tablet (81 mg total) by mouth daily. 10/16/22  Yes [name], MD  atorvastatin (LIPITOR) 40 MG tablet Take 1 tablet (40 mg total) by mouth daily. 10/29/22  Yes [name], MD  calcium carbonate (OS-CAL) 1250 (500 Ca) MG chewable tablet Chew 1 tablet by mouth daily.  04/23/18  Yes [provider]  carvedilol (COREG) 12.5 MG tablet Take 1 tablet (12.5 mg total) by mouth 2 (two) times daily. 12/10/22  Yes [name], MD  citalopram (CELEXA) 20 MG tablet Take 1 tablet by mouth once daily 01/04/23  Yes [name], MD  dapagliflozin propanediol (FARXIGA) 10 MG TABS tablet Take 1 tablet (10 mg total) by mouth daily before breakfast. 10/16/22  Yes [name], MD  esomeprazole (NEXIUM) 40 MG capsule Take 1 capsule (40 mg total) by mouth daily. 10/16/22  Yes [name], MD  gabapentin (NEURONTIN) 600 MG tablet TAKE 1 TABLET BY MOUTH THREE TIMES DAILY 09/24/22  Yes [name], MD  leflunomide (ARAVA) 20 MG tablet Take 1 tablet by mouth once daily 11/18/22  Yes [name], MD  levalbuterol [facility] HFA) 45 MCG/ACT inhaler Inhale 2 puffs into the lungs every 8 (eight) hours as needed for wheezing. 10/16/22  Yes [name], MD  Omega-3 Fatty Acids (FISH OIL) 1200 MG CAPS Take 1,200 mg by mouth daily.    Yes [provider]  sacubitril-valsartan (ENTRESTO) 97-103 MG Take 1 tablet by mouth 2 (two) times daily. 07/24/22  Yes Meriam Sprague, MD  spironolactone (ALDACTONE) 25 MG tablet Take 1/2 (one-half) tablet by mouth once daily 09/20/22  Yes Pemberton, Kathlynn Grate, MD  TRULICITY 0.75 MG/0.5ML SOPN INJECT 0.75MG  SUBCUTANEOUSLY ONCE A WEEK 10/28/22  Yes [name], MD  Accu-Chek FastClix Lancets MISC USE 1  TO CHECK GLUCOSE THREE TIMES DAILY 04/05/21   [name], MD  Blood Glucose Monitoring Suppl (ACCU-CHEK GUIDE) w/Device KIT 1 Act by Does not apply route 3 (three) times daily. 07/18/21   [name], MD  glucose blood (ACCU-CHEK GUIDE) test strip 1 each by Other route 3 (three) times daily. 07/18/21   [name], MD  Lancets Misc. (ACCU-CHEK FASTCLIX LANCET) KIT Inject 1 Act into the skin 3 (three) times daily. 09/02/18   [name], MD                                                                                                                                    Allergies Lisinopril  Review of Systems Review of Systems As noted in HPI  Physical Exam Vital Signs  I have reviewed the triage vital signs BP 138/86   Pulse (!) 116   Temp 98.9 F (37.2 C)   Resp 18   Ht 5\' 4"  (1.626 m)   Wt 104.3 kg   LMP 05/29/1991   SpO2 98%   BMI 39.47 kg/m   Physical Exam Vitals reviewed.  Constitutional:       General: She is not in acute distress.    Appearance: She is well-developed. She is not diaphoretic.  HENT:     Head: Normocephalic and atraumatic.     Nose: Nose normal.  Eyes:  General: No scleral icterus.       Right eye: No discharge.        Left eye: No discharge.     Conjunctiva/sclera: Conjunctivae normal.     Pupils: Pupils are equal, round, and reactive to light.  Cardiovascular:     Rate and Rhythm: Normal rate and regular rhythm.     Heart sounds: No murmur heard.    No friction rub. No gallop.  Pulmonary:     Effort: Pulmonary effort is normal. Tachypnea present. No respiratory distress.     Breath sounds: Normal breath sounds. No stridor. No wheezing, rhonchi or rales.     Comments: Increased RR after speaking for a couple of minutes Abdominal:     General: There is no distension.     Palpations: Abdomen is soft.     Tenderness: There is no abdominal tenderness.  Musculoskeletal:        General: No tenderness.     Cervical back: Normal range of motion and neck supple.     Right lower leg: 1+ Pitting Edema present.     Left lower leg: 1+ Pitting Edema present.  Skin:    General: Skin is warm and dry.     Findings: No erythema or rash.  Neurological:     Mental Status: She is alert and oriented to person, place, and time.     ED Results and Treatments Labs (all labs ordered are listed, but only abnormal results are displayed) Labs Reviewed  BASIC METABOLIC PANEL - Abnormal; Notable for the following components:      Result Value   Glucose, Bld 107 (*)    Creatinine, Ser 1.37 (*)    GFR, Estimated 42 (*)    All other components within normal limits  CBC - Abnormal; Notable for the following components:   RBC 3.35 (*)    Hemoglobin 10.7 (*)    HCT 33.6 (*)    MCV 100.3 (*)    RDW 16.1 (*)    nRBC 0.4 (*)    All other components within normal limits  BRAIN NATRIURETIC PEPTIDE  D-DIMER, QUANTITATIVE  TROPONIN I (HIGH SENSITIVITY)  TROPONIN I  (HIGH SENSITIVITY)                                                                                                                         EKG  EKG Interpretation Date/Time:  Saturday January 26 2023 21:58:19 EDT Ventricular Rate:  106 PR Interval:    QRS Duration:  112 QT Interval:  356 QTC Calculation: 472 R Axis:   -53  Text Interpretation: Sinus tachycardia Left anterior fascicular block Abnormal ECG When compared with ECG of 30-Sep-2013 14:49, PREVIOUS ECG IS PRESENT Confirmed by Drema Pry [phone]) on 01/27/2023 12:48:20 AM       Radiology DG Chest 2 View  Result Date: 01/27/2023 CLINICAL DATA:  Shortness of breath EXAM: CHEST - 2 VIEW COMPARISON:  07/09/2014 FINDINGS: The heart size and mediastinal contours are  within normal limits. Both lungs are clear. The visualized skeletal structures are unremarkable. IMPRESSION: No active cardiopulmonary disease. Electronically Signed   By: Charlett Nose M.D.   On: 01/27/2023 00:01    Medications Ordered in ED Medications  furosemide (LASIX) injection 20 mg (has no administration in time range)   Procedures Procedures  (including critical care time) Medical Decision Making / ED Course   Medical Decision Making Amount and/or Complexity of Data Reviewed Labs: ordered. Decision-making details documented in ED Course. Radiology: ordered and independent interpretation performed. Decision-making details documented in ED Course. ECG/medicine tests: ordered and independent interpretation performed. Decision-making details documented in ED Course.  Risk Prescription drug management. Parenteral controlled substances.    Shortness of breath  Differential diagnosis considered Most suspicious for CHF exacerbation.  Will assess for electrolyte/metabolic derangements, renal sufficiency, severe anemia.  Rule out pneumonia, pneumothorax, pleural effusions.  Given the indolent onset, less suspicious for PE, but may reconsider if workup is  unrevealing.  EKG with sinus tachycardia. Trop negative. CBC without leukocytosis.  Anemia with stable hemoglobin. BMP without significant electrolyte derangements.  Mild renal insufficiency without AKI. Dimer and BNP pending  Chest x-ray without evidence of pneumonia, pneumothorax, pulmonary edema or pleural effusions.  Presentation is not consistent with asthma exacerbation.  Plan to admit for further work up and management.      Final Clinical Impression(s) / ED Diagnoses Final diagnoses:  SOB (shortness of breath)    This chart was dictated using voice recognition software.  Despite best efforts to proofread,  errors can occur which can change the documentation meaning.    Nira Conn, MD 01/27/23 [phone]

## 2023-01-27 NOTE — H&P (Signed)
History and Physical    Patient: Emily Wheeler:[id number] DOB: December 18, 1955 DOA: 01/26/2023 DOS: the patient was seen and examined on 01/27/2023 PCP: [name], MD  Patient coming from: Home  Chief Complaint:  Chief Complaint  Patient presents with   Shortness of Breath   HPI: Emily Wheeler is a 67 y.o. female with medical history significant of hypertension, hyperlipidemia, HFrEF, asthma, diabetes mellitus type 2, chronic kidney disease stage III, anemia, rheumatoid arthritis, and GERD who presents with complaints of shortness of breath.  Symptoms have been progressively worsening over the last 2-3 weeks.  Breathing was worse with any kind of exertion.  Noted associated symptoms of orthopnea, palpitations, lightheadedness(reported intermittent low blood pressures), dry cough, intermittent nausea relieved with belching, and ankle swelling.  She has not had any significant fever, wheezing, chest pain, vomiting, diarrhea, or recent sick contacts to her knowledge.  She has been taking all of her medications as prescribed which includes spironolactone.  Despite taking medication she felt like her weight was up.  She had been using her albuterol without any relief in her shortness of breath.  Patient just recently had a cardiac MRI that noted EF to be around 38% in July.  She was scheduled to follow-up with cardiology on 9/5.  In the emergency department patient was noted to be afebrile with pulse initially elevated up to 116, and all other vital signs relatively maintained.  Labs noted hemoglobin 10.7 with MCV 103, BUN 20, creatinine 1.37, D-dimer 1.95, BNP 30.7, and high-sensitivity troponins negative x 2.  Chest x-ray was within normal limits.  CT angiogram of the chest PE protocol was obtained which noted no pulmonary embolism, enlarged heart with trace pericardial effusion, and no pleural effusions.  Patient had been given Lasix 20 mg IV.  TRH called to admit.  Review of Systems: As  mentioned in the history of present illness. All other systems reviewed and are negative. Past Medical History:  Diagnosis Date   Anemia    mild   Anxiety    Arthritis    Asthma    Cardiomyopathy (HCC)    CHF (congestive heart failure) (HCC)    Diabetes mellitus without complication (HCC)    Frequency of urination    at night   GERD (gastroesophageal reflux disease)    potassium worsens reflux   Hemorrhoids, external    Hyperlipidemia    Hypertension    Neuromuscular disorder (HCC)    neuropathy   Numbness and tingling of both legs    Rheumatoid arthritis (HCC)    Vertigo    hx of   Past Surgical History:  Procedure Laterality Date   ABDOMINAL HYSTERECTOMY     CARDIAC CATHETERIZATION N/A 10/28/2014   Procedure: Left Heart Cath and Coronary Angiography;  Surgeon: Corky Crafts, MD;  Location: [facility] INVASIVE CV LAB;  Service: Cardiovascular;  Laterality: N/A;   CARPAL TUNNEL RELEASE Left    COLONOSCOPY     pt states 11 yr ago in [place] had a colon with 2 polyps- one cecal polyp per pt. one ? location   COLONOSCOPY W/ POLYPECTOMY     CYST REMOVAL NECK     HEMORROIDECTOMY     MAXIMUM ACCESS (MAS)POSTERIOR LUMBAR INTERBODY FUSION (PLIF) 3 LEVEL N/A 10/08/2013   Procedure: FOR MAXIMUM ACCESS (MAS) POSTERIOR LUMBAR INTERBODY FUSION (PLIF) 3 LEVEL;  Surgeon: Maeola Harman, MD;  Location: [facility] NEURO ORS;  Service: Neurosurgery;  Laterality: N/A;  L3-4 L4-5 L5-S1 maximum access posterior lumbar interbody  fusion with decompression   TUBAL LIGATION     Social History:  reports that she has quit smoking. She has been exposed to tobacco smoke. She has never used smokeless tobacco. She reports that she does not currently use alcohol. She reports that she does not use drugs.  Allergies  Allergen Reactions   Lisinopril Cough    Family History  Problem Relation Age of Onset   Early death Father    Heart disease Father    Hypertension Sister    Hypertension Brother    Diabetes Brother     Colon polyps Mother    Hashimoto's thyroiditis Sister    Colon cancer Maternal Grandmother        in her 53s   Stomach cancer Maternal Aunt    Non-Hodgkin's lymphoma Sister    Alcohol abuse Neg Hx    COPD Neg Hx    Depression Neg Hx    Drug abuse Neg Hx    Hearing loss Neg Hx    Hyperlipidemia Neg Hx    Kidney disease Neg Hx    Stroke Neg Hx    Esophageal cancer Neg Hx    Rectal cancer Neg Hx     Prior to Admission medications   Medication Sig Start Date End Date Taking? Authorizing Provider  allopurinol (ZYLOPRIM) 300 MG tablet Take 1 tablet by mouth once daily 12/19/22  Yes Judi Saa, DO  aspirin EC 81 MG tablet Take 1 tablet (81 mg total) by mouth daily. 10/16/22  Yes [name], MD  atorvastatin (LIPITOR) 40 MG tablet Take 1 tablet (40 mg total) by mouth daily. 10/29/22  Yes [name], MD  calcium carbonate (OS-CAL) 1250 (500 Ca) MG chewable tablet Chew 1 tablet by mouth daily. 04/23/18  Yes [provider]  carvedilol (COREG) 12.5 MG tablet Take 1 tablet (12.5 mg total) by mouth 2 (two) times daily. 12/10/22  Yes [name], MD  citalopram (CELEXA) 20 MG tablet Take 1 tablet by mouth once daily 01/04/23  Yes [name], MD  dapagliflozin propanediol (FARXIGA) 10 MG TABS tablet Take 1 tablet (10 mg total) by mouth daily before breakfast. 10/16/22  Yes [name], MD  esomeprazole (NEXIUM) 40 MG capsule Take 1 capsule (40 mg total) by mouth daily. 10/16/22  Yes [name], MD  gabapentin (NEURONTIN) 600 MG tablet TAKE 1 TABLET BY MOUTH THREE TIMES DAILY 09/24/22  Yes [name], MD  leflunomide (ARAVA) 20 MG tablet Take 1 tablet by mouth once daily 11/18/22  Yes [name], MD  levalbuterol [facility] HFA) 45 MCG/ACT inhaler Inhale 2 puffs into the lungs every 8 (eight) hours as needed for wheezing. 10/16/22  Yes [name], MD  Omega-3 Fatty Acids (FISH OIL) 1200 MG CAPS Take 1,200 mg by mouth daily.    Yes [provider]   sacubitril-valsartan (ENTRESTO) 97-103 MG Take 1 tablet by mouth 2 (two) times daily. 07/24/22  Yes Meriam Sprague, MD  spironolactone (ALDACTONE) 25 MG tablet Take 1/2 (one-half) tablet by mouth once daily 09/20/22  Yes Pemberton, Kathlynn Grate, MD  TRULICITY 0.75 MG/0.5ML SOPN INJECT 0.75MG  SUBCUTANEOUSLY ONCE A WEEK 10/28/22  Yes [name], MD  Accu-Chek FastClix Lancets MISC USE 1  TO CHECK GLUCOSE THREE TIMES DAILY 04/05/21   [name], MD  Blood Glucose Monitoring Suppl (ACCU-CHEK GUIDE) w/Device KIT 1 Act by Does not apply route 3 (three) times daily. 07/18/21   Sanda Linger  L, MD  glucose blood (ACCU-CHEK GUIDE) test strip 1 each by Other route 3 (three) times daily. 07/18/21   [name], MD  Lancets Misc. (ACCU-CHEK FASTCLIX LANCET) KIT Inject 1 Act into the skin 3 (three) times daily. 09/02/18   [name], MD    Physical Exam: Vitals:   01/27/23 0600 01/27/23 0630 01/27/23 0700 01/27/23 0719  BP: 112/75 114/78 123/77   Pulse:   92   Resp: 17 13 17    Temp:    98 F (36.7 C)  TempSrc:    Oral  SpO2:   100%   Weight:      Height:       Constitutional: Elderly female who appears to be in no acute distress at this time Eyes: PERRL, lids and conjunctivae normal ENMT: Mucous membranes are moist.  Normal dentition.  Neck: normal, supple, no significant JVD. Respiratory: Normal respiratory effort at this time without significant wheezes, crackles, or rhonchi appreciated.  Patient able to talk in complete sentences. Cardiovascular: Regular rate and rhythm, no murmurs / rubs / gallops.  Trace pedal edema. 2+ pedal pulses.   Abdomen: no tenderness, no masses palpated.  Bowel sounds positive.  Musculoskeletal: no clubbing / cyanosis. No joint deformity upper and lower extremities. Good ROM, no contractures. Normal muscle tone.  Skin: no rashes, lesions, ulcers. No induration Neurologic: CN 2-12 grossly intact. Sensation intact, DTR normal. Strength 5/5 in all 4.   Psychiatric: Normal judgment and insight. Alert and oriented x 3. Normal mood.   Data Reviewed:  EKG revealed sinus tachycardia at a 106 bpm with left anterior fascicular block and appears similar to previous.  Reviewed labs, imaging, and pertinent records as documented in this note.  Assessment and Plan:  Respiratory distress suspected secondary to heart failure with reduced ejection fraction Acute on chronic.  Patient presents with complaints of worsening shortness of breath.  On physical exam patient without significant lower extremity edema at this time chest x-ray had noted no acute abnormality.  BNP was 30.7, but could falsely low due to patient being obese.  CT angiogram of the chest have been obtained due to elevated D-dimer 1.95, but did not show any signs of a pulmonary embolism or pleural effusion.  Recent cardiac MRI noting LVEF to be approximately 38%.  Patient had been given Lasix 20 mg IV. -Admit to a cardiac telemetry bed -Heart failure order set utilized -Strict I&Os and daily weights -Check echocardiogram -Given additional Lasix 20 mg IV this evening.  Reassess need for additional diuresis in a.m.  Consider discharging home on low-dose p.o. Lasix follow-up with cardiology as previously scheduled on 9/5. -Continue beta-blocker -Case discussed with Dr. Jacques Navy of cardiology over the phone for which limited utility noted for repeat echocardiogram at this time due to the recent cardiac MRI  Palpatations Lightheadness Acute.  Patient did report that she had been feeling lightheaded and been having palpitations. -Check orthostatic vital signs -Follow-up telemetry overnight and ensure no arrhythmias  Essential hypertension Blood pressures currently maintained 110/78 to 139/81, -Continue Coreg -Held [place] and spironolactone initially.  Resume when medically appropriate  Controlled diabetes mellitus type 2, without long-term use of insulin with neuropathy On admission  glucose 107.  Last hemoglobin A1c 5.9 10/16/2022.  Home medication regimen includes Trulicity 0.75 mg q. Friday, Farxiga 10 mg daily, and gabapentin 600 mg 3 times daily. -Continue Farxiga  -Gabapentin reduced to 300 mg due to kidney function  Chronic kidney disease stage IIIa Creatinine 1.37 with  BUN 20.  Creatinine has been around 1.2-1.3 at baseline. -Continue to monitor kidney function with diuresis  Macrocytic anemia Chronic.  Hemoglobin 10.7  with elevated MCV which appears around patient's baseline upon review of records with testing for folate and vitamin B12 levels noted to be elevated in 2023 in 2022. -Continue to monitor  Anxiety -Continue Celexa  Hyperlipidemia -Continue atorvastatin  Rheumatoid arthritis -Continue leflunomide -Check liver function studies  History of gout -Continue allopurinol  GERD -Continue pharmacy substitution of Protonix for Nexium  Obesity BMI 38.36 kg/m   DVT prophylaxis: Lovenox Advance Care Planning:   Code Status: Full Code  Consults: Discussed case with cardiology over the phone but  Family Communication: Son dated over the phone  Severity of Illness: The appropriate patient status for this patient is OBSERVATION. Observation status is judged to be reasonable and necessary in order to provide the required intensity of service to ensure the patient's safety. The patient's presenting symptoms, physical exam findings, and initial radiographic and laboratory data in the context of their medical condition is felt to place them at decreased risk for further clinical deterioration. Furthermore, it is anticipated that the patient will be medically stable for discharge from the hospital within 2 midnights of admission.   Author: Clydie Braun, MD 01/27/2023 8:00 AM  For on call review [email or web address].

## 2023-01-28 DIAGNOSIS — R002 Palpitations: Secondary | ICD-10-CM

## 2023-01-28 DIAGNOSIS — I10 Essential (primary) hypertension: Secondary | ICD-10-CM | POA: Diagnosis not present

## 2023-01-28 DIAGNOSIS — R42 Dizziness and giddiness: Secondary | ICD-10-CM

## 2023-01-28 DIAGNOSIS — I5020 Unspecified systolic (congestive) heart failure: Secondary | ICD-10-CM | POA: Diagnosis not present

## 2023-01-28 DIAGNOSIS — R0603 Acute respiratory distress: Secondary | ICD-10-CM | POA: Diagnosis not present

## 2023-01-28 LAB — BASIC METABOLIC PANEL
Anion gap: 11 (ref 5–15)
BUN: 28 mg/dL — ABNORMAL HIGH (ref 8–23)
CO2: 23 mmol/L (ref 22–32)
Calcium: 8.6 mg/dL — ABNORMAL LOW (ref 8.9–10.3)
Chloride: 103 mmol/L (ref 98–111)
Creatinine, Ser: 1.72 mg/dL — ABNORMAL HIGH (ref 0.44–1.00)
GFR, Estimated: 32 mL/min — ABNORMAL LOW (ref >60)
Glucose, Bld: 123 mg/dL — ABNORMAL HIGH (ref 70–99)
Potassium: 3.5 mmol/L (ref 3.5–5.1)
Sodium: 137 mmol/L (ref 135–145)

## 2023-01-28 MED ORDER — LIVING BETTER WITH HEART FAILURE BOOK: Freq: Once | Status: AC

## 2023-01-28 MED ORDER — INSULIN ASPART 100 UNIT/ML IJ SOLN: 0.0000 [IU] | Freq: Three times a day (TID) | INTRAMUSCULAR | Status: DC

## 2023-01-28 MED ORDER — LIVING WELL WITH DIABETES BOOK
Freq: Once | Status: AC
  Filled 2023-01-28: qty 1

## 2023-01-28 NOTE — Care Management Obs Status (Signed)
MEDICARE OBSERVATION STATUS NOTIFICATION   Patient Details  Name: Emily Wheeler MRN: [id number] Date of Birth: May 12, 1956   Medicare Observation Status Notification Given:  Yes    Epifanio Lesches, RN 01/28/2023, 3:00 PM

## 2023-01-28 NOTE — Progress Notes (Signed)
Mobility Specialist Progress Note:    01/28/23 1618  Mobility  Activity Ambulated with assistance in hallway  Level of Assistance Contact guard assist, steadying assist  Assistive Device Cane  Distance Ambulated (ft) 200 ft  Activity Response Tolerated well  Mobility Referral Yes  $Mobility charge 1 Mobility  Mobility Specialist Start Time (ACUTE ONLY) 1515  Mobility Specialist Stop Time (ACUTE ONLY) 1532  Mobility Specialist Time Calculation (min) (ACUTE ONLY) 17 min   Received pt in bed having no complaints and agreeable to mobility. Had a momentary LOB, but corrected w/ MinA, otherwise, asymptomatic throughout ambulation and returned to room w/o fault. Left in bed w/ call bell in reach and all needs met.   Thompson Grayer Mobility Specialist  Please contact vis Secure Chat or  Rehab Office [phone]

## 2023-01-28 NOTE — Evaluation (Signed)
Physical Therapy Evaluation Patient Details Name: Emily Wheeler MRN: [id number] DOB: [birth year] Today's Date: 01/28/2023  History of Present Illness  67 y/o female presents to [facility] 8/31 with complaints of SOB for 2 weeks, "heart tingles", and LE swelling. Admitted with respiratory distress 2nd to acute on chronic HFrEF. PMHx: CHF, GERD, asthma, essential and coarse tremors, stage 3a CKD, PAD, Cardiomyopathy, RA, type 2 DB, HTN, OA B knees, lumbar stenosis  Clinical Impression  Pt presents with decreased activity tolerance, decreased mobility and decreased endurance secondary to acute on chronic HFrEF. Pt is able to ambulate with CGA and SPC, however, pt became SOB after 90 ft with VSS. Pt was able to slow RR with pursed lipped breathing and rest breaks. Pt is anticipated to progress well towards goals. PT to follow for activity progression.       If plan is discharge home, recommend the following: A little help with walking and/or transfers;A little help with bathing/dressing/bathroom;Help with stairs or ramp for entrance;Assist for transportation;Assistance with cooking/housework   Can travel by private vehicle        Equipment Recommendations None recommended by PT (pt owns equipment)  Recommendations for Other Services       Functional Status Assessment Patient has had a recent decline in their functional status and demonstrates the ability to make significant improvements in function in a reasonable and predictable amount of time.     Precautions / Restrictions Precautions Precautions: Fall Restrictions Weight Bearing Restrictions: No      Mobility  Bed Mobility Overal bed mobility: Independent                  Transfers Overall transfer level: Needs assistance Equipment used: Straight cane Transfers: Sit to/from Stand Sit to Stand: Supervision           General transfer comment: uses SPC for balance    Ambulation/Gait Ambulation/Gait assistance: Contact  guard assist Gait Distance (Feet): 130 Feet Assistive device: Straight cane Gait Pattern/deviations: Decreased stride length, Shuffle, Wide base of support Gait velocity: dec     General Gait Details: increased medial/lateral sway with increased dyspnea after 80 ft, pt reports feeling fatigued. x2 rest breaks to decrease RR with cueing for pursed lip breathing         Balance Overall balance assessment: Mild deficits observed, not formally tested                                           Pertinent Vitals/Pain Pain Assessment Pain Assessment: No/denies pain    Home Living Family/patient expects to be discharged to:: Private residence Living Arrangements: Alone Available Help at Discharge: Family;Available 24 hours/day (son, sister, niece) Type of Home: Apartment Home Access: Stairs to enter Entrance Stairs-Rails: Left Entrance Stairs-Number of Steps: 2 flights   Home Layout: One level Home Equipment: Cane - single Librarian, academic (2 wheels)      Prior Function Prior Level of Function : Independent/Modified Independent             Mobility Comments: SPC for long distances and steps       Extremity/Trunk Assessment   Upper Extremity Assessment Upper Extremity Assessment: Overall WFL for tasks assessed    Lower Extremity Assessment Lower Extremity Assessment: Overall WFL for tasks assessed    Cervical / Trunk Assessment Cervical / Trunk Assessment: Kyphotic  Communication   Communication Communication: No apparent  difficulties  Cognition Arousal: Alert Behavior During Therapy: WFL for tasks assessed/performed Overall Cognitive Status: Within Functional Limits for tasks assessed                                          General Comments General comments (skin integrity, edema, etc.): VSS, 99 SpO2, 144/96 BP in sitting after ambulating    Exercises     Assessment/Plan    PT Assessment Patient needs continued  PT services  PT Problem List Decreased strength;Decreased activity tolerance;Decreased balance;Decreased mobility       PT Treatment Interventions DME instruction;Neuromuscular re-education;Gait training;Stair training;Functional mobility training;Therapeutic activities;Therapeutic exercise;Patient/family education;Balance training    PT Goals (Current goals can be found in the Care Plan section)  Acute Rehab PT Goals Patient Stated Goal: to go home PT Goal Formulation: With patient/family Time For Goal Achievement: 02/11/23 Potential to Achieve Goals: Good    Frequency Min 1X/week     Co-evaluation               AM-PAC PT "6 Clicks" Mobility  Outcome Measure Help needed turning from your back to your side while in a flat bed without using bedrails?: None Help needed moving from lying on your back to sitting on the side of a flat bed without using bedrails?: None Help needed moving to and from a bed to a chair (including a wheelchair)?: A Little Help needed standing up from a chair using your arms (e.g., wheelchair or bedside chair)?: A Little Help needed to walk in hospital room?: A Little Help needed climbing 3-5 steps with a railing? : A Little 6 Click Score: 20    End of Session   Activity Tolerance: Patient limited by fatigue Patient left: in chair;with call bell/phone within reach;with family/visitor present Nurse Communication: Mobility status PT Visit Diagnosis: Other abnormalities of gait and mobility (R26.89);Difficulty in walking, not elsewhere classified (R26.2)    Time: 5284-1324 PT Time Calculation (min) (ACUTE ONLY): 17 min   Charges:   PT Evaluation $PT Eval Low Complexity: 1 Low   PT General Charges $$ ACUTE PT VISIT: 1 Visit         Hilton Cork, PT, DPT Secure Chat Preferred  Rehab Office [phone]   Arturo Morton Brion Aliment 01/28/2023, 11:17 AM

## 2023-01-28 NOTE — Progress Notes (Addendum)
[facility]  PROGRESS NOTE  Emily Wheeler AOZ:[id number] DOB: 27-Mar-1956 DOA: 01/26/2023 PCP: [name], MD   Brief HPI:   67 year old female with history of hypertension, hyperlipidemia, HFrEF, asthma, diabetes mellitus type 2, CKD stage III, anemia, rheumatoid arthritis, GERD presented with shortness of breath.  She had worsening of shortness of breath for past 2 to 3 weeks.  Also had symptoms of orthopnea, palpitations, lightheadedness.  She recently had an MRI, noted to be EF of 33% in July.  She was scheduled to follow-up with cardiology on 9/5. In the ED D-dimer was elevated 1.95 CTA chest was done which showed no pulmonary embolism, showed enlarged heart with trace pericardial effusion.  No pleural effusion.  Patient received Lasix 20 mg IV.    Assessment/Plan:   Acute resp distress -Significantly improved with diuresis -Likely from underlying CHF, echocardiogram was not obtained as per cardiology recommendation. -Cardiac MRI from July 2024 showed EF of 38% -Diuresed well with IV Lasix 20 mg every 12 hours, renal function has worsened this morning with creatinine  1.72 -Will discontinue IV Lasix. -Follow renal function in a.m. -If renal function is stable, can be discharged home on low-dose p.o. Lasix and follow-up with cardiology on 01/31/2023.  Patient has an appointment with cardiology as outpatient.  Palpitations -Resolved -Continue monitoring on telemetry  Hypertension -Blood pressure is soft -Continue Coreg -Entresto and Aldactone on hold due to soft blood pressure  Diabetes mellitus type 2 -Will start sliding scale insulin NovoLog -Continue Farxiga  Medications     allopurinol  300 mg Oral Daily   aspirin EC  81 mg Oral Daily   atorvastatin  40 mg Oral QPM   carvedilol  12.5 mg Oral BID   citalopram  20 mg Oral Daily   dapagliflozin propanediol  10 mg Oral QAC breakfast   enoxaparin (LOVENOX) injection  40 mg Subcutaneous Q24H   gabapentin  300  mg Oral TID   leflunomide  20 mg Oral Daily   pantoprazole  40 mg Oral Daily   sodium chloride flush  3 mL Intravenous Q12H     Data Reviewed:   CBG:  Recent Labs  Lab 01/27/23 0816  GLUCAP 105*    SpO2: 97 %    Vitals:   01/27/23 2028 01/28/23 0018 01/28/23 0422 01/28/23 1115  BP: 109/68 114/68 114/81 99/63  Pulse: 95 88 90 88  Resp: 18 18 18 18   Temp: 97.9 F (36.6 C) 98 F (36.7 C) 98 F (36.7 C) 98.2 F (36.8 C)  TempSrc: Oral Oral Oral Oral  SpO2: 98% 96% 97%   Weight:   101.7 kg   Height:          Data Reviewed:  Basic Metabolic Panel: Recent Labs  Lab 01/26/23 2254 01/28/23 0236  NA 141 137  K 3.9 3.5  CL 105 103  CO2 25 23  GLUCOSE 107* 123*  BUN 20 28*  CREATININE 1.37* 1.72*  CALCIUM 9.0 8.6*    CBC: Recent Labs  Lab 01/26/23 2254  WBC 5.5  HGB 10.7*  HCT 33.6*  MCV 100.3*  PLT 250    LFT Recent Labs  Lab 01/27/23 0826  AST 22  ALT 22  ALKPHOS 74  BILITOT 0.6  PROT 6.6  ALBUMIN 3.9     Antibiotics: Anti-infectives (From admission, onward)    None        DVT prophylaxis: Lovenox  Code Status: Full code  Family Communication: Discussed with patient's son at bedside  CONSULTS    Subjective    Breathing has improved with IV Lasix.  Echocardiogram is still pending   Objective    Physical Examination:  General-appears in no acute distress Heart-S1-S2, regular, no murmur auscultated Lungs-clear to auscultation bilaterally, no wheezing or crackles auscultated Abdomen-soft, nontender, no organomegaly Extremities-no edema in the lower extremities Neuro-alert, oriented x3, no focal deficit noted  Status is: Inpatient:             Meredeth Ide   [facility] If 7PM-7AM, please contact night-coverage at [email or web address], Office  [phone]   01/28/2023, 2:55 PM  LOS: 0 days

## 2023-01-29 DIAGNOSIS — I10 Essential (primary) hypertension: Secondary | ICD-10-CM | POA: Diagnosis not present

## 2023-01-29 DIAGNOSIS — I5020 Unspecified systolic (congestive) heart failure: Secondary | ICD-10-CM | POA: Diagnosis not present

## 2023-01-29 DIAGNOSIS — R002 Palpitations: Secondary | ICD-10-CM | POA: Diagnosis not present

## 2023-01-29 DIAGNOSIS — R0603 Acute respiratory distress: Secondary | ICD-10-CM | POA: Diagnosis not present

## 2023-01-29 DIAGNOSIS — R42 Dizziness and giddiness: Secondary | ICD-10-CM | POA: Diagnosis not present

## 2023-01-29 LAB — GLUCOSE, CAPILLARY: Glucose-Capillary: 116 mg/dL — ABNORMAL HIGH (ref 70–99)

## 2023-01-29 LAB — BASIC METABOLIC PANEL
Anion gap: 14 (ref 5–15)
BUN: 33 mg/dL — ABNORMAL HIGH (ref 8–23)
CO2: 25 mmol/L (ref 22–32)
Calcium: 8.3 mg/dL — ABNORMAL LOW (ref 8.9–10.3)
Chloride: 101 mmol/L (ref 98–111)
Creatinine, Ser: 1.77 mg/dL — ABNORMAL HIGH (ref 0.44–1.00)
GFR, Estimated: 31 mL/min — ABNORMAL LOW (ref >60)
Glucose, Bld: 120 mg/dL — ABNORMAL HIGH (ref 70–99)
Potassium: 3.6 mmol/L (ref 3.5–5.1)
Sodium: 140 mmol/L (ref 135–145)

## 2023-01-29 NOTE — TOC Transition Note (Signed)
Transition of Care [facility]) - CM/SW Discharge Note   Patient Details  Name: Emily Wheeler MRN: [id number] Date of Birth: 1956/01/01  Transition of Care [facility]) CM/SW Contact:  Lawerance Sabal, RN Phone Number: 01/29/2023, 11:06 AM   Clinical Narrative:     DC to home self care   Final next level of care: Home/Self Care Barriers to Discharge: No Barriers Identified   Patient Goals and CMS Choice      Discharge Placement                         Discharge Plan and Services Additional resources added to the After Visit Summary for                  DME Arranged: N/A           HH Agency: NA        Social Determinants of Health (SDOH) Interventions SDOH Screenings   Food Insecurity: No Food Insecurity (01/28/2023)  Housing: High Risk (01/28/2023)  Transportation Needs: No Transportation Needs (01/28/2023)  Utilities: Not At Risk (01/28/2023)  Alcohol Screen: Low Risk  (07/31/2022)  Depression (PHQ2-9): Low Risk  (10/16/2022)  Financial Resource Strain: Low Risk  (07/31/2022)  Physical Activity: Inactive (07/31/2022)  Social Connections: Moderately Isolated (07/31/2022)  Stress: No Stress Concern Present (07/31/2022)  Tobacco Use: Medium Risk (01/26/2023)     Readmission Risk Interventions     No data to display

## 2023-01-29 NOTE — Discharge Summary (Signed)
Physician Discharge Summary   Patient: Emily Wheeler MRN: [id number] DOB: May 12, 1956  Admit date:     01/26/2023  Discharge date: 01/29/23  Discharge Physician: Meredeth Ide   PCP: [name], MD   Recommendations at discharge:   Follow-up cardiology on 01/31/2023 Follow-up PCP in 1 week Check BMP either on 01/31/2023 or at PCP office in 1 week Drink plenty of fluids  Discharge Diagnoses: Principal Problem:   Respiratory distress Active Problems:   Heart failure with reduced ejection fraction (HCC)   Palpitations   Lightheadedness   Essential hypertension, benign   Type II diabetes mellitus with manifestations (HCC)   Stage 3a chronic kidney disease (HCC)   Macrocytic anemia   Anxiety   Hyperlipidemia with target LDL less than 100   Rheumatoid arthritis involving multiple joints (HCC)   Gout   GERD without esophagitis  Resolved Problems:   * No resolved hospital problems. *  Hospital Course: 67 year old female with history of hypertension, hyperlipidemia, HFrEF, asthma, diabetes mellitus type 2, CKD stage III, anemia, rheumatoid arthritis, GERD presented with shortness of breath.  She had worsening of shortness of breath for past 2 to 3 weeks.  Also had symptoms of orthopnea, palpitations, lightheadedness.  She recently had an MRI, noted to be EF of 33% in July.  She was scheduled to follow-up with cardiology on 9/5. In the ED D-dimer was elevated 1.95 CTA chest was done which showed no pulmonary embolism, showed enlarged heart with trace pericardial effusion.  No pleural effusion.  Patient received Lasix 20 mg IV.    Assessment and Plan:  Acute resp distress -Significantly improved with diuresis -Likely from underlying CHF, echocardiogram was not obtained as per cardiology recommendation. -Cardiac MRI from July 2024 showed EF of 38% -She has an appointment to see cardiology on 01/31/2023  Acute kidney injury on CKD stage IIIb -Diuresed well with IV Lasix 20 mg  every 12 hours, renal function has worsened this morning with creatinine  1.77 -IV Lasix was discontinued -Patient wants to go home, I have advised her to drink plenty of fluids.  Recheck BMP on 01/31/2023 or at PCP office in 1 week. -Recommend to hold Entresto and Aldactone for 3 days  Palpitations -Resolved   Hypertension -Blood pressure is soft -Continue Coreg -Entresto and Aldactone on hold due to soft blood pressure   Diabetes mellitus type 2 -Continue Farxiga        Consultants:  Procedures performed:  Disposition: Home Diet recommendation:  Discharge Diet Orders (From admission, onward)     Start     Ordered   01/29/23 0000  Diet - low sodium heart healthy        01/29/23 1030           Regular diet DISCHARGE MEDICATION: Allergies as of 01/29/2023       Reactions   Lisinopril Cough        Medication List     TAKE these medications    Accu-Chek FastClix Lancet Kit Inject 1 Act into the skin 3 (three) times daily.   Accu-Chek FastClix Lancets Misc USE 1  TO CHECK GLUCOSE THREE TIMES DAILY   Accu-Chek Guide test strip Generic drug: glucose blood 1 each by Other route 3 (three) times daily.   Accu-Chek Guide w/Device Kit 1 Act by Does not apply route 3 (three) times daily.   allopurinol 300 MG tablet Commonly known as: ZYLOPRIM Take 1 tablet by mouth once daily   aspirin EC 81 MG  tablet Take 1 tablet (81 mg total) by mouth daily.   atorvastatin 40 MG tablet Commonly known as: LIPITOR Take 1 tablet (40 mg total) by mouth daily.   calcium carbonate 1250 (500 Ca) MG chewable tablet Commonly known as: OS-CAL Chew 1 tablet by mouth daily.   carvedilol 12.5 MG tablet Commonly known as: COREG Take 1 tablet (12.5 mg total) by mouth 2 (two) times daily.   citalopram 20 MG tablet Commonly known as: CELEXA Take 1 tablet by mouth once daily   dapagliflozin propanediol 10 MG Tabs tablet Commonly known as: FARXIGA Take 1 tablet (10 mg total)  by mouth daily before breakfast.   Entresto 97-103 MG Generic drug: sacubitril-valsartan Take 1 tablet by mouth 2 (two) times daily.   esomeprazole 40 MG capsule Commonly known as: NexIUM Take 1 capsule (40 mg total) by mouth daily.   Fish Oil 1200 MG Caps Take 1,200 mg by mouth daily.   gabapentin 600 MG tablet Commonly known as: NEURONTIN TAKE 1 TABLET BY MOUTH THREE TIMES DAILY   leflunomide 20 MG tablet Commonly known as: ARAVA Take 1 tablet by mouth once daily   levalbuterol 45 MCG/ACT inhaler Commonly known as: XOPENEX HFA Inhale 2 puffs into the lungs every 8 (eight) hours as needed for wheezing.   spironolactone 25 MG tablet Commonly known as: ALDACTONE Take 1/2 (one-half) tablet by mouth once daily   Trulicity 0.75 MG/0.5ML Sopn Generic drug: Dulaglutide INJECT 0.75MG  SUBCUTANEOUSLY ONCE A WEEK        Follow-up Information     [name], MD Follow up in 1 week(s).   Specialty: Internal Medicine Why: Check BMP in 1 week if not checked at cardiology office Contact information: [address] [place] [address] [phone]                Discharge Exam: Ceasar Mons Weights   01/27/23 0807 01/28/23 0422 01/29/23 0412  Weight: 101.4 kg 101.7 kg 101.6 kg   General-appears in no acute distress Heart-S1-S2, regular, no murmur auscultated Lungs-clear to auscultation bilaterally, no wheezing or crackles auscultated Abdomen-soft, nontender, no organomegaly Extremities-no edema in the lower extremities Neuro-alert, oriented x3, no focal deficit noted  Condition at discharge: good  The results of significant diagnostics from this hospitalization (including imaging, microbiology, ancillary and laboratory) are listed below for reference.   Imaging Studies: CT Angio Chest PE W and/or Wo Contrast  Result Date: 01/27/2023 CLINICAL DATA:  Pulmonary embolism suspected, low to intermediate probability, positive D-dimer. Shortness of breath for 2  weeks and tingling in heart. Swelling in feet and ankles. EXAM: CT ANGIOGRAPHY CHEST WITH CONTRAST TECHNIQUE: Multidetector CT imaging of the chest was performed using the standard protocol during bolus administration of intravenous contrast. Multiplanar CT image reconstructions and MIPs were obtained to evaluate the vascular anatomy. RADIATION DOSE REDUCTION: This exam was performed according to the departmental dose-optimization program which includes automated exposure control, adjustment of the mA and/or kV according to patient size and/or use of iterative reconstruction technique. CONTRAST:  75mL OMNIPAQUE IOHEXOL 350 MG/ML SOLN COMPARISON:  None Available. FINDINGS: Cardiovascular: The heart is enlarged and there is a trace pericardial effusion. The aorta and pulmonary trunk are normal in caliber. No pulmonary embolism is seen. Mediastinum/Nodes: No enlarged mediastinal, hilar, or axillary lymph nodes. Thyroid gland, trachea, and esophagus demonstrate no significant findings. Lungs/Pleura: Lungs are clear. No pleural effusion or pneumothorax. Upper Abdomen: No acute abnormality. Musculoskeletal: Degenerative changes are present in the thoracic spine. No acute osseous  abnormality. Review of the MIP images confirms the above findings. IMPRESSION: No evidence of pulmonary embolism or other acute process. Electronically Signed   By: Thornell Sartorius M.D.   On: 01/27/2023 05:03   DG Chest 2 View  Result Date: 01/27/2023 CLINICAL DATA:  Shortness of breath EXAM: CHEST - 2 VIEW COMPARISON:  07/09/2014 FINDINGS: The heart size and mediastinal contours are within normal limits. Both lungs are clear. The visualized skeletal structures are unremarkable. IMPRESSION: No active cardiopulmonary disease. Electronically Signed   By: Charlett Nose M.D.   On: 01/27/2023 00:01    Microbiology: Results for orders placed or performed in visit on 11/09/19  CULTURE, URINE COMPREHENSIVE     Status: None   Collection Time: 11/09/19  11:56 AM  Result Value Ref Range Status   Source: NOT GIVEN  Final   Status: FINAL  Final   RESULT: No Growth  Final    Labs: CBC: Recent Labs  Lab 01/26/23 2254  WBC 5.5  HGB 10.7*  HCT 33.6*  MCV 100.3*  PLT 250   Basic Metabolic Panel: Recent Labs  Lab 01/26/23 2254 01/28/23 0236 01/29/23 0531  NA 141 137 140  K 3.9 3.5 3.6  CL 105 103 101  CO2 25 23 25   GLUCOSE 107* 123* 120*  BUN 20 28* 33*  CREATININE 1.37* 1.72* 1.77*  CALCIUM 9.0 8.6* 8.3*   Liver Function Tests: Recent Labs  Lab 01/27/23 0826  AST 22  ALT 22  ALKPHOS 74  BILITOT 0.6  PROT 6.6  ALBUMIN 3.9   CBG: Recent Labs  Lab 01/27/23 0816 01/29/23 0748  GLUCAP 105* 116*    Discharge time spent: greater than 30 minutes.  Signed: Meredeth Ide, MD [facility] 01/29/2023

## 2023-01-29 NOTE — Progress Notes (Signed)
Heart Failure Navigator Progress Note  Assessed for Heart & Vascular TOC clinic readiness.  Patient does not meet criteria due to follow up with [facility] on  9/5.   Navigator will sign off at this time.    Breezy Hertenstein,RN, BSN,MSN Heart Failure Nurse Navigator. Contact by secure chat only.

## 2023-01-30 NOTE — Progress Notes (Unsigned)
Office Visit    Patient Name: Emily Wheeler Date of Encounter: 01/31/2023  PCP:  [name], MD   [facility]  Cardiologist:  Meriam Sprague, MD (Inactive)  Advanced Practice Provider:  No care team member to display Electrophysiologist:  None  HPI    Emily Wheeler is a 67 y.o. female with a past medical history significant for HFrEF with improved EF, mild to moderate coronary artery disease, diabetes mellitus type 2, RA, hypertension, and hyperlipidemia presents today for 44-month follow-up.  Patient has history of nonischemic cardiomyopathy that was diagnosed in 2016.  She initially presented with a complaint of dyspnea.  Echocardiogram 08/2014 showed reduced EF at 35 to 40%.  Underwent a nuclear stress test which was abnormal, leading to cardiac catheterization.  Cardiac catheterization June 2016 showed mild CAD thus a diagnosis of nonischemic cardiomyopathy was made.  TTE 03/2016 showed reduced LVEF at 35 to 40%.  Grade 1 DD with diffuse hypokinesis.  No valvular abnormalities.  Repeat TTE 12/21 with EF 40 to 45%.  Seen by Tereso Newcomer 10/2019 and was doing well.  No heart failure symptoms.  She was last seen by Dr. Shari Prows 06/2021 and she was feeling okay aside for some occasional SOB.  This was an ongoing symptom that may be worsening per the patient.  Her SOB occurred during walking or climbing stairs.  She denies any chest pain or lower extremity edema.  Blood pressure at home was well controlled.  2 prior back surgeries and was suffering from back pain which is chronic and bursitis in her hips bilaterally.  She lost 3 to 4 pounds by working on dietary changes.  Family history significant for her father who died at 38 years old with heart failure her paternal uncles also died from heart failure at an early age.  She was seen by me 01/22/2023, she tells me that she is feeling palpitations a few times a week.  She states it feels like fluttering in her  chest.  She is not sure how long it lasts.  She also states her legs get tired specially going up and down stairs.  She has not had an echocardiogram lately.  Her blood pressure is stable but her pulse rate is elevated at 92 bpm.  She denies chest pain and shortness of breath.  She went to the ER for worsening shortness of breath.  Also had symptoms of orthopnea, palpitations, and lightheadedness.  D-dimer was elevated at 1.95.  CT of the chest was done which showed no pulmonary embolism, enlarged heart with trace pericardial effusion.  No pleural effusion.  Patient received Lasix 20 mg IV.  Today, she tells me that when she went to the hospital her weight was 228.  She was fluid overloaded.  Very short of breath.  Today weight is down to 223.  Feeling much better.  She weighs herself in the nude daily.  She knows that if she gains more than 2 to 3 pounds overnight or 5 pounds in a week to give [place] a call.  She has had months of shortness of breath luckily, feels better.  She does endorse some palpitations and these occur pretty regularly.  We went through her monitor from last year.  I do think that maybe she has had some electrolyte issues since being on high doses of IV Lasix while in the hospital.  We plan to check a BMP next week.  If palpitations continue we discussed  either increasing her carvedilol or ordering another monitor.  Reports no chest pain, pressure, or tightness. No edema, orthopnea, PND. Reports no palpitations.    Past Medical History    Past Medical History:  Diagnosis Date   Anemia    mild   Anxiety    Arthritis    Asthma    Cardiomyopathy (HCC)    CHF (congestive heart failure) (HCC)    Diabetes mellitus without complication (HCC)    Frequency of urination    at night   GERD (gastroesophageal reflux disease)    potassium worsens reflux   Hemorrhoids, external    Hyperlipidemia    Hypertension    Neuromuscular disorder (HCC)    neuropathy   Numbness and tingling of  both legs    Rheumatoid arthritis (HCC)    Vertigo    hx of   Past Surgical History:  Procedure Laterality Date   ABDOMINAL HYSTERECTOMY     CARDIAC CATHETERIZATION N/A 10/28/2014   Procedure: Left Heart Cath and Coronary Angiography;  Surgeon: Corky Crafts, MD;  Location: [facility] INVASIVE CV LAB;  Service: Cardiovascular;  Laterality: N/A;   CARPAL TUNNEL RELEASE Left    COLONOSCOPY     pt states 11 yr ago in [place] had a colon with 2 polyps- one cecal polyp per pt. one ? location   COLONOSCOPY W/ POLYPECTOMY     CYST REMOVAL NECK     HEMORROIDECTOMY     MAXIMUM ACCESS (MAS)POSTERIOR LUMBAR INTERBODY FUSION (PLIF) 3 LEVEL N/A 10/08/2013   Procedure: FOR MAXIMUM ACCESS (MAS) POSTERIOR LUMBAR INTERBODY FUSION (PLIF) 3 LEVEL;  Surgeon: Maeola Harman, MD;  Location: [facility] NEURO ORS;  Service: Neurosurgery;  Laterality: N/A;  L3-4 L4-5 L5-S1 maximum access posterior lumbar interbody fusion with decompression   TUBAL LIGATION      Allergies  Allergies  Allergen Reactions   Lisinopril Cough   EKGs/Labs/Other Studies Reviewed:   The following studies were reviewed today:  Right LE Venous Doppler 03/03/2021: Summary:  RIGHT:  - No evidence of deep vein thrombosis in the lower extremity. No indirect  evidence of obstruction proximal to the inguinal ligament.  - No cystic structure found in the popliteal fossa.  - 0.17 cm x 0.17 cm knot behind knee with no color flow; possible lipoma     LEFT:  - No evidence of common femoral vein obstruction.   TTE 04/2020: IMPRESSIONS    1. No left ventricular thrombus is seen. Left ventricular ejection  fraction, by estimation, is 40 to 45%. Left ventricular ejection fraction  is 43% by biplane modified Simpson's method. The left ventricle has mildly  decreased function. The left  ventricle demonstrates global hypokinesis. The left ventricular internal  cavity size was mildly dilated. Left ventricular diastolic parameters are  consistent with Grade  I diastolic dysfunction (impaired relaxation).   2. Right ventricular systolic function is normal. The right ventricular  size is normal.   3. The mitral valve is normal in structure. No evidence of mitral valve  regurgitation. No evidence of mitral stenosis.   4. The aortic valve is normal in structure. Aortic valve regurgitation is  not visualized. No aortic stenosis is present.   5. The inferior vena cava is normal in size with greater than 50%  respiratory variability, suggesting right atrial pressure of 3 mmHg.   Comparison(s): Prior images reviewed side by side. The left ventricular  function has improved.    ABIs 11/2019 Normal   Echocardiogram 04/27/20 EF 40-45, Gr 1  DD, normal RVSF   Echocardiogram 12/25/19 EF 30-35, normal RVSF, trivial MR   Echocardiogram 04/02/2018 EF 35-40, moderate diffuse HK, grade 1 diastolic dysfunction, mildly calcified aortic valve leaflets, atrial septal lipomatous hypertrophy, trivial PI   ABIs 10/31/2016 Normal    Echocardiogram 04/10/2016 EF 35-40   Cardiac catheterization 10/28/2014 LAD mild diffuse disease RI irregularities LCx ostial 50, mid 25 RCA mild disease; RPDA 60 (small vessel) EF 35-45, LVEDP 18  EKG:  EKG is  not ordered today.    Recent Labs: 01/26/2023: Hemoglobin 10.7; Platelets 250 01/27/2023: ALT 22; B Natriuretic Peptide 30.7; TSH 2.984 01/29/2023: BUN 33; Creatinine, Ser 1.77; Potassium 3.6; Sodium 140  Recent Lipid Panel    Component Value Date/Time   CHOL 140 02/06/2022 1124   TRIG 123.0 02/06/2022 1124   HDL 47.90 02/06/2022 1124   CHOLHDL 3 02/06/2022 1124   VLDL 24.6 02/06/2022 1124   LDLCALC 68 02/06/2022 1124   LDLDIRECT 162.5 01/30/2013 1602    Home Medications   Current Meds  Medication Sig   Accu-Chek FastClix Lancets MISC USE 1  TO CHECK GLUCOSE THREE TIMES DAILY   allopurinol (ZYLOPRIM) 300 MG tablet Take 1 tablet by mouth once daily   aspirin EC 81 MG tablet Take 1 tablet (81 mg total) by mouth  daily.   atorvastatin (LIPITOR) 40 MG tablet Take 1 tablet (40 mg total) by mouth daily.   Blood Glucose Monitoring Suppl (ACCU-CHEK GUIDE) w/Device KIT 1 Act by Does not apply route 3 (three) times daily.   calcium carbonate (OS-CAL) 1250 (500 Ca) MG chewable tablet Chew 1 tablet by mouth daily.   citalopram (CELEXA) 20 MG tablet Take 1 tablet by mouth once daily   dapagliflozin propanediol (FARXIGA) 10 MG TABS tablet Take 1 tablet (10 mg total) by mouth daily before breakfast.   esomeprazole (NEXIUM) 40 MG capsule Take 1 capsule (40 mg total) by mouth daily.   gabapentin (NEURONTIN) 600 MG tablet TAKE 1 TABLET BY MOUTH THREE TIMES DAILY   glucose blood (ACCU-CHEK GUIDE) test strip 1 each by Other route 3 (three) times daily.   Lancets Misc. (ACCU-CHEK FASTCLIX LANCET) KIT Inject 1 Act into the skin 3 (three) times daily.   leflunomide (ARAVA) 20 MG tablet Take 1 tablet by mouth once daily   levalbuterol (XOPENEX HFA) 45 MCG/ACT inhaler Inhale 2 puffs into the lungs every 8 (eight) hours as needed for wheezing.   Omega-3 Fatty Acids (FISH OIL) 1200 MG CAPS Take 1,200 mg by mouth daily.    sacubitril-valsartan (ENTRESTO) 97-103 MG Take 1 tablet by mouth 2 (two) times daily.   spironolactone (ALDACTONE) 25 MG tablet Take 1/2 (one-half) tablet by mouth once daily   TRULICITY 0.75 MG/0.5ML SOPN INJECT 0.75MG  SUBCUTANEOUSLY ONCE A WEEK   [DISCONTINUED] carvedilol (COREG) 12.5 MG tablet Take 1 tablet (12.5 mg total) by mouth 2 (two) times daily.     Review of Systems      All other systems reviewed and are otherwise negative except as noted above.  Physical Exam    VS:  BP 120/66   Pulse 84   Ht 5\' 4"  (1.626 m)   Wt 223 lb (101.2 kg)   LMP 05/29/1991   SpO2 96%   BMI 38.28 kg/m  , BMI Body mass index is 38.28 kg/m.  Wt Readings from Last 3 Encounters:  01/31/23 223 lb (101.2 kg)  01/29/23 224 lb (101.6 kg)  11/08/22 230 lb (104.3 kg)     GEN: Well nourished, well  developed, in no  acute distress. HEENT: normal. Neck: Supple, no JVD, carotid bruits, or masses. Cardiac: RRR, no murmurs, rubs, or gallops. No clubbing, cyanosis, edema.  Radials/PT 2+ and equal bilaterally.  Respiratory:  Respirations regular and unlabored, clear to auscultation bilaterally. GI: Soft, nontender, nondistended. MS: No deformity or atrophy. Skin: Warm and dry, no rash. Neuro:  Strength and sensation are intact. Psych: Normal affect.  Assessment & Plan    Palpitations -plan to update labs next week --defer monitor changes and medication changes for now -Recent labs 02/06/2022 showed potassium 4.3, creatinine 1.3, no magnesium, TSH 2.98 (12/22)  Chronic systolic heart failure with improved EF/nonischemic cardiomyopathy -Euvolemic on exam -Continue current medication regimen of aspirin 81 mg daily, Lipitor 40 mg daily, Coreg 12.5 mg twice daily and an extra 6.25 as needed for palpitations, Farxiga 10 mg daily, omega-3 fatty acids 1200 mg daily, Entresto 97-103 mg twice daily, spironolactone 12.5 mg daily -tolerating all medications   Essential hypertension -Continue current medication regimen -Well-controlled today at 120/66  Coronary artery disease -no angina -continue current medications  Hyperlipidemia -Recent LDL 68, HDL 47, total cholesterol 191, triglycerides 123 (02/06/2022) -Continue Lipitor 40 mg daily -Will be due for updated lipid panel with PCP  Type 2 diabetes mellitus -A1c 5.9, improved -Continue current medications, per PCP       Disposition: Follow up 4 months with Meriam Sprague, MD (Inactive) or APP.  Signed, Sharlene Dory, PA-C 01/31/2023, 10:20 AM [facility]

## 2023-01-31 ENCOUNTER — Encounter: Payer: Self-pay | Admitting: Physician Assistant

## 2023-01-31 ENCOUNTER — Ambulatory Visit: Payer: 59 | Attending: Physician Assistant | Admitting: Physician Assistant

## 2023-01-31 VITALS — BP 120/66 | HR 84 | Ht 64.0 in | Wt 223.0 lb

## 2023-01-31 DIAGNOSIS — I2510 Atherosclerotic heart disease of native coronary artery without angina pectoris: Secondary | ICD-10-CM

## 2023-01-31 DIAGNOSIS — I10 Essential (primary) hypertension: Secondary | ICD-10-CM

## 2023-01-31 DIAGNOSIS — R002 Palpitations: Secondary | ICD-10-CM | POA: Diagnosis not present

## 2023-01-31 DIAGNOSIS — I255 Ischemic cardiomyopathy: Secondary | ICD-10-CM

## 2023-01-31 DIAGNOSIS — I5020 Unspecified systolic (congestive) heart failure: Secondary | ICD-10-CM

## 2023-01-31 DIAGNOSIS — I25118 Atherosclerotic heart disease of native coronary artery with other forms of angina pectoris: Secondary | ICD-10-CM | POA: Diagnosis not present

## 2023-01-31 DIAGNOSIS — Z79899 Other long term (current) drug therapy: Secondary | ICD-10-CM | POA: Diagnosis not present

## 2023-01-31 DIAGNOSIS — R0602 Shortness of breath: Secondary | ICD-10-CM

## 2023-01-31 DIAGNOSIS — E782 Mixed hyperlipidemia: Secondary | ICD-10-CM

## 2023-01-31 MED ORDER — CARVEDILOL 12.5 MG PO TABS: 12.5000 mg | ORAL_TABLET | Freq: Two times a day (BID) | ORAL | 2 refills | Status: DC

## 2023-01-31 NOTE — Patient Instructions (Addendum)
Medication Instructions:    START TAKING: COREG 12.5 MG TWICE A DAY AND MAY TAKE 6.25 MG  EXTRA  1/2 TABLET FOR PALPITATIONS. AS NEEDED   *If you need a refill on your cardiac medications before your next appointment, please call your pharmacy*   Lab Work: RETURN IN A WEEK FOR BMET AND BNP   If you have labs (blood work) drawn today and your tests are completely normal, you will receive your results only by: MyChart Message (if you have MyChart) OR A paper copy in the mail If you have any lab test that is abnormal or we need to change your treatment, we will call you to review the results.   Testing/Procedures: NONE ORDERED  TODAY    Follow-Up: At [facility], you and your health needs are our priority.  As part of our continuing mission to provide you with exceptional heart care, we have created designated Provider Care Teams.  These Care Teams include your primary Cardiologist (physician) and Advanced Practice Providers (APPs -  Physician Assistants and Nurse Practitioners) who all work together to provide you with the care you need, when you need it.  We recommend signing up for the patient portal called "MyChart".  Sign up information is provided on this After Visit Summary.  MyChart is used to connect with patients for Virtual Visits (Telemedicine).  Patients are able to view lab/test results, encounter notes, upcoming appointments, etc.  Non-urgent messages can be sent to your provider as well.   To learn more about what you can do with MyChart, go to [email or web address].    Your next appointment:  (WAS [place] PATIENT )    NEW PATIENT  4 month(s)  Provider:   Dr Cristal Deer or [place]  Other Instructions  Low-Sodium Eating Plan Salt (sodium) helps you keep a healthy balance of fluids in your body. Too much sodium can raise your blood pressure. It can also cause fluid and waste to be held in your body. Your health care provider or dietitian may recommend a  low-sodium eating plan if you have high blood pressure (hypertension), kidney disease, liver disease, or heart failure. Eating less sodium can help lower your blood pressure and reduce swelling. It can also protect your heart, liver, and kidneys. What are tips for following this plan? Reading food labels  Check food labels for the amount of sodium per serving. If you eat more than one serving, you must multiply the listed amount by the number of servings. Choose foods with less than 140 milligrams (mg) of sodium per serving. Avoid foods with 300 mg of sodium or more per serving. Always check how much sodium is in a product, even if the label says "unsalted" or "no salt added." Shopping  Buy products labeled as "low-sodium" or "no salt added." Buy fresh foods. Avoid canned foods and pre-made or frozen meals. Avoid canned, cured, or processed meats. Buy breads that have less than 80 mg of sodium per slice. Cooking  Eat more home-cooked food. Try to eat less restaurant, buffet, and fast food. Try not to add salt when you cook. Use salt-free seasonings or herbs instead of table salt or sea salt. Check with your provider or pharmacist before using salt substitutes. Cook with plant-based oils, such as canola, sunflower, or olive oil. Meal planning When eating at a restaurant, ask if your food can be made with less salt or no salt. Avoid dishes labeled as brined, pickled, cured, or smoked. Avoid dishes made with soy sauce,  miso, or teriyaki sauce. Avoid foods that have monosodium glutamate (MSG) in them. MSG may be added to some restaurant food, sauces, soups, bouillon, and canned foods. Make meals that can be grilled, baked, poached, roasted, or steamed. These are often made with less sodium. General information Try to limit your sodium intake to 1,500-2,300 mg each day, or the amount told by your provider. What foods should I eat? Fruits Fresh, frozen, or canned fruit. Fruit  juice. Vegetables Fresh or frozen vegetables. "No salt added" canned vegetables. "No salt added" tomato sauce and paste. Low-sodium or reduced-sodium tomato and vegetable juice. Grains Low-sodium cereals, such as oats, puffed wheat and rice, and shredded wheat. Low-sodium crackers. Unsalted rice. Unsalted pasta. Low-sodium bread. Whole grain breads and whole grain pasta. Meats and other proteins Fresh or frozen meat, poultry, seafood, and fish. These should have no added salt. Low-sodium canned tuna and salmon. Unsalted nuts. Dried peas, beans, and lentils without added salt. Unsalted canned beans. Eggs. Unsalted nut butters. Dairy Milk. Soy milk. Cheese that is naturally low in sodium, such as ricotta cheese, fresh mozzarella, or Swiss cheese. Low-sodium or reduced-sodium cheese. Cream cheese. Yogurt. Seasonings and condiments Fresh and dried herbs and spices. Salt-free seasonings. Low-sodium mustard and ketchup. Sodium-free salad dressing. Sodium-free light mayonnaise. Fresh or refrigerated horseradish. Lemon juice. Vinegar. Other foods Homemade, reduced-sodium, or low-sodium soups. Unsalted popcorn and pretzels. Low-salt or salt-free chips. The items listed above may not be all the foods and drinks you can have. Talk to a dietitian to learn more. What foods should I avoid? Vegetables Sauerkraut, pickled vegetables, and relishes. Olives. [place] fries. Onion rings. Regular canned vegetables, except low-sodium or reduced-sodium items. Regular canned tomato sauce and paste. Regular tomato and vegetable juice. Frozen vegetables in sauces. Grains Instant hot cereals. Bread stuffing, pancake, and biscuit mixes. Croutons. Seasoned rice or pasta mixes. Noodle soup cups. Boxed or frozen macaroni and cheese. Regular salted crackers. Self-rising flour. Meats and other proteins Meat or fish that is salted, canned, smoked, spiced, or pickled. Precooked or cured meat, such as sausages or meat loaves. Tomasa Blase.  Ham. Pepperoni. Hot dogs. Corned beef. Chipped beef. Salt pork. Jerky. Pickled herring, anchovies, and sardines. Regular canned tuna. Salted nuts. Dairy Processed cheese and cheese spreads. Hard cheeses. Cheese curds. Blue cheese. Feta cheese. String cheese. Regular cottage cheese. Buttermilk. Canned milk. Fats and oils Salted butter. Regular margarine. Ghee. Bacon fat. Seasonings and condiments Onion salt, garlic salt, seasoned salt, table salt, and sea salt. Canned and packaged gravies. Worcestershire sauce. Tartar sauce. Barbecue sauce. Teriyaki sauce. Soy sauce, including reduced-sodium soy sauce. Steak sauce. Fish sauce. Oyster sauce. Cocktail sauce. Horseradish that you find on the shelf. Regular ketchup and mustard. Meat flavorings and tenderizers. Bouillon cubes. Hot sauce. Pre-made or packaged marinades. Pre-made or packaged taco seasonings. Relishes. Regular salad dressings. Salsa. Other foods Salted popcorn and pretzels. Corn chips and puffs. Potato and tortilla chips. Canned or dried soups. Pizza. Frozen entrees and pot pies. The items listed above may not be all the foods and drinks you should avoid. Talk to a dietitian to learn more. This information is not intended to replace advice given to you by your health care provider. Make sure you discuss any questions you have with your health care provider. Document Revised: 05/31/2022 Document Reviewed: 05/31/2022 Elsevier Patient Education  2024 [employer]  Heart-Healthy Eating Plan Eating a healthy diet is important for the health of your heart. A heart-healthy eating plan includes: Eating less unhealthy fats. Eating more  healthy fats. Eating less salt in your food. Salt is also called sodium. Making other changes in your diet. Talk with your doctor or a diet specialist (dietitian) to create an eating plan that is right for you. What is my plan? Your doctor may recommend an eating plan that includes: Total fat: ______% or less  of total calories a day. Saturated fat: ______% or less of total calories a day. Cholesterol: less than _________mg a day. Sodium: less than _________mg a day. What are tips for following this plan? Cooking Avoid frying your food. Try to bake, boil, grill, or broil it instead. You can also reduce fat by: Removing the skin from poultry. Removing all visible fats from meats. Steaming vegetables in water or broth. Meal planning  At meals, divide your plate into four equal parts: Fill one-half of your plate with vegetables and green salads. Fill one-fourth of your plate with whole grains. Fill one-fourth of your plate with lean protein foods. Eat 2-4 cups of vegetables per day. One cup of vegetables is: 1 cup (91 g) broccoli or cauliflower florets. 2 medium carrots. 1 large bell pepper. 1 large sweet potato. 1 large tomato. 1 medium white potato. 2 cups (150 g) raw leafy greens. Eat 1-2 cups of fruit per day. One cup of fruit is: 1 small apple 1 large banana 1 cup (237 g) mixed fruit, 1 large orange,  cup (82 g) dried fruit, 1 cup (240 mL) 100% fruit juice. Eat more foods that have soluble fiber. These are apples, broccoli, carrots, beans, peas, and barley. Try to get 20-30 g of fiber per day. Eat 4-5 servings of nuts, legumes, and seeds per week: 1 serving of dried beans or legumes equals  cup (90 g) cooked. 1 serving of nuts is  oz (12 almonds, 24 pistachios, or 7 walnut halves). 1 serving of seeds equals  oz (8 g). General information Eat more home-cooked food. Eat less restaurant, buffet, and fast food. Limit or avoid alcohol. Limit foods that are high in starch and sugar. Avoid fried foods. Lose weight if you are overweight. Keep track of how much salt (sodium) you eat. This is important if you have high blood pressure. Ask your doctor to tell you more about this. Try to add vegetarian meals each week. Fats Choose healthy fats. These include olive oil and canola  oil, flaxseeds, walnuts, almonds, and seeds. Eat more omega-3 fats. These include salmon, mackerel, sardines, tuna, flaxseed oil, and ground flaxseeds. Try to eat fish at least 2 times each week. Check food labels. Avoid foods with trans fats or high amounts of saturated fat. Limit saturated fats. These are often found in animal products, such as meats, butter, and cream. These are also found in plant foods, such as palm oil, palm kernel oil, and coconut oil. Avoid foods with partially hydrogenated oils in them. These have trans fats. Examples are stick margarine, some tub margarines, cookies, crackers, and other baked goods. What foods should I eat? Fruits All fresh, canned (in natural juice), or frozen fruits. Vegetables Fresh or frozen vegetables (raw, steamed, roasted, or grilled). Green salads. Grains Most grains. Choose whole wheat and whole grains most of the time. Rice and pasta, including brown rice and pastas made with whole wheat. Meats and other proteins Lean, well-trimmed beef, veal, pork, and lamb. Chicken and [place] without skin. All fish and shellfish. Wild duck, rabbit, pheasant, and venison. Egg whites or low-cholesterol egg substitutes. Dried beans, peas, lentils, and tofu. Seeds and most  nuts. Dairy Low-fat or nonfat cheeses, including ricotta and mozzarella. Skim or 1% milk that is liquid, powdered, or evaporated. Buttermilk that is made with low-fat milk. Nonfat or low-fat yogurt. Fats and oils Non-hydrogenated (trans-free) margarines. Vegetable oils, including soybean, sesame, sunflower, olive, peanut, safflower, corn, canola, and cottonseed. Salad dressings or mayonnaise made with a vegetable oil. Beverages Mineral water. Coffee and tea. Diet carbonated beverages. Sweets and desserts Sherbet, gelatin, and fruit ice. Small amounts of dark chocolate. Limit all sweets and desserts. Seasonings and condiments All seasonings and condiments. The items listed above may not  be a complete list of foods and drinks you can eat. Contact a dietitian for more options. What foods should I avoid? Fruits Canned fruit in heavy syrup. Fruit in cream or butter sauce. Fried fruit. Limit coconut. Vegetables Vegetables cooked in cheese, cream, or butter sauce. Fried vegetables. Grains Breads that are made with saturated or trans fats, oils, or whole milk. Croissants. Sweet rolls. Donuts. High-fat crackers, such as cheese crackers. Meats and other proteins Fatty meats, such as hot dogs, ribs, sausage, bacon, rib-eye roast or steak. High-fat deli meats, such as salami and bologna. Caviar. Domestic duck and goose. Organ meats, such as liver. Dairy Cream, sour cream, cream cheese, and creamed cottage cheese. Whole-milk cheeses. Whole or 2% milk that is liquid, evaporated, or condensed. Whole buttermilk. Cream sauce or high-fat cheese sauce. Yogurt that is made from whole milk. Fats and oils Meat fat, or shortening. Cocoa butter, hydrogenated oils, palm oil, coconut oil, palm kernel oil. Solid fats and shortenings, including bacon fat, salt pork, lard, and butter. Nondairy cream substitutes. Salad dressings with cheese or sour cream. Beverages Regular sodas and juice drinks with added sugar. Sweets and desserts Frosting. Pudding. Cookies. Cakes. Pies. Milk chocolate or white chocolate. Buttered syrups. Full-fat ice cream or ice cream drinks. The items listed above may not be a complete list of foods and drinks to avoid. Contact a dietitian for more information. Summary Heart-healthy meal planning includes eating less unhealthy fats, eating more healthy fats, and making other changes in your diet. Eat a balanced diet. This includes fruits and vegetables, low-fat or nonfat dairy, lean protein, nuts and legumes, whole grains, and heart-healthy oils and fats. This information is not intended to replace advice given to you by your health care provider. Make sure you discuss any questions  you have with your health care provider. Document Revised: 06/19/2021 Document Reviewed: 06/19/2021 Elsevier Patient Education  2024 [employer].

## 2023-02-08 ENCOUNTER — Encounter: Payer: Self-pay | Admitting: Internal Medicine

## 2023-02-16 ENCOUNTER — Other Ambulatory Visit: Payer: Self-pay | Admitting: Internal Medicine

## 2023-02-16 DIAGNOSIS — E785 Hyperlipidemia, unspecified: Secondary | ICD-10-CM

## 2023-02-16 DIAGNOSIS — D510 Vitamin B12 deficiency anemia due to intrinsic factor deficiency: Secondary | ICD-10-CM

## 2023-02-16 DIAGNOSIS — N1831 Chronic kidney disease, stage 3a: Secondary | ICD-10-CM

## 2023-02-16 DIAGNOSIS — N3281 Overactive bladder: Secondary | ICD-10-CM

## 2023-02-16 DIAGNOSIS — E118 Type 2 diabetes mellitus with unspecified complications: Secondary | ICD-10-CM

## 2023-02-16 DIAGNOSIS — I10 Essential (primary) hypertension: Secondary | ICD-10-CM

## 2023-02-16 DIAGNOSIS — D539 Nutritional anemia, unspecified: Secondary | ICD-10-CM

## 2023-02-16 NOTE — Progress Notes (Unsigned)
Lab Results  Component Value Date   WBC 5.5 01/26/2023   HGB 10.7 (L) 01/26/2023   HCT 33.6 (L) 01/26/2023   PLT 250 01/26/2023   GLUCOSE 120 (H) 01/29/2023   CHOL 140 02/06/2022   TRIG 123.0 02/06/2022   HDL 47.90 02/06/2022   LDLDIRECT 162.5 01/30/2013   LDLCALC 68 02/06/2022   ALT 22 01/27/2023   AST 22 01/27/2023   NA 140 01/29/2023   K 3.6 01/29/2023   CL 101 01/29/2023   CREATININE 1.77 (H) 01/29/2023   BUN 33 (H) 01/29/2023   CO2 25 01/29/2023   TSH 2.984 01/27/2023   INR 0.9 08/19/2018   HGBA1C 5.9 10/16/2022   MICROALBUR 1.7 02/06/2022

## 2023-02-18 ENCOUNTER — Other Ambulatory Visit: Payer: Self-pay | Admitting: Internal Medicine

## 2023-02-22 NOTE — Progress Notes (Unsigned)
[name] [address] [place] [address] Phone: [phone] Subjective:   Bruce Donath, am serving as a scribe for Dr. Antoine Primas.  I'm seeing this patient by the request  of:  [name], MD  CC: Bilateral hip pain  [id number]  11/08/2022 With worsening symptoms. We have secondary to the spinal stenosis. This causes of worsening pain and recurrent bursitis. We discussed which activities to do and which ones to avoid. Will continue to work on weight loss. Follow-up with me again in 3 months. Held on any injection on the knees.   Updated 02/26/2023 Emily Wheeler is a 67 y.o. female coming in with complaint of hip pain.  Does have greater trochanteric arthritic changes as well as significant spinal stenosis.  Has responded well though to injections in the side of the hips.  Patient states that she is having pain in both hips over GT and L knee swelling.      Past Medical History:  Diagnosis Date   Anemia    mild   Anxiety    Arthritis    Asthma    Cardiomyopathy (HCC)    CHF (congestive heart failure) (HCC)    Diabetes mellitus without complication (HCC)    Frequency of urination    at night   GERD (gastroesophageal reflux disease)    potassium worsens reflux   Hemorrhoids, external    Hyperlipidemia    Hypertension    Neuromuscular disorder (HCC)    neuropathy   Numbness and tingling of both legs    Rheumatoid arthritis (HCC)    Vertigo    hx of   Past Surgical History:  Procedure Laterality Date   ABDOMINAL HYSTERECTOMY     CARDIAC CATHETERIZATION N/A 10/28/2014   Procedure: Left Heart Cath and Coronary Angiography;  Surgeon: Corky Crafts, MD;  Location: [facility] INVASIVE CV LAB;  Service: Cardiovascular;  Laterality: N/A;   CARPAL TUNNEL RELEASE Left    COLONOSCOPY     pt states 11 yr ago in [place] had a colon with 2 polyps- one cecal polyp per pt. one ? location   COLONOSCOPY W/ POLYPECTOMY     CYST REMOVAL  NECK     HEMORROIDECTOMY     MAXIMUM ACCESS (MAS)POSTERIOR LUMBAR INTERBODY FUSION (PLIF) 3 LEVEL N/A 10/08/2013   Procedure: FOR MAXIMUM ACCESS (MAS) POSTERIOR LUMBAR INTERBODY FUSION (PLIF) 3 LEVEL;  Surgeon: Maeola Harman, MD;  Location: [facility] NEURO ORS;  Service: Neurosurgery;  Laterality: N/A;  L3-4 L4-5 L5-S1 maximum access posterior lumbar interbody fusion with decompression   TUBAL LIGATION     Social History   Socioeconomic History   Marital status: Single    Spouse name: Not on file   Number of children: Not on file   Years of education: Not on file   Highest education level: Not on file  Occupational History   Not on file  Tobacco Use   Smoking status: Former    Passive exposure: Current   Smokeless tobacco: Never  Vaping Use   Vaping status: Never Used  Substance and Sexual Activity   Alcohol use: Not Currently   Drug use: No    Types: Marijuana   Sexual activity: Not Currently  Other Topics Concern   Not on file  Social History Narrative   Not on file   Social Determinants of Health   Financial Resource Strain: Low Risk  (07/31/2022)   Overall Financial Resource Strain (CARDIA)    Difficulty  of Paying Living Expenses: Not hard at all  Food Insecurity: No Food Insecurity (01/28/2023)   Hunger Vital Sign    Worried About Running Out of Food in the Last Year: Never true    Ran Out of Food in the Last Year: Never true  Transportation Needs: No Transportation Needs (01/28/2023)   PRAPARE - Administrator, Civil Service (Medical): No    Lack of Transportation (Non-Medical): No  Physical Activity: Inactive (07/31/2022)   Exercise Vital Sign    Days of Exercise per Week: 0 days    Minutes of Exercise per Session: 0 min  Stress: No Stress Concern Present (07/31/2022)   [name] of Occupational Health - Occupational Stress Questionnaire    Feeling of Stress : Not at all  Social Connections: Moderately Isolated (07/31/2022)   Social Connection and Isolation  Panel [NHANES]    Frequency of Communication with Friends and Family: More than three times a week    Frequency of Social Gatherings with Friends and Family: More than three times a week    Attends Religious Services: More than 4 times per year    Active Member of [employer] or Organizations: No    Attends Banker Meetings: Never    Marital Status: Widowed   Allergies  Allergen Reactions   Lisinopril Cough   Family History  Problem Relation Age of Onset   Early death Father    Heart disease Father    Hypertension Sister    Hypertension Brother    Diabetes Brother    Colon polyps Mother    Hashimoto's thyroiditis Sister    Colon cancer Maternal Grandmother        in her 17s   Stomach cancer Maternal Aunt    Non-Hodgkin's lymphoma Sister    Alcohol abuse Neg Hx    COPD Neg Hx    Depression Neg Hx    Drug abuse Neg Hx    Hearing loss Neg Hx    Hyperlipidemia Neg Hx    Kidney disease Neg Hx    Stroke Neg Hx    Esophageal cancer Neg Hx    Rectal cancer Neg Hx     Current Outpatient Medications (Endocrine & Metabolic):    dapagliflozin propanediol (FARXIGA) 10 MG TABS tablet, Take 1 tablet (10 mg total) by mouth daily before breakfast.   TRULICITY 0.75 MG/0.5ML SOPN, INJECT 0.75MG  SUBCUTANEOUSLY ONCE A WEEK  Current Outpatient Medications (Cardiovascular):    atorvastatin (LIPITOR) 40 MG tablet, Take 1 tablet (40 mg total) by mouth daily.   carvedilol (COREG) 12.5 MG tablet, Take 1 tablet (12.5 mg total) by mouth 2 (two) times daily. TAKE AN EXTRA  1/2 TABLET 6.25 MG AS NEEDED FOR PALPITATIONS   sacubitril-valsartan (ENTRESTO) 97-103 MG, Take 1 tablet by mouth 2 (two) times daily.   spironolactone (ALDACTONE) 25 MG tablet, Take 1/2 (one-half) tablet by mouth once daily  Current Outpatient Medications (Respiratory):    levalbuterol (XOPENEX HFA) 45 MCG/ACT inhaler, Inhale 2 puffs into the lungs every 8 (eight) hours as needed for wheezing.  Current Outpatient  Medications (Analgesics):    allopurinol (ZYLOPRIM) 300 MG tablet, Take 1 tablet by mouth once daily   aspirin EC 81 MG tablet, Take 1 tablet (81 mg total) by mouth daily.   leflunomide (ARAVA) 20 MG tablet, Take 1 tablet by mouth once daily   Current Outpatient Medications (Other):    Accu-Chek FastClix Lancets MISC, USE 1  TO CHECK GLUCOSE THREE TIMES DAILY  Blood Glucose Monitoring Suppl (ACCU-CHEK GUIDE) w/Device KIT, 1 Act by Does not apply route 3 (three) times daily.   calcium carbonate (OS-CAL) 1250 (500 Ca) MG chewable tablet, Chew 1 tablet by mouth daily.   citalopram (CELEXA) 20 MG tablet, Take 1 tablet by mouth once daily   esomeprazole (NEXIUM) 40 MG capsule, Take 1 capsule (40 mg total) by mouth daily.   gabapentin (NEURONTIN) 600 MG tablet, TAKE 1 TABLET BY MOUTH THREE TIMES DAILY   glucose blood (ACCU-CHEK GUIDE) test strip, 1 each by Other route 3 (three) times daily.   Lancets Misc. (ACCU-CHEK FASTCLIX LANCET) KIT, Inject 1 Act into the skin 3 (three) times daily.   Omega-3 Fatty Acids (FISH OIL) 1200 MG CAPS, Take 1,200 mg by mouth daily.    Reviewed prior external information including notes and imaging from  primary care provider As well as notes that were available from care everywhere and other healthcare systems.  Past medical history, social, surgical and family history all reviewed in electronic medical record.  No pertanent information unless stated regarding to the chief complaint.   Review of Systems:  No headache, visual changes, nausea, vomiting, diarrhea, constipation, dizziness, abdominal pain, skin rash, fevers, chills, night sweats, weight loss, swollen lymph nodes, body aches, joint swelling, chest pain, shortness of breath, mood changes. POSITIVE muscle aches  Objective  Blood pressure 100/68, pulse 78, height 5\' 4"  (1.626 m), weight 221 lb (100.2 kg), last menstrual period 05/29/1991, SpO2 96%.   General: No apparent distress alert and oriented x3  mood and affect normal, dressed appropriately.  HEENT: Pupils equal, extraocular movements intact  Respiratory: Patient's speak in full sentences and does not appear short of breath  Antalgic gait noted.  Patient does have severe tenderness over the greater trochanteric area bilaterally.  Significant loss of lordosis of the lumbar spine noted.  Does have some peripheral neuropathy noted. Patient does have arthritic changes of the knees bilaterally.  Trace effusion noted bilaterally.  After informed written and verbal consent, patient was seated on exam table. Right knee was prepped with alcohol swab and utilizing anterolateral approach, patient's right knee space was injected with 4:1  marcaine 0.5%: Kenalog 40mg /dL. Patient tolerated the procedure well without immediate complications.  After informed written and verbal consent, patient was seated on exam table. Left knee was prepped with alcohol swab and utilizing anterolateral approach, patient's left knee space was injected with 4:1  marcaine 0.5%: Kenalog 40mg /dL. Patient tolerated the procedure well without immediate complications.  Procedure: Real-time Ultrasound Guided Injection of right greater trochanteric bursitis secondary to patient's body habitus Device: GE Logiq Q7 Ultrasound guided injection is preferred based studies that show increased duration, increased effect, greater accuracy, decreased procedural pain, increased response rate, and decreased cost with ultrasound guided versus blind injection.  Verbal informed consent obtained.  Time-out conducted.  Noted no overlying erythema, induration, or other signs of local infection.  Skin prepped in a sterile fashion.  Local anesthesia: Topical Ethyl chloride.  With sterile technique and under real time ultrasound guidance:  Greater trochanteric area was visualized and patient's bursa was noted. A 22-gauge 3 inch needle was inserted and 4 cc of 0.5% Marcaine and 1 cc of Kenalog 40 mg/dL  was injected. Pictures taken Completed without difficulty  Pain immediately resolved suggesting accurate placement of the medication.  Advised to call if fevers/chills, erythema, induration, drainage, or persistent bleeding.  Impression: Technically successful ultrasound guided injection.   Procedure: Real-time Ultrasound Guided Injection of left  greater trochanteric bursitis secondary to patient's body habitus Device: GE Logiq Q7  Ultrasound guided injection is preferred based studies that show increased duration, increased effect, greater accuracy, decreased procedural pain, increased response rate, and decreased cost with ultrasound guided versus blind injection.  Verbal informed consent obtained.  Time-out conducted.  Noted no overlying erythema, induration, or other signs of local infection.  Skin prepped in a sterile fashion.  Local anesthesia: Topical Ethyl chloride.  With sterile technique and under real time ultrasound guidance:  Greater trochanteric area was visualized and patient's bursa was noted. A 22-gauge 3 inch needle was inserted and 4 cc of 0.5% Marcaine and 1 cc of Kenalog 40 mg/dL was injected. Pictures taken Completed without difficulty  Pain immediately resolved suggesting accurate placement of the medication.  Advised to call if fevers/chills, erythema, induration, drainage, or persistent bleeding.  Impression: Technically successful ultrasound guided injection.    Impression and Recommendations:     The above documentation has been reviewed and is accurate and complete Judi Saa, DO

## 2023-02-26 ENCOUNTER — Ambulatory Visit (INDEPENDENT_AMBULATORY_CARE_PROVIDER_SITE_OTHER): Payer: 59 | Admitting: Family Medicine

## 2023-02-26 ENCOUNTER — Other Ambulatory Visit: Payer: 59

## 2023-02-26 ENCOUNTER — Ambulatory Visit: Payer: Self-pay

## 2023-02-26 VITALS — BP 100/68 | HR 78 | Ht 64.0 in | Wt 221.0 lb

## 2023-02-26 DIAGNOSIS — N1831 Chronic kidney disease, stage 3a: Secondary | ICD-10-CM

## 2023-02-26 DIAGNOSIS — E118 Type 2 diabetes mellitus with unspecified complications: Secondary | ICD-10-CM

## 2023-02-26 DIAGNOSIS — M7061 Trochanteric bursitis, right hip: Secondary | ICD-10-CM

## 2023-02-26 DIAGNOSIS — I10 Essential (primary) hypertension: Secondary | ICD-10-CM

## 2023-02-26 DIAGNOSIS — M25552 Pain in left hip: Secondary | ICD-10-CM | POA: Diagnosis not present

## 2023-02-26 DIAGNOSIS — M25551 Pain in right hip: Secondary | ICD-10-CM

## 2023-02-26 DIAGNOSIS — E785 Hyperlipidemia, unspecified: Secondary | ICD-10-CM | POA: Diagnosis not present

## 2023-02-26 DIAGNOSIS — D539 Nutritional anemia, unspecified: Secondary | ICD-10-CM | POA: Diagnosis not present

## 2023-02-26 DIAGNOSIS — M2550 Pain in unspecified joint: Secondary | ICD-10-CM

## 2023-02-26 DIAGNOSIS — M170 Bilateral primary osteoarthritis of knee: Secondary | ICD-10-CM | POA: Diagnosis not present

## 2023-02-26 DIAGNOSIS — M7062 Trochanteric bursitis, left hip: Secondary | ICD-10-CM

## 2023-02-26 LAB — LIPID PANEL
Cholesterol: 153 mg/dL (ref 0–200)
HDL: 44.8 mg/dL (ref >39.00)
LDL Cholesterol: 76 mg/dL (ref 0–99)
NonHDL: 108.48
Total CHOL/HDL Ratio: 3
Triglycerides: 164 mg/dL — ABNORMAL HIGH (ref 0.0–149.0)
VLDL: 32.8 mg/dL (ref 0.0–40.0)

## 2023-02-26 LAB — BASIC METABOLIC PANEL
BUN: 30 mg/dL — ABNORMAL HIGH (ref 6–23)
CO2: 25 meq/L (ref 19–32)
Calcium: 9.7 mg/dL (ref 8.4–10.5)
Chloride: 105 meq/L (ref 96–112)
Creatinine, Ser: 1.75 mg/dL — ABNORMAL HIGH (ref 0.40–1.20)
GFR: 29.48 mL/min — ABNORMAL LOW (ref >60.00)
Glucose, Bld: 102 mg/dL — ABNORMAL HIGH (ref 70–99)
Potassium: 5.6 meq/L — ABNORMAL HIGH (ref 3.5–5.1)
Sodium: 138 meq/L (ref 135–145)

## 2023-02-26 LAB — IBC PANEL
Iron: 78 ug/dL (ref 42–145)
Saturation Ratios: 23.4 % (ref 20.0–50.0)
TIBC: 333.2 ug/dL (ref 250.0–450.0)
Transferrin: 238 mg/dL (ref 212.0–360.0)

## 2023-02-26 LAB — FERRITIN: Ferritin: 61.7 ng/mL (ref 10.0–291.0)

## 2023-02-26 LAB — VITAMIN D 25 HYDROXY (VIT D DEFICIENCY, FRACTURES): VITD: 60.01 ng/mL (ref 30.00–100.00)

## 2023-02-26 LAB — VITAMIN B12: Vitamin B-12: 1213 pg/mL — ABNORMAL HIGH (ref 211–911)

## 2023-02-26 LAB — URIC ACID: Uric Acid, Serum: 3.3 mg/dL (ref 2.4–7.0)

## 2023-02-26 LAB — FOLATE: Folate: 17.6 ng/mL (ref >5.9)

## 2023-02-26 NOTE — Patient Instructions (Addendum)
Injections today Labs today See me again in 3 months Please go in to ER if you feel worse

## 2023-02-26 NOTE — Assessment & Plan Note (Signed)
Bilateral injections.  Discussed HEP  Discussed which activities to do  RTC in 12 weeks

## 2023-02-26 NOTE — Assessment & Plan Note (Signed)
Patient given bilateral injections today.  Can repeat every 3 to 4 months if needed.  Still concerned that most of it is coming from her back.  Discussed icing regimen and home exercises, discussed avoiding certain activities.  Increase activity as tolerated.  No change in medications but does need the further evaluation for her heart failure and kidney dysfunction.  Added other labs to see if anything else is potentially contributing.

## 2023-02-27 LAB — PTH, INTACT AND CALCIUM
Calcium: 9.5 mg/dL (ref 8.6–10.4)
PTH: 145 pg/mL — ABNORMAL HIGH (ref 16–77)

## 2023-03-01 ENCOUNTER — Other Ambulatory Visit: Payer: Self-pay | Admitting: Family Medicine

## 2023-03-01 DIAGNOSIS — N289 Disorder of kidney and ureter, unspecified: Secondary | ICD-10-CM

## 2023-03-05 ENCOUNTER — Other Ambulatory Visit: Payer: Self-pay | Admitting: Internal Medicine

## 2023-03-05 DIAGNOSIS — M069 Rheumatoid arthritis, unspecified: Secondary | ICD-10-CM

## 2023-03-05 DIAGNOSIS — E118 Type 2 diabetes mellitus with unspecified complications: Secondary | ICD-10-CM

## 2023-03-07 ENCOUNTER — Other Ambulatory Visit: Payer: Self-pay | Admitting: Internal Medicine

## 2023-03-07 ENCOUNTER — Ambulatory Visit (INDEPENDENT_AMBULATORY_CARE_PROVIDER_SITE_OTHER): Payer: 59 | Admitting: Internal Medicine

## 2023-03-07 ENCOUNTER — Encounter: Payer: Self-pay | Admitting: Internal Medicine

## 2023-03-07 VITALS — BP 114/62 | HR 93 | Temp 98.1°F | Ht 64.0 in | Wt 221.0 lb

## 2023-03-07 DIAGNOSIS — I5020 Unspecified systolic (congestive) heart failure: Secondary | ICD-10-CM | POA: Diagnosis not present

## 2023-03-07 DIAGNOSIS — E118 Type 2 diabetes mellitus with unspecified complications: Secondary | ICD-10-CM | POA: Diagnosis not present

## 2023-03-07 DIAGNOSIS — N1831 Chronic kidney disease, stage 3a: Secondary | ICD-10-CM | POA: Diagnosis not present

## 2023-03-07 DIAGNOSIS — D510 Vitamin B12 deficiency anemia due to intrinsic factor deficiency: Secondary | ICD-10-CM

## 2023-03-07 DIAGNOSIS — M069 Rheumatoid arthritis, unspecified: Secondary | ICD-10-CM

## 2023-03-07 DIAGNOSIS — I10 Essential (primary) hypertension: Secondary | ICD-10-CM

## 2023-03-07 DIAGNOSIS — E875 Hyperkalemia: Secondary | ICD-10-CM | POA: Diagnosis not present

## 2023-03-07 DIAGNOSIS — I255 Ischemic cardiomyopathy: Secondary | ICD-10-CM

## 2023-03-07 LAB — BASIC METABOLIC PANEL
BUN: 30 mg/dL — ABNORMAL HIGH (ref 6–23)
CO2: 27 meq/L (ref 19–32)
Calcium: 9.3 mg/dL (ref 8.4–10.5)
Chloride: 106 meq/L (ref 96–112)
Creatinine, Ser: 1.47 mg/dL — ABNORMAL HIGH (ref 0.40–1.20)
GFR: 36.33 mL/min — ABNORMAL LOW (ref >60.00)
Glucose, Bld: 107 mg/dL — ABNORMAL HIGH (ref 70–99)
Potassium: 4.6 meq/L (ref 3.5–5.1)
Sodium: 139 meq/L (ref 135–145)

## 2023-03-07 LAB — MICROALBUMIN / CREATININE URINE RATIO
Creatinine,U: 154.9 mg/dL
Microalb Creat Ratio: 0.9 mg/g (ref 0.0–30.0)
Microalb, Ur: 1.4 mg/dL (ref 0.0–1.9)

## 2023-03-07 LAB — HEMOGLOBIN A1C: Hgb A1c MFr Bld: 6.1 % (ref 4.6–6.5)

## 2023-03-07 NOTE — Patient Instructions (Signed)
Hyperkalemia Hyperkalemia occurs when the level of potassium in the blood is too high. Potassium is an important mineral (electrolyte) that helps the muscles and nerves function normally. It affects how the heart works, and it helps keep fluids and minerals balanced in the body. If there is too much potassium in your blood, it can affect your heart's ability to function normally. Potassium is normally removed (excreted) from the body by the kidneys. Hyperkalemia can result from various conditions. It can range from mild to severe. What are the causes? This condition may be caused by: Taking in too much potassium. This can happen if: You use salt substitutes. They contain large amounts of potassium. You take potassium supplements. You eat too many foods that are high in potassium if you have kidney disease. Excreting too little potassium. This can happen if: Your kidneys are not working properly. Kidney (renal) disease, including short-term or long-term renal failure, is a common cause of hyperkalemia. You are taking medicines that lower your excretion of potassium, such as ACE inhibitors, angiotensin II receptor blockers (ARBs), or potassium-sparing diuretics, such as spironolactone. You have Addison's disease. You have a urinary tract blockage, such as kidney stones. You are on treatment to mechanically clean your blood (dialysis) and you skip a treatment. Your cells releasing a high amount of potassium into the blood. This can happen with: Injury to muscles (rhabdomyolysis) or other tissues. Most potassium is stored in your muscles. Severe burns, injuries, or infections. Acidic blood plasma (acidosis). Acidosis can result from many diseases, such as uncontrolled diabetes. What increases the risk? You are more likely to develop this condition if you have alcoholism or if you use drugs heavily. What are the signs or symptoms? In many cases, there are no symptoms. However, when your potassium  level becomes high enough, you may have symptoms such as: An irregular or very slow heartbeat. Nausea. Tiredness (fatigue). Confusion. Tingling of your skin or numbness of your hands or feet. Muscle cramps. Muscle weakness. Not being able to move (paralysis). How is this diagnosed? This condition may be diagnosed based on: Your symptoms and medical history. Your health care provider will ask about your use of over-the-counter and prescription medicines. A physical exam. Blood tests. An electrocardiogram (ECG). How is this treated? Treatment depends on the cause and severity of your condition. Treatment may need to be done in the hospital setting. Treatment may include: Receiving a sugar solution (glucose) through an IV along with insulin to shift potassium out of your blood and into your cells. Taking a medicine called albuterol to shift potassium out of your blood and into your cells. Taking medicines to remove the potassium from your body. Having dialysis to remove the potassium from your body. Taking calcium to protect your heart from the effects of high potassium, such as irregular rhythms (arrhythmias). Follow these instructions at home:  Take over-the-counter and prescription medicines only as told by your health care provider. Do not take any supplements, natural food products, herbs, or vitamins without reviewing them with your health care provider. Certain supplements and natural food products contain high amounts of potassium. If you drink alcohol, limit how much you have as told by your health care provider. Do not use illegal drugs. If you need help quitting, ask your health care provider. If you have kidney disease, you may need to follow a low-potassium diet. A dietitian can help you learn which foods have high or low amounts of potassium. Keep all follow-up visits. This is important.  Contact a health care provider if: You have an irregular or very slow heartbeat. You  feel light-headed. You feel weak. You are nauseous. You have tingling or numbness in your hands or feet. Get help right away if: You have shortness of breath. You have chest pain or discomfort. You faint. You have muscle paralysis. These symptoms may be an emergency. Get help right away. Call 911. Do not wait to see if the symptoms will go away. Do not drive yourself to the hospital. Summary Hyperkalemia occurs when the level of potassium in your blood is too high. This condition may be caused by taking in too much potassium, excreting too little potassium, or releasing a high amount of potassium from your cells into your blood. Hyperkalemia can result from many underlying conditions, especially chronic kidney disease, or from taking certain medicines. Treatment of hyperkalemia may include medicine to shift potassium out of your blood and into your cells or to remove the potassium from your body. If you have kidney disease, you may need to follow a low-potassium diet. A dietitian can help you learn which foods have high or low amounts of potassium. This information is not intended to replace advice given to you by your health care provider. Make sure you discuss any questions you have with your health care provider. Document Revised: 01/26/2021 Document Reviewed: 01/26/2021 Elsevier Patient Education  2024 [employer].

## 2023-03-07 NOTE — Progress Notes (Signed)
Subjective:  Patient ID: Emily Wheeler, female    DOB: [birth year]  Age: 67 y.o. MRN: [id number]  CC: Hypertension, Diabetes, and Congestive Heart Failure   HPI Jai I Prior presents for f/up ---  Discussed the use of AI scribe software for clinical note transcription with the patient, who gave verbal consent to proceed.  History of Present Illness   The patient, with a history of heart failure, kidney disease, and hypertension, was brought in due to elevated potassium levels. They had stopped taking spironolactone, a medication known to raise potassium levels, prior to the consultation. The patient denied experiencing muscle cramps or twitches. They reported a recent hospitalization due to fluid build-up related to heart failure, which has since improved. However, they continue to experience shortness of breath.  The patient also reported occasional lightheadedness, which may be related to their low blood pressure. They have been monitoring their blood pressure three times a day as advised during their recent hospital stay. They also mentioned a change in diet but did not provide specifics.  The patient has been taking a salt substitute containing potassium and was unsure if this could be contributing to their high potassium levels. They also take Entresto and Farxiga, but have stopped taking Lasix after being informed that it may have worsened their kidney function during their hospital stay.  They denied experiencing nausea, vomiting, diarrhea, or leg swelling at the time of the consultation.       Outpatient Medications Prior to Visit  Medication Sig Dispense Refill   Accu-Chek FastClix Lancets MISC USE 1  TO CHECK GLUCOSE THREE TIMES DAILY 306 each 0   allopurinol (ZYLOPRIM) 300 MG tablet Take 1 tablet by mouth once daily 90 tablet 0   aspirin EC 81 MG tablet Take 1 tablet (81 mg total) by mouth daily. 90 tablet 1   atorvastatin (LIPITOR) 40 MG tablet Take 1 tablet (40 mg  total) by mouth daily. 90 tablet 1   Blood Glucose Monitoring Suppl (ACCU-CHEK GUIDE) w/Device KIT 1 Act by Does not apply route 3 (three) times daily. 2 kit 2   calcium carbonate (OS-CAL) 1250 (500 Ca) MG chewable tablet Chew 1 tablet by mouth daily.     carvedilol (COREG) 12.5 MG tablet Take 1 tablet (12.5 mg total) by mouth 2 (two) times daily. TAKE AN EXTRA  1/2 TABLET 6.25 MG AS NEEDED FOR PALPITATIONS 180 tablet 2   citalopram (CELEXA) 20 MG tablet Take 1 tablet by mouth once daily 90 tablet 0   dapagliflozin propanediol (FARXIGA) 10 MG TABS tablet Take 1 tablet (10 mg total) by mouth daily before breakfast. 90 tablet 1   esomeprazole (NEXIUM) 40 MG capsule Take 1 capsule (40 mg total) by mouth daily. 90 capsule 1   gabapentin (NEURONTIN) 600 MG tablet TAKE 1 TABLET BY MOUTH THREE TIMES DAILY 270 tablet 0   glucose blood (ACCU-CHEK GUIDE) test strip 1 each by Other route 3 (three) times daily. 300 each 1   Lancets Misc. (ACCU-CHEK FASTCLIX LANCET) KIT Inject 1 Act into the skin 3 (three) times daily. 306 kit 1   levalbuterol (XOPENEX HFA) 45 MCG/ACT inhaler Inhale 2 puffs into the lungs every 8 (eight) hours as needed for wheezing. 3 each 3   Omega-3 Fatty Acids (FISH OIL) 1200 MG CAPS Take 1,200 mg by mouth daily.      TRULICITY 0.75 MG/0.5ML SOPN INJECT 0.75MG  SUBCUTANEOUSLY ONCE A WEEK 16 mL 0   leflunomide (ARAVA) 20 MG tablet Take 1  tablet by mouth once daily 90 tablet 0   sacubitril-valsartan (ENTRESTO) 97-103 MG Take 1 tablet by mouth 2 (two) times daily. 180 tablet 2   spironolactone (ALDACTONE) 25 MG tablet Take 1/2 (one-half) tablet by mouth once daily 15 tablet 5   No facility-administered medications prior to visit.    ROS Review of Systems  Objective:  BP 114/62 (BP Location: Right Arm, Patient Position: Sitting, Cuff Size: Large)   Pulse 93   Temp 98.1 F (36.7 C) (Oral)   Ht 5\' 4"  (1.626 m)   Wt 221 lb (100.2 kg)   LMP 05/29/1991   SpO2 95%   BMI 37.93 kg/m    BP Readings from Last 3 Encounters:  03/07/23 114/62  02/26/23 100/68  01/31/23 120/66    Wt Readings from Last 3 Encounters:  03/07/23 221 lb (100.2 kg)  02/26/23 221 lb (100.2 kg)  01/31/23 223 lb (101.2 kg)    Physical Exam Cardiovascular:     Rate and Rhythm: Normal rate. Occasional Extrasystoles are present.    Heart sounds: Normal heart sounds, S1 normal and S2 normal.     Comments: EKG- SR with occasional PVC, 74 bpm LAD Anterior infarct pattern is old No peaked T waves  Musculoskeletal:     Right lower leg: No edema.     Left lower leg: No edema.     Lab Results  Component Value Date   WBC 5.5 01/26/2023   HGB 10.7 (L) 01/26/2023   HCT 33.6 (L) 01/26/2023   PLT 250 01/26/2023   GLUCOSE 107 (H) 03/07/2023   CHOL 153 02/26/2023   TRIG 164.0 (H) 02/26/2023   HDL 44.80 02/26/2023   LDLDIRECT 162.5 01/30/2013   LDLCALC 76 02/26/2023   ALT 22 01/27/2023   AST 22 01/27/2023   NA 139 03/07/2023   K 4.6 03/07/2023   CL 106 03/07/2023   CREATININE 1.47 (H) 03/07/2023   BUN 30 (H) 03/07/2023   CO2 27 03/07/2023   TSH 2.984 01/27/2023   INR 0.9 08/19/2018   HGBA1C 6.1 03/07/2023   MICROALBUR 1.4 03/07/2023    CT Angio Chest PE W and/or Wo Contrast  Result Date: 01/27/2023 CLINICAL DATA:  Pulmonary embolism suspected, low to intermediate probability, positive D-dimer. Shortness of breath for 2 weeks and tingling in heart. Swelling in feet and ankles. EXAM: CT ANGIOGRAPHY CHEST WITH CONTRAST TECHNIQUE: Multidetector CT imaging of the chest was performed using the standard protocol during bolus administration of intravenous contrast. Multiplanar CT image reconstructions and MIPs were obtained to evaluate the vascular anatomy. RADIATION DOSE REDUCTION: This exam was performed according to the departmental dose-optimization program which includes automated exposure control, adjustment of the mA and/or kV according to patient size and/or use of iterative reconstruction  technique. CONTRAST:  75mL OMNIPAQUE IOHEXOL 350 MG/ML SOLN COMPARISON:  None Available. FINDINGS: Cardiovascular: The heart is enlarged and there is a trace pericardial effusion. The aorta and pulmonary trunk are normal in caliber. No pulmonary embolism is seen. Mediastinum/Nodes: No enlarged mediastinal, hilar, or axillary lymph nodes. Thyroid gland, trachea, and esophagus demonstrate no significant findings. Lungs/Pleura: Lungs are clear. No pleural effusion or pneumothorax. Upper Abdomen: No acute abnormality. Musculoskeletal: Degenerative changes are present in the thoracic spine. No acute osseous abnormality. Review of the MIP images confirms the above findings. IMPRESSION: No evidence of pulmonary embolism or other acute process. Electronically Signed   By: Thornell Sartorius M.D.   On: 01/27/2023 05:03   DG Chest 2 View  Result Date: 01/27/2023  CLINICAL DATA:  Shortness of breath EXAM: CHEST - 2 VIEW COMPARISON:  07/09/2014 FINDINGS: The heart size and mediastinal contours are within normal limits. Both lungs are clear. The visualized skeletal structures are unremarkable. IMPRESSION: No active cardiopulmonary disease. Electronically Signed   By: Charlett Nose M.D.   On: 01/27/2023 00:01    Assessment & Plan:  Hyperkalemia - K+ is down to 4.6. Will continue to hold spironolactone and will decrease the ARB dose. EKG does not show any concern for elevated K+. -     Basic metabolic panel; Future  Essential hypertension, benign - Her BP is over-controlled and she is symptomatic. -     Basic metabolic panel; Future -     Urinalysis, Routine w reflex microscopic; Future -     EKG 12-Lead  Type II diabetes mellitus with manifestations (HCC) -     Basic metabolic panel; Future -     Hemoglobin A1c; Future -     Microalbumin / creatinine urine ratio; Future -     Urinalysis, Routine w reflex microscopic; Future -     HM Diabetes Foot Exam  Stage 3a chronic kidney disease (HCC) -     Basic metabolic  panel; Future -     Microalbumin / creatinine urine ratio; Future -     Urinalysis, Routine w reflex microscopic; Future -     Ambulatory referral to Nephrology -     AMB Referral to Pharmacy Medication Management  Heart failure with reduced ejection fraction (HCC) -     Entresto; Take 1 tablet by mouth 2 (two) times daily.  Dispense: 180 tablet; Refill: 0 -     AMB Referral to Pharmacy Medication Management     Follow-up: Return in about 3 months (around 06/07/2023).  Sanda Linger, MD

## 2023-03-08 ENCOUNTER — Encounter: Payer: Self-pay | Admitting: Internal Medicine

## 2023-03-08 LAB — URINALYSIS, ROUTINE W REFLEX MICROSCOPIC
Bilirubin Urine: NEGATIVE
Hgb urine dipstick: NEGATIVE
Ketones, ur: NEGATIVE
Leukocytes,Ua: NEGATIVE
Nitrite: NEGATIVE
RBC / HPF: NONE SEEN (ref 0–?)
Specific Gravity, Urine: 1.02 (ref 1.000–1.030)
Total Protein, Urine: NEGATIVE
Urine Glucose: >=1000 — AB
Urobilinogen, UA: 0.2 (ref 0.0–1.0)
pH: 5.5 (ref 5.0–8.0)

## 2023-03-08 MED ORDER — ENTRESTO 49-51 MG PO TABS: 1.0000 | ORAL_TABLET | Freq: Two times a day (BID) | ORAL | 0 refills | Status: DC

## 2023-03-08 MED ORDER — ENTRESTO 49-51 MG PO TABS
1.0000 | ORAL_TABLET | Freq: Two times a day (BID) | ORAL | 0 refills | Status: DC
Start: 2023-03-08 — End: 2023-04-29

## 2023-03-11 ENCOUNTER — Telehealth: Payer: Self-pay

## 2023-03-11 ENCOUNTER — Ambulatory Visit: Payer: 59 | Admitting: Internal Medicine

## 2023-03-11 NOTE — Progress Notes (Signed)
Care Guide Note  03/11/2023 Name: AMARIE GAISER MRN: [id number] DOB: Aug 09, 1955  Referred by: [name], MD Reason for referral : Care Coordination (Outreach to schedule with Pharm d )   Lucine I Fornal is a 67 y.o. year old female who is a primary care patient of Yetta Barre, Bernadene Bell, MD. [name] was referred to the pharmacist for assistance related to CHF.    Successful contact was made with the patient to discuss pharmacy services including being ready for the pharmacist to call at least 5 minutes before the scheduled appointment time, to have medication bottles and any blood sugar or blood pressure readings ready for review. The patient agreed to meet with the pharmacist via with the pharmacist via telephone visit on (date/time).  03/13/2023  Penne Lash, RMA Care Guide [facility]  [place], [place] [address] Direct Dial: [phone] [email or web address]

## 2023-03-13 ENCOUNTER — Other Ambulatory Visit: Payer: 59 | Admitting: Pharmacist

## 2023-03-13 ENCOUNTER — Inpatient Hospital Stay: Payer: 59 | Admitting: Internal Medicine

## 2023-03-13 DIAGNOSIS — E785 Hyperlipidemia, unspecified: Secondary | ICD-10-CM

## 2023-03-13 DIAGNOSIS — I5020 Unspecified systolic (congestive) heart failure: Secondary | ICD-10-CM

## 2023-03-13 NOTE — Progress Notes (Signed)
03/13/2023 Name: Emily Wheeler MRN: [id number] DOB: 04/07/1956  Chief Complaint  Patient presents with   Medication Management    Emily Wheeler is a 67 y.o. year old female who presented for a telephone visit.   They were referred to the pharmacist by their PCP for assistance in managing  CKD or HFrEF .   Subjective:  Care Team: Primary Care Provider: [name], MD ; Next Scheduled Visit: not scheduled Cardiologist: Dr. Eden Emms; Next Scheduled Visit: 06/03/2023  Medication Access/Adherence  Current Pharmacy:  [id number] [address]), [place] - [address] [place] ([place]) [place] [address] Phone: [phone] Fax: [phone]  [facility] [place] [address] [place] - [address] [place] [address] Phone: [phone] Fax: [phone]   Patient reports affordability concerns with their medications: No  Patient reports access/transportation concerns to their pharmacy: No  Patient reports adherence concerns with their medications:  No     Hyperlipidemia/ASCVD Risk Reduction  Current lipid lowering medications: atorvastatin 40 mg daily Medications tried in the past:   Antiplatelet regimen: aspirin 81 mg daily for PAD  The 10-year ASCVD risk score (Arnett DK, et al., 2019) is: 16.1%   Values used to calculate the score:     Age: 4 years     Sex: Female     Is Non-Hispanic African American: Yes     Diabetic: Yes     Tobacco smoker: No     Systolic Blood Pressure: 114 mmHg     Is BP treated: Yes     HDL Cholesterol: 44.8 mg/dL     Total Cholesterol: 153 mg/dL    Heart Failure:  Current medications:  ACEi/ARB/ARNI: Entresto 97/103 mg twice daily (PCP decreased to 49/51 mg, pt has not started yet) SGLT2i: Farxiga 10 mg daily Beta blocker: carvedilol 12.5 mg twice daily Mineralocorticoid Receptor Antagonist: none (spironolactone just d/c due to hyperkalemia and reduced renal  function) Diuretic regimen: none (furosemide d/c due to renal function decline)  Current home blood pressure readings: 107/69 today Current home weights: weight daily, staying stable  Patient denies volume overload signs or symptoms including shortness of breath, lower extremity edema, increased use of pillows at night Patients denies s/sx of low blood pressure at this time  Current medication access support: pt has dual Medicare/Medicaid - no issues getting medications   Objective:  Lab Results  Component Value Date   HGBA1C 6.1 03/07/2023    Lab Results  Component Value Date   CREATININE 1.47 (H) 03/07/2023   BUN 30 (H) 03/07/2023   NA 139 03/07/2023   K 4.6 03/07/2023   CL 106 03/07/2023   CO2 27 03/07/2023    Lab Results  Component Value Date   CHOL 153 02/26/2023   HDL 44.80 02/26/2023   LDLCALC 76 02/26/2023   LDLDIRECT 162.5 01/30/2013   TRIG 164.0 (H) 02/26/2023   CHOLHDL 3 02/26/2023    Medications Reviewed Today     Reviewed by Bonita Quin, RPH (Pharmacist) on 03/13/23 at 1314  Med List Status: <None>   Medication Order Taking? Sig Documenting Provider Last Dose Status Informant  Accu-Chek FastClix Lancets MISC [id number]  USE 1  TO CHECK GLUCOSE THREE TIMES DAILY [name], MD  Active Self  allopurinol (ZYLOPRIM) 300 MG tablet [id number] Yes Take 1 tablet by mouth once daily Judi Saa, DO Taking Active Self  aspirin EC 81 MG tablet [id number] Yes Take  1 tablet (81 mg total) by mouth daily. [name], MD Taking Active Self  atorvastatin (LIPITOR) 40 MG tablet [id number] Yes Take 1 tablet (40 mg total) by mouth daily. [name], MD Taking Active Self  Blood Glucose Monitoring Suppl (ACCU-CHEK GUIDE) w/Device KIT [id number]  1 Act by Does not apply route 3 (three) times daily. [name], MD  Active Self  calcium carbonate (OS-CAL) 1250 (500 Ca) MG chewable tablet [id number]  Chew 1 tablet by mouth daily. [provider]  Active Self  carvedilol (COREG) 12.5 MG tablet [id number] Yes Take 1 tablet (12.5 mg total) by mouth 2 (two) times daily. TAKE AN EXTRA  1/2 TABLET 6.25 MG AS NEEDED FOR PALPITATIONS Asa Lente, Tessa N, PA-C Taking Active   citalopram (CELEXA) 20 MG tablet [id number] Yes Take 1 tablet by mouth once daily [name], MD Taking Active Self  dapagliflozin propanediol (FARXIGA) 10 MG TABS tablet [id number] Yes Take 1 tablet (10 mg total) by mouth daily before breakfast. [name], MD Taking Active Self  esomeprazole (NEXIUM) 40 MG capsule [id number] Yes Take 1 capsule (40 mg total) by mouth daily. [name], MD Taking Active Self  gabapentin (NEURONTIN) 600 MG tablet [id number] Yes TAKE 1 TABLET BY MOUTH THREE TIMES DAILY [name], MD Taking Active   glucose blood (ACCU-CHEK GUIDE) test strip [id number]  1 each by Other route 3 (three) times daily. [name], MD  Active Self  Lancets Misc. (ACCU-CHEK FASTCLIX LANCET) KIT [id number]  Inject 1 Act into the skin 3 (three) times daily. [name], MD  Active Self  leflunomide (ARAVA) 20 MG tablet [id number] Yes Take 1 tablet by mouth once daily [name], MD Taking Active   levalbuterol [facility]) 45 MCG/ACT inhaler [id number]  Inhale 2 puffs into the lungs every 8 (eight) hours as needed for wheezing. [name], MD  Active Self  Omega-3 Fatty Acids (FISH OIL) 1200 MG CAPS [id number] Yes Take 1,200 mg by mouth daily.  [provider] Taking Active Self  sacubitril-valsartan (ENTRESTO) 49-51 MG [id number] Yes Take 1 tablet by mouth 2 (two) times daily. [name], MD Taking Active   TRULICITY 0.75 MG/0.5ML SOPN [id number] Yes INJECT 0.75MG  SUBCUTANEOUSLY ONCE A WEEK [name], MD Taking Active Self           Med Note [facility], [place] Jan 27, 2023 12:13 AM) Friday               Assessment/Plan:   Hyperlipidemia/ASCVD Risk Reduction: - Currently uncontrolled. LDL goal <55 due to PAD +  T2DM - Reviewed long term complications of uncontrolled cholesterol - Reviewed dietary recommendations including increasing fiber, sweets/high fat foods in moderation - Recommend to increase atorvastatin to 80 mg if LDL remains >55 on next lipid panel    Heart Failure: - Currently appropriately managed - Reviewed appropriate blood pressure monitoring technique and reviewed goal blood pressure - Reviewed to weigh daily and when to contact cardiology with weight gain - Reviewed dietary modifications including avoiding salt substitutes (due to potassium) - Recommend to start reduced dose of Entresto and monitor BP  Follow Up Plan: 11/13 telephone follow up  Arbutus Leas, PharmD, BCPS [facility] [phone]

## 2023-03-13 NOTE — Patient Instructions (Signed)
It was a pleasure speaking with you today!  Continue weighing daily and monitoring for signs of increased fluid. Call your cardiologist if you gain more than 3 pounds in one day or 5 pounds in one week.  Start the reduced dose of Entresto and monitor for blood pressures. The goal is for the blood pressure to stay under 130/80.  Feel free to call with any questions or concerns!  Arbutus Leas, PharmD, BCPS [facility] [phone]

## 2023-03-20 ENCOUNTER — Other Ambulatory Visit: Payer: Self-pay | Admitting: Family Medicine

## 2023-03-21 MED ORDER — ALLOPURINOL 300 MG PO TABS: 300.0000 mg | ORAL_TABLET | Freq: Every day | ORAL | 0 refills | Status: DC

## 2023-03-31 ENCOUNTER — Other Ambulatory Visit: Payer: Self-pay | Admitting: Internal Medicine

## 2023-04-02 ENCOUNTER — Other Ambulatory Visit: Payer: Self-pay | Admitting: Internal Medicine

## 2023-04-02 DIAGNOSIS — E118 Type 2 diabetes mellitus with unspecified complications: Secondary | ICD-10-CM

## 2023-04-03 ENCOUNTER — Other Ambulatory Visit: Payer: Self-pay | Admitting: Family Medicine

## 2023-04-09 ENCOUNTER — Ambulatory Visit: Payer: 59 | Admitting: Internal Medicine

## 2023-04-10 ENCOUNTER — Other Ambulatory Visit: Payer: 59

## 2023-04-11 ENCOUNTER — Other Ambulatory Visit: Payer: 59 | Admitting: Pharmacist

## 2023-04-11 DIAGNOSIS — E785 Hyperlipidemia, unspecified: Secondary | ICD-10-CM

## 2023-04-11 DIAGNOSIS — I5020 Unspecified systolic (congestive) heart failure: Secondary | ICD-10-CM

## 2023-04-11 NOTE — Progress Notes (Signed)
04/11/2023 Name: Emily Wheeler MRN: [id number] DOB: 1955-07-06  Chief Complaint  Patient presents with   Medication Management    [name] is a 67 y.o. year old female who presented for a telephone visit.   They were referred to the pharmacist by their PCP for assistance in managing  CKD or HFrEF .   Subjective:  Care Team: Primary Care Provider: [name], MD ; Next Scheduled Visit: not scheduled Cardiologist: Dr. Eden Emms; Next Scheduled Visit: 06/03/2023  Medication Access/Adherence  Current Pharmacy:  [id number] ([address]), [place] - [address] [place] ([place]) [place] [address] Phone: [phone] Fax: [phone]  [facility] [address], [place] - [address] [place] [address] Phone: [phone] Fax: [phone]   Patient reports affordability concerns with their medications: No  Patient reports access/transportation concerns to their pharmacy: No  Patient reports adherence concerns with their medications:  No     Hyperlipidemia/ASCVD Risk Reduction  Current lipid lowering medications: atorvastatin 40 mg daily Medications tried in the past:   Antiplatelet regimen: aspirin 81 mg daily for PAD  The 10-year ASCVD risk score (Arnett DK, et al., 2019) is: 16.1%   Values used to calculate the score:     Age: 1 years     Sex: Female     Is Non-Hispanic African American: Yes     Diabetic: Yes     Tobacco smoker: No     Systolic Blood Pressure: 114 mmHg     Is BP treated: Yes     HDL Cholesterol: 44.8 mg/dL     Total Cholesterol: 153 mg/dL    Heart Failure:  Current medications:  ACEi/ARB/ARNI: Entresto 49/51 mg twice daily  SGLT2i: Farxiga 10 mg daily Beta blocker: carvedilol 12.5 mg twice daily Mineralocorticoid Receptor Antagonist: none (spironolactone just d/c due to hyperkalemia and reduced renal function) Diuretic regimen: none (furosemide d/c due to  renal function decline)  Current home blood pressure readings: 116/70 average since decreased Entresto Current home weights: weight daily, staying stable  Patient denies volume overload signs or symptoms including shortness of breath, lower extremity edema, increased use of pillows at night Patients denies s/sx of low blood pressure at this time  Current medication access support: pt has dual Medicare/Medicaid - no issues getting medications   Objective:  Lab Results  Component Value Date   HGBA1C 6.1 03/07/2023    Lab Results  Component Value Date   CREATININE 1.47 (H) 03/07/2023   BUN 30 (H) 03/07/2023   NA 139 03/07/2023   K 4.6 03/07/2023   CL 106 03/07/2023   CO2 27 03/07/2023    Lab Results  Component Value Date   CHOL 153 02/26/2023   HDL 44.80 02/26/2023   LDLCALC 76 02/26/2023   LDLDIRECT 162.5 01/30/2013   TRIG 164.0 (H) 02/26/2023   CHOLHDL 3 02/26/2023    Medications Reviewed Today   Medications were not reviewed in this encounter       Assessment/Plan:   Hyperlipidemia/ASCVD Risk Reduction: - Currently uncontrolled. LDL goal <55 due to PAD + P3IR - Reviewed long term complications of uncontrolled cholesterol - Reviewed dietary recommendations including increasing fiber, sweets/high fat foods in moderation - Recommend to increase atorvastatin to 80 mg if LDL remains >55 on next lipid panel    Heart Failure: - Currently appropriately managed - Reviewed appropriate blood pressure monitoring technique and reviewed goal blood pressure - Reviewed to  weigh daily and when to contact cardiology with weight gain - Reviewed dietary modifications including avoiding salt substitutes (due to potassium) - Recommend to start reduced dose of Entresto and monitor BP  Follow Up Plan: 3 month follow up  Arbutus Leas, PharmD, BCPS [facility] [phone]

## 2023-04-15 ENCOUNTER — Other Ambulatory Visit: Payer: Self-pay | Admitting: Internal Medicine

## 2023-04-15 DIAGNOSIS — K2100 Gastro-esophageal reflux disease with esophagitis, without bleeding: Secondary | ICD-10-CM

## 2023-04-19 DIAGNOSIS — N2581 Secondary hyperparathyroidism of renal origin: Secondary | ICD-10-CM | POA: Diagnosis not present

## 2023-04-19 DIAGNOSIS — E785 Hyperlipidemia, unspecified: Secondary | ICD-10-CM | POA: Diagnosis not present

## 2023-04-19 DIAGNOSIS — D631 Anemia in chronic kidney disease: Secondary | ICD-10-CM | POA: Diagnosis not present

## 2023-04-19 DIAGNOSIS — N390 Urinary tract infection, site not specified: Secondary | ICD-10-CM | POA: Diagnosis not present

## 2023-04-19 DIAGNOSIS — E1122 Type 2 diabetes mellitus with diabetic chronic kidney disease: Secondary | ICD-10-CM | POA: Diagnosis not present

## 2023-04-19 DIAGNOSIS — I129 Hypertensive chronic kidney disease with stage 1 through stage 4 chronic kidney disease, or unspecified chronic kidney disease: Secondary | ICD-10-CM | POA: Diagnosis not present

## 2023-04-19 DIAGNOSIS — N185 Chronic kidney disease, stage 5: Secondary | ICD-10-CM | POA: Diagnosis not present

## 2023-04-19 DIAGNOSIS — N1832 Chronic kidney disease, stage 3b: Secondary | ICD-10-CM | POA: Diagnosis not present

## 2023-04-19 DIAGNOSIS — I2510 Atherosclerotic heart disease of native coronary artery without angina pectoris: Secondary | ICD-10-CM | POA: Diagnosis not present

## 2023-04-19 DIAGNOSIS — I5020 Unspecified systolic (congestive) heart failure: Secondary | ICD-10-CM | POA: Diagnosis not present

## 2023-04-19 DIAGNOSIS — M1000 Idiopathic gout, unspecified site: Secondary | ICD-10-CM | POA: Diagnosis not present

## 2023-04-19 DIAGNOSIS — I120 Hypertensive chronic kidney disease with stage 5 chronic kidney disease or end stage renal disease: Secondary | ICD-10-CM | POA: Diagnosis not present

## 2023-04-19 LAB — CBC AND DIFFERENTIAL
HCT: 33 — AB (ref 36–46)
Hemoglobin: 10.6 — AB (ref 12.0–16.0)
Platelets: 268 10*3/uL (ref 150–400)
WBC: 3.7

## 2023-04-20 LAB — LAB REPORT - SCANNED
Creatinine, POC: 119 mg/dL
EGFR: 47

## 2023-04-29 ENCOUNTER — Other Ambulatory Visit: Payer: Self-pay

## 2023-04-29 DIAGNOSIS — I5020 Unspecified systolic (congestive) heart failure: Secondary | ICD-10-CM

## 2023-04-29 MED ORDER — ENTRESTO 49-51 MG PO TABS: 1.0000 | ORAL_TABLET | Freq: Two times a day (BID) | ORAL | 2 refills | Status: DC

## 2023-05-10 ENCOUNTER — Other Ambulatory Visit: Payer: Self-pay | Admitting: Internal Medicine

## 2023-05-13 NOTE — Progress Notes (Unsigned)
[facility] [address] [place] [address] Phone: [phone] Subjective:   Emily Wheeler, am serving as a scribe for Dr. Antoine Primas.  I'm seeing this patient by the request  of:  [name], MD  CC: Bilateral hip pain  [id number]  02/26/2023 Bilateral injections.  Discussed HEP  Discussed which activities to do  RTC in 12 weeks     Patient given bilateral injections today. Can repeat every 3 to 4 months if needed. Still concerned that most of it is coming from her back. Discussed icing regimen and home exercises, discussed avoiding certain activities. Increase activity as tolerated. No change in medications but does need the further evaluation for her heart failure and kidney dysfunction. Added other labs to see if anything else is potentially contributing.   Updated 05/14/2023 Emily Wheeler is a 67 y.o. female coming in with complaint of B hip and knee pain. Bilateral hip injections. Look at the L knee. Patient is having difficulty with the left knee.  Feels like she is getting weaker.  Sometimes has increasing instability.  Still feels though that the hips were the worst.  Waking her up at night.      Past Medical History:  Diagnosis Date   Anemia    mild   Anxiety    Arthritis    Asthma    Cardiomyopathy (HCC)    CHF (congestive heart failure) (HCC)    Diabetes mellitus without complication (HCC)    Frequency of urination    at night   GERD (gastroesophageal reflux disease)    potassium worsens reflux   Hemorrhoids, external    Hyperlipidemia    Hypertension    Neuromuscular disorder (HCC)    neuropathy   Numbness and tingling of both legs    Rheumatoid arthritis (HCC)    Vertigo    hx of   Past Surgical History:  Procedure Laterality Date   ABDOMINAL HYSTERECTOMY     CARDIAC CATHETERIZATION N/A 10/28/2014   Procedure: Left Heart Cath and Coronary Angiography;  Surgeon: Corky Crafts, MD;  Location:  [facility] INVASIVE CV LAB;  Service: Cardiovascular;  Laterality: N/A;   CARPAL TUNNEL RELEASE Left    COLONOSCOPY     pt states 11 yr ago in [place] had a colon with 2 polyps- one cecal polyp per pt. one ? location   COLONOSCOPY W/ POLYPECTOMY     CYST REMOVAL NECK     HEMORROIDECTOMY     MAXIMUM ACCESS (MAS)POSTERIOR LUMBAR INTERBODY FUSION (PLIF) 3 LEVEL N/A 10/08/2013   Procedure: FOR MAXIMUM ACCESS (MAS) POSTERIOR LUMBAR INTERBODY FUSION (PLIF) 3 LEVEL;  Surgeon: Maeola Harman, MD;  Location: [facility] NEURO ORS;  Service: Neurosurgery;  Laterality: N/A;  L3-4 L4-5 L5-S1 maximum access posterior lumbar interbody fusion with decompression   TUBAL LIGATION     Social History   Socioeconomic History   Marital status: Single    Spouse name: Not on file   Number of children: Not on file   Years of education: Not on file   Highest education level: Not on file  Occupational History   Not on file  Tobacco Use   Smoking status: Former    Passive exposure: Current   Smokeless tobacco: Never  Vaping Use   Vaping status: Never Used  Substance and Sexual Activity   Alcohol use: Not Currently   Drug use: No    Types: Marijuana   Sexual activity: Not Currently  Other Topics  Concern   Not on file  Social History Narrative   Not on file   Social Drivers of Health   Financial Resource Strain: Low Risk  (07/31/2022)   Overall Financial Resource Strain (CARDIA)    Difficulty of Paying Living Expenses: Not hard at all  Food Insecurity: No Food Insecurity (01/28/2023)   Hunger Vital Sign    Worried About Running Out of Food in the Last Year: Never true    Ran Out of Food in the Last Year: Never true  Transportation Needs: No Transportation Needs (01/28/2023)   PRAPARE - Administrator, Civil Service (Medical): No    Lack of Transportation (Non-Medical): No  Physical Activity: Inactive (07/31/2022)   Exercise Vital Sign    Days of Exercise per Week: 0 days    Minutes of Exercise per Session: 0 min   Stress: No Stress Concern Present (07/31/2022)   [name] of Occupational Health - Occupational Stress Questionnaire    Feeling of Stress : Not at all  Social Connections: Moderately Isolated (07/31/2022)   Social Connection and Isolation Panel [NHANES]    Frequency of Communication with Friends and Family: More than three times a week    Frequency of Social Gatherings with Friends and Family: More than three times a week    Attends Religious Services: More than 4 times per year    Active Member of [employer] or Organizations: No    Attends Banker Meetings: Never    Marital Status: Widowed   Allergies  Allergen Reactions   Lisinopril Cough   Family History  Problem Relation Age of Onset   Early death Father    Heart disease Father    Hypertension Sister    Hypertension Brother    Diabetes Brother    Colon polyps Mother    Hashimoto's thyroiditis Sister    Colon cancer Maternal Grandmother        in her 55s   Stomach cancer Maternal Aunt    Non-Hodgkin's lymphoma Sister    Alcohol abuse Neg Hx    COPD Neg Hx    Depression Neg Hx    Drug abuse Neg Hx    Hearing loss Neg Hx    Hyperlipidemia Neg Hx    Kidney disease Neg Hx    Stroke Neg Hx    Esophageal cancer Neg Hx    Rectal cancer Neg Hx     Current Outpatient Medications (Endocrine & Metabolic):    dapagliflozin propanediol (FARXIGA) 10 MG TABS tablet, Take 1 tablet (10 mg total) by mouth daily before breakfast.   Dulaglutide (TRULICITY) 0.75 MG/0.5ML SOAJ, INJECT 0.75MG  SUBCUTANEOUSLY ONCE A WEEK  Current Outpatient Medications (Cardiovascular):    atorvastatin (LIPITOR) 40 MG tablet, Take 1 tablet by mouth once daily   carvedilol (COREG) 12.5 MG tablet, Take 1 tablet (12.5 mg total) by mouth 2 (two) times daily. TAKE AN EXTRA  1/2 TABLET 6.25 MG AS NEEDED FOR PALPITATIONS   sacubitril-valsartan (ENTRESTO) 49-51 MG, Take 1 tablet by mouth 2 (two) times daily.  Current Outpatient Medications  (Respiratory):    levalbuterol (XOPENEX HFA) 45 MCG/ACT inhaler, Inhale 2 puffs into the lungs every 8 (eight) hours as needed for wheezing.  Current Outpatient Medications (Analgesics):    allopurinol (ZYLOPRIM) 300 MG tablet, Take 1 tablet (300 mg total) by mouth daily.   aspirin EC 81 MG tablet, Take 1 tablet (81 mg total) by mouth daily.   leflunomide (ARAVA) 20 MG tablet, Take 1 tablet  by mouth once daily   Current Outpatient Medications (Other):    Accu-Chek FastClix Lancets MISC, USE 1  TO CHECK GLUCOSE THREE TIMES DAILY   Blood Glucose Monitoring Suppl (ACCU-CHEK GUIDE) w/Device KIT, 1 Act by Does not apply route 3 (three) times daily.   calcium carbonate (OS-CAL) 1250 (500 Ca) MG chewable tablet, Chew 1 tablet by mouth daily.   citalopram (CELEXA) 20 MG tablet, Take 1 tablet by mouth once daily   esomeprazole (NEXIUM) 40 MG capsule, Take 1 capsule by mouth once daily   gabapentin (NEURONTIN) 600 MG tablet, TAKE 1 TABLET BY MOUTH THREE TIMES DAILY   glucose blood (ACCU-CHEK GUIDE) test strip, 1 each by Other route 3 (three) times daily.   Lancets Misc. (ACCU-CHEK FASTCLIX LANCET) KIT, Inject 1 Act into the skin 3 (three) times daily.   Omega-3 Fatty Acids (FISH OIL) 1200 MG CAPS, Take 1,200 mg by mouth daily.    Reviewed prior external information including notes and imaging from  primary care provider As well as notes that were available from care everywhere and other healthcare systems.  Past medical history, social, surgical and family history all reviewed in electronic medical record.  No pertanent information unless stated regarding to the chief complaint.   Review of Systems:  No headache, visual changes, nausea, vomiting, diarrhea, constipation, dizziness, abdominal pain, skin rash, fevers, chills, night sweats, weight loss, swollen lymph nodes, body aches, joint swelling, chest pain, shortness of breath, mood changes. POSITIVE muscle aches  Objective  Blood pressure  126/78, pulse 87, height 5\' 4"  (1.626 m), weight 222 lb (100.7 kg), last menstrual period 05/29/1991, SpO2 96%.   General: No apparent distress alert and oriented x3 mood and affect normal, dressed appropriately.  HEENT: Pupils equal, extraocular movements intact  Respiratory: Patient's speak in full sentences and does not appear short of breath  Severely antalgic gait noted. Severe tenderness to palpation of the greater trochanteric areas bilaterally.  Patient unable to do Pearlean Brownie secondary to body habitus and pain.   Procedure: Real-time Ultrasound Guided Injection of right greater trochanteric bursitis secondary to patient's body habitus Device: GE Logiq Q7 Ultrasound guided injection is preferred based studies that show increased duration, increased effect, greater accuracy, decreased procedural pain, increased response rate, and decreased cost with ultrasound guided versus blind injection.  Verbal informed consent obtained.  Time-out conducted.  Noted no overlying erythema, induration, or other signs of local infection.  Skin prepped in a sterile fashion.  Local anesthesia: Topical Ethyl chloride.  With sterile technique and under real time ultrasound guidance:  Greater trochanteric area was visualized and patient's bursa was noted. A 22-gauge 3 inch needle was inserted and 4 cc of 0.5% Marcaine and 1 cc of Kenalog 40 mg/dL was injected. Pictures taken Completed without difficulty  Pain immediately resolved suggesting accurate placement of the medication.  Advised to call if fevers/chills, erythema, induration, drainage, or persistent bleeding.  Impression: Technically successful ultrasound guided injection.   Procedure: Real-time Ultrasound Guided Injection of left greater trochanteric bursitis secondary to patient's body habitus Device: GE Logiq Q7 Ultrasound guided injection is preferred based studies that show increased duration, increased effect, greater accuracy, decreased  procedural pain, increased response rate, and decreased cost with ultrasound guided versus blind injection.  Verbal informed consent obtained.  Time-out conducted.  Noted no overlying erythema, induration, or other signs of local infection.  Skin prepped in a sterile fashion.  Local anesthesia: Topical Ethyl chloride.  With sterile technique and under real time ultrasound  guidance:  Greater trochanteric area was visualized and patient's bursa was noted. A 22-gauge 3 inch needle was inserted and 4 cc of 0.5% Marcaine and 1 cc of Kenalog 40 mg/dL was injected. Pictures taken Completed without difficulty  Pain immediately resolved suggesting accurate placement of the medication.  Advised to call if fevers/chills, erythema, induration, drainage, or persistent bleeding.  Impression: Technically successful ultrasound guided injection.   Impression and Recommendations:    The above documentation has been reviewed and is accurate and complete Judi Saa, DO

## 2023-05-14 ENCOUNTER — Other Ambulatory Visit: Payer: Self-pay

## 2023-05-14 ENCOUNTER — Ambulatory Visit: Payer: 59 | Admitting: Family Medicine

## 2023-05-14 VITALS — BP 126/78 | HR 87 | Ht 64.0 in | Wt 222.0 lb

## 2023-05-14 DIAGNOSIS — M25551 Pain in right hip: Secondary | ICD-10-CM

## 2023-05-14 DIAGNOSIS — M7061 Trochanteric bursitis, right hip: Secondary | ICD-10-CM

## 2023-05-14 DIAGNOSIS — M25552 Pain in left hip: Secondary | ICD-10-CM

## 2023-05-14 DIAGNOSIS — M7062 Trochanteric bursitis, left hip: Secondary | ICD-10-CM

## 2023-05-15 ENCOUNTER — Encounter: Payer: Self-pay | Admitting: Family Medicine

## 2023-05-15 NOTE — Assessment & Plan Note (Addendum)
Chronic, with worsening symptoms again.  Secondary to patient's severe spinal stenosis of the back though I do think that this continues to be an difficulty secondary to atrophy.  Unfortunately secondary to patient's other comorbidities including rheumatoid arthritis and heart failure and patient is likely not a surgical candidate or would be high risk.  We will continue with this treatment as long as this continues to make some improvement.  Follow-up with me again in 3 months.  Social determinant of health is including patient is physically inactive secondary to comorbidities.

## 2023-05-20 ENCOUNTER — Other Ambulatory Visit: Payer: Self-pay | Admitting: Internal Medicine

## 2023-05-20 MED ORDER — ATORVASTATIN CALCIUM 40 MG PO TABS: 40.0000 mg | ORAL_TABLET | Freq: Every day | ORAL | 0 refills | Status: DC

## 2023-05-25 NOTE — Progress Notes (Deleted)
 Office Visit    Patient Name: Emily Wheeler Date of Encounter: 05/25/2023  PCP:  [name], MD   [facility]  Cardiologist:  Meriam Sprague, MD (Inactive)  Advanced Practice Provider:  No care team member to display Electrophysiologist:  None  HPI    Emily Wheeler is a 67 y.o. female with a past medical history significant for HFrEF with improved EF, mild to moderate coronary artery disease, diabetes mellitus type 2, RA, hypertension, and hyperlipidemia presents today for 7-month follow-up.She is new to me previously followed by Dr Shari Prows  Patient has history of nonischemic cardiomyopathy that was diagnosed in 2016.  Echocardiogram 08/2014 showed reduced EF at 35 to 40%.  Cardiac catheterization June 2016 showed mild CAD thus a diagnosis of nonischemic cardiomyopathy was made. Repeat TTE 12/21 with EF 40 to 45%.    2 prior back surgeries and was suffering from back pain which is chronic and bursitis in her hips bilaterally.    Cardiac MRI 12/12/22 EF 38% RVEF 28% no gad enhancement estimated cardiac output 2.6 L/min  Hospitalized 8/31-01/29/23 for CHF diuresed and d/c home Cr elevated to 1.7. She was d/c with entresto mid dose, farxiga and coreg 12.5 mg bid an aldactone 12.5 mg daily  Most recent CR 1.24 04/20/23   Aldactone d/c due to elevated K  ***     Past Medical History    Past Medical History:  Diagnosis Date   Anemia    mild   Anxiety    Arthritis    Asthma    Cardiomyopathy (HCC)    CHF (congestive heart failure) (HCC)    Diabetes mellitus without complication (HCC)    Frequency of urination    at night   GERD (gastroesophageal reflux disease)    potassium worsens reflux   Hemorrhoids, external    Hyperlipidemia    Hypertension    Neuromuscular disorder (HCC)    neuropathy   Numbness and tingling of both legs    Rheumatoid arthritis (HCC)    Vertigo    hx of   Past Surgical History:  Procedure Laterality  Date   ABDOMINAL HYSTERECTOMY     CARDIAC CATHETERIZATION N/A 10/28/2014   Procedure: Left Heart Cath and Coronary Angiography;  Surgeon: Corky Crafts, MD;  Location: [facility] INVASIVE CV LAB;  Service: Cardiovascular;  Laterality: N/A;   CARPAL TUNNEL RELEASE Left    COLONOSCOPY     pt states 11 yr ago in [place] had a colon with 2 polyps- one cecal polyp per pt. one ? location   COLONOSCOPY W/ POLYPECTOMY     CYST REMOVAL NECK     HEMORROIDECTOMY     MAXIMUM ACCESS (MAS)POSTERIOR LUMBAR INTERBODY FUSION (PLIF) 3 LEVEL N/A 10/08/2013   Procedure: FOR MAXIMUM ACCESS (MAS) POSTERIOR LUMBAR INTERBODY FUSION (PLIF) 3 LEVEL;  Surgeon: Maeola Harman, MD;  Location: [facility] NEURO ORS;  Service: Neurosurgery;  Laterality: N/A;  L3-4 L4-5 L5-S1 maximum access posterior lumbar interbody fusion with decompression   TUBAL LIGATION      Allergies  Allergies  Allergen Reactions   Lisinopril Cough   EKGs/Labs/Other Studies Reviewed:   The following studies were reviewed today:  Right LE Venous Doppler 03/03/2021: Summary:  RIGHT:  - No evidence of deep vein thrombosis in the lower extremity. No indirect  evidence of obstruction proximal to the inguinal ligament.  - No cystic structure found in the popliteal fossa.  - 0.17 cm x 0.17 cm knot behind  knee with no color flow; possible lipoma     LEFT:  - No evidence of common femoral vein obstruction.   TTE 04/2020: IMPRESSIONS    1. No left ventricular thrombus is seen. Left ventricular ejection  fraction, by estimation, is 40 to 45%. Left ventricular ejection fraction  is 43% by biplane modified Simpson's method. The left ventricle has mildly  decreased function. The left  ventricle demonstrates global hypokinesis. The left ventricular internal  cavity size was mildly dilated. Left ventricular diastolic parameters are  consistent with Grade I diastolic dysfunction (impaired relaxation).   2. Right ventricular systolic function is normal. The right  ventricular  size is normal.   3. The mitral valve is normal in structure. No evidence of mitral valve  regurgitation. No evidence of mitral stenosis.   4. The aortic valve is normal in structure. Aortic valve regurgitation is  not visualized. No aortic stenosis is present.   5. The inferior vena cava is normal in size with greater than 50%  respiratory variability, suggesting right atrial pressure of 3 mmHg.   Comparison(s): Prior images reviewed side by side. The left ventricular  function has improved.    ABIs 11/2019 Normal   Echocardiogram 04/27/20 EF 40-45, Gr 1 DD, normal RVSF   Echocardiogram 12/25/19 EF 30-35, normal RVSF, trivial MR   Echocardiogram 04/02/2018 EF 35-40, moderate diffuse HK, grade 1 diastolic dysfunction, mildly calcified aortic valve leaflets, atrial septal lipomatous hypertrophy, trivial PI   ABIs 10/31/2016 Normal    Echocardiogram 04/10/2016 EF 35-40   Cardiac catheterization 10/28/2014 LAD mild diffuse disease RI irregularities LCx ostial 50, mid 25 RCA mild disease; RPDA 60 (small vessel) EF 35-45, LVEDP 18  EKG:  EKG is  not ordered today.    Recent Labs: 01/26/2023: Hemoglobin 10.7; Platelets 250 01/27/2023: ALT 22; B Natriuretic Peptide 30.7; TSH 2.984 03/07/2023: BUN 30; Creatinine, Ser 1.47; Potassium 4.6; Sodium 139  Recent Lipid Panel    Component Value Date/Time   CHOL 153 02/26/2023 1627   TRIG 164.0 (H) 02/26/2023 1627   HDL 44.80 02/26/2023 1627   CHOLHDL 3 02/26/2023 1627   VLDL 32.8 02/26/2023 1627   LDLCALC 76 02/26/2023 1627   LDLDIRECT 162.5 01/30/2013 1602    Home Medications   No outpatient medications have been marked as taking for the 06/03/23 encounter (Appointment) with Wendall Stade, MD.     Review of Systems      All other systems reviewed and are otherwise negative except as noted above.  Physical Exam    VS:  LMP 05/29/1991  , BMI There is no height or weight on file to calculate BMI.  Wt Readings from  Last 3 Encounters:  05/14/23 222 lb (100.7 kg)  03/07/23 221 lb (100.2 kg)  02/26/23 221 lb (100.2 kg)     GEN: Well nourished, well developed, in no acute distress. HEENT: normal. Neck: Supple, no JVD, carotid bruits, or masses. Cardiac: RRR, no murmurs, rubs, or gallops. No clubbing, cyanosis, edema.  Radials/PT 2+ and equal bilaterally.  Respiratory:  Respirations regular and unlabored, clear to auscultation bilaterally. GI: Soft, nontender, nondistended. MS: No deformity or atrophy. Skin: Warm and dry, no rash. Neuro:  Strength and sensation are intact. Psych: Normal affect.  Assessment & Plan    Palpitations --defer monitor changes and medication changes for now -no evidence of PAF or other significant arrhythmias  Chronic systolic heart failure with improved EF/nonischemic cardiomyopathy -Euvolemic on exam -Continue current medication regimen of aspirin 81 mg  daily, Lipitor 40 mg daily, Coreg 12.5 mg twice daily and an extra 6.25 as needed for palpitations, Farxiga 10 mg daily, omega-3 fatty acids 1200 mg daily,   -Entresto dose cut back to mid dose due to CR/K - Aldactone d/c due to hyperkalemia - *** Essential hypertension -Continue current medication regimen -Well-controlled today at 120/66  Coronary artery disease -no angina -continue current medications  Hyperlipidemia -Recent LDL 68, HDL 47, total cholesterol 409, triglycerides 123 (02/06/2022) -Continue Lipitor 40 mg daily -Will be due for updated lipid panel with PCP  Type 2 diabetes mellitus -A1c 5.9, improved -Continue current medications, per PCP  ***  Signed, Charlton Haws, MD 05/25/2023, 2:45 PM [facility]

## 2023-06-03 ENCOUNTER — Ambulatory Visit: Payer: 59 | Admitting: Cardiovascular Disease

## 2023-06-05 ENCOUNTER — Other Ambulatory Visit: Payer: Self-pay | Admitting: Internal Medicine

## 2023-06-05 DIAGNOSIS — E118 Type 2 diabetes mellitus with unspecified complications: Secondary | ICD-10-CM

## 2023-06-10 ENCOUNTER — Other Ambulatory Visit: Payer: Self-pay | Admitting: Family Medicine

## 2023-06-10 ENCOUNTER — Other Ambulatory Visit: Payer: Self-pay | Admitting: Internal Medicine

## 2023-06-10 DIAGNOSIS — M069 Rheumatoid arthritis, unspecified: Secondary | ICD-10-CM

## 2023-06-10 DIAGNOSIS — E118 Type 2 diabetes mellitus with unspecified complications: Secondary | ICD-10-CM

## 2023-06-11 MED ORDER — ALLOPURINOL 300 MG PO TABS: 300.0000 mg | ORAL_TABLET | Freq: Every day | ORAL | 0 refills | Status: DC

## 2023-06-12 ENCOUNTER — Other Ambulatory Visit: Payer: Self-pay | Admitting: Internal Medicine

## 2023-06-12 DIAGNOSIS — E118 Type 2 diabetes mellitus with unspecified complications: Secondary | ICD-10-CM

## 2023-06-13 ENCOUNTER — Encounter: Payer: Self-pay | Admitting: Internal Medicine

## 2023-06-13 ENCOUNTER — Other Ambulatory Visit: Payer: Self-pay | Admitting: Internal Medicine

## 2023-06-13 DIAGNOSIS — E785 Hyperlipidemia, unspecified: Secondary | ICD-10-CM

## 2023-06-13 DIAGNOSIS — E118 Type 2 diabetes mellitus with unspecified complications: Secondary | ICD-10-CM

## 2023-06-13 DIAGNOSIS — M069 Rheumatoid arthritis, unspecified: Secondary | ICD-10-CM

## 2023-06-13 DIAGNOSIS — N1831 Chronic kidney disease, stage 3a: Secondary | ICD-10-CM

## 2023-06-13 DIAGNOSIS — F419 Anxiety disorder, unspecified: Secondary | ICD-10-CM

## 2023-06-13 MED ORDER — CITALOPRAM HYDROBROMIDE 20 MG PO TABS: 20.0000 mg | ORAL_TABLET | Freq: Every day | ORAL | 0 refills | Status: DC

## 2023-06-13 MED ORDER — LEFLUNOMIDE 20 MG PO TABS: 20.0000 mg | ORAL_TABLET | Freq: Every day | ORAL | 0 refills | Status: DC

## 2023-06-13 MED ORDER — ATORVASTATIN CALCIUM 40 MG PO TABS: 40.0000 mg | ORAL_TABLET | Freq: Every day | ORAL | 0 refills | Status: DC

## 2023-06-13 MED ORDER — DAPAGLIFLOZIN PROPANEDIOL 10 MG PO TABS: 10.0000 mg | ORAL_TABLET | Freq: Every day | ORAL | 0 refills | Status: DC

## 2023-06-14 ENCOUNTER — Other Ambulatory Visit: Payer: Self-pay | Admitting: Internal Medicine

## 2023-06-16 ENCOUNTER — Other Ambulatory Visit: Payer: Self-pay | Admitting: Internal Medicine

## 2023-06-16 DIAGNOSIS — M069 Rheumatoid arthritis, unspecified: Secondary | ICD-10-CM

## 2023-06-24 ENCOUNTER — Other Ambulatory Visit: Payer: Self-pay

## 2023-06-24 DIAGNOSIS — E118 Type 2 diabetes mellitus with unspecified complications: Secondary | ICD-10-CM

## 2023-06-24 MED ORDER — TRULICITY 0.75 MG/0.5ML ~~LOC~~ SOAJ: 0.7500 mg | SUBCUTANEOUS | 0 refills | Status: DC

## 2023-06-24 MED ORDER — ALLOPURINOL 300 MG PO TABS: 300.0000 mg | ORAL_TABLET | Freq: Every day | ORAL | 0 refills | Status: DC

## 2023-06-26 NOTE — Progress Notes (Unsigned)
Cardiology Office Note    Date:  06/27/2023  ID:  Emily, Wheeler 05/05/1956, MRN [id number] PCP:  [name], MD  Cardiologist:  Meriam Sprague, MD (Inactive)  Electrophysiologist:  None   Chief Complaint: f/u CHF  History of Present Illness: .    Emily Wheeler is a 68 y.o. female with visit-pertinent history of chronic HFrEF/NICM, PVCs, nonobstructive CAD, CKD 3b, DM, RA, HTN, HLD, anemia, anxiety, neuropathy. She was diagnosed with HF in 2015 with EF 35-40%. Nuclear stress test was abnormal prompting cardiac cath showing mild-moderate diffuse CAD, recommended for medical therapy. She has had periodic echo surveillance. Last echo 02/2022 EF 40%, global HK. cMRI 11/2022 showed mild LVE with mild LVH with global hypokinesis EF 38%, mild RVE with hypokinesis RVEF 28%, no scar, infiltration or infarct, decreased cardiac output and normal valves. She also had prior monitor in 2023 showing NSR, 3 brief runs SVT (longest lasting 5 beats), 6.3% PVCS, <1% PACs. She was last admitted for acute respiratory distress improved with diuresis in 01/2023. She had AKI on CKD with diuresis so she held Entresto and spironolactone for 3 days. Her PCP later stopped spironolactone 12.5mg  daily in 02/2023 due to hyperkalemia of 5.6. She also required reduction in Entresto in follow-up 02/2023 due to occasional lightheadedness, SBP 114/62.  She returns for follow-up today reporting she is overall stable. Ths includes NYHA class 2-3 dyspnea on exertion. She is also limited in her activity due to her RA. She has occasional periods where she will feel fluid retention come and go with her breathing. She reports she saw nephrology a few months ago who considered restarting Lasix but opted not to; she is not sure why. Today is one of her good days. She has minimal ankle edema. Her palpitations are well controlled. She reports a significant family history of cardiac disease. Her father died suddenly at 43 years  old; he was taking some sort of heart medicine the years before this. All of his brothers also died young. She herself has two children. They are in good health but her 46 year old grandson is being monitored for some sort of valve issue. She does not recall any viral infections before original dx of HF.  Labwork independently reviewed: 03/2023 scan Hgb 10.6, plt 268 02/2023 K 4.6, Cr 1.47, LDL 76, trig 165 01/2023 TSH OK, BNP wnl 01/2023 LFTs ok  ROS: .    Please see the history of present illness.  All other systems are reviewed and otherwise negative.  Studies Reviewed: Marland Kitchen    EKG:  EKG is ordered today, personally reviewed, demonstrating NSR 84bpm, occasional PVCs, LAFB, one PVC, poor R wave progression, nonspecific STTW changes  CV Studies: Cardiac studies reviewed are outlined and summarized above. Otherwise please see EMR for full report.   Current Reported Medications:.    Current Meds  Medication Sig   Accu-Chek FastClix Lancets MISC USE 1  TO CHECK GLUCOSE THREE TIMES DAILY   allopurinol (ZYLOPRIM) 300 MG tablet Take 1 tablet (300 mg total) by mouth daily.   aspirin EC 81 MG tablet Take 1 tablet (81 mg total) by mouth daily.   atorvastatin (LIPITOR) 40 MG tablet Take 1 tablet (40 mg total) by mouth daily.   Blood Glucose Monitoring Suppl (ACCU-CHEK GUIDE) w/Device KIT 1 Act by Does not apply route 3 (three) times daily.   calcium carbonate (OS-CAL) 1250 (500 Ca) MG chewable tablet Chew 1 tablet by mouth daily.   carvedilol (  COREG) 12.5 MG tablet Take 1 tablet (12.5 mg total) by mouth 2 (two) times daily. TAKE AN EXTRA  1/2 TABLET 6.25 MG AS NEEDED FOR PALPITATIONS   citalopram (CELEXA) 20 MG tablet Take 1 tablet (20 mg total) by mouth daily.   dapagliflozin propanediol (FARXIGA) 10 MG TABS tablet Take 1 tablet (10 mg total) by mouth daily before breakfast.   Dulaglutide (TRULICITY) 0.75 MG/0.5ML SOAJ Inject 0.75 mg into the skin once a week.   esomeprazole (NEXIUM) 40 MG  capsule Take 1 capsule by mouth once daily   gabapentin (NEURONTIN) 600 MG tablet TAKE 1 TABLET BY MOUTH THREE TIMES DAILY   glucose blood (ACCU-CHEK GUIDE) test strip 1 each by Other route 3 (three) times daily.   leflunomide (ARAVA) 20 MG tablet Take 1 tablet by mouth once daily   levalbuterol (XOPENEX HFA) 45 MCG/ACT inhaler Inhale 2 puffs into the lungs every 8 (eight) hours as needed for wheezing.   Omega-3 Fatty Acids (FISH OIL) 1200 MG CAPS Take 1,200 mg by mouth daily.    sacubitril-valsartan (ENTRESTO) 49-51 MG Take 1 tablet by mouth 2 (two) times daily.    Physical Exam:    VS:  BP 122/74   Pulse 84   Ht 5\' 4"  (1.626 m)   Wt 224 lb 3.2 oz (101.7 kg)   LMP 05/29/1991   SpO2 99%   BMI 38.48 kg/m    Wt Readings from Last 3 Encounters:  06/27/23 224 lb 3.2 oz (101.7 kg)  05/14/23 222 lb (100.7 kg)  03/07/23 221 lb (100.2 kg)    GEN: Well nourished, well developed in no acute distress NECK: No JVD; No carotid bruits CARDIAC: RRR, no murmurs, rubs, gallops RESPIRATORY:  Clear to auscultation without rales, wheezing or rhonchi  ABDOMEN: Soft, non-tender, non-distended EXTREMITIES:  No edema; No acute deformity   Asessement and Plan:.    1. Chronic HFrEF/NICM - she reports she is at baseline today. She does have NYHA class 2-3 dyspnea. The etiology of her cardiomyopathy has not been determined, question familial given the significant history on her dad's side of the family. Her prior monitor did not reveal a PVC burden that seemed significant enough to give way to a cardiomyopathy. She thankfully reports palpitations have been better controlled. Her cMRI in 11/2022 did not show any infiltrative disease but raised question of low output. I have recommended we refer her to the [facility] clinic for their input on further workup (including question of genetic testing) and management. Will recheck labs today, consider restart spironolactone with close monitoring of potassium in HF  follow-up. Will otherwise hold off on additional GDMT at this time given issues with lightheadedness in October on higher dose Entresto.  2. PVCs - recheck lytes today. Continue carvedilol. Hold off dose titration given question of low output on cMRI.  3. Nonobstructive CAD, HLD - no recent angina. Dyspnea remains stable. Continue ASA 81mg  daily. Titrate atorvastatin 80mg  for goal LDL <70 with f/u CMET/lipids in 3 months.  4. HTN - manage in context above.     Disposition: Refer to AHF clinic. Will otherwise arrange gen cards f/u in 6 months - she was originally planned to see Dr. Eden Emms upon Dr. Devin Going departure but this was deferred due to the snow day. She would like to keep the plan to establish with him.  Signed, Laurann Montana, PA-C

## 2023-06-27 ENCOUNTER — Encounter: Payer: Self-pay | Admitting: Physician Assistant

## 2023-06-27 ENCOUNTER — Ambulatory Visit: Payer: 59 | Attending: Physician Assistant | Admitting: Physician Assistant

## 2023-06-27 VITALS — BP 122/74 | HR 84 | Ht 64.0 in | Wt 224.2 lb

## 2023-06-27 DIAGNOSIS — I428 Other cardiomyopathies: Secondary | ICD-10-CM

## 2023-06-27 DIAGNOSIS — I493 Ventricular premature depolarization: Secondary | ICD-10-CM | POA: Diagnosis not present

## 2023-06-27 DIAGNOSIS — I10 Essential (primary) hypertension: Secondary | ICD-10-CM

## 2023-06-27 DIAGNOSIS — E785 Hyperlipidemia, unspecified: Secondary | ICD-10-CM

## 2023-06-27 DIAGNOSIS — I5022 Chronic systolic (congestive) heart failure: Secondary | ICD-10-CM

## 2023-06-27 DIAGNOSIS — I2510 Atherosclerotic heart disease of native coronary artery without angina pectoris: Secondary | ICD-10-CM | POA: Diagnosis not present

## 2023-06-27 MED ORDER — ATORVASTATIN CALCIUM 80 MG PO TABS: 80.0000 mg | ORAL_TABLET | Freq: Every day | ORAL | 3 refills | Status: AC

## 2023-06-27 NOTE — Patient Instructions (Signed)
Medication Instructions:  Increase atorvastatin to 80 mg  *If you need a refill on your cardiac medications before your next appointment, please call your pharmacy*   Lab Work: Cmet and Mag today Cmet and lipids in 3 months  If you have labs (blood work) drawn today and your tests are completely normal, you will receive your results only by: MyChart Message (if you have MyChart) OR A paper copy in the mail If you have any lab test that is abnormal or we need to change your treatment, we will call you to review the results.   Testing/Procedures: None   Follow-Up: At [facility], you and your health needs are our priority.  As part of our continuing mission to provide you with exceptional heart care, we have created designated Provider Care Teams.  These Care Teams include your primary Cardiologist (physician) and Advanced Practice Providers (APPs -  Physician Assistants and Nurse Practitioners) who all work together to provide you with the care you need, when you need it.     Your next appointment:   6 month(s)  Provider:   Eden Emms     Other Instructions

## 2023-07-01 DIAGNOSIS — I10 Essential (primary) hypertension: Secondary | ICD-10-CM | POA: Diagnosis not present

## 2023-07-01 DIAGNOSIS — E785 Hyperlipidemia, unspecified: Secondary | ICD-10-CM | POA: Diagnosis not present

## 2023-07-01 LAB — COMPREHENSIVE METABOLIC PANEL
ALT: 21 [IU]/L (ref 0–32)
AST: 22 [IU]/L (ref 0–40)
Albumin: 3.8 g/dL — ABNORMAL LOW (ref 3.9–4.9)
Alkaline Phosphatase: 99 [IU]/L (ref 44–121)
BUN/Creatinine Ratio: 15 (ref 12–28)
BUN: 20 mg/dL (ref 8–27)
Bilirubin Total: 0.3 mg/dL (ref 0.0–1.2)
CO2: 22 mmol/L (ref 20–29)
Calcium: 9.2 mg/dL (ref 8.7–10.3)
Chloride: 107 mmol/L — ABNORMAL HIGH (ref 96–106)
Creatinine, Ser: 1.3 mg/dL — ABNORMAL HIGH (ref 0.57–1.00)
Globulin, Total: 2 g/dL (ref 1.5–4.5)
Glucose: 97 mg/dL (ref 70–99)
Potassium: 4.4 mmol/L (ref 3.5–5.2)
Sodium: 143 mmol/L (ref 134–144)
Total Protein: 5.8 g/dL — ABNORMAL LOW (ref 6.0–8.5)
eGFR: 45 mL/min/{1.73_m2} — ABNORMAL LOW (ref >59)

## 2023-07-01 LAB — MAGNESIUM: Magnesium: 2.1 mg/dL (ref 1.6–2.3)

## 2023-07-11 ENCOUNTER — Telehealth (HOSPITAL_COMMUNITY): Payer: Self-pay

## 2023-07-11 ENCOUNTER — Other Ambulatory Visit: Payer: 59 | Admitting: Pharmacist

## 2023-07-11 DIAGNOSIS — I5020 Unspecified systolic (congestive) heart failure: Secondary | ICD-10-CM

## 2023-07-11 DIAGNOSIS — E785 Hyperlipidemia, unspecified: Secondary | ICD-10-CM

## 2023-07-11 NOTE — Progress Notes (Signed)
07/11/2023  Name: Emily Wheeler MRN: [id number] DOB: 29-Apr-1956  Chief Complaint  Patient presents with   Medication Management    Emily Wheeler is a 68 y.o. year old female who presented for a telephone visit.   They were referred to the pharmacist by their PCP for assistance in managing  CKD or HFrEF .   Subjective:   Care Team: Primary Care Provider: [name], MD ; Next Scheduled Visit: not scheduled Cardiologist: Dr. Eden Emms; Next Scheduled Visit: 06/03/2023  Medication Access/Adherence  Current Pharmacy:  [id number] - [address] ([address]), [place] - [address] [place] [address] Phone: [phone] Fax: [phone]  [facility] [address], [place] - [address] [place] [address] Phone: [phone] Fax: [phone]   Patient reports affordability concerns with their medications: No  Patient reports access/transportation concerns to their pharmacy: No  Patient reports adherence concerns with their medications:  No     Hyperlipidemia/ASCVD Risk Reduction  Current lipid lowering medications: atorvastatin 40 mg daily - cardio increased to 80 mg, however pt has not yet started because she is concerned about the high dose   Antiplatelet regimen: aspirin 81 mg daily for PAD  The 10-year ASCVD risk score (Arnett DK, et al., 2019) is: 18.3%   Values used to calculate the score:     Age: 25 years     Sex: Female     Is Non-Hispanic African American: Yes     Diabetic: Yes     Tobacco smoker: No     Systolic Blood Pressure: 122 mmHg     Is BP treated: Yes     HDL Cholesterol: 44.8 mg/dL     Total Cholesterol: 153 mg/dL   Diet: 1 piece of fried chicken per week Hamburger 1 per week Steak once per month Eats fish, bakes chicken  Heart Failure:  Current medications:  ACEi/ARB/ARNI: Entresto 49/51 mg twice daily  SGLT2i: Farxiga 10 mg daily Beta blocker:  carvedilol 12.5 mg twice daily Mineralocorticoid Receptor Antagonist: none (spironolactone just d/c due to hyperkalemia and reduced renal function) Diuretic regimen: none (furosemide d/c due to renal function decline)  Current home blood pressure readings: 116/70 average since decreased Entresto Current home weights: weight daily, staying stable  Patient denies volume overload signs or symptoms including shortness of breath, lower extremity edema, increased use of pillows at night Patients denies s/sx of low blood pressure at this time  Current medication access support: pt has dual Medicare/Medicaid - no issues getting medications   Objective:  Lab Results  Component Value Date   HGBA1C 6.1 03/07/2023    Lab Results  Component Value Date   CREATININE 1.30 (H) 07/01/2023   BUN 20 07/01/2023   NA 143 07/01/2023   K 4.4 07/01/2023   CL 107 (H) 07/01/2023   CO2 22 07/01/2023    Lab Results  Component Value Date   CHOL 153 02/26/2023   HDL 44.80 02/26/2023   LDLCALC 76 02/26/2023   LDLDIRECT 162.5 01/30/2013   TRIG 164.0 (H) 02/26/2023   CHOLHDL 3 02/26/2023    Medications Reviewed Today   Medications were not reviewed in this encounter       Assessment/Plan:   Hyperlipidemia/ASCVD Risk Reduction: - Currently uncontrolled. LDL goal <55 due to PAD + X3AT - Reviewed long term complications of uncontrolled cholesterol - Reviewed dietary recommendations including increasing fiber, sweets/high fat foods in moderation - Explained reasoning  for increased atorvastatin dose and LDL goal. Recommended to increased atorvastatin to 80 mg and monitor for myalgia.    Heart Failure: - Currently appropriately managed - Reviewed appropriate blood pressure monitoring technique and reviewed goal blood pressure - Reviewed to weigh daily and when to contact cardiology with weight gain - Reviewed dietary modifications including avoiding salt substitutes (due to potassium) -  Recommend to start reduced dose of Entresto and monitor BP  Follow Up Plan: After Pcp f/u 2/24  Arbutus Leas, PharmD, BCPS [facility] [phone]

## 2023-07-11 NOTE — Patient Instructions (Signed)
It was a pleasure speaking with you today!  Increase atorvastatin to 80 mg daily.  Feel free to call with any questions or concerns!  Arbutus Leas, PharmD, BCPS, CPP Clinical Pharmacist Practitioner  Primary Care at [facility] [phone]

## 2023-07-22 ENCOUNTER — Ambulatory Visit (INDEPENDENT_AMBULATORY_CARE_PROVIDER_SITE_OTHER): Payer: 59 | Admitting: Internal Medicine

## 2023-07-22 ENCOUNTER — Encounter: Payer: Self-pay | Admitting: Internal Medicine

## 2023-07-22 VITALS — BP 122/86 | HR 83 | Temp 98.0°F | Ht 64.0 in | Wt 226.4 lb

## 2023-07-22 DIAGNOSIS — E785 Hyperlipidemia, unspecified: Secondary | ICD-10-CM

## 2023-07-22 DIAGNOSIS — N1831 Chronic kidney disease, stage 3a: Secondary | ICD-10-CM | POA: Diagnosis not present

## 2023-07-22 DIAGNOSIS — Z23 Encounter for immunization: Secondary | ICD-10-CM

## 2023-07-22 DIAGNOSIS — D510 Vitamin B12 deficiency anemia due to intrinsic factor deficiency: Secondary | ICD-10-CM

## 2023-07-22 DIAGNOSIS — E118 Type 2 diabetes mellitus with unspecified complications: Secondary | ICD-10-CM | POA: Diagnosis not present

## 2023-07-22 DIAGNOSIS — M150 Primary generalized (osteo)arthritis: Secondary | ICD-10-CM | POA: Insufficient documentation

## 2023-07-22 DIAGNOSIS — Z0001 Encounter for general adult medical examination with abnormal findings: Secondary | ICD-10-CM

## 2023-07-22 DIAGNOSIS — Z0000 Encounter for general adult medical examination without abnormal findings: Secondary | ICD-10-CM

## 2023-07-22 DIAGNOSIS — M1A00X0 Idiopathic chronic gout, unspecified site, without tophus (tophi): Secondary | ICD-10-CM | POA: Insufficient documentation

## 2023-07-22 DIAGNOSIS — Z6833 Body mass index (BMI) 33.0-33.9, adult: Secondary | ICD-10-CM | POA: Insufficient documentation

## 2023-07-22 DIAGNOSIS — M51369 Other intervertebral disc degeneration, lumbar region without mention of lumbar back pain or lower extremity pain: Secondary | ICD-10-CM | POA: Insufficient documentation

## 2023-07-22 LAB — HEMOGLOBIN A1C: Hgb A1c MFr Bld: 6.1 % (ref 4.6–6.5)

## 2023-07-22 LAB — CK: Total CK: 90 U/L (ref 7–177)

## 2023-07-22 MED ORDER — BOOSTRIX 5-2.5-18.5 LF-MCG/0.5 IM SUSP: 0.5000 mL | Freq: Once | INTRAMUSCULAR | 0 refills | Status: AC

## 2023-07-22 MED ORDER — SHINGRIX 50 MCG/0.5ML IM SUSR: 0.5000 mL | Freq: Once | INTRAMUSCULAR | 1 refills | Status: AC

## 2023-07-22 NOTE — Progress Notes (Unsigned)
 Subjective:  Patient ID: Emily Wheeler, female    DOB: Jul 22, 1955  Age: 68 y.o. MRN: [id number]  CC: Annual Exam, Diabetes, Congestive Heart Failure, and Anemia   HPI Yvetta I Pester presents for a CPX and f/up ---    Outpatient Medications Prior to Visit  Medication Sig Dispense Refill   Accu-Chek FastClix Lancets MISC USE 1  TO CHECK GLUCOSE THREE TIMES DAILY 306 each 0   allopurinol (ZYLOPRIM) 300 MG tablet Take 1 tablet (300 mg total) by mouth daily. 90 tablet 0   aspirin EC 81 MG tablet Take 1 tablet (81 mg total) by mouth daily. 90 tablet 1   atorvastatin (LIPITOR) 80 MG tablet Take 1 tablet (80 mg total) by mouth daily. 90 tablet 3   Blood Glucose Monitoring Suppl (ACCU-CHEK GUIDE) w/Device KIT 1 Act by Does not apply route 3 (three) times daily. 2 kit 2   calcium carbonate (OS-CAL) 1250 (500 Ca) MG chewable tablet Chew 1 tablet by mouth daily.     carvedilol (COREG) 12.5 MG tablet Take 1 tablet (12.5 mg total) by mouth 2 (two) times daily. TAKE AN EXTRA  1/2 TABLET 6.25 MG AS NEEDED FOR PALPITATIONS 180 tablet 2   citalopram (CELEXA) 20 MG tablet Take 1 tablet (20 mg total) by mouth daily. 90 tablet 0   dapagliflozin propanediol (FARXIGA) 10 MG TABS tablet Take 1 tablet (10 mg total) by mouth daily before breakfast. 90 tablet 0   Dulaglutide (TRULICITY) 0.75 MG/0.5ML SOAJ Inject 0.75 mg into the skin once a week. 8 mL 0   esomeprazole (NEXIUM) 40 MG capsule Take 1 capsule by mouth once daily 90 capsule 0   gabapentin (NEURONTIN) 600 MG tablet TAKE 1 TABLET BY MOUTH THREE TIMES DAILY 270 tablet 0   glucose blood (ACCU-CHEK GUIDE) test strip 1 each by Other route 3 (three) times daily. 300 each 1   leflunomide (ARAVA) 20 MG tablet Take 1 tablet by mouth once daily 90 tablet 0   levalbuterol (XOPENEX HFA) 45 MCG/ACT inhaler Inhale 2 puffs into the lungs every 8 (eight) hours as needed for wheezing. 3 each 3   Omega-3 Fatty Acids (FISH OIL) 1200 MG CAPS Take 1,200 mg by mouth  daily.      sacubitril-valsartan (ENTRESTO) 49-51 MG Take 1 tablet by mouth 2 (two) times daily. 180 tablet 2   No facility-administered medications prior to visit.    ROS Review of Systems  Respiratory:  Positive for shortness of breath.   Musculoskeletal:  Positive for arthralgias. Negative for myalgias.    Objective:  BP 122/86 (BP Location: Left Arm, Patient Position: Sitting, Cuff Size: Large)   Pulse 83   Temp 98 F (36.7 C) (Oral)   Ht 5\' 4"  (1.626 m)   Wt 226 lb 6.4 oz (102.7 kg)   LMP 05/29/1991   SpO2 96%   BMI 38.86 kg/m   BP Readings from Last 3 Encounters:  07/22/23 122/86  06/27/23 122/74  05/14/23 126/78    Wt Readings from Last 3 Encounters:  07/22/23 226 lb 6.4 oz (102.7 kg)  06/27/23 224 lb 3.2 oz (101.7 kg)  05/14/23 222 lb (100.7 kg)    Physical Exam  Lab Results  Component Value Date   WBC 3.7 04/19/2023   HGB 10.6 (A) 04/19/2023   HCT 33 (A) 04/19/2023   PLT 268 04/19/2023   GLUCOSE 97 07/01/2023   CHOL 153 02/26/2023   TRIG 164.0 (H) 02/26/2023   HDL 44.80 02/26/2023  LDLDIRECT 162.5 01/30/2013   LDLCALC 76 02/26/2023   ALT 21 07/01/2023   AST 22 07/01/2023   NA 143 07/01/2023   K 4.4 07/01/2023   CL 107 (H) 07/01/2023   CREATININE 1.30 (H) 07/01/2023   BUN 20 07/01/2023   CO2 22 07/01/2023   TSH 2.984 01/27/2023   INR 0.9 08/19/2018   HGBA1C 6.1 03/07/2023   MICROALBUR 1.4 03/07/2023    CT Angio Chest PE W and/or Wo Contrast Result Date: 01/27/2023 CLINICAL DATA:  Pulmonary embolism suspected, low to intermediate probability, positive D-dimer. Shortness of breath for 2 weeks and tingling in heart. Swelling in feet and ankles. EXAM: CT ANGIOGRAPHY CHEST WITH CONTRAST TECHNIQUE: Multidetector CT imaging of the chest was performed using the standard protocol during bolus administration of intravenous contrast. Multiplanar CT image reconstructions and MIPs were obtained to evaluate the vascular anatomy. RADIATION DOSE REDUCTION:  This exam was performed according to the departmental dose-optimization program which includes automated exposure control, adjustment of the mA and/or kV according to patient size and/or use of iterative reconstruction technique. CONTRAST:  75mL OMNIPAQUE IOHEXOL 350 MG/ML SOLN COMPARISON:  None Available. FINDINGS: Cardiovascular: The heart is enlarged and there is a trace pericardial effusion. The aorta and pulmonary trunk are normal in caliber. No pulmonary embolism is seen. Mediastinum/Nodes: No enlarged mediastinal, hilar, or axillary lymph nodes. Thyroid gland, trachea, and esophagus demonstrate no significant findings. Lungs/Pleura: Lungs are clear. No pleural effusion or pneumothorax. Upper Abdomen: No acute abnormality. Musculoskeletal: Degenerative changes are present in the thoracic spine. No acute osseous abnormality. Review of the MIP images confirms the above findings. IMPRESSION: No evidence of pulmonary embolism or other acute process. Electronically Signed   By: Thornell Sartorius M.D.   On: 01/27/2023 05:03   DG Chest 2 View Result Date: 01/27/2023 CLINICAL DATA:  Shortness of breath EXAM: CHEST - 2 VIEW COMPARISON:  07/09/2014 FINDINGS: The heart size and mediastinal contours are within normal limits. Both lungs are clear. The visualized skeletal structures are unremarkable. IMPRESSION: No active cardiopulmonary disease. Electronically Signed   By: Charlett Nose M.D.   On: 01/27/2023 00:01    Assessment & Plan:  Stage 3a chronic kidney disease (HCC)  Vitamin B12 deficiency anemia due to intrinsic factor deficiency  Need for prophylactic vaccination with combined diphtheria-tetanus-pertussis (DTP) vaccine -     Boostrix; Inject 0.5 mLs into the muscle once for 1 dose.  Dispense: 0.5 mL; Refill: 0  Need for prophylactic vaccination and inoculation against varicella -     Shingrix; Inject 0.5 mLs into the muscle once for 1 dose.  Dispense: 0.5 mL; Refill: 1  Need for immunization against  influenza -     Flu Vaccine Trivalent High Dose (Fluad)     Follow-up: No follow-ups on file.  Sanda Linger, MD

## 2023-07-22 NOTE — Patient Instructions (Signed)

## 2023-07-28 ENCOUNTER — Other Ambulatory Visit: Payer: Self-pay | Admitting: Internal Medicine

## 2023-07-29 ENCOUNTER — Encounter: Payer: Self-pay | Admitting: Internal Medicine

## 2023-07-29 MED ORDER — GABAPENTIN 600 MG PO TABS: 600.0000 mg | ORAL_TABLET | Freq: Three times a day (TID) | ORAL | 0 refills | Status: DC

## 2023-07-31 ENCOUNTER — Other Ambulatory Visit: Payer: Self-pay | Admitting: Internal Medicine

## 2023-07-31 ENCOUNTER — Ambulatory Visit: Payer: 59

## 2023-07-31 VITALS — Ht 64.0 in | Wt 222.0 lb

## 2023-07-31 DIAGNOSIS — Z23 Encounter for immunization: Secondary | ICD-10-CM

## 2023-07-31 DIAGNOSIS — Z0000 Encounter for general adult medical examination without abnormal findings: Secondary | ICD-10-CM

## 2023-07-31 MED ORDER — SHINGRIX 50 MCG/0.5ML IM SUSR: 0.5000 mL | Freq: Once | INTRAMUSCULAR | 1 refills | Status: AC

## 2023-07-31 MED ORDER — BOOSTRIX 5-2.5-18.5 LF-MCG/0.5 IM SUSP: 0.5000 mL | Freq: Once | INTRAMUSCULAR | 0 refills | Status: AC

## 2023-07-31 NOTE — Patient Instructions (Signed)
 Emily Wheeler , Thank you for taking time to come for your Medicare Wellness Visit. I appreciate your ongoing commitment to your health goals. Please review the following plan we discussed and let me know if I can assist you in the future.   Referrals/Orders/Follow-Ups/Clinician Recommendations: It was nice talking to you today.  You are due for a Tetanus vaccine, a Shingles vaccine, Pneumonia vaccine.    This is a list of the screening recommended for you and due dates:  Health Maintenance  Topic Date Due   Zoster (Shingles) Vaccine (1 of 2) Never done   DTaP/Tdap/Td vaccine (2 - Td or Tdap) 01/09/2021   Pneumonia Vaccine (3 of 3 - PCV) 10/16/2023   Eye exam for diabetics  11/23/2023   Hemoglobin A1C  01/19/2024   Yearly kidney health urinalysis for diabetes  03/06/2024   Complete foot exam   03/06/2024   Yearly kidney function blood test for diabetes  06/30/2024   Medicare Annual Wellness Visit  07/30/2024   Mammogram  11/14/2024   Colon Cancer Screening  10/10/2025   Flu Shot  Completed   DEXA scan (bone density measurement)  Completed   Hepatitis C Screening  Completed   HPV Vaccine  Aged Out   COVID-19 Vaccine  Discontinued    Advanced directives: (Declined) Advance directive discussed with you today. Even though you declined this today, please call our office should you change your mind, and we can give you the proper paperwork for you to fill out.  Next Medicare Annual Wellness Visit scheduled for next year: Yes

## 2023-07-31 NOTE — Progress Notes (Signed)
 Subjective:   Emily Wheeler is a 68 y.o. who presents for a Medicare Wellness preventive visit.  Visit Complete: Virtual I connected with  Emily Wheeler on 07/31/23 by a audio enabled telemedicine application and verified that I am speaking with the correct person using two identifiers.  Patient Location: Home  Provider Location: Home Office  I discussed the limitations of evaluation and management by telemedicine. The patient expressed understanding and agreed to proceed.  Vital Signs: Because this visit was a virtual/telehealth visit, some criteria may be missing or patient reported. Any vitals not documented were not able to be obtained and vitals that have been documented are patient reported.  VideoDeclined- This patient declined Librarian, academic. Therefore the visit was completed with audio only.  AWV Questionnaire: No: Patient Medicare AWV questionnaire was not completed prior to this visit.  Cardiac Risk Factors include: advanced age (>67men, >48 women);hypertension;diabetes mellitus;dyslipidemia;Other (see comment), Risk factor comments: CKD, CHF     Objective:    Today's Vitals   07/31/23 1417  Weight: 222 lb (100.7 kg)  Height: 5\' 4"  (1.626 m)   Body mass index is 38.11 kg/m.     07/31/2023    2:29 PM 01/27/2023    8:07 AM 01/26/2023   10:39 PM 07/31/2022    2:29 PM 07/27/2021    9:44 AM 07/19/2020    3:52 PM 03/31/2016   11:11 AM  Advanced Directives  Does Patient Have a Medical Advance Directive? No Yes Yes Yes No No Yes  Type of Advance Directive   Living will;Healthcare Power of Attorney Living will;Healthcare Power of [employer] Power of [place];Living will  Does patient want to make changes to medical advance directive?  No - Patient declined     No - Patient declined  Copy of Healthcare Power of Attorney in Chart?    No - copy requested   Yes  Would patient like information on creating a medical advance directive?  No - Patient declined     No - Patient declined     Current Medications (verified) Outpatient Encounter Medications as of 07/31/2023  Medication Sig   Accu-Chek FastClix Lancets MISC USE 1  TO CHECK GLUCOSE THREE TIMES DAILY   allopurinol (ZYLOPRIM) 300 MG tablet Take 1 tablet (300 mg total) by mouth daily.   aspirin EC 81 MG tablet Take 1 tablet (81 mg total) by mouth daily.   atorvastatin (LIPITOR) 80 MG tablet Take 1 tablet (80 mg total) by mouth daily.   Blood Glucose Monitoring Suppl (ACCU-CHEK GUIDE) w/Device KIT 1 Act by Does not apply route 3 (three) times daily.   calcium carbonate (OS-CAL) 1250 (500 Ca) MG chewable tablet Chew 1 tablet by mouth daily.   carvedilol (COREG) 12.5 MG tablet Take 1 tablet (12.5 mg total) by mouth 2 (two) times daily. TAKE AN EXTRA  1/2 TABLET 6.25 MG AS NEEDED FOR PALPITATIONS   citalopram (CELEXA) 20 MG tablet Take 1 tablet (20 mg total) by mouth daily.   dapagliflozin propanediol (FARXIGA) 10 MG TABS tablet Take 1 tablet (10 mg total) by mouth daily before breakfast.   Dulaglutide (TRULICITY) 0.75 MG/0.5ML SOAJ Inject 0.75 mg into the skin once a week.   esomeprazole (NEXIUM) 40 MG capsule Take 1 capsule by mouth once daily   gabapentin (NEURONTIN) 600 MG tablet Take 1 tablet (600 mg total) by mouth 3 (three) times daily.   glucose blood (ACCU-CHEK GUIDE) test strip 1 each by Other route  3 (three) times daily.   leflunomide (ARAVA) 20 MG tablet Take 1 tablet by mouth once daily   levalbuterol (XOPENEX HFA) 45 MCG/ACT inhaler Inhale 2 puffs into the lungs every 8 (eight) hours as needed for wheezing.   Omega-3 Fatty Acids (FISH OIL) 1200 MG CAPS Take 1,200 mg by mouth daily.    sacubitril-valsartan (ENTRESTO) 49-51 MG Take 1 tablet by mouth 2 (two) times daily.   No facility-administered encounter medications on file as of 07/31/2023.    Allergies (verified) Lisinopril   History: Past Medical History:  Diagnosis Date   Anemia    mild   Anxiety     Arthritis    Asthma    Cardiomyopathy (HCC)    CHF (congestive heart failure) (HCC)    Diabetes mellitus without complication (HCC)    Frequency of urination    at night   GERD (gastroesophageal reflux disease)    potassium worsens reflux   Hemorrhoids, external    Hyperlipidemia    Hypertension    Neuromuscular disorder (HCC)    neuropathy   Numbness and tingling of both legs    Rheumatoid arthritis (HCC)    Vertigo    hx of   Past Surgical History:  Procedure Laterality Date   ABDOMINAL HYSTERECTOMY     CARDIAC CATHETERIZATION N/A 10/28/2014   Procedure: Left Heart Cath and Coronary Angiography;  Surgeon: Corky Crafts, MD;  Location: [facility] INVASIVE CV LAB;  Service: Cardiovascular;  Laterality: N/A;   CARPAL TUNNEL RELEASE Left    COLONOSCOPY     pt states 11 yr ago in [place] had a colon with 2 polyps- one cecal polyp per pt. one ? location   COLONOSCOPY W/ POLYPECTOMY     CYST REMOVAL NECK     HEMORROIDECTOMY     MAXIMUM ACCESS (MAS)POSTERIOR LUMBAR INTERBODY FUSION (PLIF) 3 LEVEL N/A 10/08/2013   Procedure: FOR MAXIMUM ACCESS (MAS) POSTERIOR LUMBAR INTERBODY FUSION (PLIF) 3 LEVEL;  Surgeon: Maeola Harman, MD;  Location: [facility] NEURO ORS;  Service: Neurosurgery;  Laterality: N/A;  L3-4 L4-5 L5-S1 maximum access posterior lumbar interbody fusion with decompression   TUBAL LIGATION     Family History  Problem Relation Age of Onset   Early death Father    Heart disease Father    Hypertension Sister    Hypertension Brother    Diabetes Brother    Colon polyps Mother    Hashimoto's thyroiditis Sister    Colon cancer Maternal Grandmother        in her 84s   Stomach cancer Maternal Aunt    Non-Hodgkin's lymphoma Sister    Alcohol abuse Neg Hx    COPD Neg Hx    Depression Neg Hx    Drug abuse Neg Hx    Hearing loss Neg Hx    Hyperlipidemia Neg Hx    Kidney disease Neg Hx    Stroke Neg Hx    Esophageal cancer Neg Hx    Rectal cancer Neg Hx    Social History    Socioeconomic History   Marital status: Single    Spouse name: Not on file   Number of children: 2   Years of education: Not on file   Highest education level: 12th grade  Occupational History   Occupation: RETIRED  Tobacco Use   Smoking status: Former    Passive exposure: Current   Smokeless tobacco: Never  Vaping Use   Vaping status: Never Used  Substance and Sexual Activity   Alcohol use:  Not Currently   Drug use: No    Types: Marijuana   Sexual activity: Not Currently  Other Topics Concern   Not on file  Social History Narrative   Lives alone/2025   Social Drivers of Health   Financial Resource Strain: High Risk (07/31/2023)   Overall Financial Resource Strain (CARDIA)    Difficulty of Paying Living Expenses: Hard  Food Insecurity: No Food Insecurity (07/31/2023)   Hunger Vital Sign    Worried About Running Out of Food in the Last Year: Never true    Ran Out of Food in the Last Year: Never true  Transportation Needs: No Transportation Needs (07/31/2023)   PRAPARE - Administrator, Civil Service (Medical): No    Lack of Transportation (Non-Medical): No  Physical Activity: Inactive (07/31/2023)   Exercise Vital Sign    Days of Exercise per Week: 0 days    Minutes of Exercise per Session: 0 min  Stress: No Stress Concern Present (07/31/2023)   [name] of Occupational Health - Occupational Stress Questionnaire    Feeling of Stress : Not at all  Social Connections: Moderately Isolated (07/31/2023)   Social Connection and Isolation Panel [NHANES]    Frequency of Communication with Friends and Family: More than three times a week    Frequency of Social Gatherings with Friends and Family: Once a week    Attends Religious Services: 1 to 4 times per year    Active Member of [employer] or Organizations: No    Attends Engineer, structural: Never    Marital Status: Never married    Tobacco Counseling Counseling given: Not Answered    Clinical  Intake:  Pre-visit preparation completed: Yes  Pain : No/denies pain     BMI - recorded: 38.11 Nutritional Status: BMI > 30  Obese Nutritional Risks: None Diabetes: Yes CBG done?: Yes (101 yesterday-per pt) CBG resulted in Enter/ Edit results?: No Did pt. bring in CBG monitor from home?: No  How often do you need to have someone help you when you read instructions, pamphlets, or other written materials from your doctor or pharmacy?: 1 - Never  Interpreter Needed?: No  Information entered by :: Zurich Carreno, RMA   Activities of Daily Living     07/31/2023    2:26 PM 01/28/2023    3:09 PM  In your present state of health, do you have any difficulty performing the following activities:  Hearing? 0 0  Vision? 0 0  Difficulty concentrating or making decisions? 0 0  Walking or climbing stairs? 0   Dressing or bathing? 0 0  Doing errands, shopping? 0 0  Preparing Food and eating ? N   Using the Toilet? N   In the past six months, have you accidently leaked urine? N   Do you have problems with loss of bowel control? N   Managing your Medications? N   Managing your Finances? N   Housekeeping or managing your Housekeeping? N     Patient Care Team: [name], MD as PCP - General (Internal Medicine) Meriam Sprague, MD (Inactive) as PCP - Cardiology (Cardiology) [place], [facility] as Consulting Physician (Ophthalmology) Erlene Quan, Vinnie Level, [facility] (Inactive) (Pharmacist) Judi Saa, DO as Consulting Physician (Family Medicine) Bonita Quin, [facility] (Pharmacist)  Indicate any recent Medical Services you may have received from other than [facility] providers in the past year (date may be approximate).     Assessment:   This is a routine  wellness examination for Emily Wheeler.  Hearing/Vision screen Hearing Screening - Comments:: Denies hearing difficulties   Vision Screening - Comments:: Wears eyeglasses   Goals Addressed             This Visit's  Progress    Patient Stated       To maintain my current health status by continuing to eat healthy, stay physically active and socially active.  Not really any physically-2025       Depression Screen     07/31/2023    2:35 PM 07/22/2023    1:04 PM 10/16/2022    9:10 AM 07/31/2022    2:28 PM 07/27/2021    9:45 AM 07/19/2020    3:51 PM 04/18/2020    3:00 PM  PHQ 2/9 Scores  PHQ - 2 Score 0 0 0 0 0 0 0  PHQ- 9 Score 0          Fall Risk     07/31/2023    2:30 PM 07/22/2023    1:04 PM 10/16/2022    9:10 AM 07/31/2022    2:00 PM 07/27/2021    9:44 AM  Fall Risk   Falls in the past year? 0 0 0 0 0  Number falls in past yr: 0 0 0 0   Injury with Fall? 0 0 0 0   Risk for fall due to : No Fall Risks No Fall Risks No Fall Risks No Fall Risks Medication side effect  Follow up Falls prevention discussed;Falls evaluation completed Falls evaluation completed Falls evaluation completed Falls prevention discussed;Education provided;Falls evaluation completed Falls evaluation completed;Education provided;Falls prevention discussed    MEDICARE RISK AT HOME:  Medicare Risk at Home Any stairs in or around the home?: Yes (5 steps outside) If so, are there any without handrails?: Yes Home free of loose throw rugs in walkways, pet beds, electrical cords, etc?: Yes Adequate lighting in your home to reduce risk of falls?: Yes Life alert?: No Use of a cane, walker or w/c?: Yes (cane) Grab bars in the bathroom?: Yes Shower chair or bench in shower?: Yes Elevated toilet seat or a handicapped toilet?: Yes  TIMED UP AND GO:  Was the test performed?  No  Cognitive Function: 6CIT completed        07/31/2023    2:31 PM 07/31/2022    2:29 PM 07/27/2021    9:48 AM  6CIT Screen  What Year? 0 points 0 points 0 points  What month? 0 points 0 points 0 points  What time? 0 points 0 points 0 points  Count back from 20 0 points 0 points 0 points  Months in reverse 0 points 0 points 0 points  Repeat phrase 2  points 0 points 0 points  Total Score 2 points 0 points 0 points    Immunizations Immunization History  Administered Date(s) Administered   Fluad Quad(high Dose 65+) 04/18/2020, 02/14/2021, 02/06/2022   Fluad Trivalent(High Dose 65+) 07/22/2023   Influenza, Quadrivalent, Recombinant, Inj, Pf 04/03/2018   Influenza,inj,Quad PF,6+ Mos 01/30/2013, 02/08/2014, 03/09/2015, 03/30/2016, 02/19/2017, 01/22/2019   PFIZER(Purple Top)SARS-COV-2 Vaccination 08/08/2019, 09/02/2019, 05/18/2020   Pneumococcal Polysaccharide-23 08/13/2017, 10/16/2022   Pneumococcal-Unspecified 05/29/2011   Tdap 01/10/2011    Screening Tests Health Maintenance  Topic Date Due   Zoster Vaccines- Shingrix (1 of 2) Never done   DTaP/Tdap/Td (2 - Td or Tdap) 01/09/2021   Pneumonia Vaccine 24+ Years old (3 of 3 - PCV) 10/16/2023   OPHTHALMOLOGY EXAM  11/23/2023   HEMOGLOBIN A1C  01/19/2024   Diabetic kidney evaluation - Urine ACR  03/06/2024   FOOT EXAM  03/06/2024   Diabetic kidney evaluation - eGFR measurement  06/30/2024   Medicare Annual Wellness (AWV)  07/30/2024   MAMMOGRAM  11/14/2024   Colonoscopy  10/10/2025   INFLUENZA VACCINE  Completed   DEXA SCAN  Completed   Hepatitis C Screening  Completed   HPV VACCINES  Aged Out   COVID-19 Vaccine  Discontinued    Health Maintenance  Health Maintenance Due  Topic Date Due   Zoster Vaccines- Shingrix (1 of 2) Never done   DTaP/Tdap/Td (2 - Td or Tdap) 01/09/2021   Pneumonia Vaccine 90+ Years old (3 of 3 - PCV) 10/16/2023   Health Maintenance Items Addressed: See Nurse Notes  Additional Screening:  Vision Screening: Recommended annual ophthalmology exams for early detection of glaucoma and other disorders of the eye.  Dental Screening: Recommended annual dental exams for proper oral hygiene  Community Resource Referral / Chronic Care Management: CRR required this visit?  No   CCM required this visit?  No     Plan:     I have personally  reviewed and noted the following in the patient's chart:   Medical and social history Use of alcohol, tobacco or illicit drugs  Current medications and supplements including opioid prescriptions. Patient is not currently taking opioid prescriptions. Functional ability and status Nutritional status Physical activity Advanced directives List of other physicians Hospitalizations, surgeries, and ER visits in previous 12 months Vitals Screenings to include cognitive, depression, and falls Referrals and appointments  In addition, I have reviewed and discussed with patient certain preventive protocols, quality metrics, and best practice recommendations. A written personalized care plan for preventive services as well as general preventive health recommendations were provided to patient.     Bertice Risse L Shirin Echeverry, CMA   07/31/2023   After Visit Summary: (MyChart) Due to this being a telephonic visit, the after visit summary with patients personalized plan was offered to patient via MyChart   Notes: Please refer to Routing Comments.

## 2023-08-01 DIAGNOSIS — I129 Hypertensive chronic kidney disease with stage 1 through stage 4 chronic kidney disease, or unspecified chronic kidney disease: Secondary | ICD-10-CM | POA: Diagnosis not present

## 2023-08-01 DIAGNOSIS — D631 Anemia in chronic kidney disease: Secondary | ICD-10-CM | POA: Diagnosis not present

## 2023-08-01 DIAGNOSIS — E1122 Type 2 diabetes mellitus with diabetic chronic kidney disease: Secondary | ICD-10-CM | POA: Diagnosis not present

## 2023-08-01 DIAGNOSIS — I5020 Unspecified systolic (congestive) heart failure: Secondary | ICD-10-CM | POA: Diagnosis not present

## 2023-08-01 DIAGNOSIS — E785 Hyperlipidemia, unspecified: Secondary | ICD-10-CM | POA: Diagnosis not present

## 2023-08-01 DIAGNOSIS — N1832 Chronic kidney disease, stage 3b: Secondary | ICD-10-CM | POA: Diagnosis not present

## 2023-08-02 LAB — LAB REPORT - SCANNED
Albumin, Urine POC: 34.6
Albumin/Creatinine Ratio, Urine, POC: 45
Creatinine, POC: 77.6 mg/dL
EGFR: 54

## 2023-08-04 ENCOUNTER — Other Ambulatory Visit: Payer: Self-pay | Admitting: Internal Medicine

## 2023-08-04 DIAGNOSIS — E118 Type 2 diabetes mellitus with unspecified complications: Secondary | ICD-10-CM

## 2023-08-06 ENCOUNTER — Encounter (HOSPITAL_COMMUNITY): Payer: Self-pay | Admitting: Cardiology

## 2023-08-06 ENCOUNTER — Ambulatory Visit (HOSPITAL_COMMUNITY)
Admission: RE | Admit: 2023-08-06 | Discharge: 2023-08-06 | Disposition: A | Discharge status: Home / Self Care | Payer: 59 | Source: Ambulatory Visit | Attending: Cardiology | Admitting: Cardiology

## 2023-08-06 ENCOUNTER — Other Ambulatory Visit (HOSPITAL_COMMUNITY): Payer: Self-pay

## 2023-08-06 VITALS — BP 110/70 | HR 82 | Wt 224.0 lb

## 2023-08-06 DIAGNOSIS — I428 Other cardiomyopathies: Secondary | ICD-10-CM | POA: Diagnosis not present

## 2023-08-06 DIAGNOSIS — E669 Obesity, unspecified: Secondary | ICD-10-CM | POA: Diagnosis not present

## 2023-08-06 DIAGNOSIS — R0602 Shortness of breath: Secondary | ICD-10-CM | POA: Diagnosis not present

## 2023-08-06 DIAGNOSIS — I2510 Atherosclerotic heart disease of native coronary artery without angina pectoris: Secondary | ICD-10-CM | POA: Diagnosis not present

## 2023-08-06 DIAGNOSIS — Z8249 Family history of ischemic heart disease and other diseases of the circulatory system: Secondary | ICD-10-CM | POA: Insufficient documentation

## 2023-08-06 DIAGNOSIS — I5022 Chronic systolic (congestive) heart failure: Secondary | ICD-10-CM

## 2023-08-06 DIAGNOSIS — R5383 Other fatigue: Secondary | ICD-10-CM | POA: Diagnosis not present

## 2023-08-06 DIAGNOSIS — Z79899 Other long term (current) drug therapy: Secondary | ICD-10-CM | POA: Insufficient documentation

## 2023-08-06 DIAGNOSIS — N1832 Chronic kidney disease, stage 3b: Secondary | ICD-10-CM | POA: Insufficient documentation

## 2023-08-06 LAB — BASIC METABOLIC PANEL
Anion gap: 7 (ref 5–15)
BUN: 24 mg/dL — ABNORMAL HIGH (ref 8–23)
CO2: 25 mmol/L (ref 22–32)
Calcium: 8.9 mg/dL (ref 8.9–10.3)
Chloride: 107 mmol/L (ref 98–111)
Creatinine, Ser: 1.6 mg/dL — ABNORMAL HIGH (ref 0.44–1.00)
GFR, Estimated: 35 mL/min — ABNORMAL LOW (ref >60)
Glucose, Bld: 108 mg/dL — ABNORMAL HIGH (ref 70–99)
Potassium: 4.2 mmol/L (ref 3.5–5.1)
Sodium: 139 mmol/L (ref 135–145)

## 2023-08-06 LAB — BRAIN NATRIURETIC PEPTIDE: B Natriuretic Peptide: 28.6 pg/mL (ref 0.0–100.0)

## 2023-08-06 MED ORDER — FUROSEMIDE 40 MG PO TABS: 40.0000 mg | ORAL_TABLET | ORAL | 3 refills | Status: AC | PRN

## 2023-08-06 NOTE — Progress Notes (Signed)
 ReDS Vest / Clip - 08/06/23 1200       ReDS Vest / Clip   Station Marker B    Ruler Value 35    ReDS Value Range Low volume    ReDS Actual Value 34

## 2023-08-06 NOTE — Patient Instructions (Signed)
 TAKE lasix 40 mg as needed for weight gain of 3 lb in 24 hours, 5 lb in a week, shortness of breath and edema.  Labs done today, your results will be available in MyChart, we will contact you for abnormal readings.  Genetic testing has been collected, this has to be sent to [facility] for processing and can take 1-2 weeks for [place] to get results back.  We will let you know the results once reviewed by your provider.  You are scheduled for a Cardiac Catheterization on Wednesday, March 26 with Dr.  Clearnce Hasten .  1. Please arrive at the [facility] (Main Entrance A) at [facility]: [address] [place], [place] [address] at 8:00 AM (This time is 2 hour(s) before your procedure to ensure your preparation).   Free valet parking service is available. You will check in at ADMITTING. The support person will be asked to wait in the waiting room.  It is OK to have someone drop you off and come back when you are ready to be discharged.    Special note: Every effort is made to have your procedure done on time. Please understand that emergencies sometimes delay scheduled procedures.  2. Diet: Do not eat solid foods after midnight.  The patient may have clear liquids until 5am upon the day of the procedure.  3. Medication instructions in preparation for your procedure:   Contrast Allergy: No   HOLD YOUR FARXIGA, TRULICITY AND LASIX THE MORNING OF YOUR PROCEDURE.  On the morning of your procedure, take any morning medicines NOT listed above.  You may use sips of water.  5. Plan to go home the same day, you will only stay overnight if medically necessary. 6. Bring a current list of your medications and current insurance cards. 7. You MUST have a responsible person to drive you home. 8. Someone MUST be with you the first 24 hours after you arrive home or your discharge will be delayed. 9. Please wear clothes that are easy to get on and off and wear slip-on shoes.  Your physician recommends  that you schedule a follow-up appointment in: 2 MONTHS (May) ** PLEASE CALL THE OFFICE IN APRIL TO ARRANGE YOUR FOLLOW UP APPOINTMENT.**  If you have any questions or concerns before your next appointment please send [place] a message through [place] or call our office at [phone].    TO LEAVE A MESSAGE FOR THE NURSE SELECT OPTION 2, PLEASE LEAVE A MESSAGE INCLUDING: YOUR NAME DATE OF BIRTH CALL BACK NUMBER REASON FOR CALL**this is important as we prioritize the call backs  YOU WILL RECEIVE A CALL BACK THE SAME DAY AS LONG AS YOU CALL BEFORE 4:00 PM  At the [facility], you and your health needs are our priority. As part of our continuing mission to provide you with exceptional heart care, we have created designated Provider Care Teams. These Care Teams include your primary Cardiologist (physician) and Advanced Practice Providers (APPs- Physician Assistants and Nurse Practitioners) who all work together to provide you with the care you need, when you need it.   You may see any of the following providers on your designated Care Team at your next follow up: Dr Arvilla Meres Dr Marca Ancona Dr. Dorthula Nettles Dr. Clearnce Hasten Amy Filbert Schilder, NP Robbie Lis, [place] [facility] [place], [place] Brynda Peon, NP [place] Lee, NP Clarisa Kindred, NP Karle Plumber, PharmD Enos Fling, PharmD   Please be sure to bring in all  your medications bottles to every appointment.    Thank you for choosing [facility]

## 2023-08-06 NOTE — Progress Notes (Signed)
 TTR genetic testing collected via blood per Dr Elwyn Lade.  Order form completed, signed and shipped with sample by FedEx to [facility].

## 2023-08-07 NOTE — H&P (View-Only) (Signed)
 ADVANCED HEART FAILURE NEW PATIENT CLINIC NOTE  Referring Physician: [name], MD  Primary Care: [name], MD Primary Cardiologist:  HPI: Emily Wheeler is a 68 y.o. female with a PMH of HFrEF,  who presents for initial visit for further evaluation and treatment of heart failure/cardiomyopathy.      Patient was diagnosed with HF in 2015 with EF 35-40%. Nuclear stress test was abnormal prompting cardiac cath showing mild-moderate diffuse CAD, recommended for medical therapy. She has had periodic echo surveillance. Last echo 02/2022 EF 40%, global HK. cMRI 11/2022 showed mild LVE with mild LVH with global hypokinesis EF 38%, mild RVE with hypokinesis RVEF 28%, no scar, infiltration or infarct, decreased cardiac output and normal valves. She also had prior monitor in 2023 showing NSR, 3 brief runs SVT (longest lasting 5 beats), 6.3% PVCS, <1% PACs.     SUBJECTIVE: Patient overall reports that she has had steady worsening of her heart failure symptoms.  Her chief complaint is fatigue and shortness of breath with minimal exertion.  She had to stop to walk multiple times on the way in and walking across the exam room would leave her winded according to her and her son.  She has had minimal swelling which she keeps under control with as needed Lasix.  Denies any significant palpitations or chest pain.  She has a significant family history of cardiac disease, her father died suddenly at 60 years old and all her brothers also died young, in their 60s and 30s.  She has 2 children who both deny any cardiac issues  PMH, current medications, allergies, social history, and family history reviewed in epic.  PHYSICAL EXAM: Vitals:   08/06/23 1151  BP: 110/70  Pulse: 82  SpO2: 100%   GENERAL: Well nourished and in no apparent distress at rest.  PULM:  Normal work of breathing, clear to auscultation bilaterally. Respirations are unlabored.  CARDIAC:  JVP: flat         Normal rate with  regular rhythm. No murmurs, rubs or gallops.  Trace LE edema. Warm and well perfused extremities. ABDOMEN: Soft, non-tender, non-distended. NEUROLOGIC: Patient is oriented x3 with no focal or lateralizing neurologic deficits.    DATA REVIEW  ECG: 05/2023: NSR, LAFB, normal qrs duration    ECHO: 2023: LVEF 40%, low normal RV function, normal valves.  CATH: LHC 2016: Mild diffuse disease.   CMR:  2024: LVEF 38%, reduced cardiac output, RVEF 28%, no LGE or scar, normal valves  ReDs reading: 34 %, normal     Heart failure review: - Classification: Heart failure with reduced EF - Etiology: Idiopathic - NYHA Class: IIIb - Volume status: Euvolemic - ACEi/ARB/ARNI: Maximally tolerated dose - Aldosterone antagonist: Plan to start at a subsequent visit - Beta-blocker: Maximally tolerated dose - Digoxin: Plan to start at a subsequent visit - Hydralazine/Nitrates: Not indicated - SGLT2i: Maximally tolerated dose - GLP-1: Consider in future - Advanced therapies: Actively working up - ICD: Currently uptitrating GDMT   ASSESSMENT & PLAN:  Chronic systolic heart failure: Nonischemic, though with mild CAD noted in 2016.  MRI without LGE so unlikely to be significant worsening of CAD.  Her symptoms have been progressive and she has had to recently decrease her GDMT.  Given this as well as her age, discussed obtaining right heart catheterization for baseline measurement of cardiac output. Advanced therapies if biventricular failure may be limited based on weight. - RHC - Continue prn diuretics, volume status is good -  Continue carvedilol 12.5 mg twice daily, Entresto 49/51 mg twice daily, Arciga 10 mg daily for now, will adjust after right heart cath -Consider digoxin based on results -Not on spironolactone given prior hyperkalemia -Genetic testing for cardiomyopathy ordered -Pending results could consider barostim  CAD: Mild, noted on previous LHC. - No angina. Continue aspirin,  atorvastatin 80 mg daily  CKD Stage IIIb: Creatinine 1.6, not volume overloaded. -Continue SGLT2  Obesity: May limit advanced therapy evaluation. - Consider GLP-1  Follow up in 2 months after RHC  Clearnce Hasten, MD Advanced [facility] Failure Mechanical Circulatory Support 08/07/23

## 2023-08-07 NOTE — Progress Notes (Signed)
 ADVANCED HEART FAILURE NEW PATIENT CLINIC NOTE  Referring Physician: [name], MD  Primary Care: [name], MD Primary Cardiologist:  HPI: Emily Wheeler is a 68 y.o. female with a PMH of HFrEF,  who presents for initial visit for further evaluation and treatment of heart failure/cardiomyopathy.      Patient was diagnosed with HF in 2015 with EF 35-40%. Nuclear stress test was abnormal prompting cardiac cath showing mild-moderate diffuse CAD, recommended for medical therapy. She has had periodic echo surveillance. Last echo 02/2022 EF 40%, global HK. cMRI 11/2022 showed mild LVE with mild LVH with global hypokinesis EF 38%, mild RVE with hypokinesis RVEF 28%, no scar, infiltration or infarct, decreased cardiac output and normal valves. She also had prior monitor in 2023 showing NSR, 3 brief runs SVT (longest lasting 5 beats), 6.3% PVCS, <1% PACs.     SUBJECTIVE: Patient overall reports that she has had steady worsening of her heart failure symptoms.  Her chief complaint is fatigue and shortness of breath with minimal exertion.  She had to stop to walk multiple times on the way in and walking across the exam room would leave her winded according to her and her son.  She has had minimal swelling which she keeps under control with as needed Lasix.  Denies any significant palpitations or chest pain.  She has a significant family history of cardiac disease, her father died suddenly at 60 years old and all her brothers also died young, in their 60s and 30s.  She has 2 children who both deny any cardiac issues  PMH, current medications, allergies, social history, and family history reviewed in epic.  PHYSICAL EXAM: Vitals:   08/06/23 1151  BP: 110/70  Pulse: 82  SpO2: 100%   GENERAL: Well nourished and in no apparent distress at rest.  PULM:  Normal work of breathing, clear to auscultation bilaterally. Respirations are unlabored.  CARDIAC:  JVP: flat         Normal rate with  regular rhythm. No murmurs, rubs or gallops.  Trace LE edema. Warm and well perfused extremities. ABDOMEN: Soft, non-tender, non-distended. NEUROLOGIC: Patient is oriented x3 with no focal or lateralizing neurologic deficits.    DATA REVIEW  ECG: 05/2023: NSR, LAFB, normal qrs duration    ECHO: 2023: LVEF 40%, low normal RV function, normal valves.  CATH: LHC 2016: Mild diffuse disease.   CMR:  2024: LVEF 38%, reduced cardiac output, RVEF 28%, no LGE or scar, normal valves  ReDs reading: 34 %, normal     Heart failure review: - Classification: Heart failure with reduced EF - Etiology: Idiopathic - NYHA Class: IIIb - Volume status: Euvolemic - ACEi/ARB/ARNI: Maximally tolerated dose - Aldosterone antagonist: Plan to start at a subsequent visit - Beta-blocker: Maximally tolerated dose - Digoxin: Plan to start at a subsequent visit - Hydralazine/Nitrates: Not indicated - SGLT2i: Maximally tolerated dose - GLP-1: Consider in future - Advanced therapies: Actively working up - ICD: Currently uptitrating GDMT   ASSESSMENT & PLAN:  Chronic systolic heart failure: Nonischemic, though with mild CAD noted in 2016.  MRI without LGE so unlikely to be significant worsening of CAD.  Her symptoms have been progressive and she has had to recently decrease her GDMT.  Given this as well as her age, discussed obtaining right heart catheterization for baseline measurement of cardiac output. Advanced therapies if biventricular failure may be limited based on weight. - RHC - Continue prn diuretics, volume status is good -  Continue carvedilol 12.5 mg twice daily, Entresto 49/51 mg twice daily, Arciga 10 mg daily for now, will adjust after right heart cath -Consider digoxin based on results -Not on spironolactone given prior hyperkalemia -Genetic testing for cardiomyopathy ordered -Pending results could consider barostim  CAD: Mild, noted on previous LHC. - No angina. Continue aspirin,  atorvastatin 80 mg daily  CKD Stage IIIb: Creatinine 1.6, not volume overloaded. -Continue SGLT2  Obesity: May limit advanced therapy evaluation. - Consider GLP-1  Follow up in 2 months after RHC  Clearnce Hasten, MD Advanced Heart Failure Mechanical Circulatory Support 08/07/23

## 2023-08-08 NOTE — Progress Notes (Signed)
 [name] [address] [place] [address] Phone: [phone] Subjective:   [name], am serving as a scribe for Dr. Antoine Primas.  I'm seeing this patient by the request  of:  Emily Grandchild, MD  CC: Multiple joint complaints  VZD:[id number]  05/14/2023 Chronic, with worsening symptoms again.  Secondary to patient's severe spinal stenosis of the back though I do think that this continues to be an difficulty secondary to atrophy.  Unfortunately secondary to patient's other comorbidities including rheumatoid arthritis and heart failure and patient is likely not a surgical candidate or would be high risk.  We will continue with this treatment as long as this continues to make some improvement.  Follow-up with me again in 3 months.  Social determinant of health is including patient is physically inactive secondary to comorbidities.     Updated 08/13/2023 See I Emily Wheeler is a 68 y.o. female coming in with complaint of hip and knee pain. Knee and hip pain. Here for injections.  Describes the pain is severe.  Affecting daily activities, having difficulty even sleeping at night secondary to the discomfort and pain.  Ambulating with the aid of a cane at this time.       Past Medical History:  Diagnosis Date   Anemia    mild   Anxiety    Arthritis    Asthma    Cardiomyopathy (HCC)    CHF (congestive heart failure) (HCC)    Diabetes mellitus without complication (HCC)    Frequency of urination    at night   GERD (gastroesophageal reflux disease)    potassium worsens reflux   Hemorrhoids, external    Hyperlipidemia    Hypertension    Neuromuscular disorder (HCC)    neuropathy   Numbness and tingling of both legs    Rheumatoid arthritis (HCC)    Vertigo    hx of   Past Surgical History:  Procedure Laterality Date   ABDOMINAL HYSTERECTOMY     CARDIAC CATHETERIZATION N/A 10/28/2014   Procedure: Left Heart Cath and Coronary Angiography;   Surgeon: Corky Crafts, MD;  Location: [facility]ASIVE CV [facility];  Service: Cardiovascular;  Laterality: N/A;   CARPAL TUNNEL RELEASE Left    COLONOSCOPY     pt states 11 yr ago in [place] had a colon with 2 polyps- one cecal polyp per pt. one ? location   COLONOSCOPY W/ POLYPECTOMY     CYST REMOVAL NECK     HEMORROIDECTOMY     MAXIMUM ACCESS (MAS)POSTERIOR LUMBAR INTERBODY FUSION (PLIF) 3 LEVEL N/A 10/08/2013   Procedure: FOR MAXIMUM ACCESS (MAS) POSTERIOR LUMBAR INTERBODY FUSION (PLIF) 3 LEVEL;  Surgeon: Maeola Harman, MD;  Location: [facility] NEURO ORS;  Service: Neurosurgery;  Laterality: N/A;  L3-4 L4-5 L5-S1 maximum access posterior lumbar interbody fusion with decompression   TUBAL LIGATION     Social History   Socioeconomic History   Marital status: Single    Spouse name: Not on file   Number of children: 2   Years of education: Not on file   Highest education level: 12th grade  Occupational History   Occupation: RETIRED  Tobacco Use   Smoking status: Former    Passive exposure: Current   Smokeless tobacco: Never  Vaping Use   Vaping status: Never Used  Substance and Sexual Activity   Alcohol use: Not Currently   Drug use: No    Types: Marijuana   Sexual activity: Not Currently  Other Topics  Concern   Not on file  Social History Narrative   Lives alone/2025   Social Drivers of Health   Financial Resource Strain: High Risk (07/31/2023)   Overall Financial Resource Strain (CARDIA)    Difficulty of Paying Living Expenses: Hard  Food Insecurity: No Food Insecurity (07/31/2023)   Hunger Vital Sign    Worried About Running Out of Food in the Last Year: Never true    Ran Out of Food in the Last Year: Never true  Transportation Needs: No Transportation Needs (07/31/2023)   PRAPARE - Administrator, Civil Service (Medical): No    Lack of Transportation (Non-Medical): No  Physical Activity: Inactive (07/31/2023)   Exercise Vital Sign    Days of Exercise per Week: 0 days     Minutes of Exercise per Session: 0 min  Stress: No Stress Concern Present (07/31/2023)   [name] of Occupational Health - Occupational Stress Questionnaire    Feeling of Stress : Not at all  Social Connections: Moderately Isolated (07/31/2023)   Social Connection and Isolation Panel [NHANES]    Frequency of Communication with Friends and Family: More than three times a week    Frequency of Social Gatherings with Friends and Family: Once a week    Attends Religious Services: 1 to 4 times per year    Active Member of [employer] or Organizations: No    Attends Engineer, structural: Never    Marital Status: Never married   Allergies  Allergen Reactions   Lisinopril Cough   Family History  Problem Relation Age of Onset   Early death Father    Heart disease Father    Hypertension Sister    Hypertension Brother    Diabetes Brother    Colon polyps Mother    Hashimoto's thyroiditis Sister    Colon cancer Maternal Grandmother        in her 83s   Stomach cancer Maternal Aunt    Non-Hodgkin's lymphoma Sister    Alcohol abuse Neg Hx    COPD Neg Hx    Depression Neg Hx    Drug abuse Neg Hx    Hearing loss Neg Hx    Hyperlipidemia Neg Hx    Kidney disease Neg Hx    Stroke Neg Hx    Esophageal cancer Neg Hx    Rectal cancer Neg Hx     Current Outpatient Medications (Endocrine & Metabolic):    dapagliflozin propanediol (FARXIGA) 10 MG TABS tablet, Take 1 tablet (10 mg total) by mouth daily before breakfast.   TRULICITY 0.75 MG/0.5ML SOAJ, INJECT 0.75 MG SUBCUTANEOUSLY ONCE A WEEK  Current Outpatient Medications (Cardiovascular):    atorvastatin (LIPITOR) 80 MG tablet, Take 1 tablet (80 mg total) by mouth daily.   carvedilol (COREG) 12.5 MG tablet, Take 1 tablet (12.5 mg total) by mouth 2 (two) times daily. TAKE AN EXTRA  1/2 TABLET 6.25 MG AS NEEDED FOR PALPITATIONS   furosemide (LASIX) 40 MG tablet, Take 1 tablet (40 mg total) by mouth as needed. For weight gain of 3lb in  24 hours, 5lb in a week   sacubitril-valsartan (ENTRESTO) 49-51 MG, Take 1 tablet by mouth 2 (two) times daily.  Current Outpatient Medications (Respiratory):    levalbuterol (XOPENEX HFA) 45 MCG/ACT inhaler, Inhale 2 puffs into the lungs every 8 (eight) hours as needed for wheezing.  Current Outpatient Medications (Analgesics):    allopurinol (ZYLOPRIM) 300 MG tablet, Take 1 tablet (300 mg total) by mouth daily.  aspirin EC 81 MG tablet, Take 1 tablet (81 mg total) by mouth daily.   leflunomide (ARAVA) 20 MG tablet, Take 1 tablet by mouth once daily   Current Outpatient Medications (Other):    Accu-Chek FastClix Lancets MISC, USE 1  TO CHECK GLUCOSE THREE TIMES DAILY   Blood Glucose Monitoring Suppl (ACCU-CHEK GUIDE) w/Device KIT, 1 Act by Does not apply route 3 (three) times daily.   calcium carbonate (OS-CAL) 1250 (500 Ca) MG chewable tablet, Chew 1 tablet by mouth daily.   citalopram (CELEXA) 20 MG tablet, Take 1 tablet (20 mg total) by mouth daily.   esomeprazole (NEXIUM) 40 MG capsule, Take 1 capsule by mouth once daily   gabapentin (NEURONTIN) 600 MG tablet, Take 1 tablet (600 mg total) by mouth 3 (three) times daily.   glucose blood (ACCU-CHEK GUIDE) test strip, 1 each by Other route 3 (three) times daily.   Omega-3 Fatty Acids (FISH OIL) 1200 MG CAPS, Take 1,200 mg by mouth daily.    Reviewed prior external information including notes and imaging from  primary care provider As well as notes that were available from care everywhere and other healthcare systems.  Past medical history, social, surgical and family history all reviewed in electronic medical record.  No pertanent information unless stated regarding to the chief complaint.   Review of Systems:  No headache, visual changes, nausea, vomiting, diarrhea, constipation, dizziness, abdominal pain, skin rash, fevers, chills, night sweats, weight loss, swollen lymph nodes, body aches, joint swelling, chest pain, shortness of  breath, mood changes. POSITIVE muscle aches  Objective  Blood pressure 126/82, pulse 87, height 5\' 4"  (1.626 m), last menstrual period 05/29/1991, SpO2 96%.   General: No apparent distress alert and oriented x3 mood and affect normal, dressed appropriately.  HEENT: Pupils equal, extraocular movements intact  Respiratory: Patient's speak in full sentences and does not appear short of breath  Cardiovascular: No lower extremity edema, non tender, no erythema  Patient does have tenderness to palpation over the greater trochanteric area bilaterally.  Positive pain in this area.  Does have some loss of lordosis. The knees do have some crepitus noted bilaterally.  Instability noted bilaterally.   After verbal consent patient was prepped with alcohol swab and with a 21-gauge 2 inch needle injected into the right greater trochanteric area with 2 cc of 0.5% Marcaine and 1 cc of Kenalog 40 mg/mL.  No blood loss.  Band-Aid placed.  Postinjection instructions given   After verbal consent patient was prepped with alcohol swab and with a 21-gauge 2 inch needle injected into the left greater trochanteric area with 2 cc of 0.5% Marcaine and 1 cc of Kenalog 40 mg/mL.  No blood loss.  Band-Aid placed.  Postinjection instructions given  After informed written and verbal consent, patient was seated on exam table. Right knee was prepped with alcohol swab and utilizing anterolateral approach, patient's right knee space was injected with 4:1  marcaine 0.5%: Kenalog 40mg /dL. Patient tolerated the procedure well without immediate complications.  After informed written and verbal consent, patient was seated on exam table. Left knee was prepped with alcohol swab and utilizing anterolateral approach, patient's left knee space was injected with 4:1  marcaine 0.5%: Kenalog 40mg /dL. Patient tolerated the procedure well without immediate complications.   Impression and Recommendations:     The above documentation has been  reviewed and is accurate and complete Judi Saa, DO

## 2023-08-13 ENCOUNTER — Encounter: Payer: Self-pay | Admitting: Family Medicine

## 2023-08-13 ENCOUNTER — Ambulatory Visit: Admitting: Family Medicine

## 2023-08-13 VITALS — BP 126/82 | HR 87 | Ht 64.0 in

## 2023-08-13 DIAGNOSIS — M170 Bilateral primary osteoarthritis of knee: Secondary | ICD-10-CM | POA: Diagnosis not present

## 2023-08-13 DIAGNOSIS — M7062 Trochanteric bursitis, left hip: Secondary | ICD-10-CM

## 2023-08-13 DIAGNOSIS — M7061 Trochanteric bursitis, right hip: Secondary | ICD-10-CM

## 2023-08-13 NOTE — Patient Instructions (Signed)
 You are awesome  10 weeks

## 2023-08-13 NOTE — Assessment & Plan Note (Signed)
 Repeat injections of both hips given again today.  Can repeat every 10 to 12 weeks.  In one of the safer things to do with patient having some of the difficulty with home exercises and icing regimen.  Patient is having so much difficulty with other things at the moment.  Is going to be seen for a cardiac catheterization in the near future.

## 2023-08-13 NOTE — Patient Instructions (Signed)
 Good to see you! Injection in hips and knees today See you again in 3 months

## 2023-08-21 ENCOUNTER — Encounter (HOSPITAL_COMMUNITY)
Admission: RE | Disposition: A | Discharge status: Home / Self Care | Payer: Self-pay | Source: Home / Self Care | Attending: Cardiology

## 2023-08-21 ENCOUNTER — Ambulatory Visit (HOSPITAL_COMMUNITY)
Admission: RE | Admit: 2023-08-21 | Discharge: 2023-08-21 | Disposition: A | Discharge status: Home / Self Care | Attending: Cardiology | Admitting: Cardiology

## 2023-08-21 ENCOUNTER — Encounter (HOSPITAL_COMMUNITY): Payer: Self-pay | Admitting: Cardiology

## 2023-08-21 ENCOUNTER — Other Ambulatory Visit: Payer: Self-pay

## 2023-08-21 DIAGNOSIS — N1832 Chronic kidney disease, stage 3b: Secondary | ICD-10-CM | POA: Insufficient documentation

## 2023-08-21 DIAGNOSIS — E669 Obesity, unspecified: Secondary | ICD-10-CM | POA: Insufficient documentation

## 2023-08-21 DIAGNOSIS — Z79899 Other long term (current) drug therapy: Secondary | ICD-10-CM | POA: Insufficient documentation

## 2023-08-21 DIAGNOSIS — I5022 Chronic systolic (congestive) heart failure: Secondary | ICD-10-CM | POA: Insufficient documentation

## 2023-08-21 DIAGNOSIS — Z8249 Family history of ischemic heart disease and other diseases of the circulatory system: Secondary | ICD-10-CM | POA: Insufficient documentation

## 2023-08-21 DIAGNOSIS — Z6838 Body mass index (BMI) 38.0-38.9, adult: Secondary | ICD-10-CM | POA: Insufficient documentation

## 2023-08-21 DIAGNOSIS — I2510 Atherosclerotic heart disease of native coronary artery without angina pectoris: Secondary | ICD-10-CM | POA: Insufficient documentation

## 2023-08-21 DIAGNOSIS — I509 Heart failure, unspecified: Secondary | ICD-10-CM | POA: Diagnosis not present

## 2023-08-21 LAB — CBC
HCT: 35.6 % — ABNORMAL LOW (ref 36.0–46.0)
Hemoglobin: 11.6 g/dL — ABNORMAL LOW (ref 12.0–15.0)
MCH: 32 pg (ref 26.0–34.0)
MCHC: 32.6 g/dL (ref 30.0–36.0)
MCV: 98.3 fL (ref 80.0–100.0)
Platelets: 252 10*3/uL (ref 150–400)
RBC: 3.62 MIL/uL — ABNORMAL LOW (ref 3.87–5.11)
RDW: 15.4 % (ref 11.5–15.5)
WBC: 4.7 10*3/uL (ref 4.0–10.5)
nRBC: 0 % (ref 0.0–0.2)

## 2023-08-21 LAB — GLUCOSE, CAPILLARY: Glucose-Capillary: 124 mg/dL — ABNORMAL HIGH (ref 70–99)

## 2023-08-21 SURGERY — RIGHT HEART CATH
Anesthesia: LOCAL

## 2023-08-21 MED ORDER — HEPARIN (PORCINE) IN NACL 1000-0.9 UT/500ML-% IV SOLN
INTRAVENOUS | Status: DC | PRN
  Administered 2023-08-21: 500 mL

## 2023-08-21 MED ORDER — LIDOCAINE HCL (PF) 1 % IJ SOLN
INTRAMUSCULAR | Status: DC | PRN
  Administered 2023-08-21: 2 mL

## 2023-08-21 MED ORDER — SODIUM CHLORIDE 0.9 % IV SOLN: INTRAVENOUS | Status: DC

## 2023-08-21 SURGICAL SUPPLY — 6 items
CATH SWAN GANZ 7F STRAIGHT (CATHETERS) IMPLANT
GLIDESHEATH SLENDER 7FR .021G (SHEATH) IMPLANT
PACK CARDIAC CATHETERIZATION (CUSTOM PROCEDURE TRAY) ×1 IMPLANT
SHEATH GLIDE SLENDER 4/5FR (SHEATH) IMPLANT
TRANSDUCER W/STOPCOCK (MISCELLANEOUS) IMPLANT
TUBING ART PRESS 72 MALE/FEM (TUBING) IMPLANT

## 2023-08-21 NOTE — Interval H&P Note (Signed)
 History and Physical Interval Note:  08/21/2023 5:03 PM  Emily Wheeler  has presented today for surgery, with the diagnosis of chf.  The various methods of treatment have been discussed with the patient and family. After consideration of risks, benefits and other options for treatment, the patient has consented to  Procedure(s): RIGHT HEART CATH (N/A) as a surgical intervention.  The patient's history has been reviewed, patient examined, no change in status, stable for surgery.  I have reviewed the patient's chart and labs.  Questions were answered to the patient's satisfaction.     [name]

## 2023-08-21 NOTE — Discharge Instructions (Addendum)

## 2023-08-22 ENCOUNTER — Encounter: Payer: Self-pay | Admitting: Internal Medicine

## 2023-08-22 LAB — POCT I-STAT EG7
Acid-Base Excess: 0 mmol/L (ref 0.0–2.0)
Acid-base deficit: 1 mmol/L (ref 0.0–2.0)
Bicarbonate: 24.5 mmol/L (ref 20.0–28.0)
Bicarbonate: 24.6 mmol/L (ref 20.0–28.0)
Calcium, Ion: 0.92 mmol/L — ABNORMAL LOW (ref 1.15–1.40)
Calcium, Ion: 0.95 mmol/L — ABNORMAL LOW (ref 1.15–1.40)
HCT: 33 % — ABNORMAL LOW (ref 36.0–46.0)
HCT: 34 % — ABNORMAL LOW (ref 36.0–46.0)
Hemoglobin: 11.2 g/dL — ABNORMAL LOW (ref 12.0–15.0)
Hemoglobin: 11.6 g/dL — ABNORMAL LOW (ref 12.0–15.0)
O2 Saturation: 62 %
O2 Saturation: 63 %
Potassium: 4 mmol/L (ref 3.5–5.1)
Potassium: 4.1 mmol/L (ref 3.5–5.1)
Sodium: 140 mmol/L (ref 135–145)
Sodium: 141 mmol/L (ref 135–145)
TCO2: 26 mmol/L (ref 22–32)
TCO2: 26 mmol/L (ref 22–32)
pCO2, Ven: 40.8 mmHg — ABNORMAL LOW (ref 44–60)
pCO2, Ven: 40.9 mmHg — ABNORMAL LOW (ref 44–60)
pH, Ven: 7.387 (ref 7.25–7.43)
pH, Ven: 7.387 (ref 7.25–7.43)
pO2, Ven: 33 mmHg (ref 32–45)
pO2, Ven: 33 mmHg (ref 32–45)

## 2023-08-29 ENCOUNTER — Other Ambulatory Visit: Payer: Self-pay | Admitting: Internal Medicine

## 2023-08-29 DIAGNOSIS — N1831 Chronic kidney disease, stage 3a: Secondary | ICD-10-CM

## 2023-08-29 DIAGNOSIS — E118 Type 2 diabetes mellitus with unspecified complications: Secondary | ICD-10-CM

## 2023-08-29 MED ORDER — SEMAGLUTIDE (1 MG/DOSE) 4 MG/3ML ~~LOC~~ SOPN: 1.0000 mg | PEN_INJECTOR | SUBCUTANEOUS | 0 refills | Status: DC

## 2023-08-29 MED ORDER — INSULIN PEN NEEDLE 32G X 6 MM MISC: 1.0000 | 1 refills | Status: DC

## 2023-09-10 LAB — HM DIABETES EYE EXAM

## 2023-09-17 ENCOUNTER — Other Ambulatory Visit: Payer: Self-pay | Admitting: Internal Medicine

## 2023-09-17 DIAGNOSIS — M069 Rheumatoid arthritis, unspecified: Secondary | ICD-10-CM

## 2023-09-18 ENCOUNTER — Other Ambulatory Visit: Payer: Self-pay | Admitting: Family

## 2023-09-19 ENCOUNTER — Other Ambulatory Visit: Payer: Self-pay | Admitting: Internal Medicine

## 2023-09-19 DIAGNOSIS — N1831 Chronic kidney disease, stage 3a: Secondary | ICD-10-CM

## 2023-09-19 DIAGNOSIS — E118 Type 2 diabetes mellitus with unspecified complications: Secondary | ICD-10-CM

## 2023-09-23 ENCOUNTER — Encounter: Payer: Self-pay | Admitting: Internal Medicine

## 2023-09-23 ENCOUNTER — Other Ambulatory Visit: Payer: Self-pay | Admitting: Internal Medicine

## 2023-09-23 DIAGNOSIS — M069 Rheumatoid arthritis, unspecified: Secondary | ICD-10-CM

## 2023-09-23 MED ORDER — LEFLUNOMIDE 20 MG PO TABS: 20.0000 mg | ORAL_TABLET | Freq: Every day | ORAL | 0 refills | Status: DC

## 2023-09-24 MED ORDER — ALLOPURINOL 300 MG PO TABS: 300.0000 mg | ORAL_TABLET | Freq: Every day | ORAL | 0 refills | Status: DC

## 2023-09-29 ENCOUNTER — Other Ambulatory Visit: Payer: Self-pay | Admitting: Internal Medicine

## 2023-09-29 DIAGNOSIS — N1831 Chronic kidney disease, stage 3a: Secondary | ICD-10-CM

## 2023-09-29 DIAGNOSIS — E118 Type 2 diabetes mellitus with unspecified complications: Secondary | ICD-10-CM

## 2023-09-30 MED ORDER — DAPAGLIFLOZIN PROPANEDIOL 10 MG PO TABS: 10.0000 mg | ORAL_TABLET | Freq: Every day | ORAL | 0 refills | Status: DC

## 2023-10-11 ENCOUNTER — Other Ambulatory Visit: Payer: Self-pay | Admitting: Internal Medicine

## 2023-10-11 ENCOUNTER — Encounter: Payer: Self-pay | Admitting: Internal Medicine

## 2023-10-11 DIAGNOSIS — F419 Anxiety disorder, unspecified: Secondary | ICD-10-CM

## 2023-10-11 DIAGNOSIS — K2100 Gastro-esophageal reflux disease with esophagitis, without bleeding: Secondary | ICD-10-CM

## 2023-10-14 MED ORDER — ESOMEPRAZOLE MAGNESIUM 40 MG PO CPDR: 40.0000 mg | DELAYED_RELEASE_CAPSULE | Freq: Every day | ORAL | 0 refills | Status: DC

## 2023-10-22 ENCOUNTER — Ambulatory Visit: Admitting: Family Medicine

## 2023-10-23 ENCOUNTER — Ambulatory Visit (INDEPENDENT_AMBULATORY_CARE_PROVIDER_SITE_OTHER): Admitting: Family Medicine

## 2023-10-23 ENCOUNTER — Other Ambulatory Visit: Payer: Self-pay | Admitting: Internal Medicine

## 2023-10-23 VITALS — BP 132/90 | HR 96 | Ht 64.0 in | Wt 230.0 lb

## 2023-10-23 DIAGNOSIS — R931 Abnormal findings on diagnostic imaging of heart and coronary circulation: Secondary | ICD-10-CM | POA: Diagnosis not present

## 2023-10-23 DIAGNOSIS — M7062 Trochanteric bursitis, left hip: Secondary | ICD-10-CM | POA: Diagnosis not present

## 2023-10-23 DIAGNOSIS — M7061 Trochanteric bursitis, right hip: Secondary | ICD-10-CM

## 2023-10-23 NOTE — Patient Instructions (Addendum)
 Injection in hips today Good to see you! See you again in 10 weeks

## 2023-10-23 NOTE — Progress Notes (Signed)
 [facility] [address] [place] [address] Phone: [phone] Subjective:   Emily Wheeler, am serving as a scribe for Dr. Ronnell Coins.  I'm seeing this patient by the request  of:  Arcadio Knuckles, MD  CC: Bilateral hip pain  [id number]  Emily Wheeler is a 68 y.o. female coming in with complaint of hip pain. Starting to really hurt.  Waking her up at night and affecting daily activities.  Still having pain more on the lateral aspect with less pain in the groin area.  Still ambulating with the aid of cane       Past Medical History:  Diagnosis Date   Anemia    mild   Anxiety    Arthritis    Asthma    Cardiomyopathy (HCC)    CHF (congestive heart failure) (HCC)    Diabetes mellitus without complication (HCC)    Frequency of urination    at night   GERD (gastroesophageal reflux disease)    potassium worsens reflux   Hemorrhoids, external    Hyperlipidemia    Hypertension    Neuromuscular disorder (HCC)    neuropathy   Numbness and tingling of both legs    Rheumatoid arthritis (HCC)    Vertigo    hx of   Past Surgical History:  Procedure Laterality Date   ABDOMINAL HYSTERECTOMY     CARDIAC CATHETERIZATION N/A 10/28/2014   Procedure: Left Heart Cath and Coronary Angiography;  Surgeon: Lucendia Rusk, MD;  Location: [facility] INVASIVE CV LAB;  Service: Cardiovascular;  Laterality: N/A;   CARPAL TUNNEL RELEASE Left    COLONOSCOPY     pt states 11 yr ago in [place] had a colon with 2 polyps- one cecal polyp per pt. one ? location   COLONOSCOPY W/ POLYPECTOMY     CYST REMOVAL NECK     HEMORROIDECTOMY     MAXIMUM ACCESS (MAS)POSTERIOR LUMBAR INTERBODY FUSION (PLIF) 3 LEVEL N/A 10/08/2013   Procedure: FOR MAXIMUM ACCESS (MAS) POSTERIOR LUMBAR INTERBODY FUSION (PLIF) 3 LEVEL;  Surgeon: Manya Sells, MD;  Location: [facility] NEURO ORS;  Service: Neurosurgery;  Laterality: N/A;  L3-4 L4-5 L5-S1 maximum access posterior lumbar interbody fusion  with decompression   RIGHT HEART CATH N/A 08/21/2023   Procedure: RIGHT HEART CATH;  Surgeon: Lauralee Poll, MD;  Location: [facility] INVASIVE CV LAB;  Service: Cardiovascular;  Laterality: N/A;   TUBAL LIGATION     Social History   Socioeconomic History   Marital status: Single    Spouse name: Not on file   Number of children: 2   Years of education: Not on file   Highest education level: 12th grade  Occupational History   Occupation: RETIRED  Tobacco Use   Smoking status: Former    Passive exposure: Current   Smokeless tobacco: Never  Vaping Use   Vaping status: Never Used  Substance and Sexual Activity   Alcohol use: Not Currently   Drug use: No    Types: Marijuana   Sexual activity: Not Currently  Other Topics Concern   Not on file  Social History Narrative   Lives alone/2025   Social Drivers of Health   Financial Resource Strain: High Risk (07/31/2023)   Overall Financial Resource Strain (CARDIA)    Difficulty of Paying Living Expenses: Hard  Food Insecurity: No Food Insecurity (07/31/2023)   Hunger Vital Sign    Worried About Running Out of Food in the Last Year: Never true  Ran Out of Food in the Last Year: Never true  Transportation Needs: No Transportation Needs (07/31/2023)   PRAPARE - Administrator, Civil Service (Medical): No    Lack of Transportation (Non-Medical): No  Physical Activity: Inactive (07/31/2023)   Exercise Vital Sign    Days of Exercise per Week: 0 days    Minutes of Exercise per Session: 0 min  Stress: No Stress Concern Present (07/31/2023)   [name] of Occupational Health - Occupational Stress Questionnaire    Feeling of Stress : Not at all  Social Connections: Moderately Isolated (07/31/2023)   Social Connection and Isolation Panel [NHANES]    Frequency of Communication with Friends and Family: More than three times a week    Frequency of Social Gatherings with Friends and Family: Once a week    Attends Religious Services:  1 to 4 times per year    Active Member of [employer] or Organizations: No    Attends Engineer, structural: Never    Marital Status: Never married   Allergies  Allergen Reactions   Lisinopril  Cough   Family History  Problem Relation Age of Onset   Early death Father    Heart disease Father    Hypertension Sister    Hypertension Brother    Diabetes Brother    Colon polyps Mother    Hashimoto's thyroiditis Sister    Colon cancer Maternal Grandmother        in her 57s   Stomach cancer Maternal Aunt    Non-Hodgkin's lymphoma Sister    Alcohol abuse Neg Hx    COPD Neg Hx    Depression Neg Hx    Drug abuse Neg Hx    Hearing loss Neg Hx    Hyperlipidemia Neg Hx    Kidney disease Neg Hx    Stroke Neg Hx    Esophageal cancer Neg Hx    Rectal cancer Neg Hx     Current Outpatient Medications (Endocrine & Metabolic):    dapagliflozin  propanediol (FARXIGA ) 10 MG TABS tablet, Take 1 tablet (10 mg total) by mouth daily before breakfast.   Semaglutide , 1 MG/DOSE, 4 MG/3ML SOPN, Inject 1 mg as directed once a week.  Current Outpatient Medications (Cardiovascular):    atorvastatin  (LIPITOR) 80 MG tablet, Take 1 tablet (80 mg total) by mouth daily.   carvedilol  (COREG ) 12.5 MG tablet, Take 1 tablet (12.5 mg total) by mouth 2 (two) times daily. TAKE AN EXTRA  1/2 TABLET 6.25 MG AS NEEDED FOR PALPITATIONS   furosemide  (LASIX ) 40 MG tablet, Take 1 tablet (40 mg total) by mouth as needed. For weight gain of 3lb in 24 hours, 5lb in a week   sacubitril -valsartan  (ENTRESTO ) 49-51 MG, Take 1 tablet by mouth 2 (two) times daily.  Current Outpatient Medications (Respiratory):    levalbuterol  (XOPENEX  HFA) 45 MCG/ACT inhaler, Inhale 2 puffs into the lungs every 8 (eight) hours as needed for wheezing.  Current Outpatient Medications (Analgesics):    allopurinol  (ZYLOPRIM ) 300 MG tablet, Take 1 tablet (300 mg total) by mouth daily.   aspirin  EC 81 MG tablet, Take 1 tablet (81 mg total) by mouth  daily.   leflunomide  (ARAVA ) 20 MG tablet, Take 1 tablet (20 mg total) by mouth daily.   Current Outpatient Medications (Other):    Accu-Chek FastClix Lancets MISC, USE 1  TO CHECK GLUCOSE THREE TIMES DAILY   Blood Glucose Monitoring Suppl (ACCU-CHEK GUIDE) w/Device KIT, 1 Act by Does not apply route 3 (three)  times daily.   calcium  carbonate (OS-CAL) 600 MG TABS tablet, Take 600 mg by mouth daily.   citalopram  (CELEXA ) 20 MG tablet, Take 1 tablet by mouth once daily   esomeprazole  (NEXIUM ) 40 MG capsule, Take 1 capsule (40 mg total) by mouth daily.   gabapentin  (NEURONTIN ) 600 MG tablet, Take 1 tablet (600 mg total) by mouth 3 (three) times daily. (Patient taking differently: Take 300 mg by mouth 3 (three) times daily.)   glucose blood (ACCU-CHEK GUIDE) test strip, 1 each by Other route 3 (three) times daily.   Insulin  Pen Needle 32G X 6 MM MISC, 1 Act by Does not apply route once a week.   Omega-3 Fatty Acids (FISH OIL) 500 MG CAPS, Take 500 mg by mouth daily.   Polyethylene Glycol 400 (BLINK TEARS) 0.25 % SOLN, Place 1 drop into both eyes daily as needed (Dry eyes).   Reviewed prior external information including notes and imaging from  primary care provider As well as notes that were available from care everywhere and other healthcare systems.  Past medical history, social, surgical and family history all reviewed in electronic medical record.  No pertanent information unless stated regarding to the chief complaint.   Review of Systems:  No headache, visual changes, nausea, vomiting, diarrhea, constipation, dizziness, abdominal pain, skin rash, fevers, chills, night sweats, weight loss, swollen lymph nodes, body aches, joint swelling, chest pain, shortness of breath, mood changes. POSITIVE muscle aches  Objective  Blood pressure (!) 132/90, pulse 96, height 5\' 4"  (1.626 m), weight 230 lb (104.3 kg), last menstrual period 05/29/1991, SpO2 98%.   General: No apparent distress alert and  oriented x3 mood and affect normal, dressed appropriately.  HEENT: Pupils equal, extraocular movements intact  Respiratory: Patient's speak in full sentences and does not appear short of breath  Cardiovascular: No lower extremity edema, non tender, no erythema  Low back exam shows patient does have some loss lordosis noted.  Some tenderness to palpation in the paraspinal musculature.  Bilateral hip exam shows patient does have tightness noted with severe tenderness over the greater trochanteric area bilaterally.    After verbal consent patient was prepped with alcohol swab and with a 21-gauge 2 inch needle injected into the right greater trochanteric area with 2 cc of 0.5% Marcaine  and 1 cc of Kenalog  40 mg/mL.  No blood loss.  Band-Aid placed.  Postinjection instructions given   After verbal consent patient was prepped with alcohol swab and with a 21-gauge 2 inch needle injected into the left greater trochanteric area with 2 cc of 0.5% Marcaine  and 1 cc of Kenalog  40 mg/mL.  No blood loss.  Band-Aid placed.  Postinjection instructions given   Impression and Recommendations:    The above documentation has been reviewed and is accurate and complete Mahogony Gilchrest M Dino Borntreger, DO

## 2023-10-23 NOTE — Assessment & Plan Note (Signed)
 Continues to be stable with most recent MRI of the cardiac.  Being followed by cardiology.  In 1 reason we cannot use anti-inflammatories.

## 2023-10-23 NOTE — Assessment & Plan Note (Signed)
 Chronic problem with exacerbation again.  Patient does have some lumbar pathology that is likely contributing to some of the radicular symptoms as well.  Discussed icing regimen and home exercises, discussed which activities to do and which ones to avoid.  Increase activity slowly.  Discussed icing regimen.  Follow-up again in 6 to 8 weeks otherwise.

## 2023-11-05 NOTE — Progress Notes (Unsigned)
 [facility] [address] [place] [address] Phone: [phone] Subjective:   IBryan Wheeler, am serving as a scribe for Dr. Ronnell Coins.  I'm seeing this patient by the request  of:  Arcadio Knuckles, MD  CC: shoulder pain   [id number]  10/23/2023 Continues to be stable with most recent MRI of the cardiac.  Being followed by cardiology.  In 1 reason we cannot use anti-inflammatories.     Chronic problem with exacerbation again.  Patient does have some lumbar pathology that is likely contributing to some of the radicular symptoms as well.  Discussed icing regimen and home exercises, discussed which activities to do and which ones to avoid.  Increase activity slowly.  Discussed icing regimen.  Follow-up again in 6 to 8 weeks otherwise.     Updated 11/06/2023 Emily Wheeler is a 68 y.o. female coming in with complaint of shoulder pain. Having L shoulder. Pain doing into neck.       Past Medical History:  Diagnosis Date   Anemia    mild   Anxiety    Arthritis    Asthma    Cardiomyopathy (HCC)    CHF (congestive heart failure) (HCC)    Diabetes mellitus without complication (HCC)    Frequency of urination    at night   GERD (gastroesophageal reflux disease)    potassium worsens reflux   Hemorrhoids, external    Hyperlipidemia    Hypertension    Neuromuscular disorder (HCC)    neuropathy   Numbness and tingling of both legs    Rheumatoid arthritis (HCC)    Vertigo    hx of   Past Surgical History:  Procedure Laterality Date   ABDOMINAL HYSTERECTOMY     CARDIAC CATHETERIZATION N/A 10/28/2014   Procedure: Left Heart Cath and Coronary Angiography;  Surgeon: Lucendia Rusk, MD;  Location: [facility] INVASIVE CV LAB;  Service: Cardiovascular;  Laterality: N/A;   CARPAL TUNNEL RELEASE Left    COLONOSCOPY     pt states 11 yr ago in [place] had a colon with 2 polyps- one cecal polyp per pt. one ? location   COLONOSCOPY W/ POLYPECTOMY      CYST REMOVAL NECK     HEMORROIDECTOMY     MAXIMUM ACCESS (MAS)POSTERIOR LUMBAR INTERBODY FUSION (PLIF) 3 LEVEL N/A 10/08/2013   Procedure: FOR MAXIMUM ACCESS (MAS) POSTERIOR LUMBAR INTERBODY FUSION (PLIF) 3 LEVEL;  Surgeon: Manya Sells, MD;  Location: [facility] NEURO ORS;  Service: Neurosurgery;  Laterality: N/A;  L3-4 L4-5 L5-S1 maximum access posterior lumbar interbody fusion with decompression   RIGHT HEART CATH N/A 08/21/2023   Procedure: RIGHT HEART CATH;  Surgeon: Lauralee Poll, MD;  Location: [facility] INVASIVE CV LAB;  Service: Cardiovascular;  Laterality: N/A;   TUBAL LIGATION     Social History   Socioeconomic History   Marital status: Single    Spouse name: Not on file   Number of children: 2   Years of education: Not on file   Highest education level: 12th grade  Occupational History   Occupation: RETIRED  Tobacco Use   Smoking status: Former    Passive exposure: Current   Smokeless tobacco: Never  Vaping Use   Vaping status: Never Used  Substance and Sexual Activity   Alcohol use: Not Currently   Drug use: No    Types: Marijuana   Sexual activity: Not Currently  Other Topics Concern   Not on file  Social History Narrative  Lives alone/2025   Social Drivers of Health   Financial Resource Strain: High Risk (07/31/2023)   Overall Financial Resource Strain (CARDIA)    Difficulty of Paying Living Expenses: Hard  Food Insecurity: No Food Insecurity (07/31/2023)   Hunger Vital Sign    Worried About Running Out of Food in the Last Year: Never true    Ran Out of Food in the Last Year: Never true  Transportation Needs: No Transportation Needs (07/31/2023)   PRAPARE - Administrator, Civil Service (Medical): No    Lack of Transportation (Non-Medical): No  Physical Activity: Inactive (07/31/2023)   Exercise Vital Sign    Days of Exercise per Week: 0 days    Minutes of Exercise per Session: 0 min  Stress: No Stress Concern Present (07/31/2023)   [name] of  Occupational Health - Occupational Stress Questionnaire    Feeling of Stress : Not at all  Social Connections: Moderately Isolated (07/31/2023)   Social Connection and Isolation Panel [NHANES]    Frequency of Communication with Friends and Family: More than three times a week    Frequency of Social Gatherings with Friends and Family: Once a week    Attends Religious Services: 1 to 4 times per year    Active Member of [employer] or Organizations: No    Attends Engineer, structural: Never    Marital Status: Never married   Allergies  Allergen Reactions   Lisinopril  Cough   Family History  Problem Relation Age of Onset   Early death Father    Heart disease Father    Hypertension Sister    Hypertension Brother    Diabetes Brother    Colon polyps Mother    Hashimoto's thyroiditis Sister    Colon cancer Maternal Grandmother        in her 58s   Stomach cancer Maternal Aunt    Non-Hodgkin's lymphoma Sister    Alcohol abuse Neg Hx    COPD Neg Hx    Depression Neg Hx    Drug abuse Neg Hx    Hearing loss Neg Hx    Hyperlipidemia Neg Hx    Kidney disease Neg Hx    Stroke Neg Hx    Esophageal cancer Neg Hx    Rectal cancer Neg Hx     Current Outpatient Medications (Endocrine & Metabolic):    dapagliflozin  propanediol (FARXIGA ) 10 MG TABS tablet, Take 1 tablet (10 mg total) by mouth daily before breakfast.   Semaglutide , 1 MG/DOSE, 4 MG/3ML SOPN, Inject 1 mg as directed once a week.  Current Outpatient Medications (Cardiovascular):    atorvastatin  (LIPITOR) 80 MG tablet, Take 1 tablet (80 mg total) by mouth daily.   carvedilol  (COREG ) 12.5 MG tablet, Take 1 tablet (12.5 mg total) by mouth 2 (two) times daily. TAKE AN EXTRA  1/2 TABLET 6.25 MG AS NEEDED FOR PALPITATIONS   furosemide  (LASIX ) 40 MG tablet, Take 1 tablet (40 mg total) by mouth as needed. For weight gain of 3lb in 24 hours, 5lb in a week   sacubitril -valsartan  (ENTRESTO ) 49-51 MG, Take 1 tablet by mouth 2 (two) times  daily.  Current Outpatient Medications (Respiratory):    levalbuterol  (XOPENEX  HFA) 45 MCG/ACT inhaler, Inhale 2 puffs into the lungs every 8 (eight) hours as needed for wheezing.  Current Outpatient Medications (Analgesics):    allopurinol  (ZYLOPRIM ) 300 MG tablet, Take 1 tablet (300 mg total) by mouth daily.   aspirin  EC 81 MG tablet, Take 1 tablet (81  mg total) by mouth daily.   leflunomide  (ARAVA ) 20 MG tablet, Take 1 tablet (20 mg total) by mouth daily.   Current Outpatient Medications (Other):    Accu-Chek FastClix Lancets MISC, USE 1  TO CHECK GLUCOSE THREE TIMES DAILY   Blood Glucose Monitoring Suppl (ACCU-CHEK GUIDE) w/Device KIT, 1 Act by Does not apply route 3 (three) times daily.   calcium  carbonate (OS-CAL) 600 MG TABS tablet, Take 600 mg by mouth daily.   citalopram  (CELEXA ) 20 MG tablet, Take 1 tablet by mouth once daily   esomeprazole  (NEXIUM ) 40 MG capsule, Take 1 capsule (40 mg total) by mouth daily.   gabapentin  (NEURONTIN ) 600 MG tablet, Take 0.5 tablets (300 mg total) by mouth 3 (three) times daily.   glucose blood (ACCU-CHEK GUIDE) test strip, 1 each by Other route 3 (three) times daily.   Insulin  Pen Needle 32G X 6 MM MISC, 1 Act by Does not apply route once a week.   Omega-3 Fatty Acids (FISH OIL) 500 MG CAPS, Take 500 mg by mouth daily.   Polyethylene Glycol 400 (BLINK TEARS) 0.25 % SOLN, Place 1 drop into both eyes daily as needed (Dry eyes).   Reviewed prior external information including notes and imaging from  primary care provider As well as notes that were available from care everywhere and other healthcare systems.  Past medical history, social, surgical and family history all reviewed in electronic medical record.  No pertanent information unless stated regarding to the chief complaint.   Review of Systems:  No headache, visual changes, nausea, vomiting, diarrhea, constipation, dizziness, abdominal pain, skin rash, fevers, chills, night sweats, weight  loss, swollen lymph nodes, body aches, joint swelling, chest pain, shortness of breath, mood changes. POSITIVE muscle aches  Objective  Pulse 97, height 5' 4 (1.626 m), last menstrual period 05/29/1991, SpO2 99%.   General: No apparent distress alert and oriented x3 mood and affect normal, dressed appropriately.  HEENT: Pupils equal, extraocular movements intact  Respiratory: Patient's speak in full sentences and does not appear short of breath  Cardiovascular: No lower extremity edema, non tender, no erythema  Shoulder exam shows significant trigger points noted.  Arthritic changes of multiple other joints noted today.   After verbal consent patient was prepped with alcohol swab and with a 25-gauge 1 inch needle injected into 4 distinct trigger points in the levator scapula, trapezius and rhomboid muscles.  Total of 3 cc of 0.5% Marcaine  and 1 cc of Kenalog  40 mg/mL used.  Minimal blood loss.  Band-Aids placed.  Injection instructions given.   Impression and Recommendations:    The above documentation has been reviewed and is accurate and complete Clarke Amburn M Elli Groesbeck, DO

## 2023-11-06 ENCOUNTER — Ambulatory Visit (INDEPENDENT_AMBULATORY_CARE_PROVIDER_SITE_OTHER): Admitting: Family Medicine

## 2023-11-06 ENCOUNTER — Other Ambulatory Visit: Payer: Self-pay

## 2023-11-06 ENCOUNTER — Ambulatory Visit (INDEPENDENT_AMBULATORY_CARE_PROVIDER_SITE_OTHER)

## 2023-11-06 VITALS — HR 97 | Ht 64.0 in

## 2023-11-06 DIAGNOSIS — M542 Cervicalgia: Secondary | ICD-10-CM

## 2023-11-06 DIAGNOSIS — M47812 Spondylosis without myelopathy or radiculopathy, cervical region: Secondary | ICD-10-CM | POA: Diagnosis not present

## 2023-11-06 DIAGNOSIS — M25512 Pain in left shoulder: Secondary | ICD-10-CM | POA: Diagnosis not present

## 2023-11-06 NOTE — Assessment & Plan Note (Signed)
 Patient given injection and tolerated the procedure well, discussed icing regimen of home exercises, patient differential includes cervical radiculopathy and patient has had no known arthritic changes for quite some time of the neck.  Discussed icing regimen and home exercises.  Increase activity slowly.  Follow-up again in 6 to 8 weeks.  Scapular exercises given today as well.  X-rays of the cervical spine ordered today as well.  Will monitor blood sugars closely for the next 3 days.  Discussed icing regimen and home exercises.  Increase activity slowly.  Follow-up again in 6 to 8 weeks.

## 2023-11-06 NOTE — Patient Instructions (Addendum)
 Good to see you! Xray today Do prescribed exercises at least 3x a week  See you again at next appointment

## 2023-11-08 ENCOUNTER — Other Ambulatory Visit (HOSPITAL_COMMUNITY): Payer: Self-pay | Admitting: Cardiology

## 2023-11-08 ENCOUNTER — Other Ambulatory Visit: Payer: Self-pay | Admitting: Internal Medicine

## 2023-11-08 DIAGNOSIS — N1831 Chronic kidney disease, stage 3a: Secondary | ICD-10-CM

## 2023-11-08 DIAGNOSIS — E118 Type 2 diabetes mellitus with unspecified complications: Secondary | ICD-10-CM

## 2023-11-13 ENCOUNTER — Ambulatory Visit: Payer: Self-pay | Admitting: Family Medicine

## 2023-11-16 ENCOUNTER — Other Ambulatory Visit: Payer: Self-pay | Admitting: Physician Assistant

## 2023-11-16 DIAGNOSIS — I10 Essential (primary) hypertension: Secondary | ICD-10-CM

## 2023-11-16 DIAGNOSIS — I255 Ischemic cardiomyopathy: Secondary | ICD-10-CM

## 2023-11-19 DIAGNOSIS — H524 Presbyopia: Secondary | ICD-10-CM | POA: Diagnosis not present

## 2023-11-19 DIAGNOSIS — E119 Type 2 diabetes mellitus without complications: Secondary | ICD-10-CM | POA: Diagnosis not present

## 2023-11-25 ENCOUNTER — Other Ambulatory Visit: Payer: Self-pay

## 2023-11-25 DIAGNOSIS — I10 Essential (primary) hypertension: Secondary | ICD-10-CM

## 2023-11-25 DIAGNOSIS — I255 Ischemic cardiomyopathy: Secondary | ICD-10-CM

## 2023-11-25 MED ORDER — CARVEDILOL 12.5 MG PO TABS: 12.5000 mg | ORAL_TABLET | Freq: Two times a day (BID) | ORAL | 1 refills | Status: DC

## 2023-12-02 ENCOUNTER — Other Ambulatory Visit: Payer: Self-pay | Admitting: Internal Medicine

## 2023-12-02 DIAGNOSIS — E118 Type 2 diabetes mellitus with unspecified complications: Secondary | ICD-10-CM

## 2023-12-02 DIAGNOSIS — N1831 Chronic kidney disease, stage 3a: Secondary | ICD-10-CM

## 2023-12-17 ENCOUNTER — Encounter: Payer: Self-pay | Admitting: Internal Medicine

## 2023-12-17 ENCOUNTER — Other Ambulatory Visit: Payer: Self-pay | Admitting: Internal Medicine

## 2023-12-17 DIAGNOSIS — M069 Rheumatoid arthritis, unspecified: Secondary | ICD-10-CM

## 2023-12-18 ENCOUNTER — Other Ambulatory Visit: Payer: Self-pay | Admitting: Internal Medicine

## 2023-12-18 DIAGNOSIS — M069 Rheumatoid arthritis, unspecified: Secondary | ICD-10-CM

## 2023-12-18 MED ORDER — ALLOPURINOL 300 MG PO TABS: 300.0000 mg | ORAL_TABLET | Freq: Every day | ORAL | 0 refills | Status: DC

## 2023-12-18 MED ORDER — LEFLUNOMIDE 20 MG PO TABS: 20.0000 mg | ORAL_TABLET | Freq: Every day | ORAL | 0 refills | Status: DC

## 2023-12-18 NOTE — Telephone Encounter (Signed)
 Copied from CRM #[id number]. Topic: Clinical - Prescription Issue >> Dec 18, 2023  1:04 PM Drema MATSU wrote: Reason for CRM: Patient scheduled an appointment for medication refill for August 13th. She is wanting a refill on allopurinol  (ZYLOPRIM ) 300 MG tablet and leflunomide  (ARAVA ) 20 MG tablet because she is out.

## 2023-12-29 ENCOUNTER — Other Ambulatory Visit: Payer: Self-pay | Admitting: Internal Medicine

## 2023-12-29 DIAGNOSIS — E118 Type 2 diabetes mellitus with unspecified complications: Secondary | ICD-10-CM

## 2023-12-29 DIAGNOSIS — N1831 Chronic kidney disease, stage 3a: Secondary | ICD-10-CM

## 2024-01-01 ENCOUNTER — Ambulatory Visit: Admitting: Family Medicine

## 2024-01-01 ENCOUNTER — Other Ambulatory Visit: Payer: Self-pay | Admitting: Internal Medicine

## 2024-01-01 ENCOUNTER — Encounter: Payer: Self-pay | Admitting: Family Medicine

## 2024-01-01 VITALS — BP 110/70 | HR 110 | Ht 64.0 in | Wt 224.0 lb

## 2024-01-01 DIAGNOSIS — N1831 Chronic kidney disease, stage 3a: Secondary | ICD-10-CM

## 2024-01-01 DIAGNOSIS — M7061 Trochanteric bursitis, right hip: Secondary | ICD-10-CM

## 2024-01-01 DIAGNOSIS — E118 Type 2 diabetes mellitus with unspecified complications: Secondary | ICD-10-CM

## 2024-01-01 DIAGNOSIS — M7062 Trochanteric bursitis, left hip: Secondary | ICD-10-CM

## 2024-01-01 DIAGNOSIS — M170 Bilateral primary osteoarthritis of knee: Secondary | ICD-10-CM

## 2024-01-01 NOTE — Assessment & Plan Note (Signed)
 Left knee injected today on August 6.  Responded very well previously.  Hopefully this will make significant improvement.  Discussed which activities to do and which ones to avoid.  Increase activity slowly.  Discussed icing regimen.  Follow-up again in 6 to 8 weeks.

## 2024-01-01 NOTE — Patient Instructions (Signed)
 Injection in hips today Injection in L knee See you again in 8 weeks

## 2024-01-01 NOTE — Assessment & Plan Note (Signed)
 Given injections, discussed trying to weight 10 weeks in between injections if possible.  Continue to stay active otherwise.  Discussed icing regimen, discussed home exercises, increase activity slowly.  Follow-up again in 6 to 8 weeks.

## 2024-01-01 NOTE — Progress Notes (Signed)
 [facility] [address] [place] [address] Phone: [phone] Subjective:   [name], am serving as a scribe for Dr. Arthea Claudene.  I'm seeing this patient by the request  of:  Joshua Debby CROME, MD  CC:   YEP:Dlagzrupcz  11/06/2023 Patient given injection and tolerated the procedure well, discussed icing regimen of home exercises, patient differential includes cervical radiculopathy and patient has had no known arthritic changes for quite some time of the neck.  Discussed icing regimen and home exercises.  Increase activity slowly.  Follow-up again in 6 to 8 weeks.  Scapular exercises given today as well.  X-rays of the cervical spine ordered today as well.  Will monitor blood sugars closely for the next 3 days.  Discussed icing regimen and home exercises.  Increase activity slowly.  Follow-up again in 6 to 8 weeks.     Updated 01/01/2024 Emily Wheeler is a 68 y.o. female coming in with complaint of hip pain. Injections in hips. Wants to take a look at L knee. Worsening pain, making it difficult to increase activity.     Past Medical History:  Diagnosis Date   Anemia    mild   Anxiety    Arthritis    Asthma    Cardiomyopathy (HCC)    CHF (congestive heart failure) (HCC)    Diabetes mellitus without complication (HCC)    Frequency of urination    at night   GERD (gastroesophageal reflux disease)    potassium worsens reflux   Hemorrhoids, external    Hyperlipidemia    Hypertension    Neuromuscular disorder (HCC)    neuropathy   Numbness and tingling of both legs    Rheumatoid arthritis (HCC)    Vertigo    hx of   Past Surgical History:  Procedure Laterality Date   ABDOMINAL HYSTERECTOMY     CARDIAC CATHETERIZATION N/A 10/28/2014   Procedure: Left Heart Cath and Coronary Angiography;  Surgeon: Candyce GORMAN Reek, MD;  Location: [facility] INVASIVE CV LAB;  Service: Cardiovascular;  Laterality: N/A;   CARPAL TUNNEL RELEASE Left     COLONOSCOPY     pt states 11 yr ago in [place] had a colon with 2 polyps- one cecal polyp per pt. one ? location   COLONOSCOPY W/ POLYPECTOMY     CYST REMOVAL NECK     HEMORROIDECTOMY     MAXIMUM ACCESS (MAS)POSTERIOR LUMBAR INTERBODY FUSION (PLIF) 3 LEVEL N/A 10/08/2013   Procedure: FOR MAXIMUM ACCESS (MAS) POSTERIOR LUMBAR INTERBODY FUSION (PLIF) 3 LEVEL;  Surgeon: Fairy Levels, MD;  Location: [facility] NEURO ORS;  Service: Neurosurgery;  Laterality: N/A;  L3-4 L4-5 L5-S1 maximum access posterior lumbar interbody fusion with decompression   RIGHT HEART CATH N/A 08/21/2023   Procedure: RIGHT HEART CATH;  Surgeon: [name] Morene PARAS, MD;  Location: [facility] INVASIVE CV LAB;  Service: Cardiovascular;  Laterality: N/A;   TUBAL LIGATION     Social History   Socioeconomic History   Marital status: Single    Spouse name: Not on file   Number of children: 2   Years of education: Not on file   Highest education level: 12th grade  Occupational History   Occupation: RETIRED  Tobacco Use   Smoking status: Former    Passive exposure: Current   Smokeless tobacco: Never  Vaping Use   Vaping status: Never Used  Substance and Sexual Activity   Alcohol use: Not Currently   Drug use: No  Types: Marijuana   Sexual activity: Not Currently  Other Topics Concern   Not on file  Social History Narrative   Lives alone/2025   Social Drivers of Health   Financial Resource Strain: High Risk (07/31/2023)   Overall Financial Resource Strain (CARDIA)    Difficulty of Paying Living Expenses: Hard  Food Insecurity: No Food Insecurity (07/31/2023)   Hunger Vital Sign    Worried About Running Out of Food in the Last Year: Never true    Ran Out of Food in the Last Year: Never true  Transportation Needs: No Transportation Needs (07/31/2023)   PRAPARE - Administrator, Civil Service (Medical): No    Lack of Transportation (Non-Medical): No  Physical Activity: Inactive (07/31/2023)   Exercise Vital Sign    Days of  Exercise per Week: 0 days    Minutes of Exercise per Session: 0 min  Stress: No Stress Concern Present (07/31/2023)   [name] of Occupational Health - Occupational Stress Questionnaire    Feeling of Stress : Not at all  Social Connections: Moderately Isolated (07/31/2023)   Social Connection and Isolation Panel    Frequency of Communication with Friends and Family: More than three times a week    Frequency of Social Gatherings with Friends and Family: Once a week    Attends Religious Services: 1 to 4 times per year    Active Member of [employer] or Organizations: No    Attends Engineer, structural: Never    Marital Status: Never married   Allergies  Allergen Reactions   Lisinopril  Cough   Family History  Problem Relation Age of Onset   Early death Father    Heart disease Father    Hypertension Sister    Hypertension Brother    Diabetes Brother    Colon polyps Mother    Hashimoto's thyroiditis Sister    Colon cancer Maternal Grandmother        in her 45s   Stomach cancer Maternal Aunt    Non-Hodgkin's lymphoma Sister    Alcohol abuse Neg Hx    COPD Neg Hx    Depression Neg Hx    Drug abuse Neg Hx    Hearing loss Neg Hx    Hyperlipidemia Neg Hx    Kidney disease Neg Hx    Stroke Neg Hx    Esophageal cancer Neg Hx    Rectal cancer Neg Hx     Current Outpatient Medications (Endocrine & Metabolic):    FARXIGA  10 MG TABS tablet, TAKE 1 TABLET BY MOUTH ONCE DAILY BEFORE BREAKFAST   OZEMPIC , 1 MG/DOSE, 4 MG/3ML SOPN, INJECT 1 MG AS DIRECTED ONCE A WEEK  Current Outpatient Medications (Cardiovascular):    atorvastatin  (LIPITOR) 80 MG tablet, Take 1 tablet (80 mg total) by mouth daily.   carvedilol  (COREG ) 12.5 MG tablet, Take 1 tablet (12.5 mg total) by mouth 2 (two) times daily. TAKE AN EXTRA  1/2 TABLET 6.25 MG AS NEEDED FOR PALPITATIONS   furosemide  (LASIX ) 40 MG tablet, Take 1 tablet (40 mg total) by mouth as needed. For weight gain of 3lb in 24 hours, 5lb in a  week   sacubitril -valsartan  (ENTRESTO ) 49-51 MG, Take 1 tablet by mouth 2 (two) times daily.  Current Outpatient Medications (Respiratory):    levalbuterol  (XOPENEX  HFA) 45 MCG/ACT inhaler, Inhale 2 puffs into the lungs every 8 (eight) hours as needed for wheezing.  Current Outpatient Medications (Analgesics):    allopurinol  (ZYLOPRIM ) 300 MG tablet, Take 1  tablet (300 mg total) by mouth daily.   aspirin  EC 81 MG tablet, Take 1 tablet (81 mg total) by mouth daily.   leflunomide  (ARAVA ) 20 MG tablet, Take 1 tablet (20 mg total) by mouth daily.   Current Outpatient Medications (Other):    Accu-Chek FastClix Lancets MISC, USE 1  TO CHECK GLUCOSE THREE TIMES DAILY   Blood Glucose Monitoring Suppl (ACCU-CHEK GUIDE) w/Device KIT, 1 Act by Does not apply route 3 (three) times daily.   calcium  carbonate (OS-CAL) 600 MG TABS tablet, Take 600 mg by mouth daily.   citalopram  (CELEXA ) 20 MG tablet, Take 1 tablet by mouth once daily   esomeprazole  (NEXIUM ) 40 MG capsule, Take 1 capsule (40 mg total) by mouth daily.   gabapentin  (NEURONTIN ) 600 MG tablet, Take 0.5 tablets (300 mg total) by mouth 3 (three) times daily.   glucose blood (ACCU-CHEK GUIDE) test strip, 1 each by Other route 3 (three) times daily.   Omega-3 Fatty Acids (FISH OIL) 500 MG CAPS, Take 500 mg by mouth daily.   Polyethylene Glycol 400 (BLINK TEARS) 0.25 % SOLN, Place 1 drop into both eyes daily as needed (Dry eyes).   RELION PEN NEEDLES 32G X 4 MM MISC, USE 1  SUBCUTANEOUSLY ONCE A WEEK   Reviewed prior external information including notes and imaging from  primary care provider As well as notes that were available from care everywhere and other healthcare systems.  Past medical history, social, surgical and family history all reviewed in electronic medical record.  No pertanent information unless stated regarding to the chief complaint.   Review of Systems:  No headache, visual changes, nausea, vomiting, diarrhea,  constipation, dizziness, abdominal pain, skin rash, fevers, chills, night sweats, weight loss, swollen lymph nodes, body aches, joint swelling, chest pain, shortness of breath, mood changes. POSITIVE muscle aches  Objective  Blood pressure 110/70, pulse (!) 110, height 5' 4 (1.626 m), weight 224 lb (101.6 kg), last menstrual period 05/29/1991, SpO2 98%.   General: No apparent distress alert and oriented x3 mood and affect normal, dressed appropriately.  HEENT: Pupils equal, extraocular movements intact  Respiratory: Patient's speak in full sentences and does not appear short of breath  Cardiovascular: No lower extremity edema, non tender, no erythema  Patient does have severe antalgic gait noted.  Severe tenderness to palpation to palpation over the greater trochanteric area bilaterally.  Stiffness noted.  Positive FABER test noted.    After verbal consent patient was prepped with alcohol swab and with a 21-gauge 2 inch needle injected into the right greater trochanteric area with 2 cc of 0.5% Marcaine  and 1 cc of Kenalog  40 mg/mL.  No blood loss.  Band-Aid placed.  Postinjection instructions given   After verbal consent patient was prepped with alcohol swab and with a 21-gauge 2 inch needle injected into the left greater trochanteric area with 2 cc of 0.5% Marcaine  and 1 cc of Kenalog  40 mg/mL.  No blood loss.  Band-Aid placed.  Postinjection instructions given  After informed written and verbal consent, patient was seated on exam table. Left knee was prepped with alcohol swab and utilizing anterolateral approach, patient's left knee space was injected with 4:1  marcaine  0.5%: Kenalog  40mg /dL. Patient tolerated the procedure well without immediate complications.   Impression and Recommendations:    The above documentation has been reviewed and is accurate and complete Sanii Kukla M Willine Schwalbe, DO

## 2024-01-01 NOTE — Telephone Encounter (Signed)
 Last OV 07/22/23 Next OV 01/08/24  Last refill 09/30/23 Qty #90/0

## 2024-01-02 NOTE — Telephone Encounter (Signed)
 Last OV 07/22/23 Next OV 01/08/24  Last refill 01/01/24 Qty #90/0

## 2024-01-08 ENCOUNTER — Encounter: Payer: Self-pay | Admitting: Internal Medicine

## 2024-01-08 ENCOUNTER — Ambulatory Visit (INDEPENDENT_AMBULATORY_CARE_PROVIDER_SITE_OTHER): Admitting: Internal Medicine

## 2024-01-08 VITALS — BP 110/66 | HR 93 | Temp 98.1°F | Ht 64.0 in | Wt 221.2 lb

## 2024-01-08 DIAGNOSIS — I5020 Unspecified systolic (congestive) heart failure: Secondary | ICD-10-CM

## 2024-01-08 DIAGNOSIS — I10 Essential (primary) hypertension: Secondary | ICD-10-CM

## 2024-01-08 DIAGNOSIS — R0609 Other forms of dyspnea: Secondary | ICD-10-CM

## 2024-01-08 DIAGNOSIS — E118 Type 2 diabetes mellitus with unspecified complications: Secondary | ICD-10-CM | POA: Diagnosis not present

## 2024-01-08 DIAGNOSIS — D510 Vitamin B12 deficiency anemia due to intrinsic factor deficiency: Secondary | ICD-10-CM | POA: Diagnosis not present

## 2024-01-08 DIAGNOSIS — M1A09X0 Idiopathic chronic gout, multiple sites, without tophus (tophi): Secondary | ICD-10-CM | POA: Insufficient documentation

## 2024-01-08 DIAGNOSIS — N1832 Chronic kidney disease, stage 3b: Secondary | ICD-10-CM | POA: Diagnosis not present

## 2024-01-08 DIAGNOSIS — I255 Ischemic cardiomyopathy: Secondary | ICD-10-CM

## 2024-01-08 DIAGNOSIS — N1831 Chronic kidney disease, stage 3a: Secondary | ICD-10-CM | POA: Diagnosis not present

## 2024-01-08 DIAGNOSIS — E1122 Type 2 diabetes mellitus with diabetic chronic kidney disease: Secondary | ICD-10-CM

## 2024-01-08 LAB — BASIC METABOLIC PANEL WITH GFR
BUN: 22 mg/dL (ref 6–23)
CO2: 29 meq/L (ref 19–32)
Calcium: 8.9 mg/dL (ref 8.4–10.5)
Chloride: 103 meq/L (ref 96–112)
Creatinine, Ser: 1.48 mg/dL — ABNORMAL HIGH (ref 0.40–1.20)
GFR: 35.82 mL/min — ABNORMAL LOW (ref >60.00)
Glucose, Bld: 162 mg/dL — ABNORMAL HIGH (ref 70–99)
Potassium: 4 meq/L (ref 3.5–5.1)
Sodium: 140 meq/L (ref 135–145)

## 2024-01-08 LAB — CBC WITH DIFFERENTIAL/PLATELET
Basophils Absolute: 0 K/uL (ref 0.0–0.1)
Basophils Relative: 0.6 % (ref 0.0–3.0)
Eosinophils Absolute: 0.3 K/uL (ref 0.0–0.7)
Eosinophils Relative: 4.9 % (ref 0.0–5.0)
HCT: 36.3 % (ref 36.0–46.0)
Hemoglobin: 11.8 g/dL — ABNORMAL LOW (ref 12.0–15.0)
Lymphocytes Relative: 26.3 % (ref 12.0–46.0)
Lymphs Abs: 1.4 K/uL (ref 0.7–4.0)
MCHC: 32.4 g/dL (ref 30.0–36.0)
MCV: 99.5 fl (ref 78.0–100.0)
Monocytes Absolute: 0.8 K/uL (ref 0.1–1.0)
Monocytes Relative: 14 % — ABNORMAL HIGH (ref 3.0–12.0)
Neutro Abs: 3 K/uL (ref 1.4–7.7)
Neutrophils Relative %: 54.2 % (ref 43.0–77.0)
Platelets: 220 K/uL (ref 150.0–400.0)
RBC: 3.65 Mil/uL — ABNORMAL LOW (ref 3.87–5.11)
RDW: 16.4 % — ABNORMAL HIGH (ref 11.5–15.5)
WBC: 5.5 K/uL (ref 4.0–10.5)

## 2024-01-08 LAB — BRAIN NATRIURETIC PEPTIDE: Pro B Natriuretic peptide (BNP): 35 pg/mL (ref 0.0–100.0)

## 2024-01-08 LAB — VITAMIN B12: Vitamin B-12: 743 pg/mL (ref 211–911)

## 2024-01-08 LAB — HEMOGLOBIN A1C: Hgb A1c MFr Bld: 7.2 % — ABNORMAL HIGH (ref 4.6–6.5)

## 2024-01-08 LAB — TSH: TSH: 1.31 u[IU]/mL (ref 0.35–5.50)

## 2024-01-08 LAB — TROPONIN I (HIGH SENSITIVITY): High Sens Troponin I: 11 ng/L (ref 2–17)

## 2024-01-08 LAB — FOLATE: Folate: >23.4 ng/mL (ref >5.9)

## 2024-01-08 MED ORDER — KERENDIA 10 MG PO TABS: 1.0000 | ORAL_TABLET | Freq: Every day | ORAL | 0 refills | Status: AC

## 2024-01-08 NOTE — Patient Instructions (Signed)
 Chronic Kidney Disease in Adults: What to Know Chronic kidney disease (CKD) is when lasting damage happens to the kidneys slowly over time. The kidneys are two organs that do many important things in the body. These include: Taking waste and extra fluid out of the blood to make pee (urine). Making hormones. Keeping the right amount of fluids and chemicals in the body. A small amount of kidney damage may not cause problems. You must take steps to help keep the kidney damage from getting worse. A lot of damage may cause kidney failure. Kidney failure means the kidneys can no longer work right. What are the causes? Diabetes. High blood pressure. Diseases that affect the heart and blood vessels. Other kidney diseases. Diseases that affect the body's defense system (immune system). A problem with the flow of pee. This may be caused by: Kidney stones. Cancer. An enlarged prostate, in males. A kidney infection or urinary tract infection (UTI) that keeps coming back. What increases the risk? Getting older. The chances of having CKD increase with age. A family history of kidney disease or kidney failure. Having a disease caused by genes. Taking medicines that can harm the kidneys. Being near or having contact with harmful substances. Being very overweight. Using tobacco now or in the past. What are the signs or symptoms? Common symptoms of CKD include: Feeling very tired and having less energy. Swelling of the face, legs, ankles, or feet. Throwing up or feeling like you may throw up. Not wanting to eat as much as normal. Being confused or not able to focus. Twitches and cramps in the leg muscles or other muscles. Dry, itchy skin. Other symptoms may include: Shortness of breath. Trouble sleeping. Making less pee, or making more pee, especially at night. A taste of metal in your mouth. You may also become anemic. Anemia means there's not enough red blood cells in your blood. You may get  symptoms slowly. You may not notice them until the kidney damage gets very bad. How is this diagnosed? CKD may be diagnosed based on: Tests on your blood or pee. Imaging tests, like an ultrasound or a CT scan. A kidney biopsy. For this test, a sample of kidney tissue is removed to be looked at under a microscope. These tests will help to find out how serious the CKD is. How is this treated? Often, there's no cure for CKD. Treatment can help with symptoms and help keep the disease from getting worse. Treatment may include: Treating other problems that are causing your CKD or making it worse. Diet changes. You may need to: Avoid alcohol. Avoid foods that are high in salt, potassium, phosphorous, and protein. Taking medicines for symptoms and to help control other conditions. Dialysis. This treatment gets harmful waste out of your body. It may be needed if you have kidney failure. Follow these instructions at home: Medicines Take your medicines only as told. The amount of some medicines you take may need to be changed. Do not take any new medicines, vitamins, or supplements unless your health care provider says it's okay. These may make kidney damage worse. Lifestyle Do not smoke, vape, or use nicotine or tobacco. If you drink alcohol: Limit how much you have to: 0-1 drink a day if you're female. 0-2 drinks a day if you're female. Know how much alcohol is in your drink. In the [place] one drink is one 12 oz bottle of beer (355 mL), one 5 oz glass of wine (148 mL), or one 1 oz  glass of hard liquor (44 mL). Stay at a healthy weight. If you need help, ask your provider. General instructions  Eat and drink as told. Track your blood pressure at home. Tell your provider about any changes. If you have diabetes, track your blood sugar as told. Exercise at least 30 minutes a day, 5 days a week. Keep your shots (vaccinations) up to date. Keep all follow-up visits. Your provider may need to change  your treatments over time. Where to find support American Kidney Fund: [place].com.au Kidney School: kidneyschool.org American Association of Kidney Patients: https://www.miller-montoya.com/ Where to find more information National Kidney Foundation: kidney.org Centers for Disease Control and Prevention. To learn more: Go to DiningCalendar.de. Click "Search". Type "chronic kidney disease" in the search box. Contact a health care provider if: You have new symptoms. You get symptoms of end-stage kidney disease. These include: Headaches. Numbness in your hands or feet. Leg cramps. Easy bruising. Get help right away if: You have a fever. You make less pee than usual. You have pain or bleeding when you pee or poop. You have chest pain. You have shortness of breath. These symptoms may be an emergency. Call 911 right away. Do not wait to see if the symptoms will go away. Do not drive yourself to the hospital. This information is not intended to replace advice given to you by your health care provider. Make sure you discuss any questions you have with your health care provider. Document Revised: 03/26/2023 Document Reviewed: 11/16/2022 Elsevier Patient Education  2024 [employer].

## 2024-01-08 NOTE — Progress Notes (Signed)
 Subjective:  Patient ID: Emily Wheeler, female    DOB: March 05, 1956  Age: 68 y.o. MRN: [id number]  CC: Diabetes and Congestive Heart Failure   HPI Emily Wheeler presents for f/up ---  Discussed the use of AI scribe software for clinical note transcription with the patient, who gave verbal consent to proceed.  History of Present Illness Emily Wheeler is a 68 year old female with cardiomyopathy and osteoarthritis who presents with shortness of breath and back pain.  She experiences shortness of breath, particularly during physical activity, which she attributes to her cardiomyopathy. This symptom has been persistent for some time. No chest pain is present, but she occasionally coughs. Approximately five months ago, she underwent a heart test, after which she was told by the doctor that she is not a candidate for a heart transplant. She recalls being told that her heart is 'steady at two point five.'  She suffers from back pain due to osteoarthritis, which she describes as the most debilitating aspect of her condition. She feels limited in options for managing this pain.  She notes slight swelling in her legs and feet, with a history of fluid retention. She has lost three pounds in the past week, attributing this to fluid loss. She takes Lasix  as needed for swelling.  Her last eye exam was three months ago, revealing a slight worsening in vision, though it does not impact her daily activities and is not painful.  She recently consumed ice cream, which may have affected her blood sugar levels. She is due for an A1c test, as it has not been performed in the last five months.    Outpatient Medications Prior to Visit  Medication Sig Dispense Refill   Accu-Chek FastClix Lancets MISC USE 1  TO CHECK GLUCOSE THREE TIMES DAILY 306 each 0   allopurinol  (ZYLOPRIM ) 300 MG tablet Take 1 tablet (300 mg total) by mouth daily. 30 tablet 0   aspirin  EC 81 MG tablet Take 1 tablet (81 mg total) by  mouth daily. 90 tablet 1   atorvastatin  (LIPITOR) 80 MG tablet Take 1 tablet (80 mg total) by mouth daily. 90 tablet 3   Blood Glucose Monitoring Suppl (ACCU-CHEK GUIDE) w/Device KIT 1 Act by Does not apply route 3 (three) times daily. 2 kit 2   calcium  carbonate (OS-CAL) 600 MG TABS tablet Take 600 mg by mouth daily.     carvedilol  (COREG ) 12.5 MG tablet Take 1 tablet (12.5 mg total) by mouth 2 (two) times daily. TAKE AN EXTRA  1/2 TABLET 6.25 MG AS NEEDED FOR PALPITATIONS 225 tablet 1   citalopram  (CELEXA ) 20 MG tablet Take 1 tablet by mouth once daily 90 tablet 0   esomeprazole  (NEXIUM ) 40 MG capsule Take 1 capsule (40 mg total) by mouth daily. 90 capsule 0   FARXIGA  10 MG TABS tablet TAKE 1 TABLET BY MOUTH ONCE DAILY BEFORE BREAKFAST 90 tablet 0   furosemide  (LASIX ) 40 MG tablet Take 1 tablet (40 mg total) by mouth as needed. For weight gain of 3lb in 24 hours, 5lb in a week 90 tablet 3   gabapentin  (NEURONTIN ) 600 MG tablet Take 0.5 tablets (300 mg total) by mouth 3 (three) times daily. 270 tablet 0   glucose blood (ACCU-CHEK GUIDE) test strip 1 each by Other route 3 (three) times daily. 300 each 1   leflunomide  (ARAVA ) 20 MG tablet Take 1 tablet (20 mg total) by mouth daily. 30 tablet 0   levalbuterol  (XOPENEX  HFA)  45 MCG/ACT inhaler Inhale 2 puffs into the lungs every 8 (eight) hours as needed for wheezing. 3 each 3   Omega-3 Fatty Acids (FISH OIL) 500 MG CAPS Take 500 mg by mouth daily.     OZEMPIC , 1 MG/DOSE, 4 MG/3ML SOPN INJECT 1 MG AS DIRECTED ONCE A WEEK 9 mL 0   Polyethylene Glycol 400 (BLINK TEARS) 0.25 % SOLN Place 1 drop into both eyes daily as needed (Dry eyes).     RELION PEN NEEDLES 32G X 4 MM MISC USE 1  SUBCUTANEOUSLY ONCE A WEEK 50 each 0   sacubitril -valsartan  (ENTRESTO ) 49-51 MG Take 1 tablet by mouth 2 (two) times daily. 180 tablet 2   No facility-administered medications prior to visit.    ROS Review of Systems  Constitutional:  Negative for appetite change, chills,  diaphoresis, fatigue and fever.  Respiratory:  Positive for shortness of breath (DOE). Negative for cough, chest tightness and wheezing.   Cardiovascular:  Negative for chest pain, palpitations and leg swelling.  Gastrointestinal:  Negative for abdominal pain, diarrhea, nausea and vomiting.  Endocrine: Negative.   Genitourinary:  Negative for difficulty urinating and dysuria.  Musculoskeletal:  Positive for arthralgias, back pain and gait problem. Negative for myalgias.  Neurological:  Negative for dizziness, weakness, light-headedness and numbness.  Hematological:  Negative for adenopathy. Does not bruise/bleed easily.  Psychiatric/Behavioral:  Positive for confusion and decreased concentration. Negative for suicidal ideas. The patient is not nervous/anxious.   All other systems reviewed and are negative.   Objective:  BP 110/66 (BP Location: Left Arm, Patient Position: Sitting, Cuff Size: Normal)   Pulse 93   Temp 98.1 F (36.7 C) (Oral)   Ht 5' 4 (1.626 m)   Wt 221 lb 3.2 oz (100.3 kg)   LMP 05/29/1991   SpO2 97%   BMI 37.97 kg/m   BP Readings from Last 3 Encounters:  01/08/24 110/66  01/01/24 110/70  10/23/23 (!) 132/90    Wt Readings from Last 3 Encounters:  01/08/24 221 lb 3.2 oz (100.3 kg)  01/01/24 224 lb (101.6 kg)  10/23/23 230 lb (104.3 kg)    Physical Exam Vitals reviewed.  Constitutional:      General: She is not in acute distress.    Appearance: She is not toxic-appearing or diaphoretic.  HENT:     Mouth/Throat:     Mouth: Mucous membranes are moist.  Eyes:     General: No scleral icterus.    Conjunctiva/sclera: Conjunctivae normal.  Cardiovascular:     Rate and Rhythm: Normal rate and regular rhythm. Frequent Extrasystoles are present.    Heart sounds: No murmur heard.    No gallop.     Comments: EKG--- SA with frequent PVC's (new), 90 bpm +++artifact Anterior infarct pattern is not new LAD Pulmonary:     Effort: Pulmonary effort is normal.      Breath sounds: No stridor. No wheezing, rhonchi or rales.  Abdominal:     General: Abdomen is protuberant. Bowel sounds are normal. There is no distension.     Palpations: Abdomen is soft. There is no hepatomegaly, splenomegaly or mass.     Tenderness: There is no abdominal tenderness.  Musculoskeletal:     Cervical back: Neck supple.     Right lower leg: No edema.     Left lower leg: No edema.  Skin:    General: Skin is warm and dry.  Neurological:     General: No focal deficit present.     Mental  Status: She is alert. Mental status is at baseline.  Psychiatric:        Mood and Affect: Mood normal.        Behavior: Behavior normal.     Lab Results  Component Value Date   WBC 5.5 01/08/2024   HGB 11.8 (L) 01/08/2024   HCT 36.3 01/08/2024   PLT 220.0 01/08/2024   GLUCOSE 162 (H) 01/08/2024   CHOL 153 02/26/2023   TRIG 164.0 (H) 02/26/2023   HDL 44.80 02/26/2023   LDLDIRECT 162.5 01/30/2013   LDLCALC 76 02/26/2023   ALT 21 07/01/2023   AST 22 07/01/2023   NA 140 01/08/2024   K 4.0 01/08/2024   CL 103 01/08/2024   CREATININE 1.48 (H) 01/08/2024   BUN 22 01/08/2024   CO2 29 01/08/2024   TSH 1.31 01/08/2024   INR 0.9 08/19/2018   HGBA1C 7.2 (H) 01/08/2024    CARDIAC CATHETERIZATION Result Date: 08/21/2023 HEMODYNAMICS: RA:       5 mmHg (mean) RV:       25/3, 6 mmHg PA:       22/8 mmHg (15 mean) PCWP: 8 mmHg (mean)    Estimated Fick CO/CI   4.93L/min, 2.41L/min/m2 Thermodilution CO/CI   4.86L/min, 2.37L/min/m2    TPG  7  mmHg     PVR  1.4 Wood Units PAPi  2.8  IMPRESSION: Right heart catheterization for evaluation of worsening heart failure symptoms. Normal left and right heart filling pressures. Normal cardiac output by thermodilution. Normal pulmonary artery pressures. RECOMMENDATIONS: Preserved cardiac index, continue to advance medical therapy, consider cardiac rehab.    Assessment & Plan:  Type II diabetes mellitus with manifestations (HCC)- Blood sugar is well  controlled. -     Basic metabolic panel with GFR; Future -     Hemoglobin A1c; Future  Stage 3a chronic kidney disease (HCC) -     Basic metabolic panel with GFR; Future  DOE (dyspnea on exertion)- EKG and labs are reassuring. -     EKG 12-Lead -     CBC with Differential/Platelet; Future -     Troponin I (High Sensitivity); Future -     Brain natriuretic peptide; Future  Vitamin B12 deficiency anemia due to intrinsic factor deficiency -     CBC with Differential/Platelet; Future -     Vitamin B12; Future -     Folate; Future  Essential hypertension, benign -     TSH; Future  Type 2 diabetes mellitus with stage 3b chronic kidney disease, without long-term current use of insulin  (HCC)- Renal function has declined. Will add an MRA. -     Kerendia ; Take 1 tablet (10 mg total) by mouth daily.  Dispense: 90 tablet; Refill: 0 -     AMB Referral [facility]  Ischemic cardiomyopathy -     Kerendia ; Take 1 tablet (10 mg total) by mouth daily.  Dispense: 90 tablet; Refill: 0 -     AMB Referral [facility]  Heart failure with reduced ejection fraction (HCC) -     Kerendia ; Take 1 tablet (10 mg total) by mouth daily.  Dispense: 90 tablet; Refill: 0 -     AMB Referral [facility]     Follow-up: Return in about 4 months (around 05/09/2024).  Debby Molt, MD

## 2024-01-09 ENCOUNTER — Telehealth: Payer: Self-pay

## 2024-01-09 NOTE — Telephone Encounter (Signed)
 Pharmacy Patient Advocate Encounter   Received notification from CoverMyMeds that prior authorization for Kerendia  10MG  tablets  is required/requested.   Insurance verification completed.   The patient is insured through [facility] .   Per test claim: PA required; PA submitted to above mentioned insurance via Latent Key/confirmation #/EOC [id number] Status is pending

## 2024-01-10 ENCOUNTER — Other Ambulatory Visit (HOSPITAL_COMMUNITY): Payer: Self-pay

## 2024-01-10 NOTE — Telephone Encounter (Signed)
 Pharmacy Patient Advocate Encounter  Received notification from [employer] that Prior Authorization for Kerendia  10MG  tablets  has been APPROVED from 01/10/2024 to 05/27/2024. Ran test claim, Copay is $0.00. This test claim was processed through [facility]- copay amounts may vary at other pharmacies due to pharmacy/plan contracts, or as the patient moves through the different stages of their insurance plan.   [id number]. KERENDIA  TAB 10MG  is approved through 05/27/2024

## 2024-01-10 NOTE — Telephone Encounter (Signed)
 Patient has been made aware and gave a verbal understanding.

## 2024-01-13 ENCOUNTER — Ambulatory Visit: Payer: Self-pay | Admitting: Internal Medicine

## 2024-01-22 ENCOUNTER — Telehealth: Payer: Self-pay | Admitting: *Deleted

## 2024-01-22 ENCOUNTER — Other Ambulatory Visit: Payer: Self-pay | Admitting: Internal Medicine

## 2024-01-22 DIAGNOSIS — F419 Anxiety disorder, unspecified: Secondary | ICD-10-CM

## 2024-01-22 NOTE — Progress Notes (Unsigned)
 Care Guide Pharmacy Note  01/22/2024 Name: Emily Wheeler MRN: [id number] DOB: Mar 11, 1956  Referred By: Joshua Debby CROME, MD Reason for referral: Call Attempt #1 and Complex Care Management (Outreach to schedule referral with pharmacist )   Emily Wheeler is a 68 y.o. year old female who is a primary care patient of Joshua, Debby CROME, MD.  Emily Wheeler was referred to the pharmacist for assistance related to: DMII  An unsuccessful telephone outreach was attempted today to contact the patient who was referred to the pharmacy team for assistance with medication management. Additional attempts will be made to contact the patient.  Thedford Franks, CMA [place]  [facility], [facility] Guide Direct Dial: [phone]  Fax: [phone] Website: [email or web address]

## 2024-01-23 NOTE — Progress Notes (Signed)
 Care Guide Pharmacy Note  01/23/2024 Name: Emily Wheeler MRN: [id number] DOB: 04-14-1956  Referred By: Joshua Debby CROME, MD Reason for referral: Call Attempt #1 and Complex Care Management (Outreach to schedule referral with pharmacist )   Emily Wheeler is a 68 y.o. year old female who is a primary care patient of Joshua Debby CROME, MD.  Emily Wheeler was referred to the pharmacist for assistance related to: DMII  Successful contact was made with the patient to discuss pharmacy services including being ready for the pharmacist to call at least 5 minutes before the scheduled appointment time and to have medication bottles and any blood pressure readings ready for review. The patient agreed to meet with the pharmacist via telephone visit on 02/06/2024  Emily Wheeler, CMA [place]  [facility], [facility] Guide Direct Dial: [phone]  Fax: [phone] Website: [email or web address]

## 2024-01-27 ENCOUNTER — Other Ambulatory Visit: Payer: Self-pay | Admitting: Internal Medicine

## 2024-01-27 DIAGNOSIS — M069 Rheumatoid arthritis, unspecified: Secondary | ICD-10-CM

## 2024-01-31 ENCOUNTER — Other Ambulatory Visit: Payer: Self-pay | Admitting: Internal Medicine

## 2024-01-31 DIAGNOSIS — E118 Type 2 diabetes mellitus with unspecified complications: Secondary | ICD-10-CM

## 2024-01-31 DIAGNOSIS — N1831 Chronic kidney disease, stage 3a: Secondary | ICD-10-CM

## 2024-02-03 DIAGNOSIS — N1832 Chronic kidney disease, stage 3b: Secondary | ICD-10-CM | POA: Diagnosis not present

## 2024-02-05 ENCOUNTER — Other Ambulatory Visit: Payer: Self-pay | Admitting: Internal Medicine

## 2024-02-05 ENCOUNTER — Other Ambulatory Visit (INDEPENDENT_AMBULATORY_CARE_PROVIDER_SITE_OTHER): Admitting: Pharmacist

## 2024-02-05 DIAGNOSIS — N1832 Chronic kidney disease, stage 3b: Secondary | ICD-10-CM

## 2024-02-05 DIAGNOSIS — M069 Rheumatoid arthritis, unspecified: Secondary | ICD-10-CM

## 2024-02-05 DIAGNOSIS — E1122 Type 2 diabetes mellitus with diabetic chronic kidney disease: Secondary | ICD-10-CM

## 2024-02-05 NOTE — Progress Notes (Signed)
 02/05/2024 Name: Emily Wheeler MRN: [id number] DOB: 1955/07/03  Chief Complaint  Patient presents with   Diabetes   Chronic Kidney Disease   Medication Management    Emily Wheeler is a 68 y.o. year old female who presented for a telephone visit.   They were referred to the pharmacist by their PCP for assistance in managing diabetes and CKD.    Subjective:  Care Team: Primary Care Provider: Joshua Debby CROME, MD ; Next Scheduled Visit: none scheduled   Medication Access/Adherence  Current Pharmacy:  [facility] [address] [place]), [place] - [address] [place]) [place] [address] Phone: [phone] Fax: [phone]   Patient reports affordability concerns with their medications: No  Patient reports access/transportation concerns to their pharmacy: No  Patient reports adherence concerns with their medications:  No     Diabetes:  Current medications: Farxiga  10 mg daily, Ozempic  1 mg weekly Medications tried in the past: Trulicity  (changed to Ozempic  for CKD benefit), Invokana , metformin   Current meal patterns: pt notes her A1c was likely elevated due to eating a lot more sweets than usual during a week long vacation and then several birthday parties.  She notes she is back to regular eating habits   Macrovascular and Microvascular Risk Reduction:  Statin? yes (atorvastatin  80 mg); ACEi/ARB? No - previously on ARB but experienced hypotension Last urinary albumin /creatinine ratio:  Lab Results  Component Value Date   MICRALBCREAT 45 08/02/2023   Last eye exam:  Lab Results  Component Value Date   HMDIABEYEEXA No Retinopathy 09/10/2023   Last foot exam: 03/07/2023 Tobacco Use:  Tobacco Use: Medium Risk (01/08/2024)   Patient History    Smoking Tobacco Use: Former    Smokeless Tobacco Use: Never    Passive Exposure: Current     Objective:  Lab Results  Component Value Date   HGBA1C 7.2 (H) 01/08/2024    Lab  Results  Component Value Date   CREATININE 1.48 (H) 01/08/2024   BUN 22 01/08/2024   NA 140 01/08/2024   K 4.0 01/08/2024   CL 103 01/08/2024   CO2 29 01/08/2024    Lab Results  Component Value Date   CHOL 153 02/26/2023   HDL 44.80 02/26/2023   LDLCALC 76 02/26/2023   LDLDIRECT 162.5 01/30/2013   TRIG 164.0 (H) 02/26/2023   CHOLHDL 3 02/26/2023    Medications Reviewed Today     Reviewed by Emily Wheeler, RPH (Pharmacist) on 02/05/24 at 1604  Med List Status: <None>   Medication Order Taking? Sig Documenting Provider Last Dose Status Informant  Accu-Chek FastClix Lancets MISC [id number]  USE 1  TO CHECK GLUCOSE THREE TIMES DAILY Emily Debby CROME, MD  Active Self  allopurinol  (ZYLOPRIM ) 300 MG tablet [id number]  Take 1 tablet by mouth once daily Emily Debby CROME, MD  Active   aspirin  EC 81 MG tablet [id number]  Take 1 tablet (81 mg total) by mouth daily. Emily Debby CROME, MD  Active Self  atorvastatin  (LIPITOR) 80 MG tablet [id number] Yes Take 1 tablet (80 mg total) by mouth daily. Emily Wheeler  Active Self  Blood Glucose Monitoring Suppl (ACCU-CHEK GUIDE) w/Device KIT [id number]  1 Act by Does not apply route 3 (three) times daily. Emily Debby CROME, MD  Active Self  calcium  carbonate (OS-CAL) 600 MG TABS tablet [id number]  Take 600 mg by mouth daily. [provider]  Active Self  carvedilol  (COREG ) 12.5 MG tablet [id number]  Take 1 tablet (12.5 mg total) by mouth 2 (two) times daily. TAKE AN EXTRA  1/2 TABLET 6.25 MG AS NEEDED FOR PALPITATIONS Emily Wheeler  Active   citalopram  (CELEXA ) 20 MG tablet [id number]  Take 1 tablet by mouth once daily Emily Debby CROME, MD  Active   esomeprazole  (NEXIUM ) 40 MG capsule [id number]  Take 1 capsule (40 mg total) by mouth daily. Emily Debby CROME, MD  Active   FARXIGA  10 MG TABS tablet [id number] Yes TAKE 1 TABLET BY MOUTH ONCE DAILY BEFORE Emily Wheeler Emily Debby CROME, MD  Active   Finerenone  (KERENDIA ) 10 MG TABS [id number] Yes Take 1  tablet (10 mg total) by mouth daily. Emily Debby CROME, MD  Active   furosemide  (LASIX ) 40 MG tablet [id number]  Take 1 tablet (40 mg total) by mouth as needed. For weight gain of 3lb in 24 hours, 5lb in a week Emily Morene PARAS, MD  Expired 01/08/24 2359 Self  gabapentin  (NEURONTIN ) 600 MG tablet [id number]  Take 0.5 tablets (300 mg total) by mouth 3 (three) times daily. Emily Debby CROME, MD  Active   glucose blood (ACCU-CHEK GUIDE) test strip [id number]  1 each by Other route 3 (three) times daily. Emily Debby CROME, MD  Active Self  leflunomide  (ARAVA ) 20 MG tablet [id number]  Take 1 tablet by mouth once daily Emily Debby CROME, MD  Active   levalbuterol  (XOPENEX  HFA) 45 MCG/ACT inhaler [id number]  Inhale 2 puffs into the lungs every 8 (eight) hours as needed for wheezing. Emily Debby CROME, MD  Active Self  Omega-3 Fatty Acids (FISH OIL) 500 MG CAPS [id number]  Take 500 mg by mouth daily. [provider]  Active Self  OZEMPIC , 1 MG/DOSE, 4 MG/3ML SOPN [id number] Yes INJECT 1 MG INTO THE SKIN AS DIRECTED ONCE A WEEK Emily Debby CROME, MD  Active   Polyethylene Glycol 400 (BLINK TEARS) 0.25 % SOLN [id number]  Place 1 drop into both eyes daily as needed (Dry eyes). [provider]  Active Self  RELION PEN NEEDLES 32G X 4 MM MISC [id number]  USE 1  SUBCUTANEOUSLY ONCE A WEEK Emily Debby CROME, MD  Active   sacubitril -valsartan  (ENTRESTO ) 49-51 MG [id number]  Take 1 tablet by mouth 2 (two) times daily.  Patient not taking: Reported on 02/05/2024   Emily Wheeler  Active Self              Assessment/Plan:   Diabetes: - Currently uncontrolled; goal A1c <7%. Cardiorenal risk reduction is optimized.. Blood pressure is at goal <130/80. LDL is not at goal.  - Reviewed long term cardiovascular and renal outcomes of uncontrolled blood sugar. - Recommend to continue current regimen and focus on dietary modifications. - A1c f/u in December w/ PCP follow up - Lipid panel due, last LDL slightly above  goal however atorvastatin  was increased since then - Pt recently started Kerendia  for renal protection. She had labs drawn yesterday for her nephrologist appt tomorrow. Advised pt to be sure to notify them she started Kerendia  1 month ago.  Follow Up Plan: PRN - December PCP f/u  Darrelyn Drum, PharmD, BCPS, CPP Clinical Pharmacist Practitioner [facility] [phone]

## 2024-02-05 NOTE — Patient Instructions (Addendum)
 It was a pleasure speaking with you today!  - Recommend to continue current regimen and focus on dietary modifications. - Schedule follow up with Dr. Joshua for December - Be sure to notify your kidney doctor that you started Kerendia  1 month ago  Feel free to call with any questions or concerns!  Darrelyn Drum, PharmD, BCPS, CPP Clinical Pharmacist Practitioner [place] [facility] [phone]

## 2024-02-06 DIAGNOSIS — I5020 Unspecified systolic (congestive) heart failure: Secondary | ICD-10-CM | POA: Diagnosis not present

## 2024-02-06 DIAGNOSIS — I129 Hypertensive chronic kidney disease with stage 1 through stage 4 chronic kidney disease, or unspecified chronic kidney disease: Secondary | ICD-10-CM | POA: Diagnosis not present

## 2024-02-06 DIAGNOSIS — N1832 Chronic kidney disease, stage 3b: Secondary | ICD-10-CM | POA: Diagnosis not present

## 2024-02-06 DIAGNOSIS — E785 Hyperlipidemia, unspecified: Secondary | ICD-10-CM | POA: Diagnosis not present

## 2024-02-06 DIAGNOSIS — D631 Anemia in chronic kidney disease: Secondary | ICD-10-CM | POA: Diagnosis not present

## 2024-02-06 DIAGNOSIS — N2581 Secondary hyperparathyroidism of renal origin: Secondary | ICD-10-CM | POA: Diagnosis not present

## 2024-02-06 DIAGNOSIS — E1122 Type 2 diabetes mellitus with diabetic chronic kidney disease: Secondary | ICD-10-CM | POA: Diagnosis not present

## 2024-02-06 LAB — LAB REPORT - SCANNED
Albumin, Urine POC: 52.8
Albumin/Creatinine Ratio, Urine, POC: 26
Creatinine, POC: 203.8 mg/dL
EGFR: 33

## 2024-02-18 ENCOUNTER — Other Ambulatory Visit: Payer: Self-pay | Admitting: Internal Medicine

## 2024-02-25 ENCOUNTER — Other Ambulatory Visit: Payer: Self-pay | Admitting: Internal Medicine

## 2024-02-25 DIAGNOSIS — M069 Rheumatoid arthritis, unspecified: Secondary | ICD-10-CM

## 2024-02-26 ENCOUNTER — Other Ambulatory Visit: Payer: Self-pay | Admitting: Internal Medicine

## 2024-02-26 DIAGNOSIS — K2100 Gastro-esophageal reflux disease with esophagitis, without bleeding: Secondary | ICD-10-CM

## 2024-03-10 ENCOUNTER — Encounter: Payer: Self-pay | Admitting: Internal Medicine

## 2024-03-10 NOTE — Progress Notes (Unsigned)
 Subjective:    Patient ID: Emily Wheeler, female    DOB: March 15, 1956, 68 y.o.   MRN: [id number]      HPI Emily Wheeler is here for No chief complaint on file.        Medications and allergies reviewed with patient and updated if appropriate.  Current Outpatient Medications on File Prior to Visit  Medication Sig Dispense Refill   Accu-Chek FastClix Lancets MISC USE 1  TO CHECK GLUCOSE THREE TIMES DAILY 306 each 0   allopurinol  (ZYLOPRIM ) 300 MG tablet Take 1 tablet by mouth once daily 30 tablet 0   aspirin  EC 81 MG tablet Take 1 tablet (81 mg total) by mouth daily. 90 tablet 1   atorvastatin  (LIPITOR) 80 MG tablet Take 1 tablet (80 mg total) by mouth daily. 90 tablet 3   Blood Glucose Monitoring Suppl (ACCU-CHEK GUIDE) w/Device KIT 1 Act by Does not apply route 3 (three) times daily. 2 kit 2   calcium  carbonate (OS-CAL) 600 MG TABS tablet Take 600 mg by mouth daily.     carvedilol  (COREG ) 12.5 MG tablet Take 1 tablet (12.5 mg total) by mouth 2 (two) times daily. TAKE AN EXTRA  1/2 TABLET 6.25 MG AS NEEDED FOR PALPITATIONS 225 tablet 1   citalopram  (CELEXA ) 20 MG tablet Take 1 tablet by mouth once daily 90 tablet 0   esomeprazole  (NEXIUM ) 40 MG capsule Take 1 capsule by mouth once daily 90 capsule 0   FARXIGA  10 MG TABS tablet TAKE 1 TABLET BY MOUTH ONCE DAILY BEFORE BREAKFAST 90 tablet 0   Finerenone  (KERENDIA ) 10 MG TABS Take 1 tablet (10 mg total) by mouth daily. 90 tablet 0   furosemide  (LASIX ) 40 MG tablet Take 1 tablet (40 mg total) by mouth as needed. For weight gain of 3lb in 24 hours, 5lb in a week 90 tablet 3   gabapentin  (NEURONTIN ) 600 MG tablet Take 0.5 tablets (300 mg total) by mouth 3 (three) times daily. 270 tablet 0   glucose blood (ACCU-CHEK GUIDE) test strip 1 each by Other route 3 (three) times daily. 300 each 1   leflunomide  (ARAVA ) 20 MG tablet Take 1 tablet by mouth once daily 30 tablet 0   levalbuterol  (XOPENEX  HFA) 45 MCG/ACT inhaler Inhale 2 puffs into the  lungs every 8 (eight) hours as needed for wheezing. 3 each 3   Omega-3 Fatty Acids (FISH OIL) 500 MG CAPS Take 500 mg by mouth daily.     OZEMPIC , 1 MG/DOSE, 4 MG/3ML SOPN INJECT 1 MG INTO THE SKIN AS DIRECTED ONCE A WEEK 9 mL 0   Polyethylene Glycol 400 (BLINK TEARS) 0.25 % SOLN Place 1 drop into both eyes daily as needed (Dry eyes).     RELION PEN NEEDLES 32G X 4 MM MISC USE 1  SUBCUTANEOUSLY ONCE A WEEK 50 each 0   sacubitril -valsartan  (ENTRESTO ) 49-51 MG Take 1 tablet by mouth 2 (two) times daily. (Patient not taking: Reported on 02/05/2024) 180 tablet 2   No current facility-administered medications on file prior to visit.    Review of Systems     Objective:  There were no vitals filed for this visit. BP Readings from Last 3 Encounters:  01/08/24 110/66  01/01/24 110/70  10/23/23 (!) 132/90   Wt Readings from Last 3 Encounters:  01/08/24 221 lb 3.2 oz (100.3 kg)  01/01/24 224 lb (101.6 kg)  10/23/23 230 lb (104.3 kg)   There is no height or weight on file to calculate  BMI.    Physical Exam         Assessment & Plan:    See Problem List for Assessment and Plan of chronic medical problems.

## 2024-03-11 ENCOUNTER — Ambulatory Visit (INDEPENDENT_AMBULATORY_CARE_PROVIDER_SITE_OTHER): Admitting: Internal Medicine

## 2024-03-11 ENCOUNTER — Ambulatory Visit: Payer: Self-pay | Admitting: Internal Medicine

## 2024-03-11 VITALS — BP 114/70 | HR 68 | Temp 97.7°F | Ht 64.0 in | Wt 222.0 lb

## 2024-03-11 DIAGNOSIS — E1122 Type 2 diabetes mellitus with diabetic chronic kidney disease: Secondary | ICD-10-CM | POA: Diagnosis not present

## 2024-03-11 DIAGNOSIS — R21 Rash and other nonspecific skin eruption: Secondary | ICD-10-CM

## 2024-03-11 DIAGNOSIS — Z7984 Long term (current) use of oral hypoglycemic drugs: Secondary | ICD-10-CM

## 2024-03-11 DIAGNOSIS — Z7985 Long-term (current) use of injectable non-insulin antidiabetic drugs: Secondary | ICD-10-CM

## 2024-03-11 DIAGNOSIS — N1832 Chronic kidney disease, stage 3b: Secondary | ICD-10-CM

## 2024-03-11 LAB — COMPREHENSIVE METABOLIC PANEL WITH GFR
ALT: 15 U/L (ref 0–35)
AST: 18 U/L (ref 0–37)
Albumin: 4 g/dL (ref 3.5–5.2)
Alkaline Phosphatase: 66 U/L (ref 39–117)
BUN: 21 mg/dL (ref 6–23)
CO2: 29 meq/L (ref 19–32)
Calcium: 9 mg/dL (ref 8.4–10.5)
Chloride: 104 meq/L (ref 96–112)
Creatinine, Ser: 1.47 mg/dL — ABNORMAL HIGH (ref 0.40–1.20)
GFR: 36.07 mL/min — ABNORMAL LOW (ref >60.00)
Glucose, Bld: 140 mg/dL — ABNORMAL HIGH (ref 70–99)
Potassium: 4.1 meq/L (ref 3.5–5.1)
Sodium: 141 meq/L (ref 135–145)
Total Bilirubin: 0.5 mg/dL (ref 0.2–1.2)
Total Protein: 6.4 g/dL (ref 6.0–8.3)

## 2024-03-11 LAB — CBC WITH DIFFERENTIAL/PLATELET
Basophils Absolute: 0 K/uL (ref 0.0–0.1)
Basophils Relative: 0.8 % (ref 0.0–3.0)
Eosinophils Absolute: 0.4 K/uL (ref 0.0–0.7)
Eosinophils Relative: 9.8 % — ABNORMAL HIGH (ref 0.0–5.0)
HCT: 33.9 % — ABNORMAL LOW (ref 36.0–46.0)
Hemoglobin: 11 g/dL — ABNORMAL LOW (ref 12.0–15.0)
Lymphocytes Relative: 41.4 % (ref 12.0–46.0)
Lymphs Abs: 1.8 K/uL (ref 0.7–4.0)
MCHC: 32.5 g/dL (ref 30.0–36.0)
MCV: 101.8 fl — ABNORMAL HIGH (ref 78.0–100.0)
Monocytes Absolute: 0.5 K/uL (ref 0.1–1.0)
Monocytes Relative: 12.1 % — ABNORMAL HIGH (ref 3.0–12.0)
Neutro Abs: 1.6 K/uL (ref 1.4–7.7)
Neutrophils Relative %: 35.9 % — ABNORMAL LOW (ref 43.0–77.0)
Platelets: 200 K/uL (ref 150.0–400.0)
RBC: 3.34 Mil/uL — ABNORMAL LOW (ref 3.87–5.11)
RDW: 16.3 % — ABNORMAL HIGH (ref 11.5–15.5)
WBC: 4.3 K/uL (ref 4.0–10.5)

## 2024-03-11 MED ORDER — PREDNISONE 20 MG PO TABS: 20.0000 mg | ORAL_TABLET | Freq: Every day | ORAL | 0 refills | Status: AC

## 2024-03-11 MED ORDER — LEVOCETIRIZINE DIHYDROCHLORIDE 5 MG PO TABS: 5.0000 mg | ORAL_TABLET | Freq: Every evening | ORAL | 0 refills | Status: AC

## 2024-03-11 NOTE — Patient Instructions (Addendum)
      Blood work was ordered.       Medications changes include :   xyzal ( anti-histamine ) - take a night and prednisone 20 mg daily x 5 days     Return if symptoms worsen or fail to improve.

## 2024-03-13 ENCOUNTER — Other Ambulatory Visit: Payer: Self-pay | Admitting: Physician Assistant

## 2024-03-13 DIAGNOSIS — I5020 Unspecified systolic (congestive) heart failure: Secondary | ICD-10-CM

## 2024-03-18 ENCOUNTER — Other Ambulatory Visit: Payer: Self-pay | Admitting: Internal Medicine

## 2024-03-18 DIAGNOSIS — M069 Rheumatoid arthritis, unspecified: Secondary | ICD-10-CM

## 2024-03-19 ENCOUNTER — Telehealth (HOSPITAL_COMMUNITY): Payer: Self-pay | Admitting: Cardiology

## 2024-03-20 ENCOUNTER — Other Ambulatory Visit: Payer: Self-pay | Admitting: Physician Assistant

## 2024-03-20 DIAGNOSIS — I5020 Unspecified systolic (congestive) heart failure: Secondary | ICD-10-CM

## 2024-03-22 ENCOUNTER — Other Ambulatory Visit: Payer: Self-pay | Admitting: Internal Medicine

## 2024-03-22 DIAGNOSIS — I5020 Unspecified systolic (congestive) heart failure: Secondary | ICD-10-CM

## 2024-03-27 ENCOUNTER — Encounter (HOSPITAL_COMMUNITY): Payer: Self-pay | Admitting: Cardiology

## 2024-03-27 ENCOUNTER — Ambulatory Visit (HOSPITAL_COMMUNITY)
Admission: RE | Admit: 2024-03-27 | Discharge: 2024-03-27 | Disposition: A | Discharge status: Home / Self Care | Source: Ambulatory Visit | Attending: Cardiology | Admitting: Cardiology

## 2024-03-27 VITALS — BP 90/58 | HR 84 | Ht 64.0 in | Wt 225.2 lb

## 2024-03-27 DIAGNOSIS — K2100 Gastro-esophageal reflux disease with esophagitis, without bleeding: Secondary | ICD-10-CM

## 2024-03-27 DIAGNOSIS — K219 Gastro-esophageal reflux disease without esophagitis: Secondary | ICD-10-CM | POA: Diagnosis not present

## 2024-03-27 DIAGNOSIS — R0602 Shortness of breath: Secondary | ICD-10-CM | POA: Diagnosis not present

## 2024-03-27 MED ORDER — ESOMEPRAZOLE MAGNESIUM 40 MG PO CPDR: 40.0000 mg | DELAYED_RELEASE_CAPSULE | Freq: Two times a day (BID) | ORAL | 0 refills | Status: DC

## 2024-03-27 NOTE — Patient Instructions (Signed)
 Medication Changes:  INCREASE NEXIUM  TO 40MG  TWICE DAILY   Referrals:  YOU HAVE BEEN REFERRED TO PULMONARY REHAB THEY WILL REACH OUT TO YOU OR CALL TO ARRANGE THIS. PLEASE CALL US  WITH ANY CONCERNS   YOU HAVE BEEN REFERRED TO GI THEY WILL REACH OUT TO YOU OR CALL TO ARRANGE THIS. PLEASE CALL US  WITH ANY CONCERNS   Follow-Up in: 6 MONTHS WITH DR. [name] PLEASE CALL OUR OFFICE AROUND MARCH 2026 TO GET SCHEDULED FOR YOUR APPOINTMENT. PHONE NUMBER IS [phone] OPTION 2   At the [facility], you and your health needs are our priority. We have a designated team specialized in the treatment of Heart Failure. This Care Team includes your primary Heart Failure Specialized Cardiologist (physician), Advanced Practice Providers (APPs- Physician Assistants and Nurse Practitioners), and Pharmacist who all work together to provide you with the care you need, when you need it.   You may see any of the following providers on your designated Care Team at your next follow up:  Dr. Toribio Fuel Dr. Ezra Shuck Dr. Odis [name] Greig Mosses, NP Caffie Shed, [place] [facility] [place], [place] Beckey Coe, NP Jordan Lee, NP Tinnie Redman, PharmD   Please be sure to bring in all your medications bottles to every appointment.   Need to Contact Us :  If you have any questions or concerns before your next appointment please send us  a message through [facility] or call our office at [phone].    TO LEAVE A MESSAGE FOR THE NURSE SELECT OPTION 2, PLEASE LEAVE A MESSAGE INCLUDING: YOUR NAME DATE OF BIRTH CALL BACK NUMBER REASON FOR CALL**this is important as we prioritize the call backs  YOU WILL RECEIVE A CALL BACK THE SAME DAY AS LONG AS YOU CALL BEFORE 4:00 PM

## 2024-03-31 NOTE — Progress Notes (Unsigned)
 [facility] [address] [place] [address] Phone: [phone] Subjective:   Emily Wheeler, am serving as a scribe for Dr. Arthea Wheeler.  I'm seeing this patient by the request  of:  Emily Debby CROME, MD  CC: Bilateral hip and bilateral knee pain  YEP:Dlagzrupcz  01/01/2024 Given injections, discussed trying to weight 10 weeks in between injections if possible.  Continue to stay active otherwise.  Discussed icing regimen, discussed home exercises, increase activity slowly.  Follow-up again in 6 to 8 weeks.     Left knee injected today on August 6. Responded very well previously. Hopefully this will make significant improvement. Discussed which activities to do and which ones to avoid. Increase activity slowly. Discussed icing regimen. Follow-up again in 6 to 8 weeks.   Update 04/02/2024 Emily Wheeler is a 68 y.o. female coming in with complaint of B hip and B knee pain. Patient states continues to have pain in the lower extremities.  Making it more difficult to walk on a regular basis.  Trying to lose weight but finds it difficult.  Starting to have more discomfort even with daily activities.    Past Medical History:  Diagnosis Date   Anemia    mild   Anxiety    Arthritis    Asthma    Cardiomyopathy (HCC)    CHF (congestive heart failure) (HCC)    Diabetes mellitus without complication (HCC)    Frequency of urination    at night   GERD (gastroesophageal reflux disease)    potassium worsens reflux   Hemorrhoids, external    Hyperlipidemia    Hypertension    Neuromuscular disorder (HCC)    neuropathy   Numbness and tingling of both legs    Rheumatoid arthritis (HCC)    Vertigo    hx of   Past Surgical History:  Procedure Laterality Date   ABDOMINAL HYSTERECTOMY     CARDIAC CATHETERIZATION N/A 10/28/2014   Procedure: Left Heart Cath and Coronary Angiography;  Surgeon: Candyce GORMAN Reek, MD;  Location: [facility] INVASIVE CV LAB;  Service:  Cardiovascular;  Laterality: N/A;   CARPAL TUNNEL RELEASE Left    COLONOSCOPY     pt states 11 yr ago in [place] had a colon with 2 polyps- one cecal polyp per pt. one ? location   COLONOSCOPY W/ POLYPECTOMY     CYST REMOVAL NECK     HEMORROIDECTOMY     MAXIMUM ACCESS (MAS)POSTERIOR LUMBAR INTERBODY FUSION (PLIF) 3 LEVEL N/A 10/08/2013   Procedure: FOR MAXIMUM ACCESS (MAS) POSTERIOR LUMBAR INTERBODY FUSION (PLIF) 3 LEVEL;  Surgeon: Fairy Levels, MD;  Location: [facility] NEURO ORS;  Service: Neurosurgery;  Laterality: N/A;  L3-4 L4-5 L5-S1 maximum access posterior lumbar interbody fusion with decompression   RIGHT HEART CATH N/A 08/21/2023   Procedure: RIGHT HEART CATH;  Surgeon: [name] Morene PARAS, MD;  Location: [facility] INVASIVE CV LAB;  Service: Cardiovascular;  Laterality: N/A;   TUBAL LIGATION     Social History   Socioeconomic History   Marital status: Single    Spouse name: Not on file   Number of children: 2   Years of education: Not on file   Highest education level: GED or equivalent  Occupational History   Occupation: RETIRED  Tobacco Use   Smoking status: Former    Passive exposure: Current   Smokeless tobacco: Never  Vaping Use   Vaping status: Never Used  Substance and Sexual Activity   Alcohol use: Not  Currently   Drug use: No    Types: Marijuana   Sexual activity: Not Currently  Other Topics Concern   Not on file  Social History Narrative   Lives alone/2025   Social Drivers of Health   Financial Resource Strain: Medium Risk (01/06/2024)   Overall Financial Resource Strain (CARDIA)    Difficulty of Paying Living Expenses: Somewhat hard  Food Insecurity: No Food Insecurity (01/06/2024)   Hunger Vital Sign    Worried About Running Out of Food in the Last Year: Never true    Ran Out of Food in the Last Year: Never true  Transportation Needs: No Transportation Needs (01/06/2024)   PRAPARE - Administrator, Civil Service (Medical): No    Lack of Transportation  (Non-Medical): No  Physical Activity: Inactive (01/06/2024)   Exercise Vital Sign    Days of Exercise per Week: 0 days    Minutes of Exercise per Session: Not on file  Stress: No Stress Concern Present (01/06/2024)   [name] of Occupational Health - Occupational Stress Questionnaire    Feeling of Stress: Not at all  Social Connections: Moderately Isolated (01/06/2024)   Social Connection and Isolation Panel    Frequency of Communication with Friends and Family: More than three times a week    Frequency of Social Gatherings with Friends and Family: Once a week    Attends Religious Services: Patient declined    Database Administrator or Organizations: Yes    Attends Engineer, Structural: Patient declined    Marital Status: Never married   Allergies  Allergen Reactions   Lisinopril  Cough   Family History  Problem Relation Age of Onset   Early death Father    Heart disease Father    Hypertension Sister    Hypertension Brother    Diabetes Brother    Colon polyps Mother    Hashimoto's thyroiditis Sister    Colon cancer Maternal Grandmother        in her 42s   Stomach cancer Maternal Aunt    Non-Hodgkin's lymphoma Sister    Alcohol abuse Neg Hx    COPD Neg Hx    Depression Neg Hx    Drug abuse Neg Hx    Hearing loss Neg Hx    Hyperlipidemia Neg Hx    Kidney disease Neg Hx    Stroke Neg Hx    Esophageal cancer Neg Hx    Rectal cancer Neg Hx     Current Outpatient Medications (Endocrine & Metabolic):    FARXIGA  10 MG TABS tablet, TAKE 1 TABLET BY MOUTH ONCE DAILY BEFORE BREAKFAST   Finerenone  (KERENDIA ) 10 MG TABS, Take 1 tablet (10 mg total) by mouth daily. (Patient not taking: Reported on 03/27/2024)   OZEMPIC , 1 MG/DOSE, 4 MG/3ML SOPN, INJECT 1 MG INTO THE SKIN AS DIRECTED ONCE A WEEK  Current Outpatient Medications (Cardiovascular):    atorvastatin  (LIPITOR) 80 MG tablet, Take 1 tablet (80 mg total) by mouth daily.   carvedilol  (COREG ) 12.5 MG tablet,  Take 1 tablet (12.5 mg total) by mouth 2 (two) times daily. TAKE AN EXTRA  1/2 TABLET 6.25 MG AS NEEDED FOR PALPITATIONS   furosemide  (LASIX ) 40 MG tablet, Take 1 tablet (40 mg total) by mouth as needed. For weight gain of 3lb in 24 hours, 5lb in a week   sacubitril -valsartan  (ENTRESTO ) 49-51 MG, Take 1 tablet by mouth twice daily  Current Outpatient Medications (Respiratory):    levalbuterol  (XOPENEX  HFA) 45 MCG/ACT  inhaler, Inhale 2 puffs into the lungs every 8 (eight) hours as needed for wheezing.   levocetirizine (XYZAL) 5 MG tablet, Take 1 tablet (5 mg total) by mouth every evening for 14 days. (Patient not taking: Reported on 03/27/2024)  Current Outpatient Medications (Analgesics):    allopurinol  (ZYLOPRIM ) 300 MG tablet, Take 1 tablet by mouth once daily   aspirin  EC 81 MG tablet, Take 1 tablet (81 mg total) by mouth daily.   leflunomide  (ARAVA ) 20 MG tablet, Take 1 tablet by mouth once daily   Current Outpatient Medications (Other):    Accu-Chek FastClix Lancets MISC, USE 1  TO CHECK GLUCOSE THREE TIMES DAILY   Blood Glucose Monitoring Suppl (ACCU-CHEK GUIDE) w/Device KIT, 1 Act by Does not apply route 3 (three) times daily.   calcium  carbonate (OS-CAL) 600 MG TABS tablet, Take 600 mg by mouth daily.   citalopram  (CELEXA ) 20 MG tablet, Take 1 tablet by mouth once daily   esomeprazole  (NEXIUM ) 40 MG capsule, Take 1 capsule (40 mg total) by mouth 2 (two) times daily.   gabapentin  (NEURONTIN ) 600 MG tablet, Take 0.5 tablets (300 mg total) by mouth 3 (three) times daily.   glucose blood (ACCU-CHEK GUIDE) test strip, 1 each by Other route 3 (three) times daily.   Omega-3 Fatty Acids (FISH OIL) 500 MG CAPS, Take 500 mg by mouth daily.   Polyethylene Glycol 400 (BLINK TEARS) 0.25 % SOLN, Place 1 drop into both eyes daily as needed (Dry eyes).   RELION PEN NEEDLES 32G X 4 MM MISC, USE 1  SUBCUTANEOUSLY ONCE A WEEK   Reviewed prior external information including notes and imaging from   primary care provider As well as notes that were available from care everywhere and other healthcare systems.  Past medical history, social, surgical and family history all reviewed in electronic medical record.  No pertanent information unless stated regarding to the chief complaint.   Review of Systems:  No headache, visual changes, nausea, vomiting, diarrhea, constipation, dizziness, abdominal pain, skin rash, fevers, chills, night sweats, weight loss, swollen lymph nodes, body aches, joint swelling, chest pain, shortness of breath, mood changes. POSITIVE muscle aches  Objective  Blood pressure 96/60, pulse 89, height 5' 4 (1.626 m), last menstrual period 05/29/1991, SpO2 99%.   General: No apparent distress alert and oriented x3 mood and affect normal, dressed appropriately.  HEENT: Pupils equal, extraocular movements intact  Respiratory: Patient's speak in full sentences and does not appear short of breath  Cardiovascular: No lower extremity edema, non tender, no erythema  Severely antalgic gait noted.  Patient ambulates with the aid of a long cane.  Weakness noted of the gluteal tendons bilaterally.   After informed written and verbal consent, patient was seated on exam table. Right knee was prepped with alcohol swab and utilizing anterolateral approach, patient's right knee space was injected with 4:1  marcaine  0.5%: Kenalog  40mg /dL. Patient tolerated the procedure well without immediate complications.  After informed written and verbal consent, patient was seated on exam table. Left knee was prepped with alcohol swab and utilizing anterolateral approach, patient's left knee space was injected with 4:1  marcaine  0.5%: Kenalog  40mg /dL. Patient tolerated the procedure well without immediate complications.   After verbal consent patient was prepped with alcohol swab and with a 21-gauge 2 inch needle injected into the right greater trochanteric area with 2 cc of 0.5% Marcaine  and 1 cc of  Kenalog  40 mg/mL.  No blood loss.  Band-Aid placed.  Postinjection instructions given  After verbal consent patient was prepped with alcohol swab and with a 21-gauge 2 inch needle injected into the left greater trochanteric area with 2 cc of 0.5% Marcaine  and 1 cc of Kenalog  40 mg/mL.  No blood loss.  Band-Aid placed.  Postinjection instructions given   Impression and Recommendations:     The above documentation has been reviewed and is accurate and complete Mena Simonis M Eshaal Duby, DO

## 2024-04-02 ENCOUNTER — Ambulatory Visit: Admitting: Family Medicine

## 2024-04-02 ENCOUNTER — Encounter: Payer: Self-pay | Admitting: Family Medicine

## 2024-04-02 ENCOUNTER — Telehealth (HOSPITAL_COMMUNITY): Payer: Self-pay

## 2024-04-02 ENCOUNTER — Encounter (HOSPITAL_COMMUNITY): Payer: Self-pay

## 2024-04-02 VITALS — BP 96/60 | HR 89 | Ht 64.0 in

## 2024-04-02 DIAGNOSIS — M7061 Trochanteric bursitis, right hip: Secondary | ICD-10-CM

## 2024-04-02 DIAGNOSIS — N1831 Chronic kidney disease, stage 3a: Secondary | ICD-10-CM | POA: Diagnosis not present

## 2024-04-02 DIAGNOSIS — M170 Bilateral primary osteoarthritis of knee: Secondary | ICD-10-CM

## 2024-04-02 DIAGNOSIS — M48062 Spinal stenosis, lumbar region with neurogenic claudication: Secondary | ICD-10-CM

## 2024-04-02 DIAGNOSIS — E1122 Type 2 diabetes mellitus with diabetic chronic kidney disease: Secondary | ICD-10-CM | POA: Diagnosis not present

## 2024-04-02 DIAGNOSIS — M7062 Trochanteric bursitis, left hip: Secondary | ICD-10-CM

## 2024-04-02 DIAGNOSIS — N1832 Chronic kidney disease, stage 3b: Secondary | ICD-10-CM

## 2024-04-02 NOTE — Telephone Encounter (Signed)
 Called patient to see if she was interested in participating in the Pulmonary Rehab Program. Patient will come in for orientation on 11/21@1030  and will attend the 1:15 exercise class.   Pensions consultant.

## 2024-04-02 NOTE — Assessment & Plan Note (Signed)
 Known severe arthritic changes.  Has been getting injections intermittently.  With the colder weather and I think that this potentially could be potentially contributing to some.  Discussed icing regimen and home exercises, discussed which activities to do and which ones to avoid.  Increase activity slowly.  Discussed icing regimen.  Follow-up with me again in 6 to 12 weeks due to patient's other comorbidities difficult to treat.  Does have failure of back surgery previously.

## 2024-04-02 NOTE — Assessment & Plan Note (Signed)
 Repeat injections given again today.  Tolerated the procedure well, discussed icing regimen and home exercises, discussed which activities to do and which ones to avoid.  Increase activity slowly.  Discussed icing regimen.  Follow-up again in 6 to 12 weeks

## 2024-04-02 NOTE — Patient Instructions (Signed)
 Injection in hips and knees today Good to see you! See you again in 10-12 weeks

## 2024-04-02 NOTE — Assessment & Plan Note (Signed)
 Monitor blood sugar closely.  Chronic kidney disease makes treatment significantly difficult.

## 2024-04-02 NOTE — Telephone Encounter (Signed)
 Pt insurance is active and benefits verified through [facility] Medicare Co-pay 0, DED $257/$257 met, out of pocket $9,350/$1,177.62 met, co-insurance 20%. no pre-authorization required, Reg/[employer]HC 04/02/2024@11 :46, REF# [id number]  36 Lifetime session for Pulmonary Rehab

## 2024-04-09 ENCOUNTER — Other Ambulatory Visit: Payer: Self-pay | Admitting: Internal Medicine

## 2024-04-09 DIAGNOSIS — F419 Anxiety disorder, unspecified: Secondary | ICD-10-CM

## 2024-04-10 NOTE — Progress Notes (Signed)
   ADVANCED HEART FAILURE FOLLOW UP CLINIC NOTE  Referring Physician: Joshua Debby CROME, MD  Primary Care: Joshua Debby CROME, MD Primary Cardiologist:  HPI: Emily Wheeler is a 68 y.o. female who presents for follow up of chronic heart failure with mid range EF.      Patient was diagnosed with HF in 2015 with EF 35-40%. Nuclear stress test was abnormal prompting cardiac cath showing mild-moderate diffuse CAD, recommended for medical therapy. She has had periodic echo surveillance. Last echo 02/2022 EF 40%, global HK. cMRI 11/2022 showed mild LVE with mild LVH with global hypokinesis EF 38%, mild RVE with hypokinesis RVEF 28%, no scar, infiltration or infarct, decreased cardiac output and normal valves. She also had prior monitor in 2023 showing NSR, 3 brief runs SVT (longest lasting 5 beats), 6.3% PVCS, <1% PACs.       SUBJECTIVE:   Overall doing fair since her last visit, she denies any worsening shortness of breath or chest discomfort, she has not had significant changes in her symptoms of lower extremity swelling or orthopnea. Does remain short of breath with more than moderate exertion, discussed pulmonary rehab as an option.   Notes that she has also had worsening heart burn, has been on nexium  for some time. Food also takes a long time in transit.   PMH, current medications, allergies, social history, and family history reviewed in epic.  PHYSICAL EXAM: Vitals:   03/27/24 1121  BP: (!) 90/58  Pulse: 84  SpO2: 93%   GENERAL: Well nourished and in no apparent distress at rest.  PULM:  Normal work of breathing, clear to auscultation bilaterally. Respirations are unlabored.  CARDIAC:  JVP: flat         Normal rate with regular rhythm. No murmurs, rubs or gallops.  No edema. Warm and well perfused extremities. ABDOMEN: Soft, non-tender, non-distended. NEUROLOGIC: Patient is oriented x3 with no focal or lateralizing neurologic deficits.    DATA REVIEW  ECG: 05/2023: NSR, LAFB,  normal qrs duration     ECHO: 2023: LVEF 40%, low normal RV function, normal valves.   CATH: LHC 2016: Mild diffuse disease.  RHC:  Normal filling pressures, CO/CI 4.86/2.37  CMR:  2024: LVEF 38%, reduced cardiac output, RVEF 28%, no LGE or scar, normal valve  ASSESSMENT & PLAN:  Chronic systolic heart failure: Nonischemic, though with mild CAD noted in 2016.  MRI without LGE so unlikely to be significant worsening of CAD.  Suspect symptoms somewhat multifactorial with significant deconditioning. 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 - No angina. Continue aspirin , atorvastatin  80 mg daily   CKD Stage IIIb: Creatinine 1.47, not volume overloaded. -Continue SGLT2   Obesity: Contributing to symptoms - Continue ozempic  - Sleep study at next visit  Follow up in 6 months  I spent 38 minutes caring for this patient today including face to face time, ordering and reviewing labs, reviewing records, seeing the patient, documenting in the record, and arranging follow ups.   Morene Brownie, MD Advanced Heart Failure Mechanical Circulatory Support 04/10/24

## 2024-04-15 ENCOUNTER — Encounter: Payer: Self-pay | Admitting: Internal Medicine

## 2024-04-16 ENCOUNTER — Telehealth (HOSPITAL_COMMUNITY): Payer: Self-pay

## 2024-04-16 NOTE — Telephone Encounter (Signed)
 Called to confirm appt. Pt confirmed appt. Instructed pt on proper footwear. Gave directions along with department number.

## 2024-04-17 ENCOUNTER — Encounter (HOSPITAL_COMMUNITY): Payer: Self-pay

## 2024-04-17 ENCOUNTER — Encounter (HOSPITAL_COMMUNITY)
Admission: RE | Admit: 2024-04-17 | Discharge: 2024-04-17 | Disposition: A | Discharge status: Home / Self Care | Source: Ambulatory Visit | Attending: Cardiology | Admitting: Cardiology

## 2024-04-17 VITALS — BP 122/68 | HR 94 | Wt 219.8 lb

## 2024-04-17 DIAGNOSIS — I5022 Chronic systolic (congestive) heart failure: Secondary | ICD-10-CM | POA: Insufficient documentation

## 2024-04-17 NOTE — Progress Notes (Signed)
 Geisha I Nona 68 y.o. female Pulmonary Rehab Orientation Note This patient who was referred to Pulmonary Rehab by Dr. [name] with the diagnosis of Systolic heart failure arrived today in Cardiac and Pulmonary Rehab. She  arrived ambulatory with assistive device with limp gait. She  does not carry portable oxygen. Per patient, Illianna uses oxygen never. Color good, skin warm and dry. Patient is oriented to time and place. Patient's medical history, psychosocial health, and medications reviewed. Psychosocial assessment reveals patient lives with alone. Kirin is currently retired. Patient hobbies include spending time with others. Patient reports her stress level is low. Patient does not exhibit signs of depression. PHQ2/9 score 0/0. Brylie shows good  coping skills with positive outlook on life. Offered emotional support and reassurance. Will continue to monitor. Physical assessment performed by Nurse pick: Ronal Levin RN. Please see their orientation physical assessment note. Kasidi reports she does take medications as prescribed. Patient states she follows a low sodium  diet. The patient reports she would like to lose weight. Patient's weight will be monitored closely. Demonstration and practice of PLB using pulse oximeter. Cerinity able to return demonstration satisfactorily. Safety and hand hygiene in the exercise area reviewed with patient. Alyssha voices understanding of the information reviewed. Department expectations discussed with patient and achievable goals were set. The patient shows enthusiasm about attending the program and we look forward to working with Cristian. Kaula completed a 6 min walk test today and is scheduled to begin exercise on 05/01/24.   1030-1150 Augustin KATHEE Sharps, BSRT

## 2024-04-17 NOTE — Progress Notes (Signed)
 Pulmonary Individual Treatment Plan  Patient Details  Name: Emily Wheeler MRN: [id number] Date of Birth: 05/02/1956 Referring Provider:   [name] Pulmonary Rehab Walk Test from 04/17/2024 in [facility]  Referring Provider [name]    Initial Encounter Date:  Flowsheet Row Pulmonary Rehab Walk Test from 04/17/2024 in [facility]  Date 04/17/24    Visit Diagnosis: Heart failure, chronic systolic (HCC)  Patient's Home Medications on Admission:   Current Outpatient Medications:    Accu-Chek FastClix Lancets MISC, USE 1  TO CHECK GLUCOSE THREE TIMES DAILY, Disp: 306 each, Rfl: 0   allopurinol  (ZYLOPRIM ) 300 MG tablet, Take 1 tablet by mouth once daily, Disp: 30 tablet, Rfl: 0   aspirin  EC 81 MG tablet, Take 1 tablet (81 mg total) by mouth daily., Disp: 90 tablet, Rfl: 1   atorvastatin  (LIPITOR) 80 MG tablet, Take 1 tablet (80 mg total) by mouth daily., Disp: 90 tablet, Rfl: 3   Blood Glucose Monitoring Suppl (ACCU-CHEK GUIDE) w/Device KIT, 1 Act by Does not apply route 3 (three) times daily., Disp: 2 kit, Rfl: 2   calcium  carbonate (OS-CAL) 600 MG TABS tablet, Take 600 mg by mouth daily., Disp: , Rfl:    carvedilol  (COREG ) 12.5 MG tablet, Take 1 tablet (12.5 mg total) by mouth 2 (two) times daily. TAKE AN EXTRA  1/2 TABLET 6.25 MG AS NEEDED FOR PALPITATIONS, Disp: 225 tablet, Rfl: 1   citalopram  (CELEXA ) 20 MG tablet, Take 1 tablet by mouth once daily, Disp: 90 tablet, Rfl: 0   esomeprazole  (NEXIUM ) 40 MG capsule, Take 1 capsule (40 mg total) by mouth 2 (two) times daily., Disp: 180 capsule, Rfl: 0   FARXIGA  10 MG TABS tablet, TAKE 1 TABLET BY MOUTH ONCE DAILY BEFORE BREAKFAST, Disp: 90 tablet, Rfl: 0   furosemide  (LASIX ) 40 MG tablet, Take 1 tablet (40 mg total) by mouth as needed. For weight gain of 3lb in 24 hours, 5lb in a week, Disp: 90 tablet, Rfl: 3    gabapentin  (NEURONTIN ) 600 MG tablet, Take 0.5 tablets (300 mg total) by mouth 3 (three) times daily., Disp: 270 tablet, Rfl: 0   glucose blood (ACCU-CHEK GUIDE) test strip, 1 each by Other route 3 (three) times daily., Disp: 300 each, Rfl: 1   leflunomide  (ARAVA ) 20 MG tablet, Take 1 tablet by mouth once daily, Disp: 30 tablet, Rfl: 0   levalbuterol  (XOPENEX  HFA) 45 MCG/ACT inhaler, Inhale 2 puffs into the lungs every 8 (eight) hours as needed for wheezing., Disp: 3 each, Rfl: 3   Omega-3 Fatty Acids (FISH OIL) 500 MG CAPS, Take 500 mg by mouth daily., Disp: , Rfl:    OZEMPIC , 1 MG/DOSE, 4 MG/3ML SOPN, INJECT 1 MG INTO THE SKIN AS DIRECTED ONCE A WEEK, Disp: 9 mL, Rfl: 0   Polyethylene Glycol 400 (BLINK TEARS) 0.25 % SOLN, Place 1 drop into both eyes daily as needed (Dry eyes)., Disp: , Rfl:    RELION PEN NEEDLES 32G X 4 MM MISC, USE 1  SUBCUTANEOUSLY ONCE A WEEK, Disp: 50 each, Rfl: 0   sacubitril -valsartan  (ENTRESTO ) 49-51 MG, Take 1 tablet by mouth twice daily, Disp: 180 tablet, Rfl: 0   Finerenone  (KERENDIA ) 10 MG TABS, Take 1 tablet (10 mg total) by mouth daily. (Patient not taking: Reported on 04/17/2024), Disp: 90 tablet, Rfl: 0   levocetirizine (XYZAL ) 5 MG tablet, Take 1 tablet (5 mg total)  by mouth every evening for 14 days. (Patient not taking: Reported on 04/17/2024), Disp: 14 tablet, Rfl: 0  Past Medical History: Past Medical History:  Diagnosis Date   Anemia    mild   Anxiety    Arthritis    Asthma    Cardiomyopathy (HCC)    CHF (congestive heart failure) (HCC)    Diabetes mellitus without complication (HCC)    Frequency of urination    at night   GERD (gastroesophageal reflux disease)    potassium worsens reflux   Hemorrhoids, external    Hyperlipidemia    Hypertension    Neuromuscular disorder (HCC)    neuropathy   Numbness and tingling of both legs    Rheumatoid arthritis (HCC)    Vertigo    hx of    Tobacco Use: Social History   Tobacco Use  Smoking Status  Former   Passive exposure: Current  Smokeless Tobacco Never    Labs: Review Flowsheet  More data exists      Latest Ref Rng & Units 02/26/2023 03/07/2023 07/22/2023 08/21/2023 01/08/2024  Labs for ITP Cardiac and Pulmonary Rehab  Cholestrol 0 - 200 mg/dL 846  - - - -  LDL (calc) 0 - 99 mg/dL 76  - - - -  HDL-C >60.99 mg/dL 55.19  - - - -  Trlycerides 0.0 - 149.0 mg/dL 835.9  - - - -  Hemoglobin A1c 4.6 - 6.5 % - 6.1  6.1  - 7.2   Bicarbonate 20.0 - 28.0 mmol/L 20.0 - 28.0 mmol/L - - - 24.6  24.5  -  TCO2 22 - 32 mmol/L 22 - 32 mmol/L - - - 26  26  -  Acid-base deficit 0.0 - 2.0 mmol/L - - - 1.0  -  O2 Saturation % % - - - 63  62  -    Details       Multiple values from one day are sorted in reverse-chronological order         Capillary Blood Glucose: Lab Results  Component Value Date   GLUCAP 124 (H) 08/21/2023   GLUCAP 116 (H) 01/29/2023   GLUCAP 105 (H) 01/27/2023   GLUCAP 102 (H) 08/19/2018   GLUCAP 126 (H) 10/10/2015     Pulmonary Assessment Scores:  Pulmonary Assessment Scores     Row Name 04/17/24 1057         ADL UCSD   ADL Phase Entry     SOB Score total 69       CAT Score   CAT Score 19       mMRC Score   mMRC Score 4       UCSD: Self-administered rating of dyspnea associated with activities of daily living (ADLs) 6-point scale (0 = not at all to 5 = maximal or unable to do because of breathlessness)  Scoring Scores range from 0 to 120.  Minimally important difference is 5 units  CAT: CAT can identify the health impairment of COPD patients and is better correlated with disease progression.  CAT has a scoring range of zero to 40. The CAT score is classified into four groups of low (less than 10), medium (10 - 20), high (21-30) and very high (31-40) based on the impact level of disease on health status. A CAT score over 10 suggests significant symptoms.  A worsening CAT score could be explained by an exacerbation, poor medication adherence,  poor inhaler technique, or progression of COPD or comorbid conditions.  CAT MCID  is 2 points  mMRC: mMRC (Modified Medical Research Council) Dyspnea Scale is used to assess the degree of baseline functional disability in patients of respiratory disease due to dyspnea. No minimal important difference is established. A decrease in score of 1 point or greater is considered a positive change.   Pulmonary Function Assessment:  Pulmonary Function Assessment - 04/17/24 1111       Breath   Bilateral Breath Sounds Clear    Shortness of Breath Yes;Limiting activity          Exercise Target Goals: Exercise Program Goal: Individual exercise prescription set using results from initial 6 min walk test and THRR while considering  patient's activity barriers and safety.   Exercise Prescription Goal: Initial exercise prescription builds to 30-45 minutes a day of aerobic activity, 2-3 days per week.  Home exercise guidelines will be given to patient during program as part of exercise prescription that the participant will acknowledge.  Activity Barriers & Risk Stratification:  Activity Barriers & Cardiac Risk Stratification - 04/17/24 1108       Activity Barriers & Cardiac Risk Stratification   Activity Barriers Deconditioning;Muscular Weakness;Shortness of Breath;Back Problems;Assistive Device;Arthritis;Balance Concerns;History of Falls    Cardiac Risk Stratification Moderate          6 Minute Walk:  6 Minute Walk     Row Name 04/17/24 1156         6 Minute Walk   Phase Initial     Distance 640 feet     Walk Time 6 minutes     # of Rest Breaks 4  1:30-1:51, 2:37-3:07, 3:43-4:12, 4:42-5:12     MPH 0.87     METS 1.71     RPE 17     Perceived Dyspnea  3     VO2 Peak 5.98     Symptoms No     Resting HR 77 bpm     Resting BP 122/68     Resting Oxygen Saturation  98 %     Exercise Oxygen Saturation  during 6 min walk 98 %     Max Ex. HR 155 bpm     Max Ex. BP 160/80     2  Minute Post BP 130/66       Interval HR   1 Minute HR 144     2 Minute HR 136     3 Minute HR 138     4 Minute HR 147     5 Minute HR 147     6 Minute HR 155     2 Minute Post HR 96     Interval Heart Rate? Yes       Interval Oxygen   Interval Oxygen? Yes     Baseline Oxygen Saturation % 98 %     1 Minute Oxygen Saturation % 94 %     1 Minute Liters of Oxygen 0 L     2 Minute Oxygen Saturation % 100 %     2 Minute Liters of Oxygen 0 L     3 Minute Oxygen Saturation % 100 %     3 Minute Liters of Oxygen 0 L     4 Minute Oxygen Saturation % 98 %     4 Minute Liters of Oxygen 0 L     5 Minute Oxygen Saturation % 99 %     5 Minute Liters of Oxygen 0 L     6 Minute Oxygen Saturation % 98 %  6 Minute Liters of Oxygen 0 L     2 Minute Post Oxygen Saturation % 99 %     2 Minute Post Liters of Oxygen 0 L        Oxygen Initial Assessment:  Oxygen Initial Assessment - 04/17/24 1108       Home Oxygen   Home Oxygen Device None    Sleep Oxygen Prescription None    Home Exercise Oxygen Prescription None    Home Resting Oxygen Prescription None      Initial 6 min Walk   Oxygen Used None      Program Oxygen Prescription   Program Oxygen Prescription None      Intervention   Short Term Goals To learn and understand importance of maintaining oxygen saturations>88%;To learn and demonstrate proper use of respiratory medications;To learn and understand importance of monitoring SPO2 with pulse oximeter and demonstrate accurate use of the pulse oximeter.;To learn and demonstrate proper pursed lip breathing techniques or other breathing techniques.     Long  Term Goals Maintenance of O2 saturations>88%;Compliance with respiratory medication;Verbalizes importance of monitoring SPO2 with pulse oximeter and return demonstration;Exhibits proper breathing techniques, such as pursed lip breathing or other method taught during program session;Demonstrates proper use of MDI's           Oxygen Re-Evaluation:   Oxygen Discharge (Final Oxygen Re-Evaluation):   Initial Exercise Prescription:  Initial Exercise Prescription - 04/17/24 1200       Date of Initial Exercise RX and Referring Provider   Date 04/17/24    Referring Provider [name]    Expected Discharge Date 07/28/24      NuStep   Level 1    SPM 50    Minutes 30    METs 1.4      Prescription Details   Frequency (times per week) 2    Duration Progress to 30 minutes of continuous aerobic without signs/symptoms of physical distress      Intensity   THRR 40-80% of Max Heartrate 60-120    Ratings of Perceived Exertion 11-13    Perceived Dyspnea 0-4      Progression   Progression Continue to progress workloads to maintain intensity without signs/symptoms of physical distress.      Resistance Training   Training Prescription Yes    Weight red bands    Reps 10-15          Perform Capillary Blood Glucose checks as needed.  Exercise Prescription Changes:   Exercise Comments:   Exercise Goals and Review:   Exercise Goals     Row Name 04/17/24 1050             Exercise Goals   Increase Physical Activity Yes       Intervention Provide advice, education, support and counseling about physical activity/exercise needs.;Develop an individualized exercise prescription for aerobic and resistive training based on initial evaluation findings, risk stratification, comorbidities and participant's personal goals.       Expected Outcomes Short Term: Attend rehab on a regular basis to increase amount of physical activity.;Long Term: Exercising regularly at least 3-5 days a week.;Long Term: Add in home exercise to make exercise part of routine and to increase amount of physical activity.       Increase Strength and Stamina Yes       Intervention Provide advice, education, support and counseling about physical activity/exercise needs.;Develop an individualized exercise prescription for aerobic and  resistive training based on initial evaluation findings, risk stratification, comorbidities and  participant's personal goals.       Expected Outcomes Short Term: Increase workloads from initial exercise prescription for resistance, speed, and METs.;Short Term: Perform resistance training exercises routinely during rehab and add in resistance training at home;Long Term: Improve cardiorespiratory fitness, muscular endurance and strength as measured by increased METs and functional capacity ( )       Able to understand and use rate of perceived exertion (RPE) scale Yes       Intervention Provide education and explanation on how to use RPE scale       Expected Outcomes Short Term: Able to use RPE daily in rehab to express subjective intensity level;Long Term:  Able to use RPE to guide intensity level when exercising independently       Able to understand and use Dyspnea scale Yes       Intervention Provide education and explanation on how to use Dyspnea scale       Expected Outcomes Short Term: Able to use Dyspnea scale daily in rehab to express subjective sense of shortness of breath during exertion;Long Term: Able to use Dyspnea scale to guide intensity level when exercising independently       Knowledge and understanding of Target Heart Rate Range (THRR) Yes       Intervention Provide education and explanation of THRR including how the numbers were predicted and where they are located for reference       Expected Outcomes Short Term: Able to state/look up THRR;Long Term: Able to use THRR to govern intensity when exercising independently;Short Term: Able to use daily as guideline for intensity in rehab       Understanding of Exercise Prescription Yes       Intervention Provide education, explanation, and written materials on patient's individual exercise prescription       Expected Outcomes Short Term: Able to explain program exercise prescription;Long Term: Able to explain home exercise prescription  to exercise independently          Exercise Goals Re-Evaluation :   Discharge Exercise Prescription (Final Exercise Prescription Changes):   Nutrition:  Target Goals: Understanding of nutrition guidelines, daily intake of sodium 1500mg , cholesterol 200mg , calories 30% from fat and 7% or less from saturated fats, daily to have 5 or more servings of fruits and vegetables.  Biometrics:  Pre Biometrics - 04/17/24 1212       Pre Biometrics   Grip Strength 12 kg           Nutrition Therapy Plan and Nutrition Goals:   Nutrition Assessments:  MEDIFICTS Score Key: >=70 Need to make dietary changes  40-70 Heart Healthy Diet <= 40 Therapeutic Level Cholesterol Diet   Picture Your Plate Scores: <59 Unhealthy dietary pattern with much room for improvement. 41-50 Dietary pattern unlikely to meet recommendations for good health and room for improvement. 51-60 More healthful dietary pattern, with some room for improvement.  >60 Healthy dietary pattern, although there may be some specific behaviors that could be improved.    Nutrition Goals Re-Evaluation:   Nutrition Goals Discharge (Final Nutrition Goals Re-Evaluation):   Psychosocial: Target Goals: Acknowledge presence or absence of significant depression and/or stress, maximize coping skills, provide positive support system. Participant is able to verbalize types and ability to use techniques and skills needed for reducing stress and depression.  Initial Review & Psychosocial Screening:  Initial Psych Review & Screening - 04/17/24 1049       Initial Review   Current issues with None Identified  Family Dynamics   Good Support System? Yes    Comments Pt has support from her 2 sons      Barriers   Psychosocial barriers to participate in program There are no identifiable barriers or psychosocial needs.      Screening Interventions   Interventions Encouraged to exercise          Quality of Life  Scores:  Scores of 19 and below usually indicate a poorer quality of life in these areas.  A difference of  2-3 points is a clinically meaningful difference.  A difference of 2-3 points in the total score of the Quality of Life Index has been associated with significant improvement in overall quality of life, self-image, physical symptoms, and general health in studies assessing change in quality of life.  PHQ-9: Review Flowsheet  More data exists      04/17/2024 03/11/2024 07/31/2023 07/22/2023 10/16/2022  Depression screen PHQ 2/9  Decreased Interest 0 0 0 0 0  Down, Depressed, Hopeless 0 0 0 0 0  PHQ - 2 Score 0 0 0 0 0  Altered sleeping 0 0 0 - -  Tired, decreased energy 0 0 0 - -  Change in appetite 0 0 0 - -  Feeling bad or failure about yourself  0 0 0 - -  Trouble concentrating 0 0 0 - -  Moving slowly or fidgety/restless 0 0 0 - -  Suicidal thoughts 0 0 0 - -  PHQ-9 Score 0 0  0  - -  Difficult doing work/chores Not difficult at all Not difficult at all Not difficult at all - -    Details       Data saved with a previous flowsheet row definition        Interpretation of Total Score  Total Score Depression Severity:  1-4 = Minimal depression, 5-9 = Mild depression, 10-14 = Moderate depression, 15-19 = Moderately severe depression, 20-27 = Severe depression   Psychosocial Evaluation and Intervention:  Psychosocial Evaluation - 04/17/24 1049       Psychosocial Evaluation & Interventions   Interventions Encouraged to exercise with the program and follow exercise prescription    Comments Hartley denies any psy/soc barriers or concerns at this time.    Expected Outcomes For [place] to participate in PR free of any psy/soc barriers or concerns    Continue Psychosocial Services  No Follow up required          Psychosocial Re-Evaluation:   Psychosocial Discharge (Final Psychosocial Re-Evaluation):   Education: Education Goals: Education classes will be provided on  a weekly basis, covering required topics. Participant will state understanding/return demonstration of topics presented.  Learning Barriers/Preferences:  Learning Barriers/Preferences - 04/17/24 1105       Learning Barriers/Preferences   Learning Barriers Sight    Learning Preferences None          Education Topics: Know Your Numbers Group instruction that is supported by a PowerPoint presentation. Instructor discusses importance of knowing and understanding resting, exercise, and post-exercise oxygen saturation, heart rate, and blood pressure. Oxygen saturation, heart rate, blood pressure, rating of perceived exertion, and dyspnea are reviewed along with a normal range for these values.    Exercise for the Pulmonary Patient Group instruction that is supported by a PowerPoint presentation. Instructor discusses benefits of exercise, core components of exercise, frequency, duration, and intensity of an exercise routine, importance of utilizing pulse oximetry during exercise, safety while exercising, and options of places to exercise outside  of rehab.    MET Level  Group instruction provided by PowerPoint, verbal discussion, and written material to support subject matter. Instructor reviews what METs are and how to increase METs.    Pulmonary Medications Verbally interactive group education provided by instructor with focus on inhaled medications and proper administration.   Anatomy and Physiology of the Respiratory System Group instruction provided by PowerPoint, verbal discussion, and written material to support subject matter. Instructor reviews respiratory cycle and anatomical components of the respiratory system and their functions. Instructor also reviews differences in obstructive and restrictive respiratory diseases with examples of each.    Oxygen Safety Group instruction provided by PowerPoint, verbal discussion, and written material to support subject matter. There is an  overview of "What is Oxygen" and "Why do we need it".  Instructor also reviews how to create a safe environment for oxygen use, the importance of using oxygen as prescribed, and the risks of noncompliance. There is a brief discussion on traveling with oxygen and resources the patient may utilize.   Oxygen Use Group instruction provided by PowerPoint, verbal discussion, and written material to discuss how supplemental oxygen is prescribed and different types of oxygen supply systems. Resources for more information are provided.    Breathing Techniques Group instruction that is supported by demonstration and informational handouts. Instructor discusses the benefits of pursed lip and diaphragmatic breathing and detailed demonstration on how to perform both.     Risk Factor Reduction Group instruction that is supported by a PowerPoint presentation. Instructor discusses the definition of a risk factor, different risk factors for pulmonary disease, and how the heart and lungs work together.   Pulmonary Diseases Group instruction provided by PowerPoint, verbal discussion, and written material to support subject matter. Instructor gives an overview of the different type of pulmonary diseases. There is also a discussion on risk factors and symptoms as well as ways to manage the diseases.   Stress and Energy Conservation Group instruction provided by PowerPoint, verbal discussion, and written material to support subject matter. Instructor gives an overview of stress and the impact it can have on the body. Instructor also reviews ways to reduce stress. There is also a discussion on energy conservation and ways to conserve energy throughout the day.   Warning Signs and Symptoms Group instruction provided by PowerPoint, verbal discussion, and written material to support subject matter. Instructor reviews warning signs and symptoms of stroke, heart attack, cold and flu. Instructor also reviews ways to  prevent the spread of infection.   Other Education Group or individual verbal, written, or video instructions that support the educational goals of the pulmonary rehab program.    Knowledge Questionnaire Score:  Knowledge Questionnaire Score - 04/17/24 1053       Knowledge Questionnaire Score   Pre Score 14/18          Core Components/Risk Factors/Patient Goals at Admission:  Personal Goals and Risk Factors at Admission - 04/17/24 1106       Core Components/Risk Factors/Patient Goals on Admission    Weight Management Yes;Weight Loss    Intervention Weight Management: Develop a combined nutrition and exercise program designed to reach desired caloric intake, while maintaining appropriate intake of nutrient and fiber, sodium and fats, and appropriate energy expenditure required for the weight goal.;Weight Management: Provide education and appropriate resources to help participant work on and attain dietary goals.;Weight Management/Obesity: Establish reasonable short term and long term weight goals.;Obesity: Provide education and appropriate resources to help participant work on and  attain dietary goals.    Expected Outcomes Short Term: Continue to assess and modify interventions until short term weight is achieved;Long Term: Adherence to nutrition and physical activity/exercise program aimed toward attainment of established weight goal;Weight Loss: Understanding of general recommendations for a balanced deficit meal plan, which promotes 1-2 lb weight loss per week and includes a negative energy balance of [phone] kcal/d;Understanding recommendations for meals to include 15-35% energy as protein, 25-35% energy from fat, 35-60% energy from carbohydrates, less than 200mg  of dietary cholesterol, 20-35 gm of total fiber daily;Understanding of distribution of calorie intake throughout the day with the consumption of 4-5 meals/snacks    Improve shortness of breath with ADL's Yes    Intervention  Provide education, individualized exercise plan and daily activity instruction to help decrease symptoms of SOB with activities of daily living.    Expected Outcomes Short Term: Improve cardiorespiratory fitness to achieve a reduction of symptoms when performing ADLs;Long Term: Be able to perform more ADLs without symptoms or delay the onset of symptoms    Heart Failure Yes    Intervention Provide a combined exercise and nutrition program that is supplemented with education, support and counseling about heart failure. Directed toward relieving symptoms such as shortness of breath, decreased exercise tolerance, and extremity edema.    Expected Outcomes Improve functional capacity of life;Short term: Attendance in program 2-3 days a week with increased exercise capacity. Reported lower sodium intake. Reported increased fruit and vegetable intake. Reports medication compliance.;Short term: Daily weights obtained and reported for increase. Utilizing diuretic protocols set by physician.;Long term: Adoption of self-care skills and reduction of barriers for early signs and symptoms recognition and intervention leading to self-care maintenance.          Core Components/Risk Factors/Patient Goals Review:    Core Components/Risk Factors/Patient Goals at Discharge (Final Review):    ITP Comments:   Comments: Dr. Slater Staff is Medical Director for Pulmonary Rehab at [facility].

## 2024-04-21 ENCOUNTER — Telehealth (HOSPITAL_COMMUNITY): Payer: Self-pay

## 2024-04-21 NOTE — Telephone Encounter (Signed)
-----   Message from Emily Wheeler sent at 04/20/2024  1:26 PM EST ----- Regarding: RE: Target Heart Rate increase That sounds great, thank you for reaching out. ----- Message ----- From: Nicholaus Johnnie JINNY Sent: 04/17/2024  11:58 AM EST To: Emily JINNY Brownie, MD Subject: Target Heart Rate increase                     Dr. Brownie,  Bethanny completed her 6 min walk test with us  in Pulmonary Rehab. Her heart rate increased to 160 bpm during her walk test. I am contacting you to ask for a target heart rate increase. Current range is 60-120 bpm. New range is 60-160 bpm.   Thanks, Elianys Conry

## 2024-04-22 NOTE — Progress Notes (Signed)
 Pulmonary Individual Treatment Plan  Patient Details  Name: Emily Wheeler MRN: [id number] Date of Birth: 08-12-1955 Referring Provider:   [name] Pulmonary Rehab Walk Test from 04/17/2024 in [facility]  Referring Provider [name]    Initial Encounter Date:  Flowsheet Row Pulmonary Rehab Walk Test from 04/17/2024 in [facility]  Date 04/17/24    Visit Diagnosis: Heart failure, chronic systolic (HCC)  Patient's Home Medications on Admission:   Current Outpatient Medications:    Accu-Chek FastClix Lancets MISC, USE 1  TO CHECK GLUCOSE THREE TIMES DAILY, Disp: 306 each, Rfl: 0   allopurinol  (ZYLOPRIM ) 300 MG tablet, Take 1 tablet by mouth once daily, Disp: 30 tablet, Rfl: 0   aspirin  EC 81 MG tablet, Take 1 tablet (81 mg total) by mouth daily., Disp: 90 tablet, Rfl: 1   atorvastatin  (LIPITOR) 80 MG tablet, Take 1 tablet (80 mg total) by mouth daily., Disp: 90 tablet, Rfl: 3   Blood Glucose Monitoring Suppl (ACCU-CHEK GUIDE) w/Device KIT, 1 Act by Does not apply route 3 (three) times daily., Disp: 2 kit, Rfl: 2   calcium  carbonate (OS-CAL) 600 MG TABS tablet, Take 600 mg by mouth daily., Disp: , Rfl:    carvedilol  (COREG ) 12.5 MG tablet, Take 1 tablet (12.5 mg total) by mouth 2 (two) times daily. TAKE AN EXTRA  1/2 TABLET 6.25 MG AS NEEDED FOR PALPITATIONS, Disp: 225 tablet, Rfl: 1   citalopram  (CELEXA ) 20 MG tablet, Take 1 tablet by mouth once daily, Disp: 90 tablet, Rfl: 0   esomeprazole  (NEXIUM ) 40 MG capsule, Take 1 capsule (40 mg total) by mouth 2 (two) times daily., Disp: 180 capsule, Rfl: 0   FARXIGA  10 MG TABS tablet, TAKE 1 TABLET BY MOUTH ONCE DAILY BEFORE BREAKFAST, Disp: 90 tablet, Rfl: 0   furosemide  (LASIX ) 40 MG tablet, Take 1 tablet (40 mg total) by mouth as needed. For weight gain of 3lb in 24 hours, 5lb in a week, Disp: 90 tablet, Rfl: 3    gabapentin  (NEURONTIN ) 600 MG tablet, Take 0.5 tablets (300 mg total) by mouth 3 (three) times daily., Disp: 270 tablet, Rfl: 0   glucose blood (ACCU-CHEK GUIDE) test strip, 1 each by Other route 3 (three) times daily., Disp: 300 each, Rfl: 1   leflunomide  (ARAVA ) 20 MG tablet, Take 1 tablet by mouth once daily, Disp: 30 tablet, Rfl: 0   levalbuterol  (XOPENEX  HFA) 45 MCG/ACT inhaler, Inhale 2 puffs into the lungs every 8 (eight) hours as needed for wheezing., Disp: 3 each, Rfl: 3   Omega-3 Fatty Acids (FISH OIL) 500 MG CAPS, Take 500 mg by mouth daily., Disp: , Rfl:    OZEMPIC , 1 MG/DOSE, 4 MG/3ML SOPN, INJECT 1 MG INTO THE SKIN AS DIRECTED ONCE A WEEK, Disp: 9 mL, Rfl: 0   Polyethylene Glycol 400 (BLINK TEARS) 0.25 % SOLN, Place 1 drop into both eyes daily as needed (Dry eyes)., Disp: , Rfl:    RELION PEN NEEDLES 32G X 4 MM MISC, USE 1  SUBCUTANEOUSLY ONCE A WEEK, Disp: 50 each, Rfl: 0   sacubitril -valsartan  (ENTRESTO ) 49-51 MG, Take 1 tablet by mouth twice daily, Disp: 180 tablet, Rfl: 0   Finerenone  (KERENDIA ) 10 MG TABS, Take 1 tablet (10 mg total) by mouth daily. (Patient not taking: Reported on 04/17/2024), Disp: 90 tablet, Rfl: 0   levocetirizine (XYZAL ) 5 MG tablet, Take 1 tablet (5 mg total)  by mouth every evening for 14 days. (Patient not taking: Reported on 04/17/2024), Disp: 14 tablet, Rfl: 0  Past Medical History: Past Medical History:  Diagnosis Date   Anemia    mild   Anxiety    Arthritis    Asthma    Cardiomyopathy (HCC)    CHF (congestive heart failure) (HCC)    Diabetes mellitus without complication (HCC)    Frequency of urination    at night   GERD (gastroesophageal reflux disease)    potassium worsens reflux   Hemorrhoids, external    Hyperlipidemia    Hypertension    Neuromuscular disorder (HCC)    neuropathy   Numbness and tingling of both legs    Rheumatoid arthritis (HCC)    Vertigo    hx of    Tobacco Use: Social History   Tobacco Use  Smoking Status  Former   Passive exposure: Current  Smokeless Tobacco Never    Labs: Review Flowsheet  More data exists      Latest Ref Rng & Units 02/26/2023 03/07/2023 07/22/2023 08/21/2023 01/08/2024  Labs for ITP Cardiac and Pulmonary Rehab  Cholestrol 0 - 200 mg/dL 846  - - - -  LDL (calc) 0 - 99 mg/dL 76  - - - -  HDL-C >60.99 mg/dL 55.19  - - - -  Trlycerides 0.0 - 149.0 mg/dL 835.9  - - - -  Hemoglobin A1c 4.6 - 6.5 % - 6.1  6.1  - 7.2   Bicarbonate 20.0 - 28.0 mmol/L 20.0 - 28.0 mmol/L - - - 24.6  24.5  -  TCO2 22 - 32 mmol/L 22 - 32 mmol/L - - - 26  26  -  Acid-base deficit 0.0 - 2.0 mmol/L - - - 1.0  -  O2 Saturation % % - - - 63  62  -    Details       Multiple values from one day are sorted in reverse-chronological order         Capillary Blood Glucose: Lab Results  Component Value Date   GLUCAP 124 (H) 08/21/2023   GLUCAP 116 (H) 01/29/2023   GLUCAP 105 (H) 01/27/2023   GLUCAP 102 (H) 08/19/2018   GLUCAP 126 (H) 10/10/2015     Pulmonary Assessment Scores:  Pulmonary Assessment Scores     Row Name 04/17/24 1057         ADL UCSD   ADL Phase Entry     SOB Score total 69       CAT Score   CAT Score 19       mMRC Score   mMRC Score 4       UCSD: Self-administered rating of dyspnea associated with activities of daily living (ADLs) 6-point scale (0 = not at all to 5 = maximal or unable to do because of breathlessness)  Scoring Scores range from 0 to 120.  Minimally important difference is 5 units  CAT: CAT can identify the health impairment of COPD patients and is better correlated with disease progression.  CAT has a scoring range of zero to 40. The CAT score is classified into four groups of low (less than 10), medium (10 - 20), high (21-30) and very high (31-40) based on the impact level of disease on health status. A CAT score over 10 suggests significant symptoms.  A worsening CAT score could be explained by an exacerbation, poor medication adherence,  poor inhaler technique, or progression of COPD or comorbid conditions.  CAT MCID  is 2 points  mMRC: mMRC (Modified Medical Research Council) Dyspnea Scale is used to assess the degree of baseline functional disability in patients of respiratory disease due to dyspnea. No minimal important difference is established. A decrease in score of 1 point or greater is considered a positive change.   Pulmonary Function Assessment:  Pulmonary Function Assessment - 04/17/24 1111       Breath   Bilateral Breath Sounds Clear    Shortness of Breath Yes;Limiting activity          Exercise Target Goals: Exercise Program Goal: Individual exercise prescription set using results from initial 6 min walk test and THRR while considering  patient's activity barriers and safety.   Exercise Prescription Goal: Initial exercise prescription builds to 30-45 minutes a day of aerobic activity, 2-3 days per week.  Home exercise guidelines will be given to patient during program as part of exercise prescription that the participant will acknowledge.  Activity Barriers & Risk Stratification:  Activity Barriers & Cardiac Risk Stratification - 04/17/24 1108       Activity Barriers & Cardiac Risk Stratification   Activity Barriers Deconditioning;Muscular Weakness;Shortness of Breath;Back Problems;Assistive Device;Arthritis;Balance Concerns;History of Falls    Cardiac Risk Stratification Moderate          6 Minute Walk:  6 Minute Walk     Row Name 04/17/24 1156         6 Minute Walk   Phase Initial     Distance 640 feet     Walk Time 6 minutes     # of Rest Breaks 4  1:30-1:51, 2:37-3:07, 3:43-4:12, 4:42-5:12     MPH 0.87     METS 1.71     RPE 17     Perceived Dyspnea  3     VO2 Peak 5.98     Symptoms No     Resting HR 77 bpm     Resting BP 122/68     Resting Oxygen Saturation  98 %     Exercise Oxygen Saturation  during 6 min walk 98 %     Max Ex. HR 155 bpm     Max Ex. BP 160/80     2  Minute Post BP 130/66       Interval HR   1 Minute HR 144     2 Minute HR 136     3 Minute HR 138     4 Minute HR 147     5 Minute HR 147     6 Minute HR 155     2 Minute Post HR 96     Interval Heart Rate? Yes       Interval Oxygen   Interval Oxygen? Yes     Baseline Oxygen Saturation % 98 %     1 Minute Oxygen Saturation % 94 %     1 Minute Liters of Oxygen 0 L     2 Minute Oxygen Saturation % 100 %     2 Minute Liters of Oxygen 0 L     3 Minute Oxygen Saturation % 100 %     3 Minute Liters of Oxygen 0 L     4 Minute Oxygen Saturation % 98 %     4 Minute Liters of Oxygen 0 L     5 Minute Oxygen Saturation % 99 %     5 Minute Liters of Oxygen 0 L     6 Minute Oxygen Saturation % 98 %  6 Minute Liters of Oxygen 0 L     2 Minute Post Oxygen Saturation % 99 %     2 Minute Post Liters of Oxygen 0 L        Oxygen Initial Assessment:  Oxygen Initial Assessment - 04/17/24 1108       Home Oxygen   Home Oxygen Device None    Sleep Oxygen Prescription None    Home Exercise Oxygen Prescription None    Home Resting Oxygen Prescription None      Initial 6 min Walk   Oxygen Used None      Program Oxygen Prescription   Program Oxygen Prescription None      Intervention   Short Term Goals To learn and understand importance of maintaining oxygen saturations>88%;To learn and demonstrate proper use of respiratory medications;To learn and understand importance of monitoring SPO2 with pulse oximeter and demonstrate accurate use of the pulse oximeter.;To learn and demonstrate proper pursed lip breathing techniques or other breathing techniques.     Long  Term Goals Maintenance of O2 saturations>88%;Compliance with respiratory medication;Verbalizes importance of monitoring SPO2 with pulse oximeter and return demonstration;Exhibits proper breathing techniques, such as pursed lip breathing or other method taught during program session;Demonstrates proper use of MDI's           Oxygen Re-Evaluation:  Oxygen Re-Evaluation     Row Name 04/21/24 0830             Program Oxygen Prescription   Program Oxygen Prescription None         Home Oxygen   Home Oxygen Device None       Sleep Oxygen Prescription None       Home Exercise Oxygen Prescription None       Home Resting Oxygen Prescription None         Goals/Expected Outcomes   Short Term Goals To learn and understand importance of maintaining oxygen saturations>88%;To learn and demonstrate proper use of respiratory medications;To learn and understand importance of monitoring SPO2 with pulse oximeter and demonstrate accurate use of the pulse oximeter.;To learn and demonstrate proper pursed lip breathing techniques or other breathing techniques.        Long  Term Goals Maintenance of O2 saturations>88%;Compliance with respiratory medication;Verbalizes importance of monitoring SPO2 with pulse oximeter and return demonstration;Exhibits proper breathing techniques, such as pursed lip breathing or other method taught during program session;Demonstrates proper use of MDI's       Goals/Expected Outcomes Compliance and understanding of oxygen saturation monitoring and breathing techniques to decrease shortness of breath.          Oxygen Discharge (Final Oxygen Re-Evaluation):  Oxygen Re-Evaluation - 04/21/24 0830       Program Oxygen Prescription   Program Oxygen Prescription None      Home Oxygen   Home Oxygen Device None    Sleep Oxygen Prescription None    Home Exercise Oxygen Prescription None    Home Resting Oxygen Prescription None      Goals/Expected Outcomes   Short Term Goals To learn and understand importance of maintaining oxygen saturations>88%;To learn and demonstrate proper use of respiratory medications;To learn and understand importance of monitoring SPO2 with pulse oximeter and demonstrate accurate use of the pulse oximeter.;To learn and demonstrate proper pursed lip breathing techniques or  other breathing techniques.     Long  Term Goals Maintenance of O2 saturations>88%;Compliance with respiratory medication;Verbalizes importance of monitoring SPO2 with pulse oximeter and return demonstration;Exhibits proper breathing techniques, such as  pursed lip breathing or other method taught during program session;Demonstrates proper use of MDI's    Goals/Expected Outcomes Compliance and understanding of oxygen saturation monitoring and breathing techniques to decrease shortness of breath.          Initial Exercise Prescription:  Initial Exercise Prescription - 04/17/24 1200       Date of Initial Exercise RX and Referring Provider   Date 04/17/24    Referring Provider [name]    Expected Discharge Date 07/28/24      NuStep   Level 1    SPM 50    Minutes 30    METs 1.4      Prescription Details   Frequency (times per week) 2    Duration Progress to 30 minutes of continuous aerobic without signs/symptoms of physical distress      Intensity   THRR 40-80% of Max Heartrate 60-120    Ratings of Perceived Exertion 11-13    Perceived Dyspnea 0-4      Progression   Progression Continue to progress workloads to maintain intensity without signs/symptoms of physical distress.      Resistance Training   Training Prescription Yes    Weight red bands    Reps 10-15          Perform Capillary Blood Glucose checks as needed.  Exercise Prescription Changes:   Exercise Comments:   Exercise Goals and Review:   Exercise Goals     Row Name 04/17/24 1050             Exercise Goals   Increase Physical Activity Yes       Intervention Provide advice, education, support and counseling about physical activity/exercise needs.;Develop an individualized exercise prescription for aerobic and resistive training based on initial evaluation findings, risk stratification, comorbidities and participant's personal goals.       Expected Outcomes Short Term: Attend rehab on a regular  basis to increase amount of physical activity.;Long Term: Exercising regularly at least 3-5 days a week.;Long Term: Add in home exercise to make exercise part of routine and to increase amount of physical activity.       Increase Strength and Stamina Yes       Intervention Provide advice, education, support and counseling about physical activity/exercise needs.;Develop an individualized exercise prescription for aerobic and resistive training based on initial evaluation findings, risk stratification, comorbidities and participant's personal goals.       Expected Outcomes Short Term: Increase workloads from initial exercise prescription for resistance, speed, and METs.;Short Term: Perform resistance training exercises routinely during rehab and add in resistance training at home;Long Term: Improve cardiorespiratory fitness, muscular endurance and strength as measured by increased METs and functional capacity ( )       Able to understand and use rate of perceived exertion (RPE) scale Yes       Intervention Provide education and explanation on how to use RPE scale       Expected Outcomes Short Term: Able to use RPE daily in rehab to express subjective intensity level;Long Term:  Able to use RPE to guide intensity level when exercising independently       Able to understand and use Dyspnea scale Yes       Intervention Provide education and explanation on how to use Dyspnea scale       Expected Outcomes Short Term: Able to use Dyspnea scale daily in rehab to express subjective sense of shortness of breath during exertion;Long Term: Able to use Dyspnea scale to guide  intensity level when exercising independently       Knowledge and understanding of Target Heart Rate Range (THRR) Yes       Intervention Provide education and explanation of THRR including how the numbers were predicted and where they are located for reference       Expected Outcomes Short Term: Able to state/look up THRR;Long Term: Able to use  THRR to govern intensity when exercising independently;Short Term: Able to use daily as guideline for intensity in rehab       Understanding of Exercise Prescription Yes       Intervention Provide education, explanation, and written materials on patient's individual exercise prescription       Expected Outcomes Short Term: Able to explain program exercise prescription;Long Term: Able to explain home exercise prescription to exercise independently          Exercise Goals Re-Evaluation :  Exercise Goals Re-Evaluation     Row Name 04/21/24 0829             Exercise Goal Re-Evaluation   Exercise Goals Review Increase Physical Activity;Able to understand and use Dyspnea scale;Understanding of Exercise Prescription;Increase Strength and Stamina;Knowledge and understanding of Target Heart Rate Range (THRR);Able to understand and use rate of perceived exertion (RPE) scale       Comments Pt to begin exercise 12/4. Will progress as tolerated.       Expected Outcomes Through exercise at rehab and at home, patient will increase physical capacity and ADL's will be easier to perform. The patient will also feel comfortable establishing a home exercise program.          Discharge Exercise Prescription (Final Exercise Prescription Changes):   Nutrition:  Target Goals: Understanding of nutrition guidelines, daily intake of sodium 1500mg , cholesterol 200mg , calories 30% from fat and 7% or less from saturated fats, daily to have 5 or more servings of fruits and vegetables.  Biometrics:  Pre Biometrics - 04/17/24 1212       Pre Biometrics   Grip Strength 12 kg           Nutrition Therapy Plan and Nutrition Goals:   Nutrition Assessments:  MEDIFICTS Score Key: >=70 Need to make dietary changes  40-70 Heart Healthy Diet <= 40 Therapeutic Level Cholesterol Diet   Picture Your Plate Scores: <59 Unhealthy dietary pattern with much room for improvement. 41-50 Dietary pattern unlikely to  meet recommendations for good health and room for improvement. 51-60 More healthful dietary pattern, with some room for improvement.  >60 Healthy dietary pattern, although there may be some specific behaviors that could be improved.    Nutrition Goals Re-Evaluation:   Nutrition Goals Discharge (Final Nutrition Goals Re-Evaluation):   Psychosocial: Target Goals: Acknowledge presence or absence of significant depression and/or stress, maximize coping skills, provide positive support system. Participant is able to verbalize types and ability to use techniques and skills needed for reducing stress and depression.  Initial Review & Psychosocial Screening:  Initial Psych Review & Screening - 04/17/24 1049       Initial Review   Current issues with None Identified      Family Dynamics   Good Support System? Yes    Comments Pt has support from her 2 sons      Barriers   Psychosocial barriers to participate in program There are no identifiable barriers or psychosocial needs.      Screening Interventions   Interventions Encouraged to exercise          Quality  of Life Scores:  Scores of 19 and below usually indicate a poorer quality of life in these areas.  A difference of  2-3 points is a clinically meaningful difference.  A difference of 2-3 points in the total score of the Quality of Life Index has been associated with significant improvement in overall quality of life, self-image, physical symptoms, and general health in studies assessing change in quality of life.  PHQ-9: Review Flowsheet  More data exists      04/17/2024 03/11/2024 07/31/2023 07/22/2023 10/16/2022  Depression screen PHQ 2/9  Decreased Interest 0 0 0 0 0  Down, Depressed, Hopeless 0 0 0 0 0  PHQ - 2 Score 0 0 0 0 0  Altered sleeping 0 0 0 - -  Tired, decreased energy 0 0 0 - -  Change in appetite 0 0 0 - -  Feeling bad or failure about yourself  0 0 0 - -  Trouble concentrating 0 0 0 - -  Moving slowly or  fidgety/restless 0 0 0 - -  Suicidal thoughts 0 0 0 - -  PHQ-9 Score 0 0  0  - -  Difficult doing work/chores Not difficult at all Not difficult at all Not difficult at all - -    Details       Data saved with a previous flowsheet row definition        Interpretation of Total Score  Total Score Depression Severity:  1-4 = Minimal depression, 5-9 = Mild depression, 10-14 = Moderate depression, 15-19 = Moderately severe depression, 20-27 = Severe depression   Psychosocial Evaluation and Intervention:  Psychosocial Evaluation - 04/17/24 1049       Psychosocial Evaluation & Interventions   Interventions Encouraged to exercise with the program and follow exercise prescription    Comments Emily Wheeler denies any psy/soc barriers or concerns at this time.    Expected Outcomes For Emily Wheeler to participate in PR free of any psy/soc barriers or concerns    Continue Psychosocial Services  No Follow up required          Psychosocial Re-Evaluation:  Psychosocial Re-Evaluation     Row Name 04/20/24 1045             Psychosocial Re-Evaluation   Current issues with None Identified       Comments Monthly psychosocial re-evaluation as follows: No changes since orientation. Emily Wheeler is scheduled to start the program next week.       Expected Outcomes For Emily Wheeler to participate in PR free of any psy/soc barriers or concerns       Interventions Encouraged to attend Pulmonary Rehabilitation for the exercise       Continue Psychosocial Services  No Follow up required          Psychosocial Discharge (Final Psychosocial Re-Evaluation):  Psychosocial Re-Evaluation - 04/20/24 1045       Psychosocial Re-Evaluation   Current issues with None Identified    Comments Monthly psychosocial re-evaluation as follows: No changes since orientation. Emily Wheeler is scheduled to start the program next week.    Expected Outcomes For Emily Wheeler to participate in PR free of any psy/soc barriers or concerns     Interventions Encouraged to attend Pulmonary Rehabilitation for the exercise    Continue Psychosocial Services  No Follow up required          Education: Education Goals: Education classes will be provided on a weekly basis, covering required topics. Participant will state understanding/return demonstration of topics presented.  Learning Barriers/Preferences:  Learning Barriers/Preferences - 04/17/24 1105       Learning Barriers/Preferences   Learning Barriers Sight    Learning Preferences None          Education Topics: Know Your Numbers Group instruction that is supported by a PowerPoint presentation. Instructor discusses importance of knowing and understanding resting, exercise, and post-exercise oxygen saturation, heart rate, and blood pressure. Oxygen saturation, heart rate, blood pressure, rating of perceived exertion, and dyspnea are reviewed along with a normal range for these values.    Exercise for the Pulmonary Patient Group instruction that is supported by a PowerPoint presentation. Instructor discusses benefits of exercise, core components of exercise, frequency, duration, and intensity of an exercise routine, importance of utilizing pulse oximetry during exercise, safety while exercising, and options of places to exercise outside of rehab.    MET Level  Group instruction provided by PowerPoint, verbal discussion, and written material to support subject matter. Instructor reviews what METs are and how to increase METs.    Pulmonary Medications Verbally interactive group education provided by instructor with focus on inhaled medications and proper administration.   Anatomy and Physiology of the Respiratory System Group instruction provided by PowerPoint, verbal discussion, and written material to support subject matter. Instructor reviews respiratory cycle and anatomical components of the respiratory system and their functions. Instructor also reviews differences  in obstructive and restrictive respiratory diseases with examples of each.    Oxygen Safety Group instruction provided by PowerPoint, verbal discussion, and written material to support subject matter. There is an overview of "What is Oxygen" and "Why do we need it".  Instructor also reviews how to create a safe environment for oxygen use, the importance of using oxygen as prescribed, and the risks of noncompliance. There is a brief discussion on traveling with oxygen and resources the patient may utilize.   Oxygen Use Group instruction provided by PowerPoint, verbal discussion, and written material to discuss how supplemental oxygen is prescribed and different types of oxygen supply systems. Resources for more information are provided.    Breathing Techniques Group instruction that is supported by demonstration and informational handouts. Instructor discusses the benefits of pursed lip and diaphragmatic breathing and detailed demonstration on how to perform both.     Risk Factor Reduction Group instruction that is supported by a PowerPoint presentation. Instructor discusses the definition of a risk factor, different risk factors for pulmonary disease, and how the heart and lungs work together.   Pulmonary Diseases Group instruction provided by PowerPoint, verbal discussion, and written material to support subject matter. Instructor gives an overview of the different type of pulmonary diseases. There is also a discussion on risk factors and symptoms as well as ways to manage the diseases.   Stress and Energy Conservation Group instruction provided by PowerPoint, verbal discussion, and written material to support subject matter. Instructor gives an overview of stress and the impact it can have on the body. Instructor also reviews ways to reduce stress. There is also a discussion on energy conservation and ways to conserve energy throughout the day.   Warning Signs and Symptoms Group  instruction provided by PowerPoint, verbal discussion, and written material to support subject matter. Instructor reviews warning signs and symptoms of stroke, heart attack, cold and flu. Instructor also reviews ways to prevent the spread of infection.   Other Education Group or individual verbal, written, or video instructions that support the educational goals of the pulmonary rehab program.    Knowledge Questionnaire Score:  Knowledge Questionnaire Score - 04/17/24 1053       Knowledge Questionnaire Score   Pre Score 14/18          Core Components/Risk Factors/Patient Goals at Admission:  Personal Goals and Risk Factors at Admission - 04/17/24 1106       Core Components/Risk Factors/Patient Goals on Admission    Weight Management Yes;Weight Loss    Intervention Weight Management: Develop a combined nutrition and exercise program designed to reach desired caloric intake, while maintaining appropriate intake of nutrient and fiber, sodium and fats, and appropriate energy expenditure required for the weight goal.;Weight Management: Provide education and appropriate resources to help participant work on and attain dietary goals.;Weight Management/Obesity: Establish reasonable short term and long term weight goals.;Obesity: Provide education and appropriate resources to help participant work on and attain dietary goals.    Expected Outcomes Short Term: Continue to assess and modify interventions until short term weight is achieved;Long Term: Adherence to nutrition and physical activity/exercise program aimed toward attainment of established weight goal;Weight Loss: Understanding of general recommendations for a balanced deficit meal plan, which promotes 1-2 lb weight loss per week and includes a negative energy balance of [phone] kcal/d;Understanding recommendations for meals to include 15-35% energy as protein, 25-35% energy from fat, 35-60% energy from carbohydrates, less than 200mg  of  dietary cholesterol, 20-35 gm of total fiber daily;Understanding of distribution of calorie intake throughout the day with the consumption of 4-5 meals/snacks    Improve shortness of breath with ADL's Yes    Intervention Provide education, individualized exercise plan and daily activity instruction to help decrease symptoms of SOB with activities of daily living.    Expected Outcomes Short Term: Improve cardiorespiratory fitness to achieve a reduction of symptoms when performing ADLs;Long Term: Be able to perform more ADLs without symptoms or delay the onset of symptoms    Heart Failure Yes    Intervention Provide a combined exercise and nutrition program that is supplemented with education, support and counseling about heart failure. Directed toward relieving symptoms such as shortness of breath, decreased exercise tolerance, and extremity edema.    Expected Outcomes Improve functional capacity of life;Short term: Attendance in program 2-3 days a week with increased exercise capacity. Reported lower sodium intake. Reported increased fruit and vegetable intake. Reports medication compliance.;Short term: Daily weights obtained and reported for increase. Utilizing diuretic protocols set by physician.;Long term: Adoption of self-care skills and reduction of barriers for early signs and symptoms recognition and intervention leading to self-care maintenance.          Core Components/Risk Factors/Patient Goals Review:   Goals and Risk Factor Review     Row Name 04/20/24 1046             Core Components/Risk Factors/Patient Goals Review   Personal Goals Review Weight Management/Obesity;Improve shortness of breath with ADL's;Heart Failure       Review Monthly review of patient's Core Components/Risk Factors/Patient Goals are as follows:  Unable to assess goals yet. Emily Wheeler is schedule to start the program next week       Expected Outcomes Pt will show progress toward meeting expected goals and  outcomes.          Core Components/Risk Factors/Patient Goals at Discharge (Final Review):   Goals and Risk Factor Review - 04/20/24 1046       Core Components/Risk Factors/Patient Goals Review   Personal Goals Review Weight Management/Obesity;Improve shortness of breath with ADL's;Heart Failure    Review Monthly review of patient's  Core Components/Risk Factors/Patient Goals are as follows:  Unable to assess goals yet. Emily Wheeler is schedule to start the program next week    Expected Outcomes Pt will show progress toward meeting expected goals and outcomes.          ITP Comments: Pt is making expected progress toward Pulmonary Rehab goals. Recommend continued exercise, life style modification, education, and utilization of breathing techniques to increase stamina and strength, while also decreasing shortness of breath with exertion.  Dr. Slater Staff is Medical Director for Pulmonary Rehab at [facility].

## 2024-04-24 ENCOUNTER — Other Ambulatory Visit: Payer: Self-pay | Admitting: Internal Medicine

## 2024-04-24 DIAGNOSIS — E118 Type 2 diabetes mellitus with unspecified complications: Secondary | ICD-10-CM

## 2024-04-24 DIAGNOSIS — N1831 Chronic kidney disease, stage 3a: Secondary | ICD-10-CM

## 2024-04-28 LAB — LAB REPORT - SCANNED
Albumin, Urine POC: 97.5
Creatinine, POC: 148.3 mg/dL
EGFR: 50
Microalb Creat Ratio: 66

## 2024-04-30 ENCOUNTER — Encounter (HOSPITAL_COMMUNITY)
Admission: RE | Admit: 2024-04-30 | Discharge: 2024-04-30 | Disposition: A | Source: Ambulatory Visit | Attending: Cardiology

## 2024-04-30 DIAGNOSIS — I5022 Chronic systolic (congestive) heart failure: Secondary | ICD-10-CM | POA: Insufficient documentation

## 2024-04-30 LAB — GLUCOSE, CAPILLARY
Glucose-Capillary: 123 mg/dL — ABNORMAL HIGH (ref 70–99)
Glucose-Capillary: 126 mg/dL — ABNORMAL HIGH (ref 70–99)

## 2024-04-30 NOTE — Progress Notes (Signed)
 Daily Session Note  Patient Details  Name: Emily Wheeler MRN: [id number] Date of Birth: July 23, 1955 Referring Provider:   [name] Pulmonary Rehab Walk Test from 04/17/2024 in [facility] for Heart, Vascular, & Lung Health  Referring Provider [name]    Encounter Date: 04/30/2024  Check In:  Session Check In - 04/30/24 1329       Check-In   Supervising physician immediately available to respond to emergencies [facility] MD immediately available    Physician(s) Rosaline Bane, NP    Location [facility]    Staff Present Ronal Levin, RN, BSN;Casey Claudene, Neita Moats, MS, ACSM-CEP, Exercise Physiologist;Sukari Grist Midge HECKLE, ACSM-CEP, Exercise Physiologist    Virtual Visit No    Medication changes reported     Yes    Comments med list reviewed    Fall or balance concerns reported    Yes    Comments pt uses cane to walk    Tobacco Cessation No Change    Warm-up and Cool-down Not performed (comment)    Resistance Training Performed No    VAD Patient? No    PAD/SET Patient? No      Pain Assessment   Currently in Pain? No/denies    Multiple Pain Sites No          Capillary Blood Glucose: Results for orders placed or performed during the hospital encounter of 04/30/24 (from the past 24 hours)  Glucose, capillary     Status: Abnormal   Collection Time: 04/30/24  1:13 PM  Result Value Ref Range   Glucose-Capillary 123 (H) 70 - 99 mg/dL  Glucose, capillary     Status: Abnormal   Collection Time: 04/30/24  2:39 PM  Result Value Ref Range   Glucose-Capillary 126 (H) 70 - 99 mg/dL      Social History   Tobacco Use  Smoking Status Former   Passive exposure: Current  Smokeless Tobacco Never    Goals Met:  Exercise tolerated well No report of concerns or symptoms today Strength training completed today  Goals Unmet:  Not Applicable  Comments: Service time is from 1311 to 1441.    Dr. Slater Staff is Medical Director for  Pulmonary Rehab at [facility]

## 2024-05-05 ENCOUNTER — Encounter (HOSPITAL_COMMUNITY)
Admission: RE | Admit: 2024-05-05 | Discharge: 2024-05-05 | Disposition: A | Source: Ambulatory Visit | Attending: Cardiology

## 2024-05-05 VITALS — Wt 218.9 lb

## 2024-05-05 DIAGNOSIS — I5022 Chronic systolic (congestive) heart failure: Secondary | ICD-10-CM

## 2024-05-05 LAB — GLUCOSE, CAPILLARY
Glucose-Capillary: 143 mg/dL — ABNORMAL HIGH (ref 70–99)
Glucose-Capillary: 124 mg/dL — ABNORMAL HIGH (ref 70–99)

## 2024-05-05 NOTE — Progress Notes (Signed)
 Daily Session Note  Patient Details  Name: Emily Wheeler MRN: [id number] Date of Birth: 01-07-1956 Referring Provider:   [name] Pulmonary Rehab Walk Test from 04/17/2024 in [facility] for Heart, Vascular, & Lung Health  Referring Provider [name]    Encounter Date: 05/05/2024  Check In:  Session Check In - 05/05/24 1503       Check-In   Supervising physician immediately available to respond to emergencies [facility] immediately available    Physician(s) Rosaline Bane, NP    [facility]    Staff Present Ronal Levin, RN, BSN;Casey Claudene, Neita Moats, MS, ACSM-CEP, Exercise Physiologist;Randi Midge HECKLE, ACSM-CEP, Exercise Physiologist    Virtual Visit No    Medication changes reported     Yes    Comments med list reviewed    Fall or balance concerns reported    Yes    Comments pt uses cane to walk    Tobacco Cessation No Change    Warm-up and Cool-down Performed as group-led instruction    Resistance Training Performed Yes    VAD Patient? No    PAD/SET Patient? No      Pain Assessment   Currently in Pain? No/denies    Multiple Pain Sites No          Capillary Blood Glucose: Results for orders placed or performed during the hospital encounter of 05/05/24 (from the past 24 hours)  Glucose, capillary     Status: Abnormal   Collection Time: 05/05/24  1:20 PM  Result Value Ref Range   Glucose-Capillary 143 (H) 70 - 99 mg/dL  Glucose, capillary     Status: Abnormal   Collection Time: 05/05/24  2:43 PM  Result Value Ref Range   Glucose-Capillary 124 (H) 70 - 99 mg/dL     Exercise Prescription Changes - 05/05/24 1500       Response to Exercise   Blood Pressure (Admit) 96/50    Blood Pressure (Exercise) 108/62    Blood Pressure (Exit) 110/60    Heart Rate (Admit) 57 bpm    Heart Rate (Exercise) 85 bpm    Heart Rate (Exit) 59 bpm    Oxygen Saturation (Admit) 98 %    Oxygen Saturation (Exercise) 97 %     Oxygen Saturation (Exit) 97 %    Rating of Perceived Exertion (Exercise) 11    Perceived Dyspnea (Exercise) 0    Duration Continue with 30 min of aerobic exercise without signs/symptoms of physical distress.    Intensity THRR unchanged      Progression   Progression Continue to progress workloads to maintain intensity without signs/symptoms of physical distress.      Resistance Training   Training Prescription Yes    Weight red bands    Reps 10-15    Time 10 Minutes      NuStep   Level 1    SPM 79    Minutes 30    METs 1.9          Social History   Tobacco Use  Smoking Status Former   Passive exposure: Current  Smokeless Tobacco Never    Goals Met:  Proper associated with RPD/PD & O2 Sat Exercise tolerated well No report of concerns or symptoms today Strength training completed today  Goals Unmet:  Not Applicable  Comments: Service time is from 1314 to 1440.    Dr. Slater Staff is Medical Director for Pulmonary Rehab at [facility].

## 2024-05-07 ENCOUNTER — Encounter (HOSPITAL_COMMUNITY)
Admission: RE | Admit: 2024-05-07 | Discharge: 2024-05-07 | Disposition: A | Discharge status: Home / Self Care | Source: Ambulatory Visit | Attending: Cardiology | Admitting: Cardiology

## 2024-05-07 DIAGNOSIS — I5022 Chronic systolic (congestive) heart failure: Secondary | ICD-10-CM

## 2024-05-07 NOTE — Progress Notes (Signed)
 Daily Session Note  Patient Details  Name: Emily Wheeler MRN: [id number] Date of Birth: Sep 20, 1955 Referring Provider:   [name] Pulmonary Rehab Walk Test from 04/17/2024 in [facility] for Heart, Vascular, & Lung Health  Referring Provider [name]    Encounter Date: 05/07/2024  Check In:  Session Check In - 05/07/24 1333       Check-In   Supervising physician immediately available to respond to emergencies [facility] immediately available    Physician(s) Lum Louis, NP    [facility]    Staff Present Ronal Levin, RN, BSN;Casey Claudene, Neita Moats, MS, ACSM-CEP, Exercise Physiologist;Randi Midge BS, ACSM-CEP, Exercise Physiologist    Virtual Visit No    Medication changes reported     Yes    Comments med list reviewed    Fall or balance concerns reported    Yes    Comments pt uses cane to walk    Tobacco Cessation No Change    Warm-up and Cool-down Performed as group-led instruction    Resistance Training Performed Yes    VAD Patient? No    PAD/SET Patient? No      Pain Assessment   Currently in Pain? No/denies    Multiple Pain Sites No          Capillary Blood Glucose: No results found for this or any previous visit (from the past 24 hours).    Tobacco Use History[1]  Goals Met:  Independence with exercise equipment Exercise tolerated well Queuing for purse lip breathing No report of concerns or symptoms today Strength training completed today  Goals Unmet:  Not Applicable  Comments: Service time is from 1307 to 1441    Dr. Slater Staff is Medical Director for Pulmonary Rehab at [facility].     [1]  Social History Tobacco Use  Smoking Status Former   Passive exposure: Current  Smokeless Tobacco Never

## 2024-05-09 ENCOUNTER — Other Ambulatory Visit: Payer: Self-pay | Admitting: Internal Medicine

## 2024-05-09 DIAGNOSIS — M069 Rheumatoid arthritis, unspecified: Secondary | ICD-10-CM

## 2024-05-12 ENCOUNTER — Encounter (HOSPITAL_COMMUNITY)
Admission: RE | Admit: 2024-05-12 | Discharge: 2024-05-12 | Disposition: A | Source: Ambulatory Visit | Attending: Cardiology

## 2024-05-12 DIAGNOSIS — I5022 Chronic systolic (congestive) heart failure: Secondary | ICD-10-CM | POA: Diagnosis not present

## 2024-05-12 NOTE — Progress Notes (Signed)
 Daily Session Note  Patient Details  Name: Emily Wheeler MRN: [id number] Date of Birth: February 01, 1956 Referring Provider:   Conrad Wheeler Pulmonary Rehab Walk Test from 04/17/2024 in [facility] for Heart, Vascular, & Lung Health  Referring Provider [name]    Encounter Date: 05/12/2024  Check In:  Session Check In - 05/12/24 1349       Check-In   Supervising physician immediately available to respond to emergencies [facility] immediately available    Physician(s) Orren Fabry, PA    [facility]    Staff Present Ronal Levin, RN, BSN;Casey Claudene, Neita Moats, MS, ACSM-CEP, Exercise Physiologist;Carlette Bernett, RN, BSN    Virtual Visit No    Medication changes reported     Yes    Comments med list reviewed    Fall or balance concerns reported    Yes    Comments pt uses cane to walk    Tobacco Cessation No Change    Warm-up and Cool-down Performed as group-led instruction    Resistance Training Performed Yes    VAD Patient? No    PAD/SET Patient? No      Pain Assessment   Currently in Pain? No/denies    Multiple Pain Sites No          Capillary Blood Glucose: No results found for this or any previous visit (from the past 24 hours).    Tobacco Use History[1]  Goals Met:  Exercise tolerated well Queuing for purse lip breathing No report of concerns or symptoms today Strength training completed today  Goals Unmet:  Not Applicable  Comments: Service time is from 1314 to 1441    Dr. Slater Staff is Medical Director for Pulmonary Rehab at [facility].     [1]  Social History Tobacco Use  Smoking Status Former   Passive exposure: Current  Smokeless Tobacco Never

## 2024-05-14 ENCOUNTER — Encounter (HOSPITAL_COMMUNITY)

## 2024-05-14 DIAGNOSIS — I5022 Chronic systolic (congestive) heart failure: Secondary | ICD-10-CM | POA: Diagnosis not present

## 2024-05-14 NOTE — Progress Notes (Signed)
 Daily Session Note  Patient Details  Name: Emily Wheeler MRN: [id number] Date of Birth: [birth year] Referring Provider:   [name] Pulmonary Rehab Walk Test from 04/17/2024 in [facility] for Heart, Vascular, & Lung Health  Referring Provider [name]    Encounter Date: 05/14/2024  Check In:  Session Check In - 05/14/24 1328       Check-In   Supervising physician immediately available to respond to emergencies [facility] immediately available    Physician(s) Barnie Hila, NP    [facility]    Staff Present Ronal Levin, RN, BSN;Casey Claudene, Neita Moats, MS, ACSM-CEP, Exercise Physiologist;Randi Midge HECKLE, ACSM-CEP, Exercise Physiologist    Virtual Visit No    Medication changes reported     No    Fall or balance concerns reported    Yes    Comments pt uses cane to walk    Tobacco Cessation No Change    Warm-up and Cool-down Performed as group-led instruction    Resistance Training Performed Yes    VAD Patient? No    PAD/SET Patient? No      Pain Assessment   Currently in Pain? No/denies    Multiple Pain Sites No          Capillary Blood Glucose: No results found for this or any previous visit (from the past 24 hours).    Tobacco Use History[1]  Goals Met:  Exercise tolerated well Queuing for purse lip breathing No report of concerns or symptoms today Strength training completed today  Goals Unmet:  Not Applicable  Comments: Service time is from 1306 to 1446    Dr. Slater Staff is Medical Director for Pulmonary Rehab at [facility].     [1]  Social History Tobacco Use  Smoking Status Former   Passive exposure: Current  Smokeless Tobacco Never

## 2024-05-17 ENCOUNTER — Other Ambulatory Visit: Payer: Self-pay | Admitting: Physician Assistant

## 2024-05-19 ENCOUNTER — Encounter (HOSPITAL_COMMUNITY)
Admission: RE | Admit: 2024-05-19 | Discharge: 2024-05-19 | Disposition: A | Discharge status: Home / Self Care | Source: Ambulatory Visit | Attending: Cardiology | Admitting: Cardiology

## 2024-05-19 VITALS — Wt 218.9 lb

## 2024-05-19 DIAGNOSIS — I5022 Chronic systolic (congestive) heart failure: Secondary | ICD-10-CM

## 2024-05-19 NOTE — Progress Notes (Signed)
 Daily Session Note  Patient Details  Name: Emily Wheeler MRN: [id number] Date of Birth: [birth year] Referring Provider:   [name] Pulmonary Rehab Walk Test from 04/17/2024 in [facility] for Heart, Vascular, & Lung Health  Referring Provider [name]    Encounter Date: 05/19/2024  Check In:  Session Check In - 05/19/24 1526       Check-In   Supervising physician immediately available to respond to emergencies [facility] MD immediately available    Physician(s) Barnie Hila, NP    [facility]    Staff Present Ronal Levin, RN, BSN;Tranisha Tissue, RT;Randi Reeve BS, ACSM-CEP, Exercise Physiologist    Virtual Visit No    Medication changes reported     No    Fall or balance concerns reported    Yes    Comments pt uses cane to walk    Tobacco Cessation No Change    Warm-up and Cool-down Performed as group-led instruction    Resistance Training Performed Yes    VAD Patient? No    PAD/SET Patient? No      Pain Assessment   Currently in Pain? No/denies    Multiple Pain Sites No          Capillary Blood Glucose: No results found for this or any previous visit (from the past 24 hours).   Exercise Prescription Changes - 05/19/24 1500       Response to Exercise   Blood Pressure (Admit) 132/70    Blood Pressure (Exercise) 112/66    Blood Pressure (Exit) 90/60    Heart Rate (Admit) 100 bpm    Heart Rate (Exercise) 90 bpm    Heart Rate (Exit) 81 bpm    Oxygen Saturation (Admit) 97 %    Oxygen Saturation (Exercise) 97 %    Oxygen Saturation (Exit) 97 %    Rating of Perceived Exertion (Exercise) 12    Perceived Dyspnea (Exercise) 1    Duration Continue with 30 min of aerobic exercise without signs/symptoms of physical distress.    Intensity THRR unchanged      Progression   Progression Continue to progress workloads to maintain intensity without signs/symptoms of physical distress.      Resistance Training   Weight red  bands    Reps 10-15    Time 10 Minutes      NuStep   Level 2    SPM 106    Minutes 30    METs 2.4          Tobacco Use History[1]  Goals Met:  Proper associated with RPD/PD & O2 Sat Independence with exercise equipment Exercise tolerated well No report of concerns or symptoms today Strength training completed today  Goals Unmet:  Not Applicable  Comments: Service time is from 1302 to 1439.    Dr. Slater Staff is Medical Director for Pulmonary Rehab at [facility].     [1]  Social History Tobacco Use  Smoking Status Former   Passive exposure: Current  Smokeless Tobacco Never

## 2024-05-20 NOTE — Progress Notes (Signed)
 Pulmonary Individual Treatment Plan  Patient Details  Name: Emily Wheeler MRN: [id number] Date of Birth: 03-16-1956 Referring Provider:   [name] Pulmonary Rehab Walk Test from 04/17/2024 in [facility]  Referring Provider [name]    Initial Encounter Date:  Flowsheet Row Pulmonary Rehab Walk Test from 04/17/2024 in [facility], [facility]  Date 04/17/24    Visit Diagnosis: Heart failure, chronic systolic (HCC)  Patient's Home Medications on Admission:  Current Medications[1]  Past Medical History: Past Medical History:  Diagnosis Date   Anemia    mild   Anxiety    Arthritis    Asthma    Cardiomyopathy (HCC)    CHF (congestive heart failure) (HCC)    Diabetes mellitus without complication (HCC)    Frequency of urination    at night   GERD (gastroesophageal reflux disease)    potassium worsens reflux   Hemorrhoids, external    Hyperlipidemia    Hypertension    Neuromuscular disorder (HCC)    neuropathy   Numbness and tingling of both legs    Rheumatoid arthritis (HCC)    Vertigo    hx of    Tobacco Use: Tobacco Use History[2]  Labs: Review Flowsheet  More data exists      Latest Ref Rng & Units 02/26/2023 03/07/2023 07/22/2023 08/21/2023 01/08/2024  Labs for ITP Cardiac and Pulmonary Rehab  Cholestrol 0 - 200 mg/dL 846  - - - -  LDL (calc) 0 - 99 mg/dL 76  - - - -  HDL-C >60.99 mg/dL 55.19  - - - -  Trlycerides 0.0 - 149.0 mg/dL 835.9  - - - -  Hemoglobin A1c 4.6 - 6.5 % - 6.1  6.1  - 7.2   Bicarbonate 20.0 - 28.0 mmol/L 20.0 - 28.0 mmol/L - - - 24.6  24.5  -  TCO2 22 - 32 mmol/L 22 - 32 mmol/L - - - 26  26  -  Acid-base deficit 0.0 - 2.0 mmol/L - - - 1.0  -  O2 Saturation % % - - - 63  62  -    Details       Multiple values from one day are sorted in reverse-chronological order         Capillary Blood Glucose: Lab Results   Component Value Date   GLUCAP 124 (H) 05/05/2024   GLUCAP 143 (H) 05/05/2024   GLUCAP 126 (H) 04/30/2024   GLUCAP 123 (H) 04/30/2024   GLUCAP 124 (H) 08/21/2023     Pulmonary Assessment Scores:  Pulmonary Assessment Scores     Row Name 04/17/24 1057         ADL UCSD   ADL Phase Entry     SOB Score total 69       CAT Score   CAT Score 19       mMRC Score   mMRC Score 4       UCSD: Self-administered rating of dyspnea associated with activities of daily living (ADLs) 6-point scale (0 = not at all to 5 = maximal or unable to do because of breathlessness)  Scoring Scores range from 0 to 120.  Minimally important difference is 5 units  CAT: CAT can identify the health impairment of COPD patients and is better correlated with disease progression.  CAT has a scoring range of zero to 40. The CAT score is classified into four groups of low (  less than 10), medium (10 - 20), high (21-30) and very high (31-40) based on the impact level of disease on health status. A CAT score over 10 suggests significant symptoms.  A worsening CAT score could be explained by an exacerbation, poor medication adherence, poor inhaler technique, or progression of COPD or comorbid conditions.  CAT MCID is 2 points  mMRC: mMRC (Modified Medical Research Council) Dyspnea Scale is used to assess the degree of baseline functional disability in patients of respiratory disease due to dyspnea. No minimal important difference is established. A decrease in score of 1 point or greater is considered a positive change.   Pulmonary Function Assessment:  Pulmonary Function Assessment - 04/17/24 1111       Breath   Bilateral Breath Sounds Clear    Shortness of Breath Yes;Limiting activity          Exercise Target Goals: Exercise Program Goal: Individual exercise prescription set using results from initial 6 min walk test and THRR while considering  patients activity barriers and safety.   Exercise  Prescription Goal: Initial exercise prescription builds to 30-45 minutes a day of aerobic activity, 2-3 days per week.  Home exercise guidelines will be given to patient during program as part of exercise prescription that the participant will acknowledge.  Activity Barriers & Risk Stratification:  Activity Barriers & Cardiac Risk Stratification - 04/17/24 1108       Activity Barriers & Cardiac Risk Stratification   Activity Barriers Deconditioning;Muscular Weakness;Shortness of Breath;Back Problems;Assistive Device;Arthritis;Balance Concerns;History of Falls    Cardiac Risk Stratification Moderate          6 Minute Walk:  6 Minute Walk     Row Name 04/17/24 1156         6 Minute Walk   Phase Initial     Distance 640 feet     Walk Time 6 minutes     # of Rest Breaks 4  1:30-1:51, 2:37-3:07, 3:43-4:12, 4:42-5:12     MPH 0.87     METS 1.71     RPE 17     Perceived Dyspnea  3     VO2 Peak 5.98     Symptoms No     Resting HR 77 bpm     Resting BP 122/68     Resting Oxygen Saturation  98 %     Exercise Oxygen Saturation  during 6 min walk 98 %     Max Ex. HR 155 bpm     Max Ex. BP 160/80     2 Minute Post BP 130/66       Interval HR   1 Minute HR 144     2 Minute HR 136     3 Minute HR 138     4 Minute HR 147     5 Minute HR 147     6 Minute HR 155     2 Minute Post HR 96     Interval Heart Rate? Yes       Interval Oxygen   Interval Oxygen? Yes     Baseline Oxygen Saturation % 98 %     1 Minute Oxygen Saturation % 94 %     1 Minute Liters of Oxygen 0 L     2 Minute Oxygen Saturation % 100 %     2 Minute Liters of Oxygen 0 L     3 Minute Oxygen Saturation % 100 %     3 Minute Liters of Oxygen 0  L     4 Minute Oxygen Saturation % 98 %     4 Minute Liters of Oxygen 0 L     5 Minute Oxygen Saturation % 99 %     5 Minute Liters of Oxygen 0 L     6 Minute Oxygen Saturation % 98 %     6 Minute Liters of Oxygen 0 L     2 Minute Post Oxygen Saturation % 99 %     2  Minute Post Liters of Oxygen 0 L        Oxygen Initial Assessment:  Oxygen Initial Assessment - 04/17/24 1108       Home Oxygen   Home Oxygen Device None    Sleep Oxygen Prescription None    Home Exercise Oxygen Prescription None    Home Resting Oxygen Prescription None      Initial 6 min Walk   Oxygen Used None      Program Oxygen Prescription   Program Oxygen Prescription None      Intervention   Short Term Goals To learn and understand importance of maintaining oxygen saturations>88%;To learn and demonstrate proper use of respiratory medications;To learn and understand importance of monitoring SPO2 with pulse oximeter and demonstrate accurate use of the pulse oximeter.;To learn and demonstrate proper pursed lip breathing techniques or other breathing techniques.     Long  Term Goals Maintenance of O2 saturations>88%;Compliance with respiratory medication;Verbalizes importance of monitoring SPO2 with pulse oximeter and return demonstration;Exhibits proper breathing techniques, such as pursed lip breathing or other method taught during program session;Demonstrates proper use of MDIs          Oxygen Re-Evaluation:  Oxygen Re-Evaluation     Row Name 04/21/24 0830 05/15/24 1501           Program Oxygen Prescription   Program Oxygen Prescription None None        Home Oxygen   Home Oxygen Device None None      Sleep Oxygen Prescription None None      Home Exercise Oxygen Prescription None None      Home Resting Oxygen Prescription None None        Goals/Expected Outcomes   Short Term Goals To learn and understand importance of maintaining oxygen saturations>88%;To learn and demonstrate proper use of respiratory medications;To learn and understand importance of monitoring SPO2 with pulse oximeter and demonstrate accurate use of the pulse oximeter.;To learn and demonstrate proper pursed lip breathing techniques or other breathing techniques.  To learn and understand  importance of maintaining oxygen saturations>88%;To learn and demonstrate proper use of respiratory medications;To learn and understand importance of monitoring SPO2 with pulse oximeter and demonstrate accurate use of the pulse oximeter.;To learn and demonstrate proper pursed lip breathing techniques or other breathing techniques.       Long  Term Goals Maintenance of O2 saturations>88%;Compliance with respiratory medication;Verbalizes importance of monitoring SPO2 with pulse oximeter and return demonstration;Exhibits proper breathing techniques, such as pursed lip breathing or other method taught during program session;Demonstrates proper use of MDIs Maintenance of O2 saturations>88%;Compliance with respiratory medication;Verbalizes importance of monitoring SPO2 with pulse oximeter and return demonstration;Exhibits proper breathing techniques, such as pursed lip breathing or other method taught during program session;Demonstrates proper use of MDIs      Goals/Expected Outcomes Compliance and understanding of oxygen saturation monitoring and breathing techniques to decrease shortness of breath. Compliance and understanding of oxygen saturation monitoring and breathing techniques to decrease shortness of breath.  Oxygen Discharge (Final Oxygen Re-Evaluation):  Oxygen Re-Evaluation - 05/15/24 1501       Program Oxygen Prescription   Program Oxygen Prescription None      Home Oxygen   Home Oxygen Device None    Sleep Oxygen Prescription None    Home Exercise Oxygen Prescription None    Home Resting Oxygen Prescription None      Goals/Expected Outcomes   Short Term Goals To learn and understand importance of maintaining oxygen saturations>88%;To learn and demonstrate proper use of respiratory medications;To learn and understand importance of monitoring SPO2 with pulse oximeter and demonstrate accurate use of the pulse oximeter.;To learn and demonstrate proper pursed lip breathing techniques  or other breathing techniques.     Long  Term Goals Maintenance of O2 saturations>88%;Compliance with respiratory medication;Verbalizes importance of monitoring SPO2 with pulse oximeter and return demonstration;Exhibits proper breathing techniques, such as pursed lip breathing or other method taught during program session;Demonstrates proper use of MDIs    Goals/Expected Outcomes Compliance and understanding of oxygen saturation monitoring and breathing techniques to decrease shortness of breath.          Initial Exercise Prescription:  Initial Exercise Prescription - 04/17/24 1200       Date of Initial Exercise RX and Referring Provider   Date 04/17/24    Referring Provider [name]    Expected Discharge Date 07/28/24      NuStep   Level 1    SPM 50    Minutes 30    METs 1.4      Prescription Details   Frequency (times per week) 2    Duration Progress to 30 minutes of continuous aerobic without signs/symptoms of physical distress      Intensity   THRR 40-80% of Max Heartrate 60-120    Ratings of Perceived Exertion 11-13    Perceived Dyspnea 0-4      Progression   Progression Continue to progress workloads to maintain intensity without signs/symptoms of physical distress.      Resistance Training   Training Prescription Yes    Weight red bands    Reps 10-15          Perform Capillary Blood Glucose checks as needed.  Exercise Prescription Changes:   Exercise Prescription Changes     Row Name 05/05/24 1500 05/19/24 1500           Response to Exercise   Blood Pressure (Admit) 96/50 132/70      Blood Pressure (Exercise) 108/62 112/66      Blood Pressure (Exit) 110/60 90/60      Heart Rate (Admit) 57 bpm 100 bpm      Heart Rate (Exercise) 85 bpm 90 bpm      Heart Rate (Exit) 59 bpm 81 bpm      Oxygen Saturation (Admit) 98 % 97 %      Oxygen Saturation (Exercise) 97 % 97 %      Oxygen Saturation (Exit) 97 % 97 %      Rating of Perceived Exertion (Exercise) 11  12      Perceived Dyspnea (Exercise) 0 1      Duration Continue with 30 min of aerobic exercise without signs/symptoms of physical distress. Continue with 30 min of aerobic exercise without signs/symptoms of physical distress.      Intensity THRR unchanged THRR unchanged        Progression   Progression Continue to progress workloads to maintain intensity without signs/symptoms of physical distress. Continue to progress workloads  to maintain intensity without signs/symptoms of physical distress.        Resistance Training   Training Prescription Yes --      Weight red bands red bands      Reps 10-15 10-15      Time 10 Minutes 10 Minutes        NuStep   Level 1 2      SPM 79 106      Minutes 30 30      METs 1.9 2.4         Exercise Comments:   Exercise Comments     Row Name 04/30/24 1508           Exercise Comments Pt completed first day of group exercise. She exercised on the recumbent stepper for 30 min, level 1, METs 1.5. Tolerated well and she was happy. She performed warm up and cool down intermittently sitting due to severe back issues. Discussed METs.          Exercise Goals and Review:   Exercise Goals     Row Name 04/17/24 1050             Exercise Goals   Increase Physical Activity Yes       Intervention Provide advice, education, support and counseling about physical activity/exercise needs.;Develop an individualized exercise prescription for aerobic and resistive training based on initial evaluation findings, risk stratification, comorbidities and participant's personal goals.       Expected Outcomes Short Term: Attend rehab on a regular basis to increase amount of physical activity.;Long Term: Exercising regularly at least 3-5 days a week.;Long Term: Add in home exercise to make exercise part of routine and to increase amount of physical activity.       Increase Strength and Stamina Yes       Intervention Provide advice, education, support and counseling  about physical activity/exercise needs.;Develop an individualized exercise prescription for aerobic and resistive training based on initial evaluation findings, risk stratification, comorbidities and participant's personal goals.       Expected Outcomes Short Term: Increase workloads from initial exercise prescription for resistance, speed, and METs.;Short Term: Perform resistance training exercises routinely during rehab and add in resistance training at home;Long Term: Improve cardiorespiratory fitness, muscular endurance and strength as measured by increased METs and functional capacity ( )       Able to understand and use rate of perceived exertion (RPE) scale Yes       Intervention Provide education and explanation on how to use RPE scale       Expected Outcomes Short Term: Able to use RPE daily in rehab to express subjective intensity level;Long Term:  Able to use RPE to guide intensity level when exercising independently       Able to understand and use Dyspnea scale Yes       Intervention Provide education and explanation on how to use Dyspnea scale       Expected Outcomes Short Term: Able to use Dyspnea scale daily in rehab to express subjective sense of shortness of breath during exertion;Long Term: Able to use Dyspnea scale to guide intensity level when exercising independently       Knowledge and understanding of Target Heart Rate Range (THRR) Yes       Intervention Provide education and explanation of THRR including how the numbers were predicted and where they are located for reference       Expected Outcomes Short Term: Able to state/look up THRR;Long Term: Able  to use THRR to govern intensity when exercising independently;Short Term: Able to use daily as guideline for intensity in rehab       Understanding of Exercise Prescription Yes       Intervention Provide education, explanation, and written materials on patient's individual exercise prescription       Expected Outcomes Short  Term: Able to explain program exercise prescription;Long Term: Able to explain home exercise prescription to exercise independently          Exercise Goals Re-Evaluation :  Exercise Goals Re-Evaluation     Row Name 04/21/24 0829 05/15/24 1459           Exercise Goal Re-Evaluation   Exercise Goals Review Increase Physical Activity;Able to understand and use Dyspnea scale;Understanding of Exercise Prescription;Increase Strength and Stamina;Knowledge and understanding of Target Heart Rate Range (THRR);Able to understand and use rate of perceived exertion (RPE) scale Increase Physical Activity;Able to understand and use Dyspnea scale;Understanding of Exercise Prescription;Increase Strength and Stamina;Knowledge and understanding of Target Heart Rate Range (THRR);Able to understand and use rate of perceived exertion (RPE) scale      Comments Pt to begin exercise 12/4. Will progress as tolerated. Pt has completed 5 days of group exercise. She has been exercising on the recumbent stepper for 30 min, level 2, METs 2.5. Will progress to walking the track for 15 min next session. She is motivated. Performs warm up and cool down, sitting for resistance training, using red bands, 3.7 lbs. Will progress as tolerated.      Expected Outcomes Through exercise at rehab and at home, patient will increase physical capacity and ADL's will be easier to perform. The patient will also feel comfortable establishing a home exercise program. Through exercise at rehab and at home, patient will increase physical capacity and ADL's will be easier to perform. The patient will also feel comfortable establishing a home exercise program.         Discharge Exercise Prescription (Final Exercise Prescription Changes):  Exercise Prescription Changes - 05/19/24 1500       Response to Exercise   Blood Pressure (Admit) 132/70    Blood Pressure (Exercise) 112/66    Blood Pressure (Exit) 90/60    Heart Rate (Admit) 100 bpm     Heart Rate (Exercise) 90 bpm    Heart Rate (Exit) 81 bpm    Oxygen Saturation (Admit) 97 %    Oxygen Saturation (Exercise) 97 %    Oxygen Saturation (Exit) 97 %    Rating of Perceived Exertion (Exercise) 12    Perceived Dyspnea (Exercise) 1    Duration Continue with 30 min of aerobic exercise without signs/symptoms of physical distress.    Intensity THRR unchanged      Progression   Progression Continue to progress workloads to maintain intensity without signs/symptoms of physical distress.      Resistance Training   Weight red bands    Reps 10-15    Time 10 Minutes      NuStep   Level 2    SPM 106    Minutes 30    METs 2.4          Nutrition:  Target Goals: Understanding of nutrition guidelines, daily intake of sodium 1500mg , cholesterol 200mg , calories 30% from fat and 7% or less from saturated fats, daily to have 5 or more servings of fruits and vegetables.  Biometrics:  Pre Biometrics - 04/17/24 1212       Pre Biometrics   Grip Strength 12 kg  Nutrition Therapy Plan and Nutrition Goals:  Nutrition Therapy & Goals - 04/30/24 1356       Nutrition Therapy   Diet Low Sodium Diet    Drug/Food Interactions Statins/Certain Fruits      Personal Nutrition Goals   Nutrition Goal Patient to improve diet quality by using the plate method as a guide for meal planning to include lean protein/plant protein, fruits, vegetables, whole grains, nonfat dairy as part of a well-balanced diet.    Personal Goal #2 Patient to identify strategies for weight loss with goal of 0.5-2 # per week of weight loss.    Comments Patient with history of chronic systolic heart failure, DM2, CKD Stage IIIb. Pt reports limiting highly processed foods and increasing intake of fruits/veggies in order to limit sodium intake and promote wt loss. Notes eating ~ 2 meals daily; decreased appetite since starting Ozempic . Pt reports 12 lb wt loss over past 6 months. Reports blood glucose  well-controlled with fasting blood glucose of 123 this am. Typically does not eat breakfast. RD encouraged pt to consume snack with carb and small amount of protein prior to attending rehab to help prevent hypoglycemia. RD also encouraged pt to incorporate 2-3 snacks daily to help maintain healthy rate of wt loss and minimize muscle loss. Patient will benefit from participation in pulmonary rehab for nutrition education, exercise, and lifestyle modification.      Intervention Plan   Intervention Prescribe, educate and counsel regarding individualized specific dietary modifications aiming towards targeted core components such as weight, hypertension, lipid management, diabetes, heart failure and other comorbidities.    Expected Outcomes Short Term Goal: Understand basic principles of dietary content, such as calories, fat, sodium, cholesterol and nutrients.;Long Term Goal: Adherence to prescribed nutrition plan.          Nutrition Assessments:  MEDIFICTS Score Key: >=70 Need to make dietary changes  40-70 Heart Healthy Diet <= 40 Therapeutic Level Cholesterol Diet  Flowsheet Row PULMONARY REHAB OTHER RESPIRATORY from 05/05/2024 in [facility]  Picture Your Plate Total Score on Admission 70   Picture Your Plate Scores: <59 Unhealthy dietary pattern with much room for improvement. 41-50 Dietary pattern unlikely to meet recommendations for good health and room for improvement. 51-60 More healthful dietary pattern, with some room for improvement.  >60 Healthy dietary pattern, although there may be some specific behaviors that could be improved.    Nutrition Goals Re-Evaluation:   Nutrition Goals Discharge (Final Nutrition Goals Re-Evaluation):   Psychosocial: Target Goals: Acknowledge presence or absence of significant depression and/or stress, maximize coping skills, provide positive support system. Participant is able to  verbalize types and ability to use techniques and skills needed for reducing stress and depression.  Initial Review & Psychosocial Screening:  Initial Psych Review & Screening - 04/17/24 1049       Initial Review   Current issues with None Identified      Family Dynamics   Good Support System? Yes    Comments Pt has support from her 2 sons      Barriers   Psychosocial barriers to participate in program There are no identifiable barriers or psychosocial needs.      Screening Interventions   Interventions Encouraged to exercise          Quality of Life Scores:  Scores of 19 and below usually indicate a poorer quality of life in these areas.  A difference of  2-3 points is  a clinically meaningful difference.  A difference of 2-3 points in the total score of the Quality of Life Index has been associated with significant improvement in overall quality of life, self-image, physical symptoms, and general health in studies assessing change in quality of life.  PHQ-9: Review Flowsheet  More data exists      04/17/2024 03/11/2024 07/31/2023 07/22/2023 10/16/2022  Depression screen PHQ 2/9  Decreased Interest 0 0 0 0 0  Down, Depressed, Hopeless 0 0 0 0 0  PHQ - 2 Score 0 0 0 0 0  Altered sleeping 0 0 0 - -  Tired, decreased energy 0 0 0 - -  Change in appetite 0 0 0 - -  Feeling bad or failure about yourself  0 0 0 - -  Trouble concentrating 0 0 0 - -  Moving slowly or fidgety/restless 0 0 0 - -  Suicidal thoughts 0 0 0 - -  PHQ-9 Score 0 0  0  - -  Difficult doing work/chores Not difficult at all Not difficult at all Not difficult at all - -    Details       Data saved with a previous flowsheet row definition        Interpretation of Total Score  Total Score Depression Severity:  1-4 = Minimal depression, 5-9 = Mild depression, 10-14 = Moderate depression, 15-19 = Moderately severe depression, 20-27 = Severe depression   Psychosocial Evaluation and Intervention:   Psychosocial Evaluation - 04/17/24 1049       Psychosocial Evaluation & Interventions   Interventions Encouraged to exercise with the program and follow exercise prescription    Comments Emily Wheeler denies any psy/soc barriers or concerns at this time.    Expected Outcomes For Emily Wheeler to participate in PR free of any psy/soc barriers or concerns    Continue Psychosocial Services  No Follow up required          Psychosocial Re-Evaluation:  Psychosocial Re-Evaluation     Row Name 04/20/24 1045 05/13/24 1438           Psychosocial Re-Evaluation   Current issues with None Identified None Identified      Comments Monthly psychosocial re-evaluation as follows: No changes since orientation. Emily Wheeler is scheduled to start the program next week. Monthly psychosocial re-evaluation as follows: Emily Wheeler has been in the program for 2 weeks. She is enjoying the class and is hopeful to build up endurance and stamina. She denies needing any psy/soc resources or referrals. She states she has good support from her 2 sons and friends.      Expected Outcomes For Emily Wheeler to participate in PR free of any psy/soc barriers or concerns For Emily Wheeler to participate in PR free of any psy/soc barriers or concerns      Interventions Encouraged to attend Pulmonary Rehabilitation for the exercise Encouraged to attend Pulmonary Rehabilitation for the exercise      Continue Psychosocial Services  No Follow up required No Follow up required         Psychosocial Discharge (Final Psychosocial Re-Evaluation):  Psychosocial Re-Evaluation - 05/13/24 1438       Psychosocial Re-Evaluation   Current issues with None Identified    Comments Monthly psychosocial re-evaluation as follows: Emily Wheeler has been in the program for 2 weeks. She is enjoying the class and is hopeful to build up endurance and stamina. She denies needing any psy/soc resources or referrals. She states she has good support from her 2 sons and friends.  Expected  Outcomes For Emily Wheeler to participate in PR free of any psy/soc barriers or concerns    Interventions Encouraged to attend Pulmonary Rehabilitation for the exercise    Continue Psychosocial Services  No Follow up required          Education: Education Goals: Education classes will be provided on a weekly basis, covering required topics. Participant will state understanding/return demonstration of topics presented.  Learning Barriers/Preferences:  Learning Barriers/Preferences - 04/17/24 1105       Learning Barriers/Preferences   Learning Barriers Sight    Learning Preferences None          Education Topics: Know Your Numbers Group instruction that is supported by a PowerPoint presentation. Instructor discusses importance of knowing and understanding resting, exercise, and post-exercise oxygen saturation, heart rate, and blood pressure. Oxygen saturation, heart rate, blood pressure, rating of perceived exertion, and dyspnea are reviewed along with a normal range for these values.  Flowsheet Row PULMONARY REHAB OTHER RESPIRATORY from 05/14/2024 in [facility] for Heart, Vascular, & Lung Health  Date 05/14/24  Educator EP  Instruction Review Code 1- Verbalizes Understanding    Exercise for the Pulmonary Patient Group instruction that is supported by a PowerPoint presentation. Instructor discusses benefits of exercise, core components of exercise, frequency, duration, and intensity of an exercise routine, importance of utilizing pulse oximetry during exercise, safety while exercising, and options of places to exercise outside of rehab.  Flowsheet Row PULMONARY REHAB OTHER RESPIRATORY from 05/07/2024 in [facility] for Heart, Vascular, & Lung Health  Date 05/07/24  Educator EP  Instruction Review Code 1- Verbalizes Understanding    MET Level  Group instruction provided by PowerPoint, verbal discussion, and written material to support  subject matter. Instructor reviews what METs are and how to increase METs.    Pulmonary Medications Verbally interactive group education provided by instructor with focus on inhaled medications and proper administration. Flowsheet Row PULMONARY REHAB OTHER RESPIRATORY from 04/30/2024 in [facility] for Heart, Vascular, & Lung Health  Date 04/30/24  Educator RT  Instruction Review Code 1- Verbalizes Understanding    Anatomy and Physiology of the Respiratory System Group instruction provided by PowerPoint, verbal discussion, and written material to support subject matter. Instructor reviews respiratory cycle and anatomical components of the respiratory system and their functions. Instructor also reviews differences in obstructive and restrictive respiratory diseases with examples of each.    Oxygen Safety Group instruction provided by PowerPoint, verbal discussion, and written material to support subject matter. There is an overview of What is Oxygen and Why do we need it.  Instructor also reviews how to create a safe environment for oxygen use, the importance of using oxygen as prescribed, and the risks of noncompliance. There is a brief discussion on traveling with oxygen and resources the patient may utilize.   Oxygen Use Group instruction provided by PowerPoint, verbal discussion, and written material to discuss how supplemental oxygen is prescribed and different types of oxygen supply systems. Resources for more information are provided.    Breathing Techniques Group instruction that is supported by demonstration and informational handouts. Instructor discusses the benefits of pursed lip and diaphragmatic breathing and detailed demonstration on how to perform both.     Risk Factor Reduction Group instruction that is supported by a PowerPoint presentation. Instructor discusses the definition of a risk factor, different risk factors for pulmonary disease, and  how the heart and lungs work together.  Pulmonary Diseases Group instruction provided by PowerPoint, verbal discussion, and written material to support subject matter. Instructor gives an overview of the different type of pulmonary diseases. There is also a discussion on risk factors and symptoms as well as ways to manage the diseases.   Stress and Energy Conservation Group instruction provided by PowerPoint, verbal discussion, and written material to support subject matter. Instructor gives an overview of stress and the impact it can have on the body. Instructor also reviews ways to reduce stress. There is also a discussion on energy conservation and ways to conserve energy throughout the day.   Warning Signs and Symptoms Group instruction provided by PowerPoint, verbal discussion, and written material to support subject matter. Instructor reviews warning signs and symptoms of stroke, heart attack, cold and flu. Instructor also reviews ways to prevent the spread of infection.   Other Education Group or individual verbal, written, or video instructions that support the educational goals of the pulmonary rehab program.    Knowledge Questionnaire Score:  Knowledge Questionnaire Score - 04/17/24 1053       Knowledge Questionnaire Score   Pre Score 14/18          Core Components/Risk Factors/Patient Goals at Admission:  Personal Goals and Risk Factors at Admission - 04/17/24 1106       Core Components/Risk Factors/Patient Goals on Admission    Weight Management Yes;Weight Loss    Intervention Weight Management: Develop a combined nutrition and exercise program designed to reach desired caloric intake, while maintaining appropriate intake of nutrient and fiber, sodium and fats, and appropriate energy expenditure required for the weight goal.;Weight Management: Provide education and appropriate resources to help participant work on and attain dietary goals.;Weight Management/Obesity:  Establish reasonable short term and long term weight goals.;Obesity: Provide education and appropriate resources to help participant work on and attain dietary goals.    Expected Outcomes Short Term: Continue to assess and modify interventions until short term weight is achieved;Long Term: Adherence to nutrition and physical activity/exercise program aimed toward attainment of established weight goal;Weight Loss: Understanding of general recommendations for a balanced deficit meal plan, which promotes 1-2 lb weight loss per week and includes a negative energy balance of [phone] kcal/d;Understanding recommendations for meals to include 15-35% energy as protein, 25-35% energy from fat, 35-60% energy from carbohydrates, less than 200mg  of dietary cholesterol, 20-35 gm of total fiber daily;Understanding of distribution of calorie intake throughout the day with the consumption of 4-5 meals/snacks    Improve shortness of breath with ADL's Yes    Intervention Provide education, individualized exercise plan and daily activity instruction to help decrease symptoms of SOB with activities of daily living.    Expected Outcomes Short Term: Improve cardiorespiratory fitness to achieve a reduction of symptoms when performing ADLs;Long Term: Be able to perform more ADLs without symptoms or delay the onset of symptoms    Heart Failure Yes    Intervention Provide a combined exercise and nutrition program that is supplemented with education, support and counseling about heart failure. Directed toward relieving symptoms such as shortness of breath, decreased exercise tolerance, and extremity edema.    Expected Outcomes Improve functional capacity of life;Short term: Attendance in program 2-3 days a week with increased exercise capacity. Reported lower sodium intake. Reported increased fruit and vegetable intake. Reports medication compliance.;Short term: Daily weights obtained and reported for increase. Utilizing diuretic  protocols set by physician.;Long term: Adoption of self-care skills and reduction of barriers for early signs and symptoms recognition  and intervention leading to self-care maintenance.          Core Components/Risk Factors/Patient Goals Review:   Goals and Risk Factor Review     Row Name 04/20/24 1046 05/13/24 1439           Core Components/Risk Factors/Patient Goals Review   Personal Goals Review Weight Management/Obesity;Improve shortness of breath with ADL's;Heart Failure Weight Management/Obesity;Improve shortness of breath with ADL's;Heart Failure;Develop more efficient breathing techniques such as purse lipped breathing and diaphragmatic breathing and practicing self-pacing with activity.      Review Monthly review of patient's Core Components/Risk Factors/Patient Goals are as follows:  Unable to assess goals yet. Emily Wheeler is schedule to start the program next week Monthly review of patient's Core Components/Risk Factors/Patient Goals are as follows:  Goal in progress for improving her shortness of breath with ADLs. Emily Wheeler is trying to build up her strength and endurance. She has completed 4 sessions so far and is exercising on the NuStep. She is trying to build up her stamina to start walking the track. Her oxygen saturation has been stable on room air. Goal progressing for developing more efficient breathing techniques such as purse lipped breathing and diaphragmatic breathing; and practicing self-pacing with activity. Emily Wheeler needs to be prompted to perform purse lipped breathing while short of breath. We are working on this with her while performing the warmup and while exercising. She is working on diaphragmatic breathing at home. Goal progressing on weight loss. Emily Wheeler is working with our dietitian on choosing a wide variety of foods and incorporating more fruits and vegetables to lose weight. She is currently down ~9.5# since beginning the program. Goal progressing on reducing heart  failure exacerbations and reducing signs and symptoms. Emily Wheeler is medication compliant, weighs herself daily and follows a low sodium diet. Emily Wheeler will continue to benefit from PR for nutrition, education, exercise, and lifestyle modification.      Expected Outcomes Pt will show progress toward meeting expected goals and outcomes. Pt will show progress toward meeting expected goals and outcomes.         Core Components/Risk Factors/Patient Goals at Discharge (Final Review):   Goals and Risk Factor Review - 05/13/24 1439       Core Components/Risk Factors/Patient Goals Review   Personal Goals Review Weight Management/Obesity;Improve shortness of breath with ADL's;Heart Failure;Develop more efficient breathing techniques such as purse lipped breathing and diaphragmatic breathing and practicing self-pacing with activity.    Review Monthly review of patient's Core Components/Risk Factors/Patient Goals are as follows:  Goal in progress for improving her shortness of breath with ADLs. Emily Wheeler is trying to build up her strength and endurance. She has completed 4 sessions so far and is exercising on the NuStep. She is trying to build up her stamina to start walking the track. Her oxygen saturation has been stable on room air. Goal progressing for developing more efficient breathing techniques such as purse lipped breathing and diaphragmatic breathing; and practicing self-pacing with activity. Emily Wheeler needs to be prompted to perform purse lipped breathing while short of breath. We are working on this with her while performing the warmup and while exercising. She is working on diaphragmatic breathing at home. Goal progressing on weight loss. Emily Wheeler is working with our dietitian on choosing a wide variety of foods and incorporating more fruits and vegetables to lose weight. She is currently down ~9.5# since beginning the program. Goal progressing on reducing heart failure exacerbations and reducing signs and  symptoms. Emily Wheeler is medication compliant, weighs  herself daily and follows a low sodium diet. Emily Wheeler will continue to benefit from PR for nutrition, education, exercise, and lifestyle modification.    Expected Outcomes Pt will show progress toward meeting expected goals and outcomes.          ITP Comments:   Comments: Pt is making expected progress toward Pulmonary Rehab goals after completing 6 session(s). Recommend continued exercise, life style modification, education, and utilization of breathing techniques to increase stamina and strength, while also decreasing shortness of breath with exertion.  Dr. Slater Staff is Medical Director for Pulmonary Rehab at [facility].       [1]  Current Outpatient Medications:    Accu-Chek FastClix Lancets MISC, USE 1  TO CHECK GLUCOSE THREE TIMES DAILY, Disp: 306 each, Rfl: 0   allopurinol  (ZYLOPRIM ) 300 MG tablet, Take 1 tablet by mouth once daily, Disp: 30 tablet, Rfl: 0   aspirin  EC 81 MG tablet, Take 1 tablet (81 mg total) by mouth daily., Disp: 90 tablet, Rfl: 1   atorvastatin  (LIPITOR) 80 MG tablet, Take 1 tablet (80 mg total) by mouth daily., Disp: 90 tablet, Rfl: 3   Blood Glucose Monitoring Suppl (ACCU-CHEK GUIDE) w/Device KIT, 1 Act by Does not apply route 3 (three) times daily., Disp: 2 kit, Rfl: 2   calcium  carbonate (OS-CAL) 600 MG TABS tablet, Take 600 mg by mouth daily., Disp: , Rfl:    carvedilol  (COREG ) 12.5 MG tablet, TAKE 1 TABLET BY MOUTH TWICE DAILY. TAKE AN EXTRA 1/2 (ONE-HALF) TABLET AS NEEDED FOR PALPITATIONS, Disp: 225 tablet, Rfl: 0   citalopram  (CELEXA ) 20 MG tablet, Take 1 tablet by mouth once daily, Disp: 90 tablet, Rfl: 0   esomeprazole  (NEXIUM ) 40 MG capsule, Take 1 capsule (40 mg total) by mouth 2 (two) times daily., Disp: 180 capsule, Rfl: 0   FARXIGA  10 MG TABS tablet, TAKE 1 TABLET BY MOUTH ONCE DAILY BEFORE BREAKFAST, Disp: 90 tablet, Rfl: 0   Finerenone  (KERENDIA ) 10 MG TABS, Take 1 tablet (10 mg total)  by mouth daily. (Patient not taking: Reported on 04/17/2024), Disp: 90 tablet, Rfl: 0   furosemide  (LASIX ) 40 MG tablet, Take 1 tablet (40 mg total) by mouth as needed. For weight gain of 3lb in 24 hours, 5lb in a week, Disp: 90 tablet, Rfl: 3   gabapentin  (NEURONTIN ) 600 MG tablet, Take 0.5 tablets (300 mg total) by mouth 3 (three) times daily., Disp: 270 tablet, Rfl: 0   glucose blood (ACCU-CHEK GUIDE) test strip, 1 each by Other route 3 (three) times daily., Disp: 300 each, Rfl: 1   leflunomide  (ARAVA ) 20 MG tablet, Take 1 tablet by mouth once daily, Disp: 30 tablet, Rfl: 0   levalbuterol  (XOPENEX  HFA) 45 MCG/ACT inhaler, Inhale 2 puffs into the lungs every 8 (eight) hours as needed for wheezing., Disp: 3 each, Rfl: 3   levocetirizine (XYZAL ) 5 MG tablet, Take 1 tablet (5 mg total) by mouth every evening for 14 days. (Patient not taking: Reported on 04/17/2024), Disp: 14 tablet, Rfl: 0   Omega-3 Fatty Acids (FISH OIL) 500 MG CAPS, Take 500 mg by mouth daily., Disp: , Rfl:    OZEMPIC , 1 MG/DOSE, 4 MG/3ML SOPN, INJECT 1 MG SUBCUTANEOUSLY  ONCE A WEEK, Disp: 9 mL, Rfl: 0   Polyethylene Glycol 400 (BLINK TEARS) 0.25 % SOLN, Place 1 drop into both eyes daily as needed (Dry eyes)., Disp: , Rfl:    RELION PEN NEEDLES 32G X 4 MM MISC, USE 1  SUBCUTANEOUSLY ONCE  A WEEK, Disp: 50 each, Rfl: 0   sacubitril -valsartan  (ENTRESTO ) 49-51 MG, Take 1 tablet by mouth twice daily, Disp: 180 tablet, Rfl: 0 [2]  Social History Tobacco Use  Smoking Status Former   Passive exposure: Current  Smokeless Tobacco Never

## 2024-05-26 ENCOUNTER — Encounter (HOSPITAL_COMMUNITY)
Admission: RE | Admit: 2024-05-26 | Discharge: 2024-05-26 | Disposition: A | Discharge status: Home / Self Care | Source: Ambulatory Visit | Attending: Cardiology | Admitting: Cardiology

## 2024-05-26 DIAGNOSIS — I5022 Chronic systolic (congestive) heart failure: Secondary | ICD-10-CM | POA: Diagnosis not present

## 2024-05-26 NOTE — Progress Notes (Signed)
 Daily Session Note  Patient Details  Name: Emily Wheeler MRN: [id number] Date of Birth: March 28, 1956 Referring Provider:   [name] Pulmonary Rehab Walk Test from 04/17/2024 in [facility] for Heart, Vascular, & Lung Health  Referring Provider [name]    Encounter Date: 05/26/2024  Check In:  Session Check In - 05/26/24 1510       Check-In   Supervising physician immediately available to respond to emergencies [facility] MD immediately available    Physician(s) Orren Fabry, PA    Location [facility]    Staff Present Ronal Levin, RN, BSN;Randi Midge BS, ACSM-CEP, Exercise Physiologist;Kaylee Nicholaus, MS, ACSM-CEP, Exercise Physiologist    Virtual Visit No    Medication changes reported     No    Fall or balance concerns reported    Yes    Comments pt uses cane to walk    Tobacco Cessation No Change    Warm-up and Cool-down Performed as group-led instruction    Resistance Training Performed Yes    VAD Patient? No    PAD/SET Patient? No      Pain Assessment   Currently in Pain? No/denies    Multiple Pain Sites No          Capillary Blood Glucose: No results found for this or any previous visit (from the past 24 hours).    Tobacco Use History[1]  Goals Met:  Exercise tolerated well Queuing for purse lip breathing No report of concerns or symptoms today Strength training completed today  Goals Unmet:  Not Applicable  Comments: Service time is from 1318 to 1438    Dr. Slater Staff is Medical Director for Pulmonary Rehab at [facility].     [1]  Social History Tobacco Use  Smoking Status Former   Passive exposure: Current  Smokeless Tobacco Never

## 2024-05-29 ENCOUNTER — Other Ambulatory Visit: Payer: Self-pay | Admitting: Internal Medicine

## 2024-05-29 DIAGNOSIS — I5020 Unspecified systolic (congestive) heart failure: Secondary | ICD-10-CM

## 2024-06-02 ENCOUNTER — Encounter (HOSPITAL_COMMUNITY)
Admission: RE | Admit: 2024-06-02 | Discharge: 2024-06-02 | Disposition: A | Discharge status: Home / Self Care | Source: Ambulatory Visit | Attending: Cardiology | Admitting: Cardiology

## 2024-06-02 VITALS — Wt 218.9 lb

## 2024-06-02 DIAGNOSIS — I5022 Chronic systolic (congestive) heart failure: Secondary | ICD-10-CM | POA: Diagnosis present

## 2024-06-02 NOTE — Progress Notes (Addendum)
 Daily Session Note  Patient Details  Name: Emily Wheeler MRN: [id number] Date of Birth: [birth year] Referring Provider:   [name] Pulmonary Rehab Walk Test from 04/17/2024 in [facility] for Heart, Vascular, & Lung Health  Referring Provider [name]    Encounter Date: 06/02/2024  Check In:  Session Check In - 06/02/24 1321       Check-In   Supervising physician immediately available to respond to emergencies [facility] MD immediately available    Physician(s) Rosabel Mose, NP    Location [facility]    Staff Present Ronal Levin, RN, BSN;Randi Midge BS, ACSM-CEP, Exercise Physiologist;Kaylee Nicholaus, MS, ACSM-CEP, Exercise Physiologist;Ayuub Penley Claudene, RT    Virtual Visit No    Medication changes reported     No    Fall or balance concerns reported    Yes    Comments pt uses cane to walk    Tobacco Cessation No Change    Warm-up and Cool-down Performed as group-led instruction    Resistance Training Performed Yes    VAD Patient? No    PAD/SET Patient? No      Pain Assessment   Currently in Pain? No/denies    Multiple Pain Sites No          Capillary Blood Glucose: No results found for this or any previous visit (from the past 24 hours).   Exercise Prescription Changes - 06/02/24 1300       Response to Exercise   Blood Pressure (Admit) 100/64    Blood Pressure (Exercise) 128/70    Blood Pressure (Exit) 96/62    Heart Rate (Admit) 93 bpm    Heart Rate (Exercise) 136 bpm    Heart Rate (Exit) 102 bpm    Oxygen Saturation (Admit) 99 %    Oxygen Saturation (Exercise) 96 %    Oxygen Saturation (Exit) 96 %    Rating of Perceived Exertion (Exercise) 12    Perceived Dyspnea (Exercise) 3    Duration Continue with 30 min of aerobic exercise without signs/symptoms of physical distress.    Intensity THRR unchanged      Progression   Progression Continue to progress workloads to maintain intensity without signs/symptoms of physical  distress.      Resistance Training   Weight red bands    Reps 10-15    Time 10 Minutes      NuStep   Level 2    SPM 113    Minutes 15    METs 2.7      Track   Laps 4   262ft track   Minutes 15    METs 1.62          Tobacco Use History[1]  Goals Met:  Proper associated with RPD/PD & O2 Sat Independence with exercise equipment Exercise tolerated well No report of concerns or symptoms today Strength training completed today  Goals Unmet:  Not Applicable  Comments: Service time is from 1315 to 1435. Pt. Walking track and stated knee gave out. Pt fell on her bottom, Katlyn West notified. No injuries.     Dr. Slater Staff is Medical Director for Pulmonary Rehab at [facility].      [1]  Social History Tobacco Use  Smoking Status Former   Passive exposure: Current  Smokeless Tobacco Never

## 2024-06-04 ENCOUNTER — Encounter (HOSPITAL_COMMUNITY)

## 2024-06-04 ENCOUNTER — Telehealth (HOSPITAL_COMMUNITY): Payer: Self-pay

## 2024-06-04 NOTE — Telephone Encounter (Signed)
 Patient c/o for 1:15 PR class, states she is still sore from the last class.

## 2024-06-05 ENCOUNTER — Other Ambulatory Visit: Payer: Self-pay | Admitting: Internal Medicine

## 2024-06-08 ENCOUNTER — Other Ambulatory Visit: Payer: Self-pay | Admitting: Internal Medicine

## 2024-06-08 DIAGNOSIS — M069 Rheumatoid arthritis, unspecified: Secondary | ICD-10-CM

## 2024-06-09 ENCOUNTER — Encounter (HOSPITAL_COMMUNITY)
Admission: RE | Admit: 2024-06-09 | Discharge: 2024-06-09 | Disposition: A | Source: Ambulatory Visit | Attending: Cardiology

## 2024-06-09 DIAGNOSIS — I5022 Chronic systolic (congestive) heart failure: Secondary | ICD-10-CM | POA: Diagnosis not present

## 2024-06-09 NOTE — Progress Notes (Signed)
 Daily Session Note  Patient Details  Name: Emily Wheeler MRN: [id number] Date of Birth: Aug 11, 1955 Referring Provider:   [name] Pulmonary Rehab Walk Test from 04/17/2024 in [facility] for Heart, Vascular, & Lung Health  Referring Provider [name]    Encounter Date: 06/09/2024  Check In:  Session Check In - 06/09/24 1405       Check-In   Supervising physician immediately available to respond to emergencies [facility] MD immediately available    Physician(s) Rosaline Skains, NP    Location [facility]    Staff Present Ronal Levin, RN, BSN;Randi Midge BS, ACSM-CEP, Exercise Physiologist;Kaylee Nicholaus, MS, ACSM-CEP, Exercise Physiologist;Casey Claudene West Quan, RN, BSN    Virtual Visit No    Medication changes reported     No    Fall or balance concerns reported    Yes    Comments pt uses cane to walk    Tobacco Cessation No Change    Warm-up and Cool-down Performed as group-led instruction    Resistance Training Performed Yes    VAD Patient? No    PAD/SET Patient? No      Pain Assessment   Currently in Pain? No/denies    Multiple Pain Sites No          Capillary Blood Glucose: No results found for this or any previous visit (from the past 24 hours).    Tobacco Use History[1]  Goals Met:  Independence with exercise equipment Exercise tolerated well Queuing for purse lip breathing No report of concerns or symptoms today Strength training completed today  Goals Unmet:  Not Applicable  Comments: Service time is from 1306 to 1439    Dr. Slater Staff is Medical Director for Pulmonary Rehab at [facility].     [1]  Social History Tobacco Use  Smoking Status Former   Passive exposure: Current  Smokeless Tobacco Never

## 2024-06-11 ENCOUNTER — Encounter (HOSPITAL_COMMUNITY)
Admission: RE | Admit: 2024-06-11 | Discharge: 2024-06-11 | Disposition: A | Source: Ambulatory Visit | Attending: Cardiology

## 2024-06-11 DIAGNOSIS — I5022 Chronic systolic (congestive) heart failure: Secondary | ICD-10-CM | POA: Diagnosis not present

## 2024-06-11 NOTE — Progress Notes (Signed)
 Daily Session Note  Patient Details  Name: Emily Wheeler MRN: [id number] Date of Birth: 1955/11/03 Referring Provider:   [name] Pulmonary Rehab Walk Test from 04/17/2024 in [facility] for Heart, Vascular, & Lung Health  Referring Provider [name]    Encounter Date: 06/11/2024  Check In:  Session Check In - 06/11/24 1333       Check-In   Supervising physician immediately available to respond to emergencies [facility] MD immediately available    Physician(s) Lum Louis, NP    Location [facility]    Staff Present Ronal Levin, RN, BSN;Randi Midge BS, ACSM-CEP, Exercise Physiologist;Kaylee Nicholaus, MS, ACSM-CEP, Exercise Physiologist;Xue Low Claudene, RT    Virtual Visit No    Medication changes reported     No    Fall or balance concerns reported    Yes    Comments pt uses cane to walk    Tobacco Cessation No Change    Warm-up and Cool-down Performed as group-led instruction    Resistance Training Performed Yes    VAD Patient? No    PAD/SET Patient? No      Pain Assessment   Currently in Pain? No/denies    Multiple Pain Sites No          Capillary Blood Glucose: No results found for this or any previous visit (from the past 24 hours).    Tobacco Use History[1]  Goals Met:  Proper associated with RPD/PD & O2 Sat Independence with exercise equipment Exercise tolerated well No report of concerns or symptoms today Strength training completed today  Goals Unmet:  Not Applicable  Comments: Service time is from 1303 to 1433.    Dr. Slater Staff is Medical Director for Pulmonary Rehab at [facility].     [1]  Social History Tobacco Use  Smoking Status Former   Passive exposure: Current  Smokeless Tobacco Never

## 2024-06-14 ENCOUNTER — Other Ambulatory Visit: Payer: Self-pay | Admitting: Internal Medicine

## 2024-06-15 ENCOUNTER — Other Ambulatory Visit: Payer: Self-pay | Admitting: Internal Medicine

## 2024-06-15 ENCOUNTER — Encounter: Payer: Self-pay | Admitting: Gastroenterology

## 2024-06-15 DIAGNOSIS — N1831 Chronic kidney disease, stage 3a: Secondary | ICD-10-CM

## 2024-06-15 DIAGNOSIS — E118 Type 2 diabetes mellitus with unspecified complications: Secondary | ICD-10-CM

## 2024-06-16 ENCOUNTER — Encounter (HOSPITAL_COMMUNITY)
Admission: RE | Admit: 2024-06-16 | Discharge: 2024-06-16 | Disposition: A | Source: Ambulatory Visit | Attending: Cardiology

## 2024-06-16 VITALS — Wt 219.1 lb

## 2024-06-16 DIAGNOSIS — I5022 Chronic systolic (congestive) heart failure: Secondary | ICD-10-CM

## 2024-06-16 NOTE — Progress Notes (Signed)
 Daily Session Note  Patient Details  Name: Emily Wheeler MRN: [id number] Date of Birth: 16-Jul-1955 Referring Provider:   [name] [facility] Walk Test from 04/17/2024 in [facility]  Referring Provider [name]    Encounter Date: 06/16/2024  Check In:  Session Check In - 06/16/24 1319       Check-In   Supervising physician immediately available to respond to emergencies [facility] MD immediately available    Physician(s) Lum Louis, NP    Location [facility]    Staff Present Ronal Levin, RN, BSN;Randi Midge BS, ACSM-CEP, Exercise Physiologist;Elisama Thissen Nicholaus, MS, ACSM-CEP, Exercise Physiologist;Casey Claudene, RT    Virtual Visit No    Medication changes reported     No    Fall or balance concerns reported    Yes    Comments pt uses cane to walk    Tobacco Cessation No Change    Warm-up and Cool-down Performed as group-led instruction    Resistance Training Performed Yes    VAD Patient? No    PAD/SET Patient? No      Pain Assessment   Currently in Pain? No/denies    Multiple Pain Sites No          Capillary Blood Glucose: No results found for this or any previous visit (from the past 24 hours).   Exercise Prescription Changes - 06/16/24 1400       Response to Exercise   Blood Pressure (Admit) 120/80    Blood Pressure (Exercise) 128/72    Blood Pressure (Exit) 116/70    Heart Rate (Admit) 102 bpm    Heart Rate (Exercise) 116 bpm    Heart Rate (Exit) 86 bpm    Oxygen Saturation (Admit) 99 %    Oxygen Saturation (Exercise) 98 %    Oxygen Saturation (Exit) 98 %    Rating of Perceived Exertion (Exercise) 14    Perceived Dyspnea (Exercise) 3    Duration Continue with 30 min of aerobic exercise without signs/symptoms of physical distress.    Intensity THRR unchanged      Progression   Progression Continue to progress workloads to maintain intensity without signs/symptoms of physical  distress.      Resistance Training   Weight red bands    Reps 10-15    Time 10 Minutes      NuStep   Level 3    SPM 118    Minutes 15    METs 2.9      Track   Laps 2.5    Minutes 15    METs 1.8          Tobacco Use History[1]  Goals Met:  Proper associated with RPD/PD & O2 Sat Independence with exercise equipment Exercise tolerated well No report of concerns or symptoms today Strength training completed today  Goals Unmet:  Not Applicable  Comments: Service time is from 1304 to 1433.    Dr. Slater Staff is Medical Director for Pulmonary Rehab at [facility].     [1]  Social History Tobacco Use  Smoking Status Former   Passive exposure: Current  Smokeless Tobacco Never

## 2024-06-17 NOTE — Progress Notes (Signed)
 Pulmonary Individual Treatment Plan  Patient Details  Name: Emily Wheeler MRN: [id number] Date of Birth: Jul 21, 1955 Referring Provider:   [name] Pulmonary Rehab Walk Test from 04/17/2024 in [facility]  Referring Provider [name]    Initial Encounter Date:  Flowsheet Row Pulmonary Rehab Walk Test from 04/17/2024 in [facility]  Date 04/17/24    Visit Diagnosis: Heart failure, chronic systolic (HCC)  Patient's Home Medications on Admission:  Current Medications[1]  Past Medical History: Past Medical History:  Diagnosis Date   Anemia    mild   Anxiety    Arthritis    Asthma    Cardiomyopathy (HCC)    CHF (congestive heart failure) (HCC)    Diabetes mellitus without complication (HCC)    Frequency of urination    at night   GERD (gastroesophageal reflux disease)    potassium worsens reflux   Hemorrhoids, external    Hyperlipidemia    Hypertension    Neuromuscular disorder (HCC)    neuropathy   Numbness and tingling of both legs    Rheumatoid arthritis (HCC)    Vertigo    hx of    Tobacco Use: Tobacco Use History[2]  Labs: Review Flowsheet  More data exists      Latest Ref Rng & Units 02/26/2023 03/07/2023 07/22/2023 08/21/2023 01/08/2024  Labs for ITP Cardiac and Pulmonary Rehab  Cholestrol 0 - 200 mg/dL 846  - - - -  LDL (calc) 0 - 99 mg/dL 76  - - - -  HDL-C >60.99 mg/dL 55.19  - - - -  Trlycerides 0.0 - 149.0 mg/dL 835.9  - - - -  Hemoglobin A1c 4.6 - 6.5 % - 6.1  6.1  - 7.2   Bicarbonate 20.0 - 28.0 mmol/L 20.0 - 28.0 mmol/L - - - 24.6  24.5  -  TCO2 22 - 32 mmol/L 22 - 32 mmol/L - - - 26  26  -  Acid-base deficit 0.0 - 2.0 mmol/L - - - 1.0  -  O2 Saturation % % - - - 63  62  -    Details       Multiple values from one day are sorted in reverse-chronological order         Capillary Blood Glucose: Lab Results   Component Value Date   GLUCAP 124 (H) 05/05/2024   GLUCAP 143 (H) 05/05/2024   GLUCAP 126 (H) 04/30/2024   GLUCAP 123 (H) 04/30/2024   GLUCAP 124 (H) 08/21/2023     Pulmonary Assessment Scores:  Pulmonary Assessment Scores     Row Name 04/17/24 1057         ADL UCSD   ADL Phase Entry     SOB Score total 69       CAT Score   CAT Score 19       mMRC Score   mMRC Score 4       UCSD: Self-administered rating of dyspnea associated with activities of daily living (ADLs) 6-point scale (0 = not at all to 5 = maximal or unable to do because of breathlessness)  Scoring Scores range from 0 to 120.  Minimally important difference is 5 units  CAT: CAT can identify the health impairment of COPD patients and is better correlated with disease progression.  CAT has a scoring range of zero to 40. The CAT score is classified into four groups of low (  less than 10), medium (10 - 20), high (21-30) and very high (31-40) based on the impact level of disease on health status. A CAT score over 10 suggests significant symptoms.  A worsening CAT score could be explained by an exacerbation, poor medication adherence, poor inhaler technique, or progression of COPD or comorbid conditions.  CAT MCID is 2 points  mMRC: mMRC (Modified Medical Research Council) Dyspnea Scale is used to assess the degree of baseline functional disability in patients of respiratory disease due to dyspnea. No minimal important difference is established. A decrease in score of 1 point or greater is considered a positive change.   Pulmonary Function Assessment:  Pulmonary Function Assessment - 04/17/24 1111       Breath   Bilateral Breath Sounds Clear    Shortness of Breath Yes;Limiting activity          Exercise Target Goals: Exercise Program Goal: Individual exercise prescription set using results from initial 6 min walk test and THRR while considering  patients activity barriers and safety.   Exercise  Prescription Goal: Initial exercise prescription builds to 30-45 minutes a day of aerobic activity, 2-3 days per week.  Home exercise guidelines will be given to patient during program as part of exercise prescription that the participant will acknowledge.  Activity Barriers & Risk Stratification:  Activity Barriers & Cardiac Risk Stratification - 04/17/24 1108       Activity Barriers & Cardiac Risk Stratification   Activity Barriers Deconditioning;Muscular Weakness;Shortness of Breath;Back Problems;Assistive Device;Arthritis;Balance Concerns;History of Falls    Cardiac Risk Stratification Moderate          6 Minute Walk:  6 Minute Walk     Row Name 04/17/24 1156         6 Minute Walk   Phase Initial     Distance 640 feet     Walk Time 6 minutes     # of Rest Breaks 4  1:30-1:51, 2:37-3:07, 3:43-4:12, 4:42-5:12     MPH 0.87     METS 1.71     RPE 17     Perceived Dyspnea  3     VO2 Peak 5.98     Symptoms No     Resting HR 77 bpm     Resting BP 122/68     Resting Oxygen Saturation  98 %     Exercise Oxygen Saturation  during 6 min walk 98 %     Max Ex. HR 155 bpm     Max Ex. BP 160/80     2 Minute Post BP 130/66       Interval HR   1 Minute HR 144     2 Minute HR 136     3 Minute HR 138     4 Minute HR 147     5 Minute HR 147     6 Minute HR 155     2 Minute Post HR 96     Interval Heart Rate? Yes       Interval Oxygen   Interval Oxygen? Yes     Baseline Oxygen Saturation % 98 %     1 Minute Oxygen Saturation % 94 %     1 Minute Liters of Oxygen 0 L     2 Minute Oxygen Saturation % 100 %     2 Minute Liters of Oxygen 0 L     3 Minute Oxygen Saturation % 100 %     3 Minute Liters of Oxygen 0  L     4 Minute Oxygen Saturation % 98 %     4 Minute Liters of Oxygen 0 L     5 Minute Oxygen Saturation % 99 %     5 Minute Liters of Oxygen 0 L     6 Minute Oxygen Saturation % 98 %     6 Minute Liters of Oxygen 0 L     2 Minute Post Oxygen Saturation % 99 %     2  Minute Post Liters of Oxygen 0 L        Oxygen Initial Assessment:  Oxygen Initial Assessment - 04/17/24 1108       Home Oxygen   Home Oxygen Device None    Sleep Oxygen Prescription None    Home Exercise Oxygen Prescription None    Home Resting Oxygen Prescription None      Initial 6 min Walk   Oxygen Used None      Program Oxygen Prescription   Program Oxygen Prescription None      Intervention   Short Term Goals To learn and understand importance of maintaining oxygen saturations>88%;To learn and demonstrate proper use of respiratory medications;To learn and understand importance of monitoring SPO2 with pulse oximeter and demonstrate accurate use of the pulse oximeter.;To learn and demonstrate proper pursed lip breathing techniques or other breathing techniques.     Long  Term Goals Maintenance of O2 saturations>88%;Compliance with respiratory medication;Verbalizes importance of monitoring SPO2 with pulse oximeter and return demonstration;Exhibits proper breathing techniques, such as pursed lip breathing or other method taught during program session;Demonstrates proper use of MDIs          Oxygen Re-Evaluation:  Oxygen Re-Evaluation     Row Name 04/21/24 0830 05/15/24 1501 06/08/24 1355         Program Oxygen Prescription   Program Oxygen Prescription None None None       Home Oxygen   Home Oxygen Device None None None     Sleep Oxygen Prescription None None None     Home Exercise Oxygen Prescription None None None     Home Resting Oxygen Prescription None None None       Goals/Expected Outcomes   Short Term Goals To learn and understand importance of maintaining oxygen saturations>88%;To learn and demonstrate proper use of respiratory medications;To learn and understand importance of monitoring SPO2 with pulse oximeter and demonstrate accurate use of the pulse oximeter.;To learn and demonstrate proper pursed lip breathing techniques or other breathing techniques.  To  learn and understand importance of maintaining oxygen saturations>88%;To learn and demonstrate proper use of respiratory medications;To learn and understand importance of monitoring SPO2 with pulse oximeter and demonstrate accurate use of the pulse oximeter.;To learn and demonstrate proper pursed lip breathing techniques or other breathing techniques.  To learn and understand importance of maintaining oxygen saturations>88%;To learn and demonstrate proper use of respiratory medications;To learn and understand importance of monitoring SPO2 with pulse oximeter and demonstrate accurate use of the pulse oximeter.;To learn and demonstrate proper pursed lip breathing techniques or other breathing techniques.      Long  Term Goals Maintenance of O2 saturations>88%;Compliance with respiratory medication;Verbalizes importance of monitoring SPO2 with pulse oximeter and return demonstration;Exhibits proper breathing techniques, such as pursed lip breathing or other method taught during program session;Demonstrates proper use of MDIs Maintenance of O2 saturations>88%;Compliance with respiratory medication;Verbalizes importance of monitoring SPO2 with pulse oximeter and return demonstration;Exhibits proper breathing techniques, such as pursed lip breathing or other method taught  during program session;Demonstrates proper use of MDIs Maintenance of O2 saturations>88%;Compliance with respiratory medication;Verbalizes importance of monitoring SPO2 with pulse oximeter and return demonstration;Exhibits proper breathing techniques, such as pursed lip breathing or other method taught during program session;Demonstrates proper use of MDIs     Goals/Expected Outcomes Compliance and understanding of oxygen saturation monitoring and breathing techniques to decrease shortness of breath. Compliance and understanding of oxygen saturation monitoring and breathing techniques to decrease shortness of breath. Compliance and understanding of  oxygen saturation monitoring and breathing techniques to decrease shortness of breath.        Oxygen Discharge (Final Oxygen Re-Evaluation):  Oxygen Re-Evaluation - 06/08/24 1355       Program Oxygen Prescription   Program Oxygen Prescription None      Home Oxygen   Home Oxygen Device None    Sleep Oxygen Prescription None    Home Exercise Oxygen Prescription None    Home Resting Oxygen Prescription None      Goals/Expected Outcomes   Short Term Goals To learn and understand importance of maintaining oxygen saturations>88%;To learn and demonstrate proper use of respiratory medications;To learn and understand importance of monitoring SPO2 with pulse oximeter and demonstrate accurate use of the pulse oximeter.;To learn and demonstrate proper pursed lip breathing techniques or other breathing techniques.     Long  Term Goals Maintenance of O2 saturations>88%;Compliance with respiratory medication;Verbalizes importance of monitoring SPO2 with pulse oximeter and return demonstration;Exhibits proper breathing techniques, such as pursed lip breathing or other method taught during program session;Demonstrates proper use of MDIs    Goals/Expected Outcomes Compliance and understanding of oxygen saturation monitoring and breathing techniques to decrease shortness of breath.          Initial Exercise Prescription:  Initial Exercise Prescription - 04/17/24 1200       Date of Initial Exercise RX and Referring Provider   Date 04/17/24    Referring Provider [name]    Expected Discharge Date 07/28/24      NuStep   Level 1    SPM 50    Minutes 30    METs 1.4      Prescription Details   Frequency (times per week) 2    Duration Progress to 30 minutes of continuous aerobic without signs/symptoms of physical distress      Intensity   THRR 40-80% of Max Heartrate 60-120    Ratings of Perceived Exertion 11-13    Perceived Dyspnea 0-4      Progression   Progression Continue to progress  workloads to maintain intensity without signs/symptoms of physical distress.      Resistance Training   Training Prescription Yes    Weight red bands    Reps 10-15          Perform Capillary Blood Glucose checks as needed.  Exercise Prescription Changes:   Exercise Prescription Changes     Row Name 05/05/24 1500 05/19/24 1500 06/02/24 1300 06/16/24 1400       Response to Exercise   Blood Pressure (Admit) 96/50 132/70 100/64 120/80    Blood Pressure (Exercise) 108/62 112/66 128/70 128/72    Blood Pressure (Exit) 110/60 90/60 96/62  116/70    Heart Rate (Admit) 57 bpm 100 bpm 93 bpm 102 bpm    Heart Rate (Exercise) 85 bpm 90 bpm 136 bpm 116 bpm    Heart Rate (Exit) 59 bpm 81 bpm 102 bpm 86 bpm    Oxygen Saturation (Admit) 98 % 97 % 99 % 99 %  Oxygen Saturation (Exercise) 97 % 97 % 96 % 98 %    Oxygen Saturation (Exit) 97 % 97 % 96 % 98 %    Rating of Perceived Exertion (Exercise) 11 12 12 14     Perceived Dyspnea (Exercise) 0 1 3 3     Duration Continue with 30 min of aerobic exercise without signs/symptoms of physical distress. Continue with 30 min of aerobic exercise without signs/symptoms of physical distress. Continue with 30 min of aerobic exercise without signs/symptoms of physical distress. Continue with 30 min of aerobic exercise without signs/symptoms of physical distress.    Intensity THRR unchanged THRR unchanged THRR unchanged THRR unchanged      Progression   Progression Continue to progress workloads to maintain intensity without signs/symptoms of physical distress. Continue to progress workloads to maintain intensity without signs/symptoms of physical distress. Continue to progress workloads to maintain intensity without signs/symptoms of physical distress. Continue to progress workloads to maintain intensity without signs/symptoms of physical distress.      Resistance Training   Training Prescription Yes -- -- --    Weight red bands red bands red bands red bands     Reps 10-15 10-15 10-15 10-15    Time 10 Minutes 10 Minutes 10 Minutes 10 Minutes      NuStep   Level 1 2 2 3     SPM 79 106 113 118    Minutes 30 30 15 15     METs 1.9 2.4 2.7 2.9      Track   Laps -- -- 4  270ft track 2.5    Minutes -- -- 15 15    METs -- -- 1.62 1.8       Exercise Comments:   Exercise Comments     Row Name 04/30/24 1508           Exercise Comments Pt completed first day of group exercise. She exercised on the recumbent stepper for 30 min, level 1, METs 1.5. Tolerated well and she was happy. She performed warm up and cool down intermittently sitting due to severe back issues. Discussed METs.          Exercise Goals and Review:   Exercise Goals     Row Name 04/17/24 1050             Exercise Goals   Increase Physical Activity Yes       Intervention Provide advice, education, support and counseling about physical activity/exercise needs.;Develop an individualized exercise prescription for aerobic and resistive training based on initial evaluation findings, risk stratification, comorbidities and participant's personal goals.       Expected Outcomes Short Term: Attend rehab on a regular basis to increase amount of physical activity.;Long Term: Exercising regularly at least 3-5 days a week.;Long Term: Add in home exercise to make exercise part of routine and to increase amount of physical activity.       Increase Strength and Stamina Yes       Intervention Provide advice, education, support and counseling about physical activity/exercise needs.;Develop an individualized exercise prescription for aerobic and resistive training based on initial evaluation findings, risk stratification, comorbidities and participant's personal goals.       Expected Outcomes Short Term: Increase workloads from initial exercise prescription for resistance, speed, and METs.;Short Term: Perform resistance training exercises routinely during rehab and add in resistance training at  home;Long Term: Improve cardiorespiratory fitness, muscular endurance and strength as measured by increased METs and functional capacity ( )  Able to understand and use rate of perceived exertion (RPE) scale Yes       Intervention Provide education and explanation on how to use RPE scale       Expected Outcomes Short Term: Able to use RPE daily in rehab to express subjective intensity level;Long Term:  Able to use RPE to guide intensity level when exercising independently       Able to understand and use Dyspnea scale Yes       Intervention Provide education and explanation on how to use Dyspnea scale       Expected Outcomes Short Term: Able to use Dyspnea scale daily in rehab to express subjective sense of shortness of breath during exertion;Long Term: Able to use Dyspnea scale to guide intensity level when exercising independently       Knowledge and understanding of Target Heart Rate Range (THRR) Yes       Intervention Provide education and explanation of THRR including how the numbers were predicted and where they are located for reference       Expected Outcomes Short Term: Able to state/look up THRR;Long Term: Able to use THRR to govern intensity when exercising independently;Short Term: Able to use daily as guideline for intensity in rehab       Understanding of Exercise Prescription Yes       Intervention Provide education, explanation, and written materials on patient's individual exercise prescription       Expected Outcomes Short Term: Able to explain program exercise prescription;Long Term: Able to explain home exercise prescription to exercise independently          Exercise Goals Re-Evaluation :  Exercise Goals Re-Evaluation     Row Name 04/21/24 0829 05/15/24 1459 06/08/24 1351         Exercise Goal Re-Evaluation   Exercise Goals Review Increase Physical Activity;Able to understand and use Dyspnea scale;Understanding of Exercise Prescription;Increase Strength and  Stamina;Knowledge and understanding of Target Heart Rate Range (THRR);Able to understand and use rate of perceived exertion (RPE) scale Increase Physical Activity;Able to understand and use Dyspnea scale;Understanding of Exercise Prescription;Increase Strength and Stamina;Knowledge and understanding of Target Heart Rate Range (THRR);Able to understand and use rate of perceived exertion (RPE) scale Increase Physical Activity;Able to understand and use Dyspnea scale;Understanding of Exercise Prescription;Increase Strength and Stamina;Knowledge and understanding of Target Heart Rate Range (THRR);Able to understand and use rate of perceived exertion (RPE) scale     Comments Pt to begin exercise 12/4. Will progress as tolerated. Pt has completed 5 days of group exercise. She has been exercising on the recumbent stepper for 30 min, level 2, METs 2.5. Will progress to walking the track for 15 min next session. She is motivated. Performs warm up and cool down, sitting for resistance training, using red bands, 3.7 lbs. Will progress as tolerated. Pt has completed 8 days of group exercise. Emily Wheeler began walking for 2 sessions. However she has significant back issues requiring rest breaks. She gets significant fatigue. The last session her knee buckled while she was walking and she fell. She did not get hurt. We are continuing to discuss the appropriateness of walking for exercise. She really wants to work on her walking. Will evaluate if she can walk 5-10 min next session. She is using the rollator. She also uses the recumbent stepper, 15 min, level 2, METs 2.7. Performs warm up and cool down, sitting for resistance training, using red bands, 3.7 lbs. Will progress as tolerated.     Expected  Outcomes Through exercise at rehab and at home, patient will increase physical capacity and ADL's will be easier to perform. The patient will also feel comfortable establishing a home exercise program. Through exercise at rehab and at  home, patient will increase physical capacity and ADL's will be easier to perform. The patient will also feel comfortable establishing a home exercise program. Through exercise at rehab and at home, patient will increase physical capacity and ADL's will be easier to perform. The patient will also feel comfortable establishing a home exercise program.        Discharge Exercise Prescription (Final Exercise Prescription Changes):  Exercise Prescription Changes - 06/16/24 1400       Response to Exercise   Blood Pressure (Admit) 120/80    Blood Pressure (Exercise) 128/72    Blood Pressure (Exit) 116/70    Heart Rate (Admit) 102 bpm    Heart Rate (Exercise) 116 bpm    Heart Rate (Exit) 86 bpm    Oxygen Saturation (Admit) 99 %    Oxygen Saturation (Exercise) 98 %    Oxygen Saturation (Exit) 98 %    Rating of Perceived Exertion (Exercise) 14    Perceived Dyspnea (Exercise) 3    Duration Continue with 30 min of aerobic exercise without signs/symptoms of physical distress.    Intensity THRR unchanged      Progression   Progression Continue to progress workloads to maintain intensity without signs/symptoms of physical distress.      Resistance Training   Weight red bands    Reps 10-15    Time 10 Minutes      NuStep   Level 3    SPM 118    Minutes 15    METs 2.9      Track   Laps 2.5    Minutes 15    METs 1.8          Nutrition:  Target Goals: Understanding of nutrition guidelines, daily intake of sodium 1500mg , cholesterol 200mg , calories 30% from fat and 7% or less from saturated fats, daily to have 5 or more servings of fruits and vegetables.  Biometrics:  Pre Biometrics - 04/17/24 1212       Pre Biometrics   Grip Strength 12 kg           Nutrition Therapy Plan and Nutrition Goals:  Nutrition Therapy & Goals - 04/30/24 1356       Nutrition Therapy   Diet Low Sodium Diet    Drug/Food Interactions Statins/Certain Fruits      Personal Nutrition Goals    Nutrition Goal Patient to improve diet quality by using the plate method as a guide for meal planning to include lean protein/plant protein, fruits, vegetables, whole grains, nonfat dairy as part of a well-balanced diet.    Personal Goal #2 Patient to identify strategies for weight loss with goal of 0.5-2 # per week of weight loss.    Comments Patient with history of chronic systolic heart failure, DM2, CKD Stage IIIb. Pt reports limiting highly processed foods and increasing intake of fruits/veggies in order to limit sodium intake and promote wt loss. Notes eating ~ 2 meals daily; decreased appetite since starting Ozempic . Pt reports 12 lb wt loss over past 6 months. Reports blood glucose well-controlled with fasting blood glucose of 123 this am. Typically does not eat breakfast. RD encouraged pt to consume snack with carb and small amount of protein prior to attending rehab to help prevent hypoglycemia. RD also encouraged pt to  incorporate 2-3 snacks daily to help maintain healthy rate of wt loss and minimize muscle loss. Patient will benefit from participation in pulmonary rehab for nutrition education, exercise, and lifestyle modification.      Intervention Plan   Intervention Prescribe, educate and counsel regarding individualized specific dietary modifications aiming towards targeted core components such as weight, hypertension, lipid management, diabetes, heart failure and other comorbidities.    Expected Outcomes Short Term Goal: Understand basic principles of dietary content, such as calories, fat, sodium, cholesterol and nutrients.;Long Term Goal: Adherence to prescribed nutrition plan.          Nutrition Assessments:  MEDIFICTS Score Key: >=70 Need to make dietary changes  40-70 Heart Healthy Diet <= 40 Therapeutic Level Cholesterol Diet  Flowsheet Row PULMONARY REHAB OTHER RESPIRATORY from 05/05/2024 in [facility]  Picture  Your Plate Total Score on Admission 70   Picture Your Plate Scores: <59 Unhealthy dietary pattern with much room for improvement. 41-50 Dietary pattern unlikely to meet recommendations for good health and room for improvement. 51-60 More healthful dietary pattern, with some room for improvement.  >60 Healthy dietary pattern, although there may be some specific behaviors that could be improved.    Nutrition Goals Re-Evaluation:  Nutrition Goals Re-Evaluation     Row Name 05/26/24 1437             Goals   Current Weight 218 lb (98.9 kg)       Nutrition Goal Patient to improve diet quality by using the plate method as a guide for meal planning to include lean protein/plant protein, fruits, vegetables, whole grains, nonfat dairy as part of a well-balanced diet.       Comment ~ 2 lb wt loss over past month       Expected Outcome Goals in action. Patient with history of chronic systolic heart failure, DM2, CKD Stage IIIb. Pt continues to limit highly processed foods. Weight continues to trend down. Appetite suppressed by Ozempic . Pt continues to consume 2 meals daily; reports adding small snack such as yogurt to help maintain adequate intake. Pt verbalizes understanding regarding healthy eating pattern; including lean source of protein such as chicken with meals. Patient will benefit from participation in pulmonary rehab for nutrition education, exercise, and lifestyle modification.         Personal Goal #2 Re-Evaluation   Personal Goal #2 Patient to identify strategies for weight loss with goal of 0.5-2 # per week of weight loss.          Nutrition Goals Discharge (Final Nutrition Goals Re-Evaluation):  Nutrition Goals Re-Evaluation - 05/26/24 1437       Goals   Current Weight 218 lb (98.9 kg)    Nutrition Goal Patient to improve diet quality by using the plate method as a guide for meal planning to include lean protein/plant protein, fruits, vegetables, whole grains, nonfat dairy as  part of a well-balanced diet.    Comment ~ 2 lb wt loss over past month    Expected Outcome Goals in action. Patient with history of chronic systolic heart failure, DM2, CKD Stage IIIb. Pt continues to limit highly processed foods. Weight continues to trend down. Appetite suppressed by Ozempic . Pt continues to consume 2 meals daily; reports adding small snack such as yogurt to help maintain adequate intake. Pt verbalizes understanding regarding healthy eating pattern; including lean source of protein such as chicken with meals. Patient will benefit from participation in  pulmonary rehab for nutrition education, exercise, and lifestyle modification.      Personal Goal #2 Re-Evaluation   Personal Goal #2 Patient to identify strategies for weight loss with goal of 0.5-2 # per week of weight loss.          Psychosocial: Target Goals: Acknowledge presence or absence of significant depression and/or stress, maximize coping skills, provide positive support system. Participant is able to verbalize types and ability to use techniques and skills needed for reducing stress and depression.  Initial Review & Psychosocial Screening:  Initial Psych Review & Screening - 04/17/24 1049       Initial Review   Current issues with None Identified      Family Dynamics   Good Support System? Yes    Comments Pt has support from her 2 sons      Barriers   Psychosocial barriers to participate in program There are no identifiable barriers or psychosocial needs.      Screening Interventions   Interventions Encouraged to exercise          Quality of Life Scores:  Scores of 19 and below usually indicate a poorer quality of life in these areas.  A difference of  2-3 points is a clinically meaningful difference.  A difference of 2-3 points in the total score of the Quality of Life Index has been associated with significant improvement in overall quality of life, self-image, physical symptoms, and general health  in studies assessing change in quality of life.  PHQ-9: Review Flowsheet  More data exists      04/17/2024 03/11/2024 07/31/2023 07/22/2023 10/16/2022  Depression screen PHQ 2/9  Decreased Interest 0 0 0 0 0  Down, Depressed, Hopeless 0 0 0 0 0  PHQ - 2 Score 0 0 0 0 0  Altered sleeping 0 0 0 - -  Tired, decreased energy 0 0 0 - -  Change in appetite 0 0 0 - -  Feeling bad or failure about yourself  0 0 0 - -  Trouble concentrating 0 0 0 - -  Moving slowly or fidgety/restless 0 0 0 - -  Suicidal thoughts 0 0 0 - -  PHQ-9 Score 0 0  0  - -  Difficult doing work/chores Not difficult at all Not difficult at all Not difficult at all - -    Details       Data saved with a previous flowsheet row definition        Interpretation of Total Score  Total Score Depression Severity:  1-4 = Minimal depression, 5-9 = Mild depression, 10-14 = Moderate depression, 15-19 = Moderately severe depression, 20-27 = Severe depression   Psychosocial Evaluation and Intervention:  Psychosocial Evaluation - 04/17/24 1049       Psychosocial Evaluation & Interventions   Interventions Encouraged to exercise with the program and follow exercise prescription    Comments Emily Wheeler denies any psy/soc barriers or concerns at this time.    Expected Outcomes For Emily Wheeler to participate in PR free of any psy/soc barriers or concerns    Continue Psychosocial Services  No Follow up required          Psychosocial Re-Evaluation:  Psychosocial Re-Evaluation     Row Name 04/20/24 1045 05/13/24 1438 06/16/24 9167         Psychosocial Re-Evaluation   Current issues with None Identified None Identified None Identified     Comments Monthly psychosocial re-evaluation as follows: No changes since orientation. Emily Wheeler is scheduled  to start the program next week. Monthly psychosocial re-evaluation as follows: Emily Wheeler has been in the program for 2 weeks. She is enjoying the class and is hopeful to build up endurance and  stamina. She denies needing any psy/soc resources or referrals. She states she has good support from her 2 sons and friends. Monthly psychosocial re-evaluation as follows: Emily Wheeler is enjoying the class and is hopeful to build up endurance and stamina. She denies needing any psy/soc resources or referrals. She states she has good support from her 2 sons and friends.     Expected Outcomes For Emily Wheeler to participate in PR free of any psy/soc barriers or concerns For Emily Wheeler to participate in PR free of any psy/soc barriers or concerns For Emily Wheeler to participate in PR free of any psy/soc barriers or concerns     Interventions Encouraged to attend Pulmonary Rehabilitation for the exercise Encouraged to attend Pulmonary Rehabilitation for the exercise Encouraged to attend Pulmonary Rehabilitation for the exercise     Continue Psychosocial Services  No Follow up required No Follow up required No Follow up required        Psychosocial Discharge (Final Psychosocial Re-Evaluation):  Psychosocial Re-Evaluation - 06/16/24 9167       Psychosocial Re-Evaluation   Current issues with None Identified    Comments Monthly psychosocial re-evaluation as follows: Emily Wheeler is enjoying the class and is hopeful to build up endurance and stamina. She denies needing any psy/soc resources or referrals. She states she has good support from her 2 sons and friends.    Expected Outcomes For Emily Wheeler to participate in PR free of any psy/soc barriers or concerns    Interventions Encouraged to attend Pulmonary Rehabilitation for the exercise    Continue Psychosocial Services  No Follow up required          Education: Education Goals: Education classes will be provided on a weekly basis, covering required topics. Participant will state understanding/return demonstration of topics presented.  Learning Barriers/Preferences:  Learning Barriers/Preferences - 04/17/24 1105       Learning Barriers/Preferences   Learning Barriers  Sight    Learning Preferences None          Education Topics: Know Your Numbers Group instruction that is supported by a PowerPoint presentation. Instructor discusses importance of knowing and understanding resting, exercise, and post-exercise oxygen saturation, heart rate, and blood pressure. Oxygen saturation, heart rate, blood pressure, rating of perceived exertion, and dyspnea are reviewed along with a normal range for these values.  Flowsheet Row PULMONARY REHAB OTHER RESPIRATORY from 05/14/2024 in [facility]  Date 05/14/24  Educator EP  Instruction Review Code 1- Verbalizes Understanding    Exercise for the Pulmonary Patient Group instruction that is supported by a PowerPoint presentation. Instructor discusses benefits of exercise, core components of exercise, frequency, duration, and intensity of an exercise routine, importance of utilizing pulse oximetry during exercise, safety while exercising, and options of places to exercise outside of rehab.  Flowsheet Row PULMONARY REHAB OTHER RESPIRATORY from 05/07/2024 in [facility]  Date 05/07/24  Educator EP  Instruction Review Code 1- Verbalizes Understanding    MET Level  Group instruction provided by PowerPoint, verbal discussion, and written material to support subject matter. Instructor reviews what METs are and how to increase METs.    Pulmonary Medications Verbally interactive group education provided by instructor with focus on inhaled medications and proper administration.  Flowsheet Row PULMONARY REHAB OTHER RESPIRATORY from 04/30/2024 in [facility]  Date 04/30/24  Educator RT  Instruction Review Code 1- Verbalizes Understanding    Anatomy and Physiology of the Respiratory System Group instruction provided by PowerPoint, verbal discussion, and  written material to support subject matter. Instructor reviews respiratory cycle and anatomical components of the respiratory system and their functions. Instructor also reviews differences in obstructive and restrictive respiratory diseases with examples of each.    Oxygen Safety Group instruction provided by PowerPoint, verbal discussion, and written material to support subject matter. There is an overview of What is Oxygen and Why do we need it.  Instructor also reviews how to create a safe environment for oxygen use, the importance of using oxygen as prescribed, and the risks of noncompliance. There is a brief discussion on traveling with oxygen and resources the patient may utilize.   Oxygen Use Group instruction provided by PowerPoint, verbal discussion, and written material to discuss how supplemental oxygen is prescribed and different types of oxygen supply systems. Resources for more information are provided.  Flowsheet Row PULMONARY REHAB OTHER RESPIRATORY from 06/11/2024 in [facility]  Date 06/11/24  Educator RT  Instruction Review Code 1- Verbalizes Understanding    Breathing Techniques Group instruction that is supported by demonstration and informational handouts. Instructor discusses the benefits of pursed lip and diaphragmatic breathing and detailed demonstration on how to perform both.     Risk Factor Reduction Group instruction that is supported by a PowerPoint presentation. Instructor discusses the definition of a risk factor, different risk factors for pulmonary disease, and how the heart and lungs work together.   Pulmonary Diseases Group instruction provided by PowerPoint, verbal discussion, and written material to support subject matter. Instructor gives an overview of the different type of pulmonary diseases. There is also a discussion on risk factors and symptoms as well as ways to manage the  diseases.   Stress and Energy Conservation Group instruction provided by PowerPoint, verbal discussion, and written material to support subject matter. Instructor gives an overview of stress and the impact it can have on the body. Instructor also reviews ways to reduce stress. There is also a discussion on energy conservation and ways to conserve energy throughout the day.   Warning Signs and Symptoms Group instruction provided by PowerPoint, verbal discussion, and written material to support subject matter. Instructor reviews warning signs and symptoms of stroke, heart attack, cold and flu. Instructor also reviews ways to prevent the spread of infection.   Other Education Group or individual verbal, written, or video instructions that support the educational goals of the pulmonary rehab program.    Knowledge Questionnaire Score:  Knowledge Questionnaire Score - 04/17/24 1053       Knowledge Questionnaire Score   Pre Score 14/18          Core Components/Risk Factors/Patient Goals at Admission:  Personal Goals and Risk Factors at Admission - 04/17/24 1106       Core Components/Risk Factors/Patient Goals on Admission    Weight Management Yes;Weight Loss    Intervention Weight Management: Develop a combined nutrition and exercise program designed to reach desired caloric intake, while maintaining appropriate intake of nutrient and fiber, sodium and fats, and appropriate energy expenditure required for the weight goal.;Weight Management: Provide education and appropriate resources to help participant work on and attain dietary goals.;Weight Management/Obesity: Establish reasonable short term  and long term weight goals.;Obesity: Provide education and appropriate resources to help participant work on and attain dietary goals.    Expected Outcomes Short Term: Continue to assess and modify interventions until short term weight is achieved;Long Term: Adherence to nutrition and physical  activity/exercise program aimed toward attainment of established weight goal;Weight Loss: Understanding of general recommendations for a balanced deficit meal plan, which promotes 1-2 lb weight loss per week and includes a negative energy balance of [phone] kcal/d;Understanding recommendations for meals to include 15-35% energy as protein, 25-35% energy from fat, 35-60% energy from carbohydrates, less than 200mg  of dietary cholesterol, 20-35 gm of total fiber daily;Understanding of distribution of calorie intake throughout the day with the consumption of 4-5 meals/snacks    Improve shortness of breath with ADL's Yes    Intervention Provide education, individualized exercise plan and daily activity instruction to help decrease symptoms of SOB with activities of daily living.    Expected Outcomes Short Term: Improve cardiorespiratory fitness to achieve a reduction of symptoms when performing ADLs;Long Term: Be able to perform more ADLs without symptoms or delay the onset of symptoms    Heart Failure Yes    Intervention Provide a combined exercise and nutrition program that is supplemented with education, support and counseling about heart failure. Directed toward relieving symptoms such as shortness of breath, decreased exercise tolerance, and extremity edema.    Expected Outcomes Improve functional capacity of life;Short term: Attendance in program 2-3 days a week with increased exercise capacity. Reported lower sodium intake. Reported increased fruit and vegetable intake. Reports medication compliance.;Short term: Daily weights obtained and reported for increase. Utilizing diuretic protocols set by physician.;Long term: Adoption of self-care skills and reduction of barriers for early signs and symptoms recognition and intervention leading to self-care maintenance.          Core Components/Risk Factors/Patient Goals Review:   Goals and Risk Factor Review     Row Name 04/20/24 1046 05/13/24 1439  06/16/24 0834         Core Components/Risk Factors/Patient Goals Review   Personal Goals Review Weight Management/Obesity;Improve shortness of breath with ADL's;Heart Failure Weight Management/Obesity;Improve shortness of breath with ADL's;Heart Failure;Develop more efficient breathing techniques such as purse lipped breathing and diaphragmatic breathing and practicing self-pacing with activity. Weight Management/Obesity;Improve shortness of breath with ADL's;Heart Failure;Develop more efficient breathing techniques such as purse lipped breathing and diaphragmatic breathing and practicing self-pacing with activity.     Review Monthly review of patient's Core Components/Risk Factors/Patient Goals are as follows:  Unable to assess goals yet. [place] is schedule to start the program next week Monthly review of patient's Core Components/Risk Factors/Patient Goals are as follows:  Goal in progress for improving her shortness of breath with ADLs. Emily Wheeler is trying to build up her strength and endurance. She has completed 4 sessions so far and is exercising on the NuStep. She is trying to build up her stamina to start walking the track. Her oxygen saturation has been stable on room air. Goal progressing for developing more efficient breathing techniques such as purse lipped breathing and diaphragmatic breathing; and practicing self-pacing with activity. Emily Wheeler needs to be prompted to perform purse lipped breathing while short of breath. We are working on this with her while performing the warmup and while exercising. She is working on diaphragmatic breathing at home. Goal progressing on weight loss. Emily Wheeler is working with our dietitian on choosing a wide variety of foods and incorporating more fruits and vegetables to lose weight. She  is currently down ~9.5# since beginning the program. Goal progressing on reducing heart failure exacerbations and reducing signs and symptoms. Emily Wheeler is medication compliant, weighs  herself daily and follows a low sodium diet. Emily Wheeler will continue to benefit from PR for nutrition, education, exercise, and lifestyle modification. Monthly review of patient's Core Components/Risk Factors/Patient Goals are as follows: Goal in progress for improving her shortness of breath with ADLs. Suheyla is trying to build up her strength and endurance. She is exercising on the NuStep and recently started walking the track. Her oxygen saturation has been stable on room air. Goal met for developing more efficient breathing techniques such as purse lipped breathing and diaphragmatic breathing; and practicing self-pacing with activity. Tosha can initiate purse lipped breathing while short of breath. She demonstrates this while performing the warmup and while exercising. She is working on diaphragmatic breathing at home. Goal progressing on weight loss. Emily Wheeler is working with our dietitian on choosing a wide variety of foods and incorporating more fruits and vegetables to lose weight. Goal progressing on reducing heart failure exacerbations and reducing signs and symptoms. Emily Wheeler is medication compliant, weighs herself daily and follows a low sodium diet. Emily Wheeler will continue to benefit from PR for nutrition, education, exercise, and lifestyle modification.     Expected Outcomes Pt will show progress toward meeting expected goals and outcomes. Pt will show progress toward meeting expected goals and outcomes. Pt will show progress toward meeting expected goals and outcomes.        Core Components/Risk Factors/Patient Goals at Discharge (Final Review):   Goals and Risk Factor Review - 06/16/24 0834       Core Components/Risk Factors/Patient Goals Review   Personal Goals Review Weight Management/Obesity;Improve shortness of breath with ADL's;Heart Failure;Develop more efficient breathing techniques such as purse lipped breathing and diaphragmatic breathing and practicing self-pacing with activity.     Review Monthly review of patient's Core Components/Risk Factors/Patient Goals are as follows: Goal in progress for improving her shortness of breath with ADLs. Emily Wheeler is trying to build up her strength and endurance. She is exercising on the NuStep and recently started walking the track. Her oxygen saturation has been stable on room air. Goal met for developing more efficient breathing techniques such as purse lipped breathing and diaphragmatic breathing; and practicing self-pacing with activity. Emily Wheeler can initiate purse lipped breathing while short of breath. She demonstrates this while performing the warmup and while exercising. She is working on diaphragmatic breathing at home. Goal progressing on weight loss. Emily Wheeler is working with our dietitian on choosing a wide variety of foods and incorporating more fruits and vegetables to lose weight. Goal progressing on reducing heart failure exacerbations and reducing signs and symptoms. Emily Wheeler is medication compliant, weighs herself daily and follows a low sodium diet. Emily Wheeler will continue to benefit from PR for nutrition, education, exercise, and lifestyle modification.    Expected Outcomes Pt will show progress toward meeting expected goals and outcomes.          ITP Comments:   Comments: Pt is making expected progress toward Pulmonary Rehab goals after completing 11 session(s). Recommend continued exercise, life style modification, education, and utilization of breathing techniques to increase stamina and strength, while also decreasing shortness of breath with exertion.  Dr. Slater Staff is Medical Director for Pulmonary Rehab at [facility].       [1]  Current Outpatient Medications:    Accu-Chek FastClix Lancets MISC, USE 1  TO CHECK GLUCOSE THREE TIMES DAILY, Disp:  306 each, Rfl: 0   allopurinol  (ZYLOPRIM ) 300 MG tablet, Take 1 tablet by mouth once daily, Disp: 30 tablet, Rfl: 0   aspirin  EC 81 MG tablet, Take 1 tablet (81 mg  total) by mouth daily., Disp: 90 tablet, Rfl: 1   atorvastatin  (LIPITOR) 80 MG tablet, Take 1 tablet (80 mg total) by mouth daily., Disp: 90 tablet, Rfl: 3   Blood Glucose Monitoring Suppl (ACCU-CHEK GUIDE) w/Device KIT, 1 Act by Does not apply route 3 (three) times daily., Disp: 2 kit, Rfl: 2   calcium  carbonate (OS-CAL) 600 MG TABS tablet, Take 600 mg by mouth daily., Disp: , Rfl:    carvedilol  (COREG ) 12.5 MG tablet, TAKE 1 TABLET BY MOUTH TWICE DAILY. TAKE AN EXTRA 1/2 (ONE-HALF) TABLET AS NEEDED FOR PALPITATIONS, Disp: 225 tablet, Rfl: 0   citalopram  (CELEXA ) 20 MG tablet, Take 1 tablet by mouth once daily, Disp: 90 tablet, Rfl: 0   esomeprazole  (NEXIUM ) 40 MG capsule, Take 1 capsule (40 mg total) by mouth 2 (two) times daily., Disp: 180 capsule, Rfl: 0   FARXIGA  10 MG TABS tablet, TAKE 1 TABLET BY MOUTH ONCE DAILY BEFORE BREAKFAST, Disp: 30 tablet, Rfl: 0   Finerenone  (KERENDIA ) 10 MG TABS, Take 1 tablet (10 mg total) by mouth daily. (Patient not taking: Reported on 04/17/2024), Disp: 90 tablet, Rfl: 0   furosemide  (LASIX ) 40 MG tablet, Take 1 tablet (40 mg total) by mouth as needed. For weight gain of 3lb in 24 hours, 5lb in a week, Disp: 90 tablet, Rfl: 3   gabapentin  (NEURONTIN ) 600 MG tablet, Take 0.5 tablets (300 mg total) by mouth 3 (three) times daily., Disp: 270 tablet, Rfl: 0   glucose blood (ACCU-CHEK GUIDE) test strip, 1 each by Other route 3 (three) times daily., Disp: 300 each, Rfl: 1   leflunomide  (ARAVA ) 20 MG tablet, Take 1 tablet by mouth once daily, Disp: 30 tablet, Rfl: 0   levalbuterol  (XOPENEX  HFA) 45 MCG/ACT inhaler, Inhale 2 puffs into the lungs every 8 (eight) hours as needed for wheezing., Disp: 3 each, Rfl: 3   levocetirizine (XYZAL ) 5 MG tablet, Take 1 tablet (5 mg total) by mouth every evening for 14 days. (Patient not taking: Reported on 04/17/2024), Disp: 14 tablet, Rfl: 0   Omega-3 Fatty Acids (FISH OIL) 500 MG CAPS, Take 500 mg by mouth daily., Disp: , Rfl:     OZEMPIC , 1 MG/DOSE, 4 MG/3ML SOPN, INJECT 1 MG SUBCUTANEOUSLY  ONCE A WEEK, Disp: 9 mL, Rfl: 0   Polyethylene Glycol 400 (BLINK TEARS) 0.25 % SOLN, Place 1 drop into both eyes daily as needed (Dry eyes)., Disp: , Rfl:    RELION PEN NEEDLES 32G X 4 MM MISC, USE 1  SUBCUTANEOUSLY ONCE A WEEK, Disp: 50 each, Rfl: 0   sacubitril -valsartan  (ENTRESTO ) 49-51 MG, Take 1 tablet by mouth 2 (two) times daily. NEEDS APPOINTMENT FOR REFILLS, Disp: 60 tablet, Rfl: 0 [2]  Social History Tobacco Use  Smoking Status Former   Passive exposure: Current  Smokeless Tobacco Never

## 2024-06-18 ENCOUNTER — Encounter (HOSPITAL_COMMUNITY)
Admission: RE | Admit: 2024-06-18 | Discharge: 2024-06-18 | Disposition: A | Discharge status: Home / Self Care | Source: Ambulatory Visit | Attending: Cardiology | Admitting: Cardiology

## 2024-06-18 VITALS — Wt 219.4 lb

## 2024-06-18 DIAGNOSIS — I5022 Chronic systolic (congestive) heart failure: Secondary | ICD-10-CM

## 2024-06-18 NOTE — Progress Notes (Signed)
 Daily Session Note  Patient Details  Name: Emily Wheeler MRN: [id number] Date of Birth: April 12, 1956 Referring Provider:   [name] [facility] Re[facility] Walk Test from 04/17/2024 in [facility]  Referring Provider [name]    Encounter Date: 06/18/2024  Check In:  Session Check In - 06/18/24 1342       Check-In   Supervising physician immediately available to respond to emergencies [facility] MD immediately available    Physician(s) Barnie Press, NP    Location [facility]    Staff Present Ronal Levin, RN, BSN;Arilynn Blakeney Midge BS, ACSM-CEP, Exercise Physiologist;Kaylee Nicholaus, MS, ACSM-CEP, Exercise Physiologist;Casey Claudene, RT    Virtual Visit No    Medication changes reported     No    Fall or balance concerns reported    Yes    Comments pt uses cane to walk    Tobacco Cessation No Change    Warm-up and Cool-down Performed as group-led instruction    Resistance Training Performed Yes    VAD Patient? No    PAD/SET Patient? No      Pain Assessment   Currently in Pain? No/denies    Multiple Pain Sites No          Capillary Blood Glucose: No results found for this or any previous visit (from the past 24 hours).    Tobacco Use History[1]  Goals Met:  Proper associated with RPD/PD & O2 Sat Independence with exercise equipment Exercise tolerated well No report of concerns or symptoms today Strength training completed today  Goals Unmet:  Not Applicable  Comments: Service time is from 1300 to 1447.    Dr. Slater Staff is Medical Director for Pulmonary Rehab at [facility].     [1]  Social History Tobacco Use  Smoking Status Former   Passive exposure: Current  Smokeless Tobacco Never

## 2024-06-23 ENCOUNTER — Encounter (HOSPITAL_COMMUNITY)

## 2024-06-23 ENCOUNTER — Telehealth (HOSPITAL_COMMUNITY): Payer: Self-pay | Admitting: *Deleted

## 2024-06-23 NOTE — Telephone Encounter (Signed)
 Pt LVM stating she was unable to make it to Pulmonary Rehab today due to the icy conditions.  Aliene Aris BS, ACSM-CEP 06/23/2024 12:44 PM

## 2024-06-25 ENCOUNTER — Telehealth (HOSPITAL_COMMUNITY): Payer: Self-pay

## 2024-06-25 ENCOUNTER — Encounter (HOSPITAL_COMMUNITY): Admission: RE | Admit: 2024-06-25 | Source: Ambulatory Visit

## 2024-06-25 NOTE — Telephone Encounter (Signed)
 Patient c/o for 1:15 PR class due to elevator being out.

## 2024-06-25 NOTE — Telephone Encounter (Signed)
 Called and LVM that our elevators are not working at this time. Suggested that she park on the second level and take the stairs if she feels safe to do that.

## 2024-06-30 ENCOUNTER — Encounter (HOSPITAL_COMMUNITY)

## 2024-07-02 ENCOUNTER — Encounter (HOSPITAL_COMMUNITY): Admission: RE | Admit: 2024-07-02

## 2024-07-02 ENCOUNTER — Other Ambulatory Visit (HOSPITAL_COMMUNITY): Payer: Self-pay | Admitting: Cardiology

## 2024-07-02 DIAGNOSIS — K2100 Gastro-esophageal reflux disease with esophagitis, without bleeding: Secondary | ICD-10-CM

## 2024-07-03 ENCOUNTER — Other Ambulatory Visit: Payer: Self-pay

## 2024-07-03 ENCOUNTER — Telehealth (HOSPITAL_COMMUNITY): Payer: Self-pay

## 2024-07-03 ENCOUNTER — Encounter: Payer: Self-pay | Admitting: Family Medicine

## 2024-07-03 ENCOUNTER — Ambulatory Visit: Admitting: Family Medicine

## 2024-07-03 ENCOUNTER — Other Ambulatory Visit: Payer: Self-pay | Admitting: Physician Assistant

## 2024-07-03 VITALS — BP 108/68 | HR 77 | Ht 64.0 in | Wt 216.0 lb

## 2024-07-03 DIAGNOSIS — M7061 Trochanteric bursitis, right hip: Secondary | ICD-10-CM

## 2024-07-03 DIAGNOSIS — M170 Bilateral primary osteoarthritis of knee: Secondary | ICD-10-CM

## 2024-07-03 NOTE — Progress Notes (Signed)
 " [facility] [address] [place] [address] Phone: [phone] Subjective:   Emily Wheeler, am serving as a scribe for Dr. Arthea Wheeler.  I'm seeing this patient by the request  of:  Emily Debby CROME, MD  CC: Bilateral hip pain and other pain  YEP:Dlagzrupcz  Emily Wheeler is a 69 y.o. female coming in with complaint of B hip pain. R knee pain. Wondering if its coming from the hip. May also need knee injection.       Past Medical History:  Diagnosis Date   Anemia    mild   Anxiety    Arthritis    Asthma    Cardiomyopathy (HCC)    CHF (congestive heart failure) (HCC)    Diabetes mellitus without complication (HCC)    Frequency of urination    at night   GERD (gastroesophageal reflux disease)    potassium worsens reflux   Hemorrhoids, external    Hyperlipidemia    Hypertension    Neuromuscular disorder (HCC)    neuropathy   Numbness and tingling of both legs    Rheumatoid arthritis (HCC)    Vertigo    hx of   Past Surgical History:  Procedure Laterality Date   ABDOMINAL HYSTERECTOMY     CARDIAC CATHETERIZATION N/A 10/28/2014   Procedure: Left Heart Cath and Coronary Angiography;  Surgeon: Candyce GORMAN Reek, MD;  Location: [facility] INVASIVE CV LAB;  Service: Cardiovascular;  Laterality: N/A;   CARPAL TUNNEL RELEASE Left    COLONOSCOPY     pt states 11 yr ago in [place] had a colon with 2 polyps- one cecal polyp per pt. one ? location   COLONOSCOPY W/ POLYPECTOMY     CYST REMOVAL NECK     HEMORROIDECTOMY     MAXIMUM ACCESS (MAS)POSTERIOR LUMBAR INTERBODY FUSION (PLIF) 3 LEVEL N/A 10/08/2013   Procedure: FOR MAXIMUM ACCESS (MAS) POSTERIOR LUMBAR INTERBODY FUSION (PLIF) 3 LEVEL;  Surgeon: Fairy Levels, MD;  Location: [facility] NEURO ORS;  Service: Neurosurgery;  Laterality: N/A;  L3-4 L4-5 L5-S1 maximum access posterior lumbar interbody fusion with decompression   RIGHT HEART CATH N/A 08/21/2023   Procedure: RIGHT HEART CATH;  Surgeon: [name] Morene PARAS, MD;  Location: [facility] INVASIVE CV LAB;  Service: Cardiovascular;  Laterality: N/A;   TUBAL LIGATION     Social History   Socioeconomic History   Marital status: Single    Spouse name: Not on file   Number of children: 2   Years of education: Not on file   Highest education level: GED or equivalent  Occupational History   Occupation: RETIRED  Tobacco Use   Smoking status: Former    Passive exposure: Current   Smokeless tobacco: Never  Vaping Use   Vaping status: Never Used  Substance and Sexual Activity   Alcohol use: Not Currently   Drug use: No   Sexual activity: Not Currently  Other Topics Concern   Not on file  Social History Narrative   Lives alone/2025   Social Drivers of Health   Tobacco Use: Medium Risk (04/17/2024)   Patient History    Smoking Tobacco Use: Former    Smokeless Tobacco Use: Never    Passive Exposure: Current  Physicist, Medical Strain: Medium Risk (01/06/2024)   Overall Financial Resource Strain (CARDIA)    Difficulty of Paying Living Expenses: Somewhat hard  Food Insecurity: No Food Insecurity (01/06/2024)   Epic    Worried About Programme Researcher, Broadcasting/film/video in  the Last Year: Never true    Ran Out of Food in the Last Year: Never true  Transportation Needs: No Transportation Needs (01/06/2024)   Epic    Lack of Transportation (Medical): No    Lack of Transportation (Non-Medical): No  Physical Activity: Inactive (01/06/2024)   Exercise Vital Sign    Days of Exercise per Week: 0 days    Minutes of Exercise per Session: Not on file  Stress: No Stress Concern Present (01/06/2024)   [name] of Occupational Health - Occupational Stress Questionnaire    Feeling of Stress: Not at all  Social Connections: Moderately Isolated (01/06/2024)   Social Connection and Isolation Panel    Frequency of Communication with Friends and Family: More than three times a week    Frequency of Social Gatherings with Friends and Family: Once a week    Attends  Religious Services: Patient declined    Active Member of Clubs or Organizations: Yes    Attends Banker Meetings: Patient declined    Marital Status: Never married  Depression (PHQ2-9): Low Risk (04/17/2024)   Depression (PHQ2-9)    PHQ-2 Score: 0  Alcohol Screen: Low Risk (07/31/2023)   Alcohol Screen    Last Alcohol Screening Score (AUDIT): 0  Housing: High Risk (01/06/2024)   Epic    Unable to Pay for Housing in the Last Year: Yes    Number of Times Moved in the Last Year: 0    Homeless in the Last Year: No  Utilities: Not At Risk (07/31/2023)   AHC Utilities    Threatened with loss of utilities: No  Health Literacy: Adequate Health Literacy (07/31/2023)   B1300 Health Literacy    Frequency of need for help with medical instructions: Never   Allergies[1] Family History  Problem Relation Age of Onset   Early death Father    Heart disease Father    Hypertension Sister    Hypertension Brother    Diabetes Brother    Colon polyps Mother    Hashimoto's thyroiditis Sister    Colon cancer Maternal Grandmother        in her 13s   Stomach cancer Maternal Aunt    Non-Hodgkin's lymphoma Sister    Alcohol abuse Neg Hx    COPD Neg Hx    Depression Neg Hx    Drug abuse Neg Hx    Hearing loss Neg Hx    Hyperlipidemia Neg Hx    Kidney disease Neg Hx    Stroke Neg Hx    Esophageal cancer Neg Hx    Rectal cancer Neg Hx     Current Outpatient Medications (Endocrine & Metabolic):    FARXIGA  10 MG TABS tablet, TAKE 1 TABLET BY MOUTH ONCE DAILY BEFORE BREAKFAST   Finerenone  (KERENDIA ) 10 MG TABS, Take 1 tablet (10 mg total) by mouth daily. (Patient not taking: Reported on 04/17/2024)   OZEMPIC , 1 MG/DOSE, 4 MG/3ML SOPN, INJECT 1 MG SUBCUTANEOUSLY  ONCE A WEEK  Current Outpatient Medications (Cardiovascular):    atorvastatin  (LIPITOR) 80 MG tablet, Take 1 tablet (80 mg total) by mouth daily.   carvedilol  (COREG ) 12.5 MG tablet, TAKE 1 TABLET BY MOUTH TWICE DAILY. TAKE AN EXTRA  1/2 (ONE-HALF) TABLET AS NEEDED FOR PALPITATIONS   furosemide  (LASIX ) 40 MG tablet, Take 1 tablet (40 mg total) by mouth as needed. For weight gain of 3lb in 24 hours, 5lb in a week   sacubitril -valsartan  (ENTRESTO ) 49-51 MG, Take 1 tablet by mouth 2 (two) times  daily. NEEDS APPOINTMENT FOR REFILLS  Current Outpatient Medications (Respiratory):    levalbuterol  (XOPENEX  HFA) 45 MCG/ACT inhaler, Inhale 2 puffs into the lungs every 8 (eight) hours as needed for wheezing.   levocetirizine (XYZAL ) 5 MG tablet, Take 1 tablet (5 mg total) by mouth every evening for 14 days. (Patient not taking: Reported on 04/17/2024)  Current Outpatient Medications (Analgesics):    allopurinol  (ZYLOPRIM ) 300 MG tablet, Take 1 tablet by mouth once daily   aspirin  EC 81 MG tablet, Take 1 tablet (81 mg total) by mouth daily.   leflunomide  (ARAVA ) 20 MG tablet, Take 1 tablet by mouth once daily  Current Outpatient Medications (Other):    Accu-Chek FastClix Lancets MISC, USE 1  TO CHECK GLUCOSE THREE TIMES DAILY   Blood Glucose Monitoring Suppl (ACCU-CHEK GUIDE) w/Device KIT, 1 Act by Does not apply route 3 (three) times daily.   calcium  carbonate (OS-CAL) 600 MG TABS tablet, Take 600 mg by mouth daily.   citalopram  (CELEXA ) 20 MG tablet, Take 1 tablet by mouth once daily   esomeprazole  (NEXIUM ) 40 MG capsule, Take 1 capsule by mouth twice daily   gabapentin  (NEURONTIN ) 600 MG tablet, Take 0.5 tablets (300 mg total) by mouth 3 (three) times daily.   glucose blood (ACCU-CHEK GUIDE) test strip, 1 each by Other route 3 (three) times daily.   Omega-3 Fatty Acids (FISH OIL) 500 MG CAPS, Take 500 mg by mouth daily.   Polyethylene Glycol 400 (BLINK TEARS) 0.25 % SOLN, Place 1 drop into both eyes daily as needed (Dry eyes).   RELION PEN NEEDLES 32G X 4 MM MISC, USE 1  SUBCUTANEOUSLY ONCE A WEEK   Reviewed prior external information including notes and imaging from  primary care provider As well as notes that were available  from care everywhere and other healthcare systems.  Past medical history, social, surgical and family history all reviewed in electronic medical record.  No pertanent information unless stated regarding to the chief complaint.   Review of Systems:  No headache, visual changes, nausea, vomiting, diarrhea, constipation, dizziness, abdominal pain, skin rash, fevers, chills, night sweats, weight loss, swollen lymph nodes, body aches, joint swelling, chest pain, shortness of breath, mood changes. POSITIVE muscle aches  Objective  Blood pressure 108/68, pulse 77, height 5' 4 (1.626 m), weight 216 lb (98 kg), last menstrual period 05/29/1991, SpO2 98%.   General: No apparent distress alert and oriented x3 mood and affect normal, dressed appropriately.  HEENT: Pupils equal, extraocular movements intact  Respiratory: Patient's speak in full sentences and does not appear short of breath  Cardiovascular: No lower extremity edema, non tender, no erythema  Patient has severe difficulty with antalgic gait noted.  Weakness of the hip girdle noted.  Severe tenderness over the greater trochanteric areas bilaterally.  No pain over the gluteal tendons as well. Right knee trace effusion noted.  Moderate swelling.  Limited range of motion in flexion.   Procedure: Real-time Ultrasound Guided Injection of right greater trochanteric bursitis secondary to patient's body habitus Device: GE Logiq Q7 Ultrasound guided injection is preferred based studies that show increased duration, increased effect, greater accuracy, decreased procedural pain, increased response rate, and decreased cost with ultrasound guided versus blind injection.  Verbal informed consent obtained.  Time-out conducted.  Noted no overlying erythema, induration, or other signs of local infection.  Skin prepped in a sterile fashion.  Local anesthesia: Topical Ethyl chloride.  With sterile technique and under real time ultrasound guidance:  Greater  trochanteric  area was visualized and patient's bursa was noted. A 22-gauge 3 inch needle was inserted and 4 cc of 0.5% Marcaine  and 1 cc of Kenalog  40 mg/dL was injected. Pictures taken Completed without difficulty  Pain immediately resolved suggesting accurate placement of the medication.  Advised to call if fevers/chills, erythema, induration, drainage, or persistent bleeding.  Images permanently stored  Impression: Technically successful ultrasound guided injection.   Procedure: Real-time Ultrasound Guided Injection of left  greater trochanteric bursitis secondary to patient's body habitus Device: GE Logiq Q7  Ultrasound guided injection is preferred based studies that show increased duration, increased effect, greater accuracy, decreased procedural pain, increased response rate, and decreased cost with ultrasound guided versus blind injection.  Verbal informed consent obtained.  Time-out conducted.  Noted no overlying erythema, induration, or other signs of local infection.  Skin prepped in a sterile fashion.  Local anesthesia: Topical Ethyl chloride.  With sterile technique and under real time ultrasound guidance:  Greater trochanteric area was visualized and patient's bursa was noted. A 22-gauge 3 inch needle was inserted and 4 cc of 0.5% Marcaine  and 1 cc of Kenalog  40 mg/dL was injected. Pictures taken Completed without difficulty  Pain immediately resolved suggesting accurate placement of the medication.  Advised to call if fevers/chills, erythema, induration, drainage, or persistent bleeding.  Impression: Technically successful ultrasound guided injection.   After informed written and verbal consent, patient was seated on exam table. Right knee was prepped with alcohol swab and utilizing superior lateral approach, patient's right knee space was injected with 4:1  marcaine  0.5%: Kenalog  40mg /dL. Patient tolerated the procedure well without immediate complications. Impression and  Recommendations:     The above documentation has been reviewed and is accurate and complete Emily CHRISTELLA Sharps, DO       [1]  Allergies Allergen Reactions   Lisinopril  Cough   "

## 2024-07-03 NOTE — Assessment & Plan Note (Signed)
 Chronic problem but effusion on the right side.  Fairly severe overall.  Patient had some aspiration done today as well.  Hopefully this does help.  Follow-up again in 6 to 12 weeks

## 2024-07-03 NOTE — Telephone Encounter (Signed)
 Lya called back and stated she tore a tendon in her hip and is healing from that which is why she hasn't been to PR class, but she will hopefully be back in next week.

## 2024-07-03 NOTE — Patient Instructions (Signed)
 Injections in B hips and R knee today Good to see you! See you again in 10 weeks

## 2024-07-03 NOTE — Assessment & Plan Note (Signed)
 Chronic problem with exacerbation.  Not a surgical candidate, some of the weakness comes from lumbar radiculopathy.  Patient has had the replacements.  Follow-up again in 6 to 12 weeks otherwise.

## 2024-07-03 NOTE — Telephone Encounter (Signed)
 Called pt to check on her since she has missed multiple PR sessions. No answer. LVM.

## 2024-07-07 ENCOUNTER — Encounter (HOSPITAL_COMMUNITY)

## 2024-07-09 ENCOUNTER — Encounter (HOSPITAL_COMMUNITY)

## 2024-07-09 ENCOUNTER — Ambulatory Visit: Admitting: Gastroenterology

## 2024-07-14 ENCOUNTER — Encounter (HOSPITAL_COMMUNITY)

## 2024-07-15 ENCOUNTER — Ambulatory Visit: Admitting: Internal Medicine

## 2024-07-16 ENCOUNTER — Encounter (HOSPITAL_COMMUNITY)

## 2024-07-21 ENCOUNTER — Encounter (HOSPITAL_COMMUNITY)

## 2024-07-23 ENCOUNTER — Encounter (HOSPITAL_COMMUNITY)

## 2024-07-28 ENCOUNTER — Encounter (HOSPITAL_COMMUNITY)

## 2024-07-30 ENCOUNTER — Encounter (HOSPITAL_COMMUNITY)

## 2024-07-31 ENCOUNTER — Ambulatory Visit

## 2024-08-04 ENCOUNTER — Encounter (HOSPITAL_COMMUNITY)

## 2024-08-06 ENCOUNTER — Encounter (HOSPITAL_COMMUNITY)

## 2024-08-11 ENCOUNTER — Encounter (HOSPITAL_COMMUNITY)

## 2024-08-13 ENCOUNTER — Encounter (HOSPITAL_COMMUNITY)

## 2024-09-11 ENCOUNTER — Ambulatory Visit: Admitting: Family Medicine
